# Patient Record
Sex: Female | Born: 1960 | Race: White | Hispanic: No | Marital: Married | Smoking: Never smoker
Health system: Southern US, Community
[De-identification: ages and names within clinical notes are randomized; demographics above are authoritative.]

## PROBLEM LIST (undated history)

## (undated) DIAGNOSIS — M75102 Unspecified rotator cuff tear or rupture of left shoulder, not specified as traumatic: Secondary | ICD-10-CM

## (undated) DIAGNOSIS — G4700 Insomnia, unspecified: Secondary | ICD-10-CM

## (undated) DIAGNOSIS — K589 Irritable bowel syndrome without diarrhea: Secondary | ICD-10-CM

## (undated) DIAGNOSIS — G629 Polyneuropathy, unspecified: Secondary | ICD-10-CM

## (undated) DIAGNOSIS — F1120 Opioid dependence, uncomplicated: Secondary | ICD-10-CM

## (undated) DIAGNOSIS — F1920 Other psychoactive substance dependence, uncomplicated: Secondary | ICD-10-CM

## (undated) DIAGNOSIS — T7840XA Allergy, unspecified, initial encounter: Secondary | ICD-10-CM

## (undated) DIAGNOSIS — Z9889 Other specified postprocedural states: Secondary | ICD-10-CM

## (undated) DIAGNOSIS — E785 Hyperlipidemia, unspecified: Secondary | ICD-10-CM

## (undated) DIAGNOSIS — R000 Tachycardia, unspecified: Secondary | ICD-10-CM

## (undated) DIAGNOSIS — G252 Other specified forms of tremor: Secondary | ICD-10-CM

## (undated) DIAGNOSIS — E1143 Type 2 diabetes mellitus with diabetic autonomic (poly)neuropathy: Secondary | ICD-10-CM

## (undated) DIAGNOSIS — I951 Orthostatic hypotension: Secondary | ICD-10-CM

## (undated) DIAGNOSIS — E041 Nontoxic single thyroid nodule: Secondary | ICD-10-CM

## (undated) DIAGNOSIS — Z9289 Personal history of other medical treatment: Secondary | ICD-10-CM

## (undated) DIAGNOSIS — R251 Tremor, unspecified: Secondary | ICD-10-CM

## (undated) DIAGNOSIS — G2401 Drug induced subacute dyskinesia: Secondary | ICD-10-CM

## (undated) DIAGNOSIS — M47812 Spondylosis without myelopathy or radiculopathy, cervical region: Secondary | ICD-10-CM

## (undated) DIAGNOSIS — I498 Other specified cardiac arrhythmias: Secondary | ICD-10-CM

## (undated) DIAGNOSIS — Z5189 Encounter for other specified aftercare: Secondary | ICD-10-CM

## (undated) DIAGNOSIS — G20 Parkinson's disease: Secondary | ICD-10-CM

## (undated) DIAGNOSIS — G90A Postural orthostatic tachycardia syndrome (POTS): Secondary | ICD-10-CM

## (undated) DIAGNOSIS — R259 Unspecified abnormal involuntary movements: Secondary | ICD-10-CM

## (undated) DIAGNOSIS — G249 Dystonia, unspecified: Secondary | ICD-10-CM

## (undated) DIAGNOSIS — M5410 Radiculopathy, site unspecified: Secondary | ICD-10-CM

## (undated) DIAGNOSIS — F329 Major depressive disorder, single episode, unspecified: Secondary | ICD-10-CM

## (undated) DIAGNOSIS — M47819 Spondylosis without myelopathy or radiculopathy, site unspecified: Secondary | ICD-10-CM

## (undated) DIAGNOSIS — F32A Depression, unspecified: Secondary | ICD-10-CM

## (undated) DIAGNOSIS — Z90711 Acquired absence of uterus with remaining cervical stump: Secondary | ICD-10-CM

## (undated) DIAGNOSIS — F419 Anxiety disorder, unspecified: Secondary | ICD-10-CM

## (undated) DIAGNOSIS — E042 Nontoxic multinodular goiter: Secondary | ICD-10-CM

## (undated) DIAGNOSIS — F319 Bipolar disorder, unspecified: Secondary | ICD-10-CM

## (undated) DIAGNOSIS — G20C Parkinsonism, unspecified: Secondary | ICD-10-CM

## (undated) DIAGNOSIS — G901 Familial dysautonomia [Riley-Day]: Secondary | ICD-10-CM

## (undated) HISTORY — DX: Drug induced subacute dyskinesia: G24.01

## (undated) HISTORY — DX: Other psychoactive substance dependence, uncomplicated: F19.20

## (undated) HISTORY — DX: Acquired absence of uterus with remaining cervical stump: Z90.711

## (undated) HISTORY — DX: Familial dysautonomia (riley-day): G90.1

## (undated) HISTORY — DX: Spondylosis without myelopathy or radiculopathy, cervical region: M47.812

## (undated) HISTORY — DX: Unspecified rotator cuff tear or rupture of left shoulder, not specified as traumatic: M75.102

## (undated) HISTORY — DX: Spondylosis without myelopathy or radiculopathy, site unspecified: M47.819

## (undated) HISTORY — DX: Encounter for other specified aftercare: Z51.89

## (undated) HISTORY — DX: Radiculopathy, site unspecified: M54.10

## (undated) HISTORY — DX: Orthostatic hypotension: I95.1

## (undated) HISTORY — DX: Parkinsonism, unspecified: G20.C

## (undated) HISTORY — PX: BREAST LUMPECTOMY: SHX2

## (undated) HISTORY — DX: Personal history of other medical treatment: Z92.89

## (undated) HISTORY — PX: SHOULDER SURGERY: SHX246

## (undated) HISTORY — DX: Anxiety disorder, unspecified: F41.9

## (undated) HISTORY — DX: Postural orthostatic tachycardia syndrome (POTS): G90.A

## (undated) HISTORY — DX: Nontoxic single thyroid nodule: E04.1

## (undated) HISTORY — DX: Depression, unspecified: F32.A

## (undated) HISTORY — DX: Other specified cardiac arrhythmias: I49.8

## (undated) HISTORY — DX: Hyperlipidemia, unspecified: E78.5

## (undated) HISTORY — DX: Tachycardia, unspecified: R00.0

## (undated) HISTORY — DX: Type 2 diabetes mellitus with diabetic autonomic (poly)neuropathy: E11.43

## (undated) HISTORY — DX: Polyneuropathy, unspecified: G62.9

## (undated) HISTORY — DX: Nontoxic multinodular goiter: E04.2

## (undated) HISTORY — DX: Parkinson's disease: G20

## (undated) HISTORY — DX: Tremor, unspecified: R25.1

## (undated) HISTORY — DX: Other specified forms of tremor: G25.2

## (undated) HISTORY — DX: Dystonia, unspecified: G24.9

## (undated) HISTORY — DX: Allergy, unspecified, initial encounter: T78.40XA

## (undated) HISTORY — PX: BREAST EXCISIONAL BIOPSY: SUR124

## (undated) HISTORY — DX: Opioid dependence, uncomplicated: F11.20

## (undated) HISTORY — PX: PARTIAL HYSTERECTOMY: SHX80

## (undated) HISTORY — DX: Irritable bowel syndrome without diarrhea: K58.9

## (undated) HISTORY — PX: OOPHORECTOMY: SHX86

## (undated) HISTORY — DX: Insomnia, unspecified: G47.00

## (undated) HISTORY — DX: Bipolar disorder, unspecified: F31.9

## (undated) HISTORY — DX: Unspecified abnormal involuntary movements: R25.9

## (undated) HISTORY — DX: Major depressive disorder, single episode, unspecified: F32.9

## (undated) HISTORY — PX: OTHER SURGICAL HISTORY: SHX169

---

## 1990-02-10 HISTORY — PX: OTHER SURGICAL HISTORY: SHX169

## 1997-08-16 ENCOUNTER — Ambulatory Visit (HOSPITAL_COMMUNITY): Admission: RE | Admit: 1997-08-16 | Discharge: 1997-08-16 | Payer: Self-pay | Admitting: *Deleted

## 1998-08-23 ENCOUNTER — Other Ambulatory Visit: Admission: RE | Admit: 1998-08-23 | Discharge: 1998-08-23 | Payer: Self-pay | Admitting: Obstetrics and Gynecology

## 2000-02-11 HISTORY — PX: ACROMIOPLASTY: SHX131

## 2000-02-21 ENCOUNTER — Encounter: Payer: Self-pay | Admitting: Emergency Medicine

## 2000-02-21 ENCOUNTER — Emergency Department (HOSPITAL_COMMUNITY): Admission: EM | Admit: 2000-02-21 | Discharge: 2000-02-21 | Payer: Self-pay | Admitting: Emergency Medicine

## 2000-02-24 ENCOUNTER — Encounter: Admission: RE | Admit: 2000-02-24 | Discharge: 2000-02-24 | Payer: Self-pay | Admitting: Family Medicine

## 2000-02-24 ENCOUNTER — Encounter: Payer: Self-pay | Admitting: Family Medicine

## 2000-03-18 ENCOUNTER — Other Ambulatory Visit: Admission: RE | Admit: 2000-03-18 | Discharge: 2000-03-18 | Payer: Self-pay | Admitting: *Deleted

## 2000-03-26 ENCOUNTER — Ambulatory Visit (HOSPITAL_COMMUNITY): Admission: RE | Admit: 2000-03-26 | Discharge: 2000-03-26 | Payer: Self-pay | Admitting: *Deleted

## 2000-07-09 ENCOUNTER — Observation Stay: Admission: RE | Admit: 2000-07-09 | Discharge: 2000-07-10 | Payer: Self-pay | Admitting: Orthopedic Surgery

## 2001-04-20 ENCOUNTER — Emergency Department (HOSPITAL_COMMUNITY): Admission: EM | Admit: 2001-04-20 | Discharge: 2001-04-20 | Payer: Self-pay

## 2001-07-01 ENCOUNTER — Other Ambulatory Visit: Admission: RE | Admit: 2001-07-01 | Discharge: 2001-07-01 | Payer: Self-pay | Admitting: Obstetrics & Gynecology

## 2002-08-30 ENCOUNTER — Other Ambulatory Visit: Admission: RE | Admit: 2002-08-30 | Discharge: 2002-08-30 | Payer: Self-pay | Admitting: Family Medicine

## 2003-11-22 ENCOUNTER — Emergency Department (HOSPITAL_COMMUNITY): Admission: EM | Admit: 2003-11-22 | Discharge: 2003-11-22 | Payer: Self-pay | Admitting: Emergency Medicine

## 2005-07-24 ENCOUNTER — Ambulatory Visit: Payer: Self-pay | Admitting: Family Medicine

## 2005-09-04 ENCOUNTER — Ambulatory Visit: Payer: Self-pay | Admitting: Family Medicine

## 2005-09-04 ENCOUNTER — Other Ambulatory Visit: Admission: RE | Admit: 2005-09-04 | Discharge: 2005-09-04 | Payer: Self-pay | Admitting: Family Medicine

## 2005-09-05 ENCOUNTER — Encounter: Payer: Self-pay | Admitting: Family Medicine

## 2005-09-05 LAB — CONVERTED CEMR LAB: Pap Smear: NEGATIVE

## 2005-09-23 ENCOUNTER — Encounter: Admission: RE | Admit: 2005-09-23 | Discharge: 2005-09-23 | Payer: Self-pay | Admitting: Family Medicine

## 2006-01-21 ENCOUNTER — Ambulatory Visit: Payer: Self-pay | Admitting: Family Medicine

## 2006-04-15 ENCOUNTER — Ambulatory Visit: Payer: Self-pay | Admitting: Family Medicine

## 2006-06-11 ENCOUNTER — Ambulatory Visit: Payer: Self-pay | Admitting: Family Medicine

## 2006-10-21 ENCOUNTER — Encounter: Payer: Self-pay | Admitting: Family Medicine

## 2006-12-01 ENCOUNTER — Encounter: Payer: Self-pay | Admitting: Family Medicine

## 2006-12-02 ENCOUNTER — Ambulatory Visit: Payer: Self-pay | Admitting: Family Medicine

## 2006-12-02 DIAGNOSIS — F988 Other specified behavioral and emotional disorders with onset usually occurring in childhood and adolescence: Secondary | ICD-10-CM | POA: Insufficient documentation

## 2006-12-02 DIAGNOSIS — E785 Hyperlipidemia, unspecified: Secondary | ICD-10-CM | POA: Insufficient documentation

## 2006-12-02 DIAGNOSIS — F331 Major depressive disorder, recurrent, moderate: Secondary | ICD-10-CM | POA: Insufficient documentation

## 2006-12-02 DIAGNOSIS — E109 Type 1 diabetes mellitus without complications: Secondary | ICD-10-CM | POA: Insufficient documentation

## 2007-01-19 ENCOUNTER — Telehealth: Payer: Self-pay | Admitting: Family Medicine

## 2007-01-21 ENCOUNTER — Telehealth (INDEPENDENT_AMBULATORY_CARE_PROVIDER_SITE_OTHER): Payer: Self-pay | Admitting: *Deleted

## 2007-01-27 ENCOUNTER — Encounter: Admission: RE | Admit: 2007-01-27 | Discharge: 2007-01-27 | Payer: Self-pay | Admitting: Family Medicine

## 2007-02-09 ENCOUNTER — Encounter: Payer: Self-pay | Admitting: Family Medicine

## 2007-02-09 ENCOUNTER — Telehealth: Payer: Self-pay | Admitting: Family Medicine

## 2007-03-03 ENCOUNTER — Telehealth: Payer: Self-pay | Admitting: Family Medicine

## 2007-03-04 ENCOUNTER — Ambulatory Visit: Payer: Self-pay | Admitting: Family Medicine

## 2007-03-08 ENCOUNTER — Encounter: Payer: Self-pay | Admitting: Family Medicine

## 2007-03-22 ENCOUNTER — Ambulatory Visit: Payer: Self-pay | Admitting: Family Medicine

## 2007-04-06 ENCOUNTER — Encounter: Payer: Self-pay | Admitting: Family Medicine

## 2007-04-20 ENCOUNTER — Encounter: Payer: Self-pay | Admitting: Family Medicine

## 2007-05-27 ENCOUNTER — Ambulatory Visit: Payer: Self-pay | Admitting: Family Medicine

## 2007-05-28 ENCOUNTER — Encounter: Payer: Self-pay | Admitting: Family Medicine

## 2007-06-08 ENCOUNTER — Telehealth: Payer: Self-pay | Admitting: Family Medicine

## 2007-06-15 LAB — CONVERTED CEMR LAB
Amphetamine Screen, Ur: NEGATIVE
Barbiturate Quant, Ur: NEGATIVE
Cocaine Metabolites: NEGATIVE
Ethyl Alcohol: <5 mg/dL (ref <10)
Marijuana Metabolite: NEGATIVE
Opiates: NEGATIVE

## 2007-07-06 ENCOUNTER — Ambulatory Visit: Payer: Self-pay | Admitting: Family Medicine

## 2007-07-28 ENCOUNTER — Telehealth: Payer: Self-pay | Admitting: Family Medicine

## 2007-10-28 ENCOUNTER — Telehealth: Payer: Self-pay | Admitting: Family Medicine

## 2007-11-11 ENCOUNTER — Ambulatory Visit: Payer: Self-pay | Admitting: Family Medicine

## 2007-11-11 DIAGNOSIS — F411 Generalized anxiety disorder: Secondary | ICD-10-CM | POA: Insufficient documentation

## 2008-02-08 ENCOUNTER — Telehealth: Payer: Self-pay | Admitting: Family Medicine

## 2008-03-02 ENCOUNTER — Telehealth: Payer: Self-pay | Admitting: Family Medicine

## 2008-03-06 ENCOUNTER — Telehealth: Payer: Self-pay | Admitting: Family Medicine

## 2008-04-24 ENCOUNTER — Encounter: Payer: Self-pay | Admitting: Family Medicine

## 2008-04-25 ENCOUNTER — Telehealth: Payer: Self-pay | Admitting: Family Medicine

## 2008-05-25 ENCOUNTER — Ambulatory Visit: Payer: Self-pay | Admitting: Family Medicine

## 2008-06-06 ENCOUNTER — Telehealth (INDEPENDENT_AMBULATORY_CARE_PROVIDER_SITE_OTHER): Payer: Self-pay | Admitting: *Deleted

## 2008-06-07 ENCOUNTER — Telehealth: Payer: Self-pay | Admitting: Family Medicine

## 2008-07-18 ENCOUNTER — Ambulatory Visit: Payer: Self-pay | Admitting: Family Medicine

## 2008-07-18 DIAGNOSIS — F1921 Other psychoactive substance dependence, in remission: Secondary | ICD-10-CM | POA: Insufficient documentation

## 2008-08-08 ENCOUNTER — Telehealth (INDEPENDENT_AMBULATORY_CARE_PROVIDER_SITE_OTHER): Payer: Self-pay | Admitting: *Deleted

## 2008-10-12 ENCOUNTER — Telehealth: Payer: Self-pay | Admitting: Family Medicine

## 2008-10-19 ENCOUNTER — Ambulatory Visit: Payer: Self-pay | Admitting: Family Medicine

## 2008-11-09 ENCOUNTER — Telehealth (INDEPENDENT_AMBULATORY_CARE_PROVIDER_SITE_OTHER): Payer: Self-pay | Admitting: *Deleted

## 2008-11-10 ENCOUNTER — Encounter: Payer: Self-pay | Admitting: Family Medicine

## 2008-11-14 ENCOUNTER — Telehealth: Payer: Self-pay | Admitting: Family Medicine

## 2008-11-30 ENCOUNTER — Telehealth: Payer: Self-pay | Admitting: Family Medicine

## 2008-12-29 ENCOUNTER — Emergency Department (HOSPITAL_COMMUNITY): Admission: EM | Admit: 2008-12-29 | Discharge: 2008-12-29 | Payer: Self-pay | Admitting: Emergency Medicine

## 2009-03-20 ENCOUNTER — Ambulatory Visit: Payer: Self-pay | Admitting: Family Medicine

## 2009-03-20 DIAGNOSIS — G629 Polyneuropathy, unspecified: Secondary | ICD-10-CM | POA: Insufficient documentation

## 2009-03-20 DIAGNOSIS — E1149 Type 2 diabetes mellitus with other diabetic neurological complication: Secondary | ICD-10-CM

## 2009-03-20 DIAGNOSIS — F1920 Other psychoactive substance dependence, uncomplicated: Secondary | ICD-10-CM | POA: Insufficient documentation

## 2009-04-02 ENCOUNTER — Telehealth: Payer: Self-pay | Admitting: Family Medicine

## 2009-04-10 ENCOUNTER — Ambulatory Visit: Payer: Self-pay | Admitting: Family Medicine

## 2009-04-12 ENCOUNTER — Encounter: Payer: Self-pay | Admitting: Family Medicine

## 2009-04-12 ENCOUNTER — Telehealth: Payer: Self-pay | Admitting: Family Medicine

## 2009-05-08 ENCOUNTER — Telehealth: Payer: Self-pay | Admitting: Family Medicine

## 2009-07-17 ENCOUNTER — Encounter: Payer: Self-pay | Admitting: Family Medicine

## 2009-07-18 ENCOUNTER — Ambulatory Visit: Payer: Self-pay | Admitting: Family Medicine

## 2009-07-19 ENCOUNTER — Telehealth: Payer: Self-pay | Admitting: Family Medicine

## 2009-07-24 ENCOUNTER — Encounter: Payer: Self-pay | Admitting: Family Medicine

## 2009-07-26 ENCOUNTER — Telehealth: Payer: Self-pay | Admitting: Family Medicine

## 2009-08-27 ENCOUNTER — Encounter: Payer: Self-pay | Admitting: Family Medicine

## 2009-09-05 ENCOUNTER — Telehealth: Payer: Self-pay | Admitting: Family Medicine

## 2009-09-18 ENCOUNTER — Ambulatory Visit: Payer: Self-pay | Admitting: Family Medicine

## 2009-09-18 DIAGNOSIS — T25229A Burn of second degree of unspecified foot, initial encounter: Secondary | ICD-10-CM | POA: Insufficient documentation

## 2009-09-22 ENCOUNTER — Encounter: Payer: Self-pay | Admitting: Family Medicine

## 2009-09-26 ENCOUNTER — Telehealth: Payer: Self-pay | Admitting: Family Medicine

## 2009-09-26 ENCOUNTER — Encounter: Payer: Self-pay | Admitting: Family Medicine

## 2009-12-11 HISTORY — PX: THYROID LOBECTOMY: SHX420

## 2010-01-16 ENCOUNTER — Telehealth: Payer: Self-pay | Admitting: Family Medicine

## 2010-01-29 ENCOUNTER — Telehealth: Payer: Self-pay | Admitting: Family Medicine

## 2010-01-31 ENCOUNTER — Encounter: Payer: Self-pay | Admitting: Family Medicine

## 2010-02-08 ENCOUNTER — Ambulatory Visit
Admission: RE | Admit: 2010-02-08 | Discharge: 2010-02-08 | Payer: Self-pay | Source: Home / Self Care | Attending: Internal Medicine | Admitting: Internal Medicine

## 2010-02-08 DIAGNOSIS — M5412 Radiculopathy, cervical region: Secondary | ICD-10-CM | POA: Insufficient documentation

## 2010-02-11 ENCOUNTER — Emergency Department (HOSPITAL_COMMUNITY)
Admission: EM | Admit: 2010-02-11 | Discharge: 2010-02-11 | Payer: Self-pay | Source: Home / Self Care | Admitting: Emergency Medicine

## 2010-02-12 ENCOUNTER — Encounter: Payer: Self-pay | Admitting: Family Medicine

## 2010-02-12 ENCOUNTER — Telehealth: Payer: Self-pay | Admitting: Family Medicine

## 2010-02-12 DIAGNOSIS — E041 Nontoxic single thyroid nodule: Secondary | ICD-10-CM | POA: Insufficient documentation

## 2010-02-13 ENCOUNTER — Encounter
Admission: RE | Admit: 2010-02-13 | Discharge: 2010-02-13 | Payer: Self-pay | Source: Home / Self Care | Attending: Family Medicine | Admitting: Family Medicine

## 2010-02-14 ENCOUNTER — Encounter: Admit: 2010-02-14 | Payer: Self-pay | Admitting: Internal Medicine

## 2010-02-14 ENCOUNTER — Other Ambulatory Visit: Payer: Self-pay | Admitting: Internal Medicine

## 2010-02-14 ENCOUNTER — Ambulatory Visit
Admission: RE | Admit: 2010-02-14 | Discharge: 2010-02-14 | Payer: Self-pay | Source: Home / Self Care | Attending: Internal Medicine | Admitting: Internal Medicine

## 2010-02-15 ENCOUNTER — Telehealth: Payer: Self-pay

## 2010-02-15 ENCOUNTER — Telehealth: Payer: Self-pay | Admitting: Internal Medicine

## 2010-02-15 LAB — T4, FREE: Free T4: 1.16 ng/dL (ref 0.60–1.60)

## 2010-02-15 LAB — TSH: TSH: 3.01 u[IU]/mL (ref 0.35–5.50)

## 2010-02-28 ENCOUNTER — Encounter
Admission: RE | Admit: 2010-02-28 | Discharge: 2010-02-28 | Payer: Self-pay | Source: Home / Self Care | Attending: Family Medicine | Admitting: Family Medicine

## 2010-02-28 LAB — HM MAMMOGRAPHY: HM Mammogram: NEGATIVE

## 2010-03-06 ENCOUNTER — Other Ambulatory Visit
Admission: RE | Admit: 2010-03-06 | Discharge: 2010-03-06 | Payer: Self-pay | Source: Home / Self Care | Admitting: Interventional Radiology

## 2010-03-06 ENCOUNTER — Other Ambulatory Visit: Payer: Self-pay | Admitting: Interventional Radiology

## 2010-03-06 ENCOUNTER — Encounter
Admission: RE | Admit: 2010-03-06 | Discharge: 2010-03-06 | Payer: Self-pay | Source: Home / Self Care | Attending: Endocrinology | Admitting: Endocrinology

## 2010-03-12 NOTE — Letter (Signed)
Summary: [facility]  [facility]   Imported By: Maryln Gottron 09/03/2009 12:16:52  _____________________________________________________________________  External Attachment:    Type:   Image     Comment:   External Document
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
## 2010-03-12 NOTE — Progress Notes (Signed)
Summary: prozac upped & out  Phone Note Call from Patient Call back at [phone]   Summary of Call: For 3 weeks taking 80mg  Prozac instead of 60mg .  Feeling much better, but out of med early.  Need generic Prozac 20mg .  [facility]    Initial call taken by: Rudy Jew, RN,  May 08, 2009 11:59 AM  Follow-up for Phone Call        per dr Scotty Court he told her to take prozac 40mg  two times a day  Follow-up by: Pura Spice, RN,  May 08, 2009 4:57 PM    New/Updated Medications: PROZAC 40 MG CAPS (FLUOXETINE HCL) 1 by mouth two times a day Prescriptions: PROZAC 40 MG CAPS (FLUOXETINE HCL) 1 by mouth two times a day  #60 x 3   Entered by:   Pura Spice, RN   Authorized by:   Judithann Sheen MD   Signed by:   Pura Spice, RN on 05/08/2009   Method used:   [facility] [address] [id number]       [address]       [place], [place]  [address]       Ph: [phone] or [phone]       Fax: [phone]   RxID:   [id number]

## 2010-03-12 NOTE — Letter (Signed)
Summary: Generic Letter  [place] at [facility]  [address] [place], [place] [address]   Phone: [phone]  Fax: [phone]    04/12/2009  DAVEY BERGSMA [address] [place], [place]  [address]  To Whom It May Concern:  Ciarra Braddy is under my care for diabetes, neuropathy and weakness with pain. At times her medical condition may be severe enough that patient may need to sit down for a period of time. Thank you for your consideration in this matter. If any furhter information is needed please don't hestitate to notify me.            Sincerely,       WR Alphonzo Severance. MD FAAFP

## 2010-03-12 NOTE — Letter (Signed)
Summary: Generic Letter  [place] at [facility]  [address] [place], [place] [address]   Phone: [phone]  Fax: [phone]    07/24/2009    To Whom It May Concern:   Please allow my patient, Lynn Barnes to keep her insulin and needles, syringes with her at all times. It is medically necessary that she be allowed to do so. If you have any questions or concerns please don't hestitate to contact me.           Sincerely,        Dr Gwenyth Bender Stafford,MD

## 2010-03-12 NOTE — Letter (Signed)
Summary: Eye Exam/[facility]  Eye Exam/[facility]   Imported By: Maryln Gottron 10/09/2009 13:31:00  _____________________________________________________________________  External Attachment:    Type:   Image     Comment:   External Document

## 2010-03-12 NOTE — Assessment & Plan Note (Signed)
Summary: FMLA Form Completion // RS   Vital Signs:  Patient profile:   50 year old female Weight:      165 pounds BMI:     25.55 O2 Sat:      96 % Temp:     98 degrees F Pulse rate:   120 / minute BP sitting:   140 / 80  (left arm)  Vitals Entered By: Pura Spice, RN (April 10, 2009 1:01 PM) CC: needs FMLA papers completed.  Is Patient Diabetic? Yes Did you bring your meter with you today? 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 She is on continued medication for chronic depression, as well as a medication for drug dependency on narcotic she has femoral diabetic neuropathy. He has history of alternating diarrhea and constipation, or BS. She has past history of an ulcer which has been well controlled and is a younger female she hasn't moved her rectum and up which we need her know that reason. We'll fill out the forms today patient also has ADD  Allergies: 1)  ! Sulfa 2)  ! Nsaids 3)  ! 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 On therapy by psychiatrist using sebutex  Risk Factors: Smoking Status: never (03/20/2009)  Past Surgical History: oophorectomy  Review of Systems  The patient denies anorexia, fever, weight loss, weight gain, vision loss, decreased hearing, hoarseness, chest pain, syncope, dyspnea on exertion, peripheral edema, prolonged cough, headaches, hemoptysis, abdominal pain, melena, hematochezia, severe indigestion/heartburn, hematuria, incontinence, genital sores, muscle weakness, suspicious skin lesions, transient  blindness, difficulty walking, depression, unusual weight change, abnormal bleeding, enlarged lymph nodes, angioedema, breast masses, and testicular masses.    Physical Exam  General:  Well-developed,well-nourished,in no acute distress; alert,appropriate and cooperative throughout examination Head:  Normocephalic and atraumatic without obvious abnormalities. No apparent alopecia or balding. Eyes:  No corneal or conjunctival inflammation noted. EOMI. Perrla. Funduscopic exam benign, without hemorrhages, exudates or papilledema. Vision grossly normal. Ears:  External ear exam shows no significant lesions or deformities.  Otoscopic examination reveals clear canals, tympanic membranes are intact bilaterally without bulging, retraction, inflammation or discharge. Hearing is grossly normal bilaterally. Nose:  External nasal examination shows no deformity or inflammation. Nasal mucosa are pink and moist without lesions or exudates. Mouth:  Oral mucosa and oropharynx without lesions or exudates.  Teeth in good repair. Neck:  No deformities, masses, or tenderness noted. Chest Wall:  No deformities, masses, or tenderness noted. Lungs:  Normal respiratory effort, chest expands symmetrically. Lungs are clear to auscultation, no crackles or wheezes. Heart:  Normal rate and regular rhythm. S1 and S2 normal without gallop, murmur, click, rub or other extra sounds. Abdomen:  insulin pump in place, otherwise negative exam Rectal:  not examined Genitalia:  not examined Msk:  No deformity or scoliosis noted of thoracic or lumbar spine.   Pulses:  R and L carotid,radial,femoral,dorsalis pedis and posterior tibial pulses are full and equal bilaterally Extremities:  No clubbing, cyanosis, edema, or deformity noted with normal full range of motion of all joints.   Neurologic:  decreased sensation to touch over both  lower extremities Skin:  Intact without suspicious lesions or rashes Cervical Nodes:  No  lymphadenopathy noted Axillary Nodes:  No palpable lymphadenopathy Inguinal Nodes:  No significant adenopathy Psych:  depressed affect.     Impression & Recommendations:  Problem # 1:  DIABETIC PERIPHERAL NEUROPATHY (ICD-250.60) Assessment Unchanged  Problem # 2:  DRUG DEPENDENCE (ICD-304.90) Assessment: Unchanged  Problem # 3:  ANXIETY (ICD-300.00) Assessment: Unchanged  Her updated medication list for this problem includes:    Prozac 20 Mg Caps (Fluoxetine hcl) .Marland KitchenMarland KitchenMarland Kitchen. 3 qd    Cymbalta 60 Mg Cpep (Duloxetine hcl) .[name]... 1 by mouth once daily    Lorazepam 1 Mg Tabs (Lorazepam) .[name]... 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 3 qd 2)  Omnipod Misc (Insulin pump disposable) .... As per endo 3)  Lipitor 20 Mg Tabs (Atorvastatin calcium) .... Take 1 tablet by mouth at bedtime 4)  Risperdal 3 Mg Tabs (Risperidone) .... 2 qd 5)  Cymbalta 60 Mg Cpep (Duloxetine hcl) .[name].. 1 by mouth once daily 6)  Lorazepam 1 Mg Tabs (Lorazepam) .[name].. 1 three times a day ac anfd 2 hs for anxiety and stress, only refill on schedule, not over 5 per day  Patient Instructions: 1)  has completed FMLA forms 2)  Continue regular medical treatment

## 2010-03-12 NOTE — Assessment & Plan Note (Signed)
Summary: F/U ON DEPRESSION // RS   Vital Signs:  Patient profile:   50 year old female Weight:      154 pounds O2 Sat:      96 % Temp:     98.3 degrees F Pulse rate:   119 / minute Pulse rhythm:   regular BP sitting:   110 / 78  (left arm)  Vitals Entered By: Pura Spice, RN (July 18, 2009 4:01 PM) CC: f/u depression not taking lorazepam  wants note for airport she has needles for insulin . Wants rx for nausea. Dr Althemier thinks you should increase her Lipitor. he did labs on her.    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 for her ADD and depression Dr. Kyra Searles had discontinued her pump and changed treatment for her diabetes Lantus 30 units plus Apidra as needed Patient is planning to go to [place] next week and will likely a prescription for nausea as well as antibiotic to prevent diarrhea. Also needs a letter complaining why she has syringes and needles Gen. pleasant and doing better  Allergies: 1)  ! Sulfa 2)  ! Nsaids 3)  ! 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 On therapy by psychiatrist using sebutex  Past Surgical History: Last updated: 04/10/2009 oophorectomy  Risk Factors: Smoking Status: never (03/20/2009)  Review of Systems      See HPI  The patient denies anorexia, fever, weight loss, weight gain, vision loss, decreased hearing, hoarseness, chest pain, syncope, dyspnea on exertion, peripheral edema, prolonged cough, headaches, hemoptysis, abdominal pain, melena, hematochezia, severe indigestion/heartburn,  hematuria, incontinence, genital sores, muscle weakness, suspicious skin lesions, transient blindness, difficulty walking, depression, unusual weight change, abnormal bleeding, enlarged lymph nodes, angioedema, breast masses, and testicular masses.    Physical Exam  General:  Well-developed,well-nourished,in no acute distress; alert,appropriate and cooperative throughout examination Lungs:  Normal respiratory effort, chest expands symmetrically. Lungs are clear to auscultation, no crackles or wheezes. Heart:  Normal rate and regular rhythm. S1 and S2 normal without gallop, murmur, click, rub or other extra sounds. Msk:  No deformity or scoliosis noted of thoracic or lumbar spine.   Extremities:  no edema Neurologic:  No cranial nerve deficits noted. Station and gait are normal. Plantar reflexes are down-going bilaterally. DTRs are symmetrical throughout. Sensory, motor and coordinative functions appear intact. Psych:  Oriented X3, not anxious appearing, and not depressed appearing.     Impression & Recommendations:  Problem # 1:  DIABETIC PERIPHERAL NEUROPATHY (ICD-250.60) Assessment Unchanged  Her updated medication list for this problem includes:    Lantus Solostar 100 Unit/ml Soln (Insulin glargine) .Marland KitchenMarland KitchenMarland KitchenMarland Kitchen 30 units dr altheimer    Jones Skene Solostar 100 Unit/ml Soln (Insulin glulisine) .[name]... As needed  dr altheimeir  Problem # 2:  UNSPECIFIED DRUG DEPENDENCE IN REMISSION (ICD-304.93) Assessment: Improved  Problem # 3:  ANXIETY (ICD-300.00) Assessment: Improved  The following medications were removed from the medication list:    Cymbalta 60 Mg Cpep (Duloxetine hcl) .[name]... 1 by mouth once daily    Lorazepam 1 Mg Tabs (Lorazepam) .[name]... 1 three times a day ac anfd 2  hs for anxiety and stress, only refill on schedule, not over 5 per day Her updated medication list for this problem includes:    Prozac 40 Mg Caps (Fluoxetine hcl) .[name]... 1 by mouth two times a day  Problem # 4:  HYPERLIPIDEMIA  (ICD-272.4) Assessment: Improved  The following medications were removed from the medication list:    Lipitor 20 Mg Tabs (Atorvastatin calcium) .[name]... Take 1 tablet by mouth at bedtime Her updated medication list for this problem includes:    Lipitor 40 Mg Tabs (Atorvastatin calcium) .[name]... 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 2 qd 2)  Prozac 40 Mg Caps (Fluoxetine hcl) .[name].. 1 by mouth two times a day 3)  Lantus Solostar 100 Unit/ml Soln (Insulin glargine) .... 30 units dr altheimer 4)  Apidra Solostar 100 Unit/ml Soln (Insulin glulisine) .... As needed  dr altheimeir 5)  Ritalin 20 Mg Tabs (Methylphenidate hcl) .[name].. 1 tid 6)  Ritalin 20 Mg Tabs (Methylphenidate hcl) .[name].. 1 three times a day fill 8 july 7)  Ritalin 20 Mg Tabs (Methylphenidate hcl) .[name].. 1 three times a day fill 8 august 8)  Prochlorperazine Maleate 10 Mg Tabs (Prochlorperazine maleate) .[name].. 1 q6h as needed to prevent nausea vomiting 9)  Ciprofloxacin Hcl 500 Mg Tabs (Ciprofloxacin hcl) .[name].. 1 two times a day to prevent diarhea 10)  Lipitor 40 Mg Tabs (Atorvastatin calcium) .[name].. 1 qd 11)  Ciprofloxacin Hcl 500 Mg Tabs (Ciprofloxacin hcl) .[name].. 1 by mouth two times a day to prevent diarrhea 12)  Zolpidem Tartrate 10 Mg Tabs (Zolpidem tartrate) .[name].. 1 by mouth at bedtime for sleep  Patient Instructions: 1)  4 hyperlipidemia have increased Lipitor to 40 mg q. day 2)  Refill and Ritalin for ADD and depression 3)  Have prescribed ciprofloxacin 500 mg b.i.d. and Compazine for your trip to [place] 4)  Lateral oblique type floor on Prescriptions: ZOLPIDEM TARTRATE 10 MG TABS (ZOLPIDEM TARTRATE) 1 by mouth at bedtime for sleep  #30 x 5   Entered and Authorized by:   Judithann Sheen MD   Signed  by:   Judithann Sheen MD on 08/03/2009   Method used:   [facility] ...       [id number]       [address]       [place], [place]  [address]       Ph: [phone] or [phone]       Fax: [phone]   RxID:   [id number] CIPROFLOXACIN HCL 500 MG TABS (CIPROFLOXACIN HCL) 1 by mouth two times a day to prevent diarrhea  #20 x 0   Entered by:   Pura Spice, RN   Authorized by:   Judithann Sheen MD   Signed by:   Pura Spice, RN on 07/24/2009   Method used:   [facility]        [facility]  [id number]       [address]       [place], [place]  [address]       Ph: [phone] or [phone]       Fax: [phone]  RxID:   [id number] LIPITOR 40 MG TABS (ATORVASTATIN CALCIUM) 1 qd  #30 x 11   Entered and Authorized by:   Judithann Sheen MD   Signed by:   Judithann Sheen MD on 07/18/2009   Method used:   [facility]        [facility]  [address] [id number]       [address]       [place], [place]  [address]       Ph: [phone] or [phone]       Fax: [phone]   RxID:   [id number] PROCHLORPERAZINE MALEATE 10 MG TABS (PROCHLORPERAZINE MALEATE) 1 q6h as needed to prevent nausea vomiting  #36 x 1   Entered and Authorized by:   Judithann Sheen MD   Signed by:   Judithann Sheen MD on 07/18/2009   Method used:   [facility]        [facility]  [address] [id number]       [address]       [place], [place]  [address]       Ph: [phone] or [phone]       Fax: [phone]   RxID:   [id number] RITALIN 20 MG TABS (METHYLPHENIDATE HCL) 1 three times a day FILL 8 AUGUST  #90 x 0   Entered and Authorized by:   Judithann Sheen MD   Signed by:   Judithann Sheen MD on 07/18/2009   Method used:   Print then Give to Patient   RxID:   [id number] RITALIN 20 MG TABS  (METHYLPHENIDATE HCL) 1 three times a day FILL 8 JULY  #90 x 0   Entered and Authorized by:   Judithann Sheen MD   Signed by:   Judithann Sheen MD on 07/18/2009   Method used:   Print then Give to Patient   RxID:   [id number] RITALIN 20 MG TABS (METHYLPHENIDATE HCL) 1 TID  #90 x 0   Entered and Authorized by:   Judithann Sheen MD   Signed by:   Judithann Sheen MD on 07/18/2009   Method used:   Print then Give to Patient   RxID:   [id number]

## 2010-03-12 NOTE — Progress Notes (Signed)
Summary: note  Phone Note Call from Patient Call back at [phone]   Caller: vm Summary of Call: Not allowed to wear flipflops to school.  Need note from Dr. Scotty Court that I need to wear the flipflops for first 2 weeks of school until the blisters heal up, otherwise will have to wear regular shoes.  Dr. Satira Sark told me to wear flipflops because of the blisters on my feet.    Initial call taken by: Rudy Jew, RN,  September 26, 2009 1:09 PM  Follow-up for Phone Call        ok pick up tomorrow at fronk desk. Follow-up by: Pura Spice, RN,  September 26, 2009 2:10 PM  Additional Follow-up for Phone Call Additional follow up Details #1::        Phone Call Completed - left message. Additional Follow-up by: Rudy Jew, RN,  September 26, 2009 3:11 PM

## 2010-03-12 NOTE — Letter (Signed)
Summary: Glucose Statistics/[facility]  Glucose Statistics/[facility]   Imported By: Maryln Gottron 10/09/2009 12:26:22  _____________________________________________________________________  External Attachment:    Type:   Image     Comment:   External Document  Appended Document: Glucose Statistics/[facility] labs managed by endo

## 2010-03-12 NOTE — Progress Notes (Signed)
Summary: Pt req refill of Prozac. Pls call in asap.   Phone Note Call from Patient Call back at [facility] Phone [phone]   Caller: Patient Summary of Call: Pt called re: req to get Prozac refilled. Pt is going out of town on Friday and needs to get refill asap. Pt only has 1 pill remaining. [facility] [address]    Initial call taken by: Lucy Antigua,  September 05, 2009 3:51 PM  Follow-up for Phone Call        only on prozac

## 2010-03-12 NOTE — Progress Notes (Signed)
Summary: cipro  Phone Note Call from Patient   Caller: Patient Call For: Judithann Sheen MD Summary of Call: [facility]) Pt states that Dr. Scotty Court was going to give her Cipro yesterday, and she did not get it.  Please send to pharmacy. Initial call taken by: Lynann Beaver CMA,  July 19, 2009 3:47 PM  Follow-up for Phone Call        done and can pick letter up at her convience.  Follow-up by: Pura Spice, RN,  July 24, 2009 1:30 PM

## 2010-03-12 NOTE — Letter (Signed)
Summary: Generic Letter  [place] [facility]  [address] [place], [place] [address]   Phone: [phone]  Fax: [phone]    09/26/2009  To Whom It May Concern:    I am requesting that you allow my patient, Rmani Kapusta, to wear flip-flops or any open toed  shoes for at least the next two weeks until blisters on feet are healed. If you have any questions please feel free to contact me.            Sincerely,      Dr Lacretia Nicks.R.Stafford,MD

## 2010-03-12 NOTE — Assessment & Plan Note (Signed)
Summary: DM NEUROPATHY//CCM   Vital Signs:  Patient profile:   50 year old female Weight:      167 pounds BMI:     25.86 O2 Sat:      95 % on Room air Temp:     97.9 degrees F oral Pulse rate:   120 / minute BP sitting:   118 / 80  (left arm) Cuff size:   regular  Vitals Entered By: Gladis Riffle, RN (March 20, 2009 4:07 PM)  O2 Flow:  Room air CC: discuss DM neuropathy of feet x 2 years Is Patient Diabetic? Yes Did you bring your meter with you today? No Comments CBGs 150-175 at home   History of Present Illness: this 50 year old diabetic has been withdrawn from sebutex which was initiated by Dr. 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 every 6 hours with poor results. She will question of a medication for anxiety we have discussed trying lorazepam 1 mg orally mid afternoon and 2 at bedtime and this is only until she can return to see Dr. Betti Cruz or the counselor Arabella Merles She i is seeing  endocrinologist for treatment of her diabetes,: Insulin pump She has many medication from her, continues to take some Ultram for diabetic neuropathy and depression we spent 20-25 minutes discussing all her problems  Preventive Screening-Counseling & Management  Alcohol-Tobacco     Smoking Status: never  Current Medications (verified): 1)  Prozac 20 Mg  Caps (Fluoxetine Hcl) .... 3 Qd 2)  Omnipod  Misc (Insulin Pump Disposable) .... As Per Endo 3)  Lipitor 20 Mg Tabs (Atorvastatin Calcium) .... Take 1 Tablet By Mouth At Bedtime 4)  Risperdal 3 Mg Tabs (Risperidone) .... 2 Qd 5)  Cymbalta 60 Mg Cpep (Duloxetine Hcl) .[name].. 1 By Mouth Once Daily  Allergies: 1)  ! Sulfa 2)  ! Nsaids 3)  ! 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    Physical Exam  General:  Well-developed,well-nourished,in no acute distress; alert,appropriate and cooperative throughout examinationoverweight-appearing.   Lungs:  Normal respiratory effort, chest expands symmetrically. Lungs are clear to auscultation, no crackles or wheezes. Heart:  Normal rate and regular rhythm. S1 and S2 normal without gallop, murmur, click, rub or other extra sounds.   Impression & Recommendations:  Problem # 1:  DRUG DEPENDENCE (ICD-304.90) Assessment Deteriorated  Problem # 2:  ANXIETY (ICD-300.00) Assessment: Deteriorated  Her updated medication list for this problem includes:    Prozac 20 Mg Caps (Fluoxetine hcl) .Marland KitchenMarland KitchenMarland Kitchen. 3 qd    Cymbalta 60 Mg Cpep (Duloxetine hcl) .[name]... 1 by mouth once daily    Lorazepam 1 Mg Tabs (Lorazepam) .[name]... 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 3 qd 2)  Omnipod Misc (Insulin pump disposable) .... As per endo 3)  Lipitor 20 Mg Tabs (Atorvastatin calcium)  .... Take 1 tablet by mouth at bedtime 4)  Risperdal 3 Mg Tabs (Risperidone) .... 2 qd 5)  Cymbalta 60 Mg Cpep (Duloxetine hcl) .[name].. 1 by mouth once daily 6)  Lorazepam 1 Mg Tabs (Lorazepam) .[name].. 1 three times a day ac anfd 2 hs for anxiety and stress, only refill on schedule, not over 5 per day  Patient Instructions: 1)  Since you have been 15 days off Sebutex and now haqving considerable stress will treat with 2)  Lorazepam 1 mg 1 three times daily before meals and 2 bedtime 3)  See councilar at Dr. Marton Redwood office Prescriptions: LORAZEPAM 1 MG TABS (LORAZEPAM) 1 three times a day ac anfd 2 hs for anxiety and stress, only refill on schedule, not over 5 per day  #150 x 1   Entered and Authorized by:   Judithann Sheen MD   Signed by:   Judithann Sheen MD on 03/20/2009   Method used:   Print then Give to Patient   RxID:   [id number]

## 2010-03-12 NOTE — Letter (Signed)
Summary: [facility]  [facility]   Imported By: Maryln Gottron 10/09/2009 12:16:25  _____________________________________________________________________  External Attachment:    Type:   Image     Comment:   External Document

## 2010-03-12 NOTE — Progress Notes (Signed)
Summary: Pt req script for Ritalin and also an ov with Dr Scotty Court  Phone Note Refill Request Call back at [phone] cell Message from:  Patient on January 16, 2010 9:56 AM  Refills Requested: Medication #1:  RITALIN 20 MG TABS 1 three times a day fill sept 8 2011   Dosage confirmed as above?Dosage Confirmed   Supply Requested: 3 months Pt will run out of med tomorrow.  Pt also would like an ov with Dr. Scotty Court asap to discuss depression issues.  Pls call.    Method Requested: Pick up at Office Initial call taken by: Lucy Antigua,  January 16, 2010 9:57 AM  Follow-up for Phone Call        Pt completely out of med and really needs to pick up script today if at all possible. Pls call when ready for pick up.  Follow-up by: Lucy Antigua,  January 17, 2010 3:48 PM  Additional Follow-up for Phone Call Additional follow up Details #1::        rx up front ready for p/u, pt aware Additional Follow-up by: Alfred Levins, CMA,  January 17, 2010 5:17 PM    Prescriptions: RITALIN 20 MG TABS (METHYLPHENIDATE HCL) 1 three times a day fill sept 8 2011  #90 x 0   Entered by:   Alfred Levins, CMA   Authorized by:   Judithann Sheen MD   Signed by:   Alfred Levins, CMA on 01/17/2010   Method used:   Print then Give to Patient   RxID:   [id number]

## 2010-03-12 NOTE — Progress Notes (Signed)
Summary: refill Ambien by Sunday???  Phone Note Call from Patient   Caller: Patient Call For: Judithann Sheen MD Reason for Call: Refill Medication Action Taken: Provider Notified Summary of Call: Pt needs Ambien refilled by Sunday, she is going out of town.  Will Dr. Scotty Court be back this week??? [facility] [address]) [phone] Initial call taken by: Lynann Beaver CMA,  July 26, 2009 10:02 AM  Follow-up for Phone Call        No. There is no record anywhere in her chart that she has ever taken this. Needs to ask Dr. Scotty Court Follow-up by: Nelwyn Salisbury MD,  July 26, 2009 10:27 AM  Additional Follow-up for Phone Call Additional follow up Details #1::        LMTOCB Additional Follow-up by: Romualdo Bolk, CMA Duncan Dull),  July 26, 2009 10:47 AM    Additional Follow-up for Phone Call Additional follow up Details #2::    ok done [facility] [address]  Pt notified Lynann Beaver [facility]  July 30, 2009 12:21 PM  Follow-up by: Pura Spice, RN,  July 30, 2009 12:12 PM

## 2010-03-12 NOTE — Progress Notes (Signed)
Summary: needs note  Phone Note Call from Patient   Caller: Patient Call For: Judithann Sheen MD Summary of Call: needs note for her boss @work  that when her neuropathy flares up, she may need to sit down.  Please ASAP.  her phone [phone] Initial call taken by: Raechel Ache, RN,  April 12, 2009 12:02 PM  Follow-up for Phone Call        ok per dr Scotty Court pt notified letter for pt to pick up  Follow-up by: Pura Spice, RN,  April 12, 2009 12:54 PM

## 2010-03-12 NOTE — Progress Notes (Signed)
Summary: REFILL  cymbalta   Phone Note Refill Request Message from:  Fax from Pharmacy  Refills Requested: Medication #1:  CYMBALTA 60 MG CPEP 1 by mouth once daily [phone]     FAX---[phone]  Initial call taken by: Warnell Forester,  April 02, 2009 9:53 AM  Follow-up for Phone Call        ok x 6 per dr Scotty Court.  Follow-up by: Pura Spice, RN,  April 03, 2009 7:50 AM    New/Updated Medications: CYMBALTA 60 MG CPEP (DULOXETINE HCL) 1 by mouth once daily Prescriptions: CYMBALTA 60 MG CPEP (DULOXETINE HCL) 1 by mouth once daily  #30 x 6   Entered by:   Pura Spice, RN   Authorized by:   Judithann Sheen MD   Signed by:   Pura Spice, RN on 04/03/2009   Method used:   [facility]        [facility]  [address] [id number]       [address]       [place], [place]  [address]       Ph: [phone] or [phone]       Fax: [phone]   RxID:   [id number]

## 2010-03-13 HISTORY — PX: SPINE SURGERY: SHX786

## 2010-03-14 NOTE — Progress Notes (Signed)
Summary: REFILL REQUEST  Phone Note Refill Request Message from:  Patient on January 29, 2010 2:29 PM  Refills Requested: Medication #1:  RISPERDAL 3 MG TABS 2 qd   [facility] [address]  Medication #2:  ZOLPIDEM TARTRATE 10 MG TABS 1 by mouth at bedtime for sleep   [facility] [address]    Initial call taken by: Debbra Riding,  January 29, 2010 2:29 PM  Follow-up for Phone Call        already done Follow-up by: Kern Reap CMA Duncan Dull),  January 31, 2010 10:22 AM

## 2010-03-14 NOTE — Progress Notes (Signed)
Summary: [facility]  Phone Note Refill Request Message from:  Fax from Pharmacy on January 29, 2010 11:55 AM  Refills Requested: Medication #1:  ZOLPIDEM TARTRATE 10 MG TABS 1 by mouth at bedtime for sleep Initial call taken by: Kern Reap CMA Duncan Dull),  January 29, 2010 11:55 AM  Follow-up for Phone Call        called in with 1 refill Follow-up by: Kern Reap CMA Duncan Dull),  January 29, 2010 11:55 AM

## 2010-03-14 NOTE — Assessment & Plan Note (Signed)
Summary: F/U NODULE ON THYROID//SLM   Vital Signs:  Patient profile:   50 year old female Weight:      155 pounds Pulse rate:   74 / minute BP sitting:   120 / 82  (right arm)  Vitals Entered By: Kyung Rudd, CMA (February 14, 2010 10:01 AM) CC: hosp f/u...ct scan found nodule on thyroid...no feeling in left hand   CC:  hosp f/u...ct scan found nodule on thyroid...no feeling in left hand.  History of Present Illness: Patient presents to clinic as a workin for evaluation of thyroid nodule. Recently evaluated for neck pain with possible nerve impingement. Pain worsened and presented to ED where neck CT was performed. Incidental notation of thyroid nodule. Thyroid US performed and reviewed with pt. Shows 3.2x1.5x2.2cm nodule with calcification. No anterior neck pain or dysphagia. Sees endocrinology for h/o DM.  Current Medications (verified): 1)  Risperdal 3 Mg Tabs (Risperidone) .... 2 Qd 2)  Prozac 40 Mg Caps (Fluoxetine Hcl) .[name].. 1 By Mouth Two Times A Day 3)  Lantus Solostar 100 Unit/ml Soln (Insulin Glargine) .... 30 Units Dr Altheimer 4)  Apidra Solostar 100 Unit/ml Soln (Insulin Glulisine) .... As Needed  Dr Altheimeir 5)  Ritalin 20 Mg Tabs (Methylphenidate Hcl) .[name].. 1 Three Times A Day Fill 8 Nov 6)  Prochlorperazine Maleate 10 Mg Tabs (Prochlorperazine Maleate) .[name].. 1 Q6h As Needed To Prevent Nausea Vomiting 7)  Ciprofloxacin Hcl 500 Mg Tabs (Ciprofloxacin Hcl) .[name].. 1 Two Times A Day To Prevent Diarhea 8)  Lipitor 40 Mg Tabs (Atorvastatin Calcium) .[name].. 1 Qd 9)  Zolpidem Tartrate 10 Mg Tabs (Zolpidem Tartrate) .[name].. 1 By Mouth At Bedtime For Sleep 10)  Medrol (Pak) 4 Mg Tabs (Methylprednisolone) .... As Directed 11)  Percocet 5-325 Mg Tabs (Oxycodone-Acetaminophen) .[name].. 1 Every 6 Hours  Allergies (verified): 1)  ! Sulfa 2)  ! Nsaids 3)  ! * Muscle Relaxers  Past History:  Past medical, surgical, family and social histories (including risk factors) reviewed for relevance  to current acute and chronic problems.  Past Medical History: Reviewed history from 03/20/2009 and no changes required. 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 oophorectomy  Family History: Reviewed history and no changes required.  Social History: Reviewed history from 02/08/2010 and no changes required. Never Smoked Alcohol use-no  Review of Systems      See HPI General:  Denies fatigue and sweats. ENT:  Denies difficulty swallowing and sore throat. Endo:  Denies cold intolerance, heat intolerance, and weight change.  Physical Exam  General:  Well-developed,well-nourished,in no acute distress; alert,appropriate and cooperative throughout examination Head:  Normocephalic and atraumatic without obvious abnormalities. No apparent alopecia or balding.   Impression & Recommendations:  Problem # 1:  THYROID NODULE (ICD-241.0) Assessment Unchanged Obtain TSh and free t4. Schedule endocrinology consult for consideration of FNA Orders: Endocrinology Referral (Endocrine) TLB-T4 (Thyrox), Free [phone]) TLB-TSH (Thyroid Stimulating Hormone) (84443-TSH)  Problem # 2:  CERVICAL RADICULOPATHY, LEFT (ICD-723.4) Assessment: Unchanged Stable. Followup with orthopedics for pending MRI  Complete Medication List: 1)  Risperdal 3 Mg Tabs (Risperidone) .... 2 qd 2)  Prozac 40 Mg Caps (Fluoxetine hcl) .[name].. 1 by mouth two times a day 3)  Lantus Solostar 100 Unit/ml Soln (Insulin glargine) .... 30 units dr altheimer 4)  Apidra Solostar 100 Unit/ml Soln (Insulin glulisine) .... As needed  dr altheimeir 5)  Ritalin 20 Mg Tabs (Methylphenidate hcl) .[name].. 1 three times a day fill 8 nov 6)  Prochlorperazine Maleate 10 Mg Tabs (Prochlorperazine maleate) .[name].. 1 q6h as needed to prevent nausea vomiting 7)   Ciprofloxacin Hcl 500 Mg Tabs (Ciprofloxacin hcl) .[name].. 1 two times a day to prevent diarhea 8)  Lipitor 40 Mg Tabs (Atorvastatin calcium) .[name].. 1 qd 9)  Zolpidem Tartrate 10 Mg Tabs (Zolpidem tartrate) .[name].. 1 by mouth at bedtime for sleep 10)  Percocet 5-325 Mg Tabs (Oxycodone-acetaminophen) .[name].. 1 every 6 hours   Orders Added: 1)  Endocrinology Referral [Endocrine] 2)  TLB-T4 (Thyrox), Free [16109-UE4V] 3)  TLB-TSH (Thyroid Stimulating Hormone) [84443-TSH] 4)  Est. Patient Level IV [40981]

## 2010-03-14 NOTE — Assessment & Plan Note (Signed)
Summary: neck/back pain/njr   Vital Signs:  Patient profile:   50 year old female Weight:      155 pounds BMI:     24.00 Pulse rate:   62 / minute BP sitting:   114 / 68  (right arm)  Vitals Entered By: Kyung Rudd, CMA (February 08, 2010 1:31 PM) CC: c/o left side back arm, and neck pain x   CC:  c/o left side back arm and and neck pain x .  History of Present Illness: Patient presents to clinic as a workin for evaluation of neck pain. Notes 2 + month h/o left posterior neck pain, left trapezius pain, left radiating arm pain and left upper posterior scapular pain. Denies arm weakness, injury/trauma or arm paresthesias. No alleviating or exacerbating factors. Has been using motrin 800mg  two times a day for at least a month without abd pain or blood in stool. Chart reviewed. +h/o narcotic addiction in the past followed by psychiatry. Denies narcotic use. +h/o DM followed by endocrinology. States typicaly fsbs range 150-250 without hypoglycemia.  Preventive Screening-Counseling & Management  Alcohol-Tobacco     Smoking Status: never  Current Medications (verified): 1)  Risperdal 3 Mg Tabs (Risperidone) .... 2 Qd 2)  Prozac 40 Mg Caps (Fluoxetine Hcl) .[name].. 1 By Mouth Two Times A Day 3)  Lantus Solostar 100 Unit/ml Soln (Insulin Glargine) .... 30 Units Dr Altheimer 4)  Apidra Solostar 100 Unit/ml Soln (Insulin Glulisine) .... As Needed  Dr Altheimeir 5)  Ritalin 20 Mg Tabs (Methylphenidate Hcl) .[name].. 1 Three Times A Day Fill 8 Nov 6)  Prochlorperazine Maleate 10 Mg Tabs (Prochlorperazine Maleate) .[name].. 1 Q6h As Needed To Prevent Nausea Vomiting 7)  Ciprofloxacin Hcl 500 Mg Tabs (Ciprofloxacin Hcl) .[name].. 1 Two Times A Day To Prevent Diarhea 8)  Lipitor 40 Mg Tabs (Atorvastatin Calcium) .[name].. 1 Qd 9)  Zolpidem Tartrate 10 Mg Tabs (Zolpidem Tartrate) .[name].. 1 By Mouth At Bedtime For Sleep  Allergies (verified): 1)  ! Sulfa 2)  ! Nsaids 3)  ! * Muscle Relaxers  Past  History:  Past medical, surgical, family and social histories (including risk factors) reviewed, and no changes noted (except as noted below).  Past Medical History: Reviewed history from 03/20/2009 and no changes required. 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 oophorectomy  Family History: Reviewed history and no changes required.  Social History: Reviewed history and no changes required. Never Smoked Alcohol use-no  Review of Systems      See HPI MS:  Denies joint pain, joint redness, joint swelling, loss of strength, and muscle weakness. Endo:  Denies excessive thirst and excessive urination.  Physical Exam  General:  Well-developed,well-nourished,in no acute distress; alert,appropriate and cooperative throughout examination Head:  Normocephalic and atraumatic without obvious abnormalities. No apparent alopecia or balding. Eyes:  pupils equal, pupils round, corneas and lenses clear, and no injection.   Ears:  no external deformities.   Nose:  no external deformity.   Msk:  no midline cervical tenderness or bony abn. no paraspinal muscle spasm. FROM of neck and arms. Distal left UE strength 5/5. Sensation grossly intact. Skin:  turgor normal, color normal, no rashes, and no ecchymoses.     Impression & Recommendations:  Problem # 1:  CERVICAL RADICULOPATHY, LEFT (ICD-723.4) Assessment New Neurologically nonfocal. Failing nsaids. Avoid narcotics given past  h/o addiction. Attempt medrol dosepak and schedule PT. Followup if no improvement or worsening.  Orders: Physical Therapy Referral (PT)  Problem # 2:  IDDM (ICD-250.01) Assessment: Unchanged With steriod tx recommended discussed possible hyperglycemia. Recommend close monitoring of fsbs and contact endocrinology with increases of  blood sugar for potential medication dosing instructions.  Her updated medication list for this problem includes:    Lantus Solostar 100 Unit/ml Soln (Insulin glargine) .Marland KitchenMarland KitchenMarland KitchenMarland Kitchen 30 units dr altheimer    Jones Skene Solostar 100 Unit/ml Soln (Insulin glulisine) .[name]... As needed  dr altheimeir  Complete Medication List: 1)  Risperdal 3 Mg Tabs (Risperidone) .... 2 qd 2)  Prozac 40 Mg Caps (Fluoxetine hcl) .[name].. 1 by mouth two times a day 3)  Lantus Solostar 100 Unit/ml Soln (Insulin glargine) .... 30 units dr altheimer 4)  Apidra Solostar 100 Unit/ml Soln (Insulin glulisine) .... As needed  dr altheimeir 5)  Ritalin 20 Mg Tabs (Methylphenidate hcl) .[name].. 1 three times a day fill 8 nov 6)  Prochlorperazine Maleate 10 Mg Tabs (Prochlorperazine maleate) .[name].. 1 q6h as needed to prevent nausea vomiting 7)  Ciprofloxacin Hcl 500 Mg Tabs (Ciprofloxacin hcl) .[name].. 1 two times a day to prevent diarhea 8)  Lipitor 40 Mg Tabs (Atorvastatin calcium) .[name].. 1 qd 9)  Zolpidem Tartrate 10 Mg Tabs (Zolpidem tartrate) .[name].. 1 by mouth at bedtime for sleep 10)  Medrol (pak) 4 Mg Tabs (Methylprednisolone) .... As directed Prescriptions: MEDROL (PAK) 4 MG TABS (METHYLPREDNISOLONE) as directed  #1 x 0   Entered and Authorized by:   Edwyna Perfect MD   Signed by:   Edwyna Perfect MD on 02/08/2010   Method used:   [facility]  [address] [id number]       [address]       [place], [place]  [address]       Ph: [phone] or [phone]       Fax: [phone]   RxID:   [id number]    Orders Added: 1)  Physical Therapy Referral [PT] 2)  Est. Patient Level IV [87564]

## 2010-03-14 NOTE — Letter (Signed)
Summary: [facility]   Imported By: Maryln Gottron 02/25/2010 11:27:10  _____________________________________________________________________  External Attachment:    Type:   Image     Comment:   External Document

## 2010-03-14 NOTE — Progress Notes (Signed)
----   Converted from flag ---- ---- 02/15/2010 3:04 PM, Edwyna Perfect MD wrote: thyroid blood test nl. waiting on endo appt ------------------------------  Phone Note Outgoing Call   Call placed by: Kyung Rudd, CMA,  February 15, 2010 4:36 PM Call placed to: Patient Details for Reason: lab results Summary of Call: pt aware Initial call taken by: Kyung Rudd, CMA,  February 15, 2010 4:38 PM

## 2010-03-14 NOTE — Miscellaneous (Signed)
Summary: needs ultrasound thyroid   Clinical Lists Changes  Orders: Added new Referral order of Radiology Referral (Radiology) - Signed

## 2010-03-14 NOTE — Medication Information (Signed)
Summary: PA and Approval for Zolpidem Tartrate  PA and Approval for Zolpidem Tartrate   Imported By: Maryln Gottron 02/07/2010 15:10:39  _____________________________________________________________________  External Attachment:    Type:   Image     Comment:   External Document

## 2010-03-14 NOTE — Progress Notes (Signed)
  Phone Note Call from Patient   Caller: Patient Call For: Charlynn Court MD Details for Reason: refill Summary of Call: pt requesting refll of her ritalin 20mg  tid Initial call taken by: Kyung Rudd, CMA,  February 15, 2010 2:04 PM  Follow-up for Phone Call        rx written and ready to pick up. pt aware Follow-up by: Kyung Rudd, CMA,  February 15, 2010 2:41 PM    Prescriptions: RITALIN 20 MG TABS (METHYLPHENIDATE HCL) 1 three times a day FILL 8 Nov  #90 x 0   Entered and Authorized by:   Edwyna Perfect MD   Signed by:   Edwyna Perfect MD on 02/15/2010   Method used:   Print then Give to Patient   RxID:   [id number]

## 2010-03-14 NOTE — Progress Notes (Signed)
Summary: hospital results  Phone Note Call from Patient   Caller: Patient Reason for Call: Talk to Doctor Summary of Call: patient went to hospital and had a CT of neck.  c5-c6 are herniated and a spot was found on her thyroid.  [phone] cell number Initial call taken by: Kern Reap CMA Duncan Dull),  February 12, 2010 9:33 AM  Follow-up for Phone Call        will sch ultrasound and then needs ov terri will call when appt for ultrasound sch.  Follow-up by: Pura Spice, RN,  February 12, 2010 1:31 PM  Additional Follow-up for Phone Call Additional follow up Details #1::        pt aware Additional Follow-up by: Pura Spice, RN,  February 12, 2010 1:35 PM  New Problems: THYROID NODULE (ICD-241.0)   New Problems: THYROID NODULE (ICD-241.0)

## 2010-03-18 ENCOUNTER — Telehealth: Payer: Self-pay | Admitting: Family Medicine

## 2010-03-18 DIAGNOSIS — F988 Other specified behavioral and emotional disorders with onset usually occurring in childhood and adolescence: Secondary | ICD-10-CM

## 2010-03-18 MED ORDER — METHYLPHENIDATE HCL 20 MG PO TABS: 20.0000 mg | ORAL_TABLET | Freq: Three times a day (TID) | ORAL | Status: DC

## 2010-03-18 NOTE — Telephone Encounter (Signed)
Pt called and needs new script for Ritlan 20mg . Pls call pt when ready for pick up.

## 2010-03-22 ENCOUNTER — Encounter (HOSPITAL_COMMUNITY)
Admission: RE | Admit: 2010-03-22 | Discharge: 2010-03-22 | Disposition: A | Discharge status: Home / Self Care | Payer: BC Managed Care – PPO | Source: Ambulatory Visit | Attending: Orthopedic Surgery | Admitting: Orthopedic Surgery

## 2010-03-22 ENCOUNTER — Ambulatory Visit (HOSPITAL_COMMUNITY)
Admission: RE | Admit: 2010-03-22 | Discharge: 2010-03-22 | Disposition: A | Discharge status: Home / Self Care | Payer: BC Managed Care – PPO | Source: Ambulatory Visit | Attending: Orthopedic Surgery | Admitting: Orthopedic Surgery

## 2010-03-22 ENCOUNTER — Other Ambulatory Visit (HOSPITAL_COMMUNITY): Payer: Self-pay | Admitting: Orthopedic Surgery

## 2010-03-22 DIAGNOSIS — M542 Cervicalgia: Secondary | ICD-10-CM | POA: Insufficient documentation

## 2010-03-22 DIAGNOSIS — M5020 Other cervical disc displacement, unspecified cervical region: Secondary | ICD-10-CM

## 2010-03-22 DIAGNOSIS — Z01818 Encounter for other preprocedural examination: Secondary | ICD-10-CM | POA: Insufficient documentation

## 2010-03-22 LAB — CBC
HCT: 38.8 % (ref 36.0–46.0)
Hemoglobin: 13.4 g/dL (ref 12.0–15.0)
MCH: 32.4 pg (ref 26.0–34.0)
MCHC: 34.5 g/dL (ref 30.0–36.0)
MCV: 93.9 fL (ref 78.0–100.0)
RBC: 4.13 MIL/uL (ref 3.87–5.11)

## 2010-03-22 LAB — BASIC METABOLIC PANEL
BUN: 20 mg/dL (ref 6–23)
CO2: 27 mEq/L (ref 19–32)
Calcium: 9.7 mg/dL (ref 8.4–10.5)
Chloride: 104 mEq/L (ref 96–112)
Creatinine, Ser: 0.84 mg/dL (ref 0.4–1.2)
Glucose, Bld: 103 mg/dL — ABNORMAL HIGH (ref 70–99)

## 2010-03-27 ENCOUNTER — Ambulatory Visit (HOSPITAL_COMMUNITY)
Admission: RE | Admit: 2010-03-27 | Discharge: 2010-03-28 | Disposition: A | Discharge status: Home / Self Care | Payer: BC Managed Care – PPO | Source: Ambulatory Visit | Attending: Orthopedic Surgery | Admitting: Orthopedic Surgery

## 2010-03-27 ENCOUNTER — Ambulatory Visit (HOSPITAL_COMMUNITY): Payer: BC Managed Care – PPO

## 2010-03-27 ENCOUNTER — Inpatient Hospital Stay (HOSPITAL_COMMUNITY): Payer: BC Managed Care – PPO

## 2010-03-27 DIAGNOSIS — Z23 Encounter for immunization: Secondary | ICD-10-CM | POA: Insufficient documentation

## 2010-03-27 DIAGNOSIS — F988 Other specified behavioral and emotional disorders with onset usually occurring in childhood and adolescence: Secondary | ICD-10-CM | POA: Insufficient documentation

## 2010-03-27 DIAGNOSIS — E119 Type 2 diabetes mellitus without complications: Secondary | ICD-10-CM | POA: Insufficient documentation

## 2010-03-27 DIAGNOSIS — M5020 Other cervical disc displacement, unspecified cervical region: Secondary | ICD-10-CM | POA: Insufficient documentation

## 2010-03-27 DIAGNOSIS — F3289 Other specified depressive episodes: Secondary | ICD-10-CM | POA: Insufficient documentation

## 2010-03-27 DIAGNOSIS — F329 Major depressive disorder, single episode, unspecified: Secondary | ICD-10-CM | POA: Insufficient documentation

## 2010-03-27 LAB — GLUCOSE, CAPILLARY
Glucose-Capillary: 89 mg/dL (ref 70–99)
Glucose-Capillary: 103 mg/dL — ABNORMAL HIGH (ref 70–99)
Glucose-Capillary: 69 mg/dL — ABNORMAL LOW (ref 70–99)
Glucose-Capillary: 199 mg/dL — ABNORMAL HIGH (ref 70–99)

## 2010-03-28 LAB — POCT I-STAT GLUCOSE
Glucose, Bld: 253 mg/dL — ABNORMAL HIGH (ref 70–99)
Glucose, Bld: 169 mg/dL — ABNORMAL HIGH (ref 70–99)
Operator id: 147011

## 2010-03-28 LAB — GLUCOSE, CAPILLARY: Glucose-Capillary: 278 mg/dL — ABNORMAL HIGH (ref 70–99)

## 2010-03-28 NOTE — Op Note (Signed)
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  OPERATIVE PROCEDURE:  Anterior cervical diskectomy and fusion C5-6 using the Titan titanium intervertebral graft packed with DBX with cortical cancellus chips and a Synthes Vectra anterior cervical plate.  COMPLICATIONS:  None.  CONDITION:  Stable.  HISTORY:  This is a very pleasant 50 year old woman who has been having horrific neck and radicular left arm pain, weakness, and numbness.  MRI confirmed a very large posterolateral left disk herniation with caudal migration.  After discussing treatment options and failing injection therapy, we elected to proceed with surgery.  All appropriate risks, benefits, and alternatives were discussed with the patient and her family and consent was obtained.  OPERATIVE NOTE:  The patient was brought to the operating room, placed supine on the operating table.  After successful induction of general anesthesia and endotracheal intubation, TEDs, SCDs, and Foley were inserted.  The rolled towels were placed between the shoulder blades. The shoulders themselves were taped down, and the anterior cervical spine was prepped and draped in standard fashion.  Appropriate time-out was done to confirm patient, procedure, affected extremity, and all pertinent and important data.  Once this was done, x-ray was brought into the field in order to localize the incision.  A transverse incision starting right above the cricoid cartilage was made traveling to  the left.  Sharp dissection was carried out down to and through the platysma.  I then began dissecting sharply along the medial border of the sternocleidomastoid sweeping the trachea and esophagus to the right and protecting with a finger, identifying the carotid artery and protecting with a finger on the lateral side.  At this point, I swept down, continued dissecting through the deep cervical fascia and prevertebral fascia to the anterior longitudinal ligament.  I placed a thyroid retractor to mobilize the esophagus and completely expose the C5- 6 disk space.  A needle was placed in the 5-6 disk.  X-ray was taken and we confirmed that we are at the appropriate level.  Once this was done, I then mobilized the longus colli muscles using bipolar electrocautery from the midbody of C5 to the midbody of C6.  This was done bilaterally. I then placed a Caspar self-retaining retractor blades underneath the longus colli muscles bilaterally and then expanded the endotracheal cuff.  I then deflated the endotracheal cuff while expanding the retractors and then reinflated it once they were properly positioned. Distraction pins were then placed into the body of C5 and C6 and I distracted the 5-6 disk space.  A 15-blade scalpel was used to perform an annulotomy and then I used a series of pituitary rongeurs, curettes, and  Kerrison rongeurs to remove all of the disk material.  Using a fine nerve hook, I was sweeping behind the posterior aspect of the vertebral body on the left-hand side and I produced multiple fragments of disk material.  I then used a microcurette to develop a plane underneath the posterior longitudinal ligament and then I used a 1-mm Kerrison to completely resect the posterior longitudinal ligament and expose the thecal sac.  At this point, I then swept out from right to left with a nerve root behind the vertebral body of C6 because the fragment had gone caudally.  I did take an  x-ray confirming that my nerve hook was posterior to the vertebral body of C6.  I was very satisfied with the quantity of disk material that I had removed and I did feel as though I got all the fragments and that C6 corpectomy would not be required.  At this point, I rasped the endplates until I had bleeding bone and then I used trial devices until I found the correct size spacer.  We used a large 7 lordotic Titan spacer packed with Synthes DBX cortical cancellus bone chips mixed. This was malleted to the appropriate depth and then I removed the distraction pins.  I applied a 14-mm contoured Vectra plate and secured it with 16-mm self-drilling screws into the body of C5 and 14-mm screws into the body of C6.  I copiously irrigated the wound with normal saline, made sure I had hemostasis using bipolar electrocautery, then removed the Caspar retractors and checked to ensure the esophagus was not entrapped beneath the plate.  I returned the trachea and esophagus to midline, irrigated again, closed the wound in a layered fashion with interrupted 2-0 Vicryl sutures for the platysma and 3-0 Monocryl for the skin.  Final AP and lateral x-rays intraop were satisfactory with hardware and graft in good position.  I then placed Steri-Strips and an Biochemist, clinical.  The patient was extubated, transferred to the PACU without incident.  At the end of the case, all needle and sponge counts were correct.     Alvy Beal, MD     DDB/MEDQ  D:  03/27/2010  T:  03/28/2010  Job:  [id number]  Electronically Signed by Venita Lick MD on 03/28/2010 02:10:18 PM

## 2010-04-22 ENCOUNTER — Telehealth: Payer: Self-pay | Admitting: Family Medicine

## 2010-04-22 NOTE — Telephone Encounter (Signed)
Pt no longer want ambien 10mg . She is requesting ambien cr 12.5mg  call into [facility] [address] [phone]

## 2010-04-23 ENCOUNTER — Telehealth: Payer: Self-pay | Admitting: Family Medicine

## 2010-04-23 ENCOUNTER — Other Ambulatory Visit: Payer: Self-pay | Admitting: Family Medicine

## 2010-04-23 MED ORDER — ZOLPIDEM TARTRATE ER 12.5 MG PO TBCR: 12.5000 mg | EXTENDED_RELEASE_TABLET | Freq: Every evening | ORAL | Status: DC | PRN

## 2010-04-23 NOTE — Telephone Encounter (Signed)
Called pt about ambien rx; pt wanted to change to [place] er; ok per dr. Scotty Court

## 2010-04-24 ENCOUNTER — Other Ambulatory Visit: Payer: Self-pay

## 2010-04-24 MED ORDER — ZOLPIDEM TARTRATE 10 MG PO TABS: 10.0000 mg | ORAL_TABLET | Freq: Every evening | ORAL | Status: DC | PRN

## 2010-05-02 ENCOUNTER — Other Ambulatory Visit: Payer: Self-pay | Admitting: Family Medicine

## 2010-05-02 MED ORDER — ZOLPIDEM TARTRATE ER 12.5 MG PO TBCR: 12.5000 mg | EXTENDED_RELEASE_TABLET | Freq: Every evening | ORAL | Status: DC | PRN

## 2010-05-02 NOTE — Telephone Encounter (Signed)
Pt requested extended release ambien---faxed to pharmacy

## 2010-05-07 ENCOUNTER — Encounter: Payer: Self-pay | Admitting: Family Medicine

## 2010-05-15 LAB — DIFFERENTIAL
Eosinophils Relative: 0 % (ref 0–5)
Lymphocytes Relative: 7 % — ABNORMAL LOW (ref 12–46)
Lymphs Abs: 0.7 10*3/uL (ref 0.7–4.0)

## 2010-05-15 LAB — URINE MICROSCOPIC-ADD ON

## 2010-05-15 LAB — BASIC METABOLIC PANEL
BUN: 20 mg/dL (ref 6–23)
GFR calc Af Amer: >60 mL/min (ref >60)
GFR calc non Af Amer: 55 mL/min — ABNORMAL LOW (ref >60)
Potassium: 4 mEq/L (ref 3.5–5.1)
Sodium: 132 mEq/L — ABNORMAL LOW (ref 135–145)

## 2010-05-15 LAB — CBC
HCT: 38.4 % (ref 36.0–46.0)
Hemoglobin: 13.2 g/dL (ref 12.0–15.0)
Platelets: 294 10*3/uL (ref 150–400)
RBC: 4.19 MIL/uL (ref 3.87–5.11)
WBC: 9.9 10*3/uL (ref 4.0–10.5)

## 2010-05-15 LAB — POCT CARDIAC MARKERS: Troponin i, poc: <0.05 ng/mL (ref 0.00–0.09)

## 2010-05-15 LAB — URINALYSIS, ROUTINE W REFLEX MICROSCOPIC
Bilirubin Urine: NEGATIVE
Glucose, UA: >1000 mg/dL — AB
Hgb urine dipstick: NEGATIVE
Ketones, ur: 40 mg/dL — AB
pH: 5 (ref 5.0–8.0)

## 2010-05-15 LAB — CK TOTAL AND CKMB (NOT AT ARMC): CK, MB: 2.4 ng/mL (ref 0.3–4.0)

## 2010-05-15 LAB — TROPONIN I: Troponin I: 0.02 ng/mL (ref 0.00–0.06)

## 2010-05-15 LAB — D-DIMER, QUANTITATIVE: D-Dimer, Quant: 0.28 ug/mL-FEU (ref 0.00–0.48)

## 2010-05-15 LAB — GLUCOSE, CAPILLARY: Glucose-Capillary: 159 mg/dL — ABNORMAL HIGH (ref 70–99)

## 2010-06-04 ENCOUNTER — Encounter: Payer: Self-pay | Admitting: Family Medicine

## 2010-06-04 ENCOUNTER — Ambulatory Visit (INDEPENDENT_AMBULATORY_CARE_PROVIDER_SITE_OTHER): Payer: BC Managed Care – PPO | Admitting: Family Medicine

## 2010-06-04 DIAGNOSIS — G250 Essential tremor: Secondary | ICD-10-CM

## 2010-06-04 DIAGNOSIS — E119 Type 2 diabetes mellitus without complications: Secondary | ICD-10-CM

## 2010-06-04 DIAGNOSIS — M5010 Cervical disc disorder with radiculopathy, unspecified cervical region: Secondary | ICD-10-CM

## 2010-06-04 DIAGNOSIS — G252 Other specified forms of tremor: Secondary | ICD-10-CM

## 2010-06-04 DIAGNOSIS — F988 Other specified behavioral and emotional disorders with onset usually occurring in childhood and adolescence: Secondary | ICD-10-CM

## 2010-06-05 ENCOUNTER — Encounter: Payer: Self-pay | Admitting: Family Medicine

## 2010-06-24 ENCOUNTER — Other Ambulatory Visit: Payer: Self-pay | Admitting: Family Medicine

## 2010-06-24 NOTE — Telephone Encounter (Signed)
Pt needs new rx ritalin 20 mg three times a day °

## 2010-06-26 ENCOUNTER — Other Ambulatory Visit: Payer: Self-pay | Admitting: Family Medicine

## 2010-06-26 NOTE — Telephone Encounter (Signed)
Pt needs ritalin 20 mg three times a day.

## 2010-06-28 ENCOUNTER — Other Ambulatory Visit: Payer: Self-pay | Admitting: Family Medicine

## 2010-06-28 MED ORDER — METHYLPHENIDATE HCL 20 MG PO TABS: 20.0000 mg | ORAL_TABLET | Freq: Two times a day (BID) | ORAL | Status: DC

## 2010-06-28 MED ORDER — METHYLPHENIDATE HCL 20 MG PO TABS: 20.0000 mg | ORAL_TABLET | Freq: Three times a day (TID) | ORAL | Status: DC

## 2010-06-28 NOTE — H&P (Signed)
[facility]  Patient:    Lynn Barnes, Lynn Barnes                     MRN: [id number] Adm. Date:  [id number] Disc. Date: [id number] Attending:  Howell Pringle CC:         Quita Skye. Artis Flock, M.D.   History and Physical  CHIEF COMPLAINT:              Chronic left lower quadrant pain.  HISTORY OF PRESENT ILLNESS:   [age]-year-old woman, G0, with a more than four-year history of left lower quadrant pain admitted for diagnostic laparoscopy to evaluate her pelvis and rule out endometriosis or adhesions. 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  Her pain antedated removal of this cyst and has been persistent thereafter becoming worse in the last few weeks.  CT scan of the abdomen was normal.  Ultrasound at [facility] on March 18, 2000 showed no abnormality.  The pain is located in the left lower quadrant in a distinct circular 3 cm area and occurs almost daily, lasting for a number of hours, requiring narcotics over the last two weeks.  She is admitted now for diagnostic laparoscopy to rule out adhesions and endometriosis.  PAST MEDICAL HISTORY:         Medical:  Insulin-dependent diabetes mellitus for 15 years.  Depression.  History of irritable bowel syndrome.  Operative procedures:  Left salpingo-oophorectomy in 1999.  Right breast lumpectomy and back surgery.  MEDICATIONS:                  Prozac, insulin and calcium.  ALLERGIES:                    SULFA and LIBRAX.  FAMILY HISTORY:               Mother with hypercholesterolemia and hypertension.  Father with hypertension.  SOCIAL HISTORY:               Single, in long term relationship.  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  Denies tobacco use or alcohol use.  History of hydrocodone use for management of previous left lower quadrant pain, status post rehabilitation several  years ago.  PHYSICAL EXAMINATION:  GENERAL APPEARANCE:           Healthy appear woman.  VITAL SIGNS:                  Afebrile; vital signs stable.  HEENT:                        Within normal limits.  NECK:                         Without thyromegaly.  CHEST:                        Clear.  COR:                          Regular rate and rhythm.  S1 and S2 normal.  BREASTS:                      Without mass, tenderness, axillary or supraclavicular nodes.  Well healed surgical scar on the right.  ABDOMEN:                      Soft, nontender without organomegaly, mass or hernia.  Well laparoscopy incision.  BACK:                         Without CVAT.  Well healed surgical.  GENITOURINARY:                External genitalia, BUS and vagina without lesions.  Cervix without lesions.  Uterus anteverted, normal size, nontender and mobile.  Left adnexa nonpalpable and nontender.  Right adnexa normal to palpation.  Rectovaginal exam confirmatory.  Stool Hemoccult negative.  ASSESSMENT AND PLAN:          1. Chronic left lower quadrant pain - more                                  pronounced recently - rule out adhesions                                  or endometriosis.                               2. Status post left salpingo-oophorectomy in                                  1999 for mucinous cystadenoma.                               3. Insulin-dependent diabetic.                               4. Depression - treated with Prozac.                               5. Status post right breast biopsy for a                                  fibroadenoma.                               6. Postmenopausal.  Plan:                         Open technique diagnostic laparoscopy. DD:  03/25/00 TD:  03/25/00 Job: [id number]

## 2010-06-28 NOTE — Op Note (Signed)
[facility]  Patient:    Lynn Barnes, Lynn Barnes                     MRN: [id number] Proc. Date: 03/26/00 Adm. Date:  [id number] Attending:  Ardeen Fillers CC:         Quita Skye. Artis Flock, M.D.   Operative Report  INDICATIONS:                  A 50 year old woman, G0, with a more that four-year history of left lower quadrant pain admitted for diagnostic laparoscopy to evaluate her pelvis and rule out endometriosis or adhesions. The patient is status post laparotomy left salpingo-oophorectomy for a left ovarian mucinous cystadenoma in July 1999.  Her pain antedated removal of the cyst and has been persistent thereafter, becoming worse in the last few weeks. CT scan and pelvic ultrasounds have been negative.  PREOPERATIVE DIAGNOSIS:       Chronic left lower quadrant pain.  POSTOPERATIVE DIAGNOSIS:      Chronic left lower quadrant pain.  PROCEDURE:                    Diagnostic laparoscopy (open technique).  SURGEON:                      Sung Amabile. Roslyn Smiling, M.D.  ASSISTANT:                    Marina Gravel, M.D.  ANESTHESIA:                   General with endotracheal tube intubation.  ESTIMATED BLOOD LOSS:         Less than 20 cc.  TUBES AND DRAINS:             Foley intraoperatively.  COMPLICATIONS:                None.  FINDINGS:                     Normal sized anteverted uterus.  Normal right tube and ovary.  Left tube and ovary surgically absent.  No evidence of endometriosis or adhesions.  Retrocecal appendix.  Normal appearing upper abdomen - liver edge.  Sigmoid free and mobile with several prominent epiploic fat pads.  SPECIMENS:                    None.  DESCRIPTION OF PROCEDURE:     After the establishment of general anesthesia, the patient was placed in the dorsal lithotomy position.  The abdomen, perineum and vagina were prepped and a Foley catheter was placed.  The patient was draped.  A transverse subumbilical incision was made through  the previous surgical scar with a knife.  The incision was carried to the level of the fascia using sharp dissection.  The fascia was grasped, tented and entered in a clear space sharply, taking care to avoid injury to underlying viscera. Angle sutures were placed with 0 Vicryl.  A Hasson laparoscopic sleeve and trocar were placed.  Initial placement was short of the peritoneum.  This was recognized and the Hasson was reinserted into the peritoneal cavity.  Carbon dioxide was insufflated.  After adequate pneumoperitoneum was achieved and the scope was placed under direct visualization, a 5 mm port was placed suprapubically under direct visualization.  The above findings were noted. Photographs were taken.  The lower port was removed  under direct visualization.  The scope was removed and the laparoscopic sleeve was then removed.  The fascia was closed using a running suture of 0 Vicryl.  The fat was irrigated with warm normal saline.  A single subcutaneous fat stitch was placed with 0 Vicryl.  The skin was closed with a subcuticular stitch of 4-0 chromic.  The lower port was closed with a mattress subcuticular suture of 4-0 chromic.  The vaginal instruments were removed, as was the Foley catheter. The patient was returned to the supine position, extubated without difficulty and transported to the recovery room in satisfactory condition. DD:  03/26/00 TD:  03/27/00 Job: [id number]

## 2010-06-28 NOTE — Op Note (Signed)
[facility]  Patient:    Lynn Barnes, Lynn Barnes                       MRN: [id number] Proc. Date: 07/09/00 Attending:  Georges Lynch. Darrelyn Hillock, M.D.                           Operative Report  PREOPERATIVE DIAGNOSES: 1. Severe impingement syndrome, left shoulder. 2. Chronic bursitis, left shoulder. 3. Rule out possibility of rotator cuff tendon tear.  POSTOPERATIVE DIAGNOSES: 1. Severe impingement syndrome, left shoulder. 2. Chronic bursitis, left shoulder.  OPERATION PERFORMED: 1. Open partial acromionectomy and acromioplasty, left shoulder for severe    impingement syndrome. 2. Excision subdeltoid bursa, left shoulder. 3. Exploration of the rotator cuff, left shoulder.  DESCRIPTION OF PROCEDURE:  Under general anesthesia, routine orthopedic prep and draping of the left shoulder was carried out. The patient had 1 gm of IV Ancef preop. At this time, an incision was made over the anterior aspect of the left shoulder, bleeders identified and cauterized. At this time, I then inserted the self retaining retractors and I detached the deltoid tendon from the acromion by sharp dissection. I then inserted a Bennett retractor and did a partial acromionectomy and acromioplasty. She had severe impingement syndrome of the left shoulder. I then identified a large thickened subacromial bursa and I removed that as well. I then explored the rotator cuff, the rotator cuff was intact but it was somewhat thinned out from the pressure of her acromion. I thoroughly irrigated out the area and reattached the deltoid tendon and muscle in the usual fashion. The remaining part of the wound was closed in the usual fashion. I injected 10 cc of 0.5% Marcaine into the wound site. A sterile Neosporin dressing was applied and she was then placed in a shoulder immobilizer. Note, she will be placed on the instant protocol postop. D:  07/09/00 TD:  07/09/00 Job: [id number]

## 2010-06-28 NOTE — Telephone Encounter (Signed)
Pt is aware.  

## 2010-06-28 NOTE — Telephone Encounter (Signed)
Ok per Dr. Scotty Court to fill for 3 months

## 2010-06-29 ENCOUNTER — Encounter: Payer: Self-pay | Admitting: Family Medicine

## 2010-06-29 NOTE — Patient Instructions (Signed)
I feel that it would be better for you to see the urologist regarding your intention tremor Refill Ritalin

## 2010-06-29 NOTE — Progress Notes (Signed)
  Subjective:    Patient ID: Lynn Barnes, female    DOB: 1960-09-11, 50 y.o.   MRN: [id number] This 50 year old white female with known insulin-dependent diabetes which is not under good control most of the time and she is treated by endocrinologists Today she is complaining of no defect of her intention tremor the hands and arms bilaterally has increased in severity she has had this for sometime but not to veer she had herniated cervical disc removed February 15 and she relates this is increased in severity since that time she did receive relief of pain of her neck from the surgery this was done by Dr. Shon Baton orthopedic surgeon Patient has thyroid nodule which is to be removed patient has seen a neurologist in the past and I recommended she return to Dr. Richardean Chimera  For evaluation and treatment The patient is ADD and is on Ritalin HPI    Review of Systems see history of present illness    Objective:   Physical Exam the patient will well well-nourished white female who is alert oriented and cooperative and pleasant Neck examination reveals some persisting tenderness over the cervical spine Neurological exam reveals intention tremor both arms or hands neurological exam otherwise negative Heart exam normal        Assessment & Plan:  Intention tremor recommended seeing neurologist Diabetes mellitus continuing in the care of endocrinologist Review refill on Ritalin Thyroid nodule to see Dr. Darnell Level

## 2010-07-01 ENCOUNTER — Telehealth: Payer: Self-pay | Admitting: *Deleted

## 2010-07-01 NOTE — Telephone Encounter (Signed)
Done

## 2010-07-01 NOTE — Telephone Encounter (Signed)
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

## 2010-07-02 NOTE — Telephone Encounter (Signed)
Pt is aware letter is ready to be picked up  

## 2010-07-11 ENCOUNTER — Ambulatory Visit: Payer: BC Managed Care – PPO | Admitting: Family Medicine

## 2010-07-23 ENCOUNTER — Ambulatory Visit (INDEPENDENT_AMBULATORY_CARE_PROVIDER_SITE_OTHER): Payer: BC Managed Care – PPO | Admitting: Family Medicine

## 2010-07-23 ENCOUNTER — Encounter: Payer: Self-pay | Admitting: Family Medicine

## 2010-07-23 VITALS — BP 102/68 | HR 126 | Temp 98.7°F | Wt 139.0 lb

## 2010-07-23 DIAGNOSIS — F988 Other specified behavioral and emotional disorders with onset usually occurring in childhood and adolescence: Secondary | ICD-10-CM

## 2010-07-23 DIAGNOSIS — G20 Parkinson's disease: Secondary | ICD-10-CM

## 2010-07-23 DIAGNOSIS — F329 Major depressive disorder, single episode, unspecified: Secondary | ICD-10-CM

## 2010-07-23 DIAGNOSIS — E119 Type 2 diabetes mellitus without complications: Secondary | ICD-10-CM

## 2010-07-23 DIAGNOSIS — F3289 Other specified depressive episodes: Secondary | ICD-10-CM

## 2010-07-23 DIAGNOSIS — F32A Depression, unspecified: Secondary | ICD-10-CM

## 2010-07-23 DIAGNOSIS — I10 Essential (primary) hypertension: Secondary | ICD-10-CM

## 2010-07-23 MED ORDER — ATORVASTATIN CALCIUM 40 MG PO TABS: 40.0000 mg | ORAL_TABLET | Freq: Every day | ORAL | Status: DC

## 2010-07-23 MED ORDER — ZOLPIDEM TARTRATE 10 MG PO TABS: 10.0000 mg | ORAL_TABLET | Freq: Every evening | ORAL | Status: DC | PRN

## 2010-09-08 ENCOUNTER — Encounter: Payer: Self-pay | Admitting: Family Medicine

## 2010-09-08 NOTE — Progress Notes (Signed)
  Subjective:    Patient ID: Lynn Barnes, female    DOB: November 30, 1960, 50 y.o.   MRN: [id number] This 50 year old single white female diabetic ADD, Parkinsonism him a hyperlipidemia and chronic depression is in today to discuss possible applying for disability. 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 We we discussed this to great length and she now agrees that she should try harder continue under good treatment and work out the next school year if at all possible Diabetes is somewhat better control under the endocrinologists she is seeing ane urologist regarding parkinsonism. HPI    Review of Systems no new symptoms or problems other than as stated on the history of present illness     Objective:   Physical Exam the patient is a well-built well-nourished white female who has a dull affect in no distress , pleasant and cooperative Head normocephalic no evidence of trauma Blood pressure 108/62, does not drop on standing , no  orthostatic hypotension Neurological examination reveals peripheral neuropathy also findings compatible with parkinsonism        Assessment & Plan:  Assessment of her medical problems revealed essentially no change in her diabetes, parkinsonism,  ADD, depression him or hypertension recommend continuing the same treatment In regard to disability I recommend that she attempt to work for the next year and then think about reapplying for disability Spent 30 - 40 minutes discussing her problems

## 2010-12-01 ENCOUNTER — Other Ambulatory Visit: Payer: Self-pay | Admitting: Family Medicine

## 2011-02-15 ENCOUNTER — Encounter: Payer: Self-pay | Admitting: Internal Medicine

## 2011-02-15 DIAGNOSIS — Z0000 Encounter for general adult medical examination without abnormal findings: Secondary | ICD-10-CM | POA: Insufficient documentation

## 2011-02-15 DIAGNOSIS — K589 Irritable bowel syndrome without diarrhea: Secondary | ICD-10-CM | POA: Insufficient documentation

## 2011-02-15 DIAGNOSIS — F319 Bipolar disorder, unspecified: Secondary | ICD-10-CM

## 2011-02-15 DIAGNOSIS — Z0001 Encounter for general adult medical examination with abnormal findings: Secondary | ICD-10-CM | POA: Insufficient documentation

## 2011-02-15 HISTORY — DX: Irritable bowel syndrome, unspecified: K58.9

## 2011-02-15 HISTORY — DX: Bipolar disorder, unspecified: F31.9

## 2011-02-19 ENCOUNTER — Ambulatory Visit (INDEPENDENT_AMBULATORY_CARE_PROVIDER_SITE_OTHER): Payer: BC Managed Care – PPO | Admitting: Internal Medicine

## 2011-02-19 ENCOUNTER — Encounter: Payer: Self-pay | Admitting: Internal Medicine

## 2011-02-19 VITALS — BP 106/80 | HR 117 | Temp 97.6°F | Ht 67.5 in | Wt 162.1 lb

## 2011-02-19 DIAGNOSIS — G20 Parkinson's disease: Secondary | ICD-10-CM | POA: Insufficient documentation

## 2011-02-19 DIAGNOSIS — F988 Other specified behavioral and emotional disorders with onset usually occurring in childhood and adolescence: Secondary | ICD-10-CM

## 2011-02-19 DIAGNOSIS — Z0000 Encounter for general adult medical examination without abnormal findings: Secondary | ICD-10-CM

## 2011-02-19 DIAGNOSIS — I951 Orthostatic hypotension: Secondary | ICD-10-CM | POA: Insufficient documentation

## 2011-02-19 DIAGNOSIS — E041 Nontoxic single thyroid nodule: Secondary | ICD-10-CM | POA: Insufficient documentation

## 2011-02-19 DIAGNOSIS — F419 Anxiety disorder, unspecified: Secondary | ICD-10-CM | POA: Insufficient documentation

## 2011-02-19 DIAGNOSIS — F32A Depression, unspecified: Secondary | ICD-10-CM | POA: Insufficient documentation

## 2011-02-19 DIAGNOSIS — F329 Major depressive disorder, single episode, unspecified: Secondary | ICD-10-CM | POA: Insufficient documentation

## 2011-02-19 DIAGNOSIS — R55 Syncope and collapse: Secondary | ICD-10-CM | POA: Insufficient documentation

## 2011-02-19 MED ORDER — METHYLPHENIDATE HCL 20 MG PO TABS: 20.0000 mg | ORAL_TABLET | Freq: Three times a day (TID) | ORAL | Status: DC

## 2011-02-19 MED ORDER — RISPERIDONE 3 MG PO TABS: 3.0000 mg | ORAL_TABLET | Freq: Two times a day (BID) | ORAL | Status: DC

## 2011-02-19 MED ORDER — ZOLPIDEM TARTRATE 10 MG PO TABS: 10.0000 mg | ORAL_TABLET | Freq: Every evening | ORAL | Status: DC | PRN

## 2011-02-19 NOTE — Patient Instructions (Addendum)
Please remember to followup with your GYN for the yearly pap smear and/or mammogram You will be contacted regarding the referral for: colonoscopy We will try to get records from Dr Altheimer, and Dr Richardean Chimera Bonita Quin will be contacted regarding the referral for: carotid dopplers, and echocardiogram Your EKG was ok today Your Blood Pressure did seem to fall as you stood up today; we will consider the fludrocortisone pending the results of the other tests Take all new medications as prescribed - allegra for allergies as needed Continue all other medications as before Your medications were refilled today as requested, and list updated

## 2011-02-19 NOTE — Assessment & Plan Note (Signed)
ECG reviewed as per emr, orthostatics reviewed, also for echo and carotids, but seems likely related to prob orthostatic hypotension per pt hx

## 2011-02-19 NOTE — Progress Notes (Signed)
Subjective:    Patient ID: Lynn Barnes, female    DOB: 09/20/1960, 51 y.o.   MRN: [id number]  HPI  Here for wellness and f/u;  Overall doing ok;  Pt denies CP, worsening SOB, DOE, wheezing, orthopnea, PND, worsening LE edema, palpitations, but has recurrent dizziness, and franke syncope x 2 in 2012, with known orhtostatic hypotension per neurology.  Pt denies neurological change such as new Headache, facial or extremity weakness.  Pt denies polydipsia, polyuria, or low sugar symptoms. Pt states overall good compliance with treatment and medications, good tolerability, and trying to follow lower cholesterol diet.  Pt denies worsening depressive symptoms, suicidal ideation or panic. No fever, wt loss, night sweats, loss of appetite, or other constitutional symptoms.  Pt states good ability with ADL's, low fall risk, home safety reviewed and adequate, no significant changes in hearing or vision, and occasionally active with exercise.   Past Medical History  Diagnosis Date  . Rotator cuff syndrome of left shoulder   . S/P partial hysterectomy   . Addiction, opium   . Therapy     on therapy by psychiatrist using sebutex   . 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 IBS (irritable bowel syndrome) 02/15/2011  . Bipolar disorder 02/15/2011  . Anxiety   . Depression   . Diabetes mellitus     sees Dr Altheimer/endo  . Thyroid nodule     s/p biopsy - benign approx jan 2012  . Insomnia 02/21/2011  . Subacute dyskinesia due to drugs     dr dohmeier/neuro  . Resting tremor   . Cervical spondylosis without myelopathy   . Orthostatic hypotension    Past Surgical History  Procedure Date  . Ooporectomy   . Herniated disc cervical spine   . Left acromioplasty 2002    Dr Darrelyn Hillock  . Right breast lumpectomy   . Spine surgery feb 2012    c5-6 fusion, Dr Shon Baton    reports that she has never smoked. She has never used smokeless tobacco. She reports that she does not drink  alcohol or use illicit drugs. family history includes Cancer in her mother; Heart disease in her father; and Hypertension in her father. Allergies  Allergen Reactions  . Nsaids   . Sulfonamide Derivatives    Current Outpatient Prescriptions on File Prior to Visit  Medication Sig Dispense Refill  . atorvastatin (LIPITOR) 40 MG tablet Take 1 tablet (40 mg total) by mouth daily.  30 tablet  11  . insulin glargine (LANTUS SOLOSTAR) 100 UNIT/ML injection Inject into the skin. 30 units Dr. Leslie Dales       . insulin glulisine (APIDRA SOLOSTAR) 100 UNIT/ML injection Inject into the skin. As needed Dr. Berenda Morale       . risperiDONE (RISPERDAL) 3 MG tablet Take 1 tablet (3 mg total) by mouth 2 (two) times daily.  60 tablet  5   Review of Systems Review of Systems  Constitutional: Negative for diaphoresis, activity change, appetite change and unexpected weight change.  HENT: Negative for hearing loss, ear pain, facial swelling, mouth sores and neck stiffness.   Eyes: Negative for pain, redness and visual disturbance.  Respiratory: Negative for shortness of breath and wheezing.   Cardiovascular: Negative for chest pain and palpitations.  Gastrointestinal: Negative for diarrhea, blood in stool, abdominal distention and rectal pain.  Genitourinary: Negative for hematuria, flank pain and decreased urine volume.  Musculoskeletal: Negative for myalgias and joint swelling.  Skin: Negative for  color change and wound.  Neurological: Negative for numbness.  Hematological: Negative for adenopathy.  Psychiatric/Behavioral: Negative for hallucinations, self-injury, decreased concentration and agitation.      Objective:   Physical Exam BP 106/80  Pulse 117  Temp(Src) 97.6 F (36.4 C) (Oral)  Ht 5' 7.5" (1.715 m)  Wt 162 lb 2 oz (73.539 kg)  BMI 25.02 kg/m2  SpO2 96% Physical Exam  VS noted Constitutional: Pt is oriented to person, place, and time. Appears well-developed and well-nourished.  HENT:    Head: Normocephalic and atraumatic.  Right Ear: External ear normal.  Left Ear: External ear normal.  Nose: Nose normal.  Mouth/Throat: Oropharynx is clear and moist.  Eyes: Conjunctivae and EOM are normal. Pupils are equal, round, and reactive to light.  Neck: Normal range of motion. Neck supple. No JVD present. No tracheal deviation present.  Cardiovascular: Normal rate, regular rhythm, normal heart sounds and intact distal pulses.   Pulmonary/Chest: Effort normal and breath sounds normal.  Abdominal: Soft. Bowel sounds are normal. There is no tenderness.  Musculoskeletal: Normal range of motion. Exhibits no edema.  Lymphadenopathy:  Has no cervical adenopathy.  Neurological: Pt is alert and oriented to person, place, and time. Pt has normal reflexes. No cranial nerve deficit.  Skin: Skin is warm and dry. No rash noted.  Psychiatric:  Has  normal mood and affect. Behavior is normal. 1+nervous    Assessment & Plan:

## 2011-02-20 ENCOUNTER — Ambulatory Visit (INDEPENDENT_AMBULATORY_CARE_PROVIDER_SITE_OTHER): Payer: BC Managed Care – PPO

## 2011-02-20 ENCOUNTER — Other Ambulatory Visit: Payer: Self-pay | Admitting: Endocrinology

## 2011-02-20 DIAGNOSIS — L02519 Cutaneous abscess of unspecified hand: Secondary | ICD-10-CM

## 2011-02-20 DIAGNOSIS — Z23 Encounter for immunization: Secondary | ICD-10-CM

## 2011-02-20 DIAGNOSIS — E041 Nontoxic single thyroid nodule: Secondary | ICD-10-CM

## 2011-02-20 DIAGNOSIS — E119 Type 2 diabetes mellitus without complications: Secondary | ICD-10-CM

## 2011-02-20 DIAGNOSIS — L03119 Cellulitis of unspecified part of limb: Secondary | ICD-10-CM

## 2011-02-21 ENCOUNTER — Encounter: Payer: Self-pay | Admitting: Internal Medicine

## 2011-02-21 ENCOUNTER — Ambulatory Visit (HOSPITAL_COMMUNITY): Payer: BC Managed Care – PPO | Attending: Cardiovascular Disease | Admitting: Radiology

## 2011-02-21 DIAGNOSIS — G4700 Insomnia, unspecified: Secondary | ICD-10-CM | POA: Insufficient documentation

## 2011-02-21 DIAGNOSIS — E119 Type 2 diabetes mellitus without complications: Secondary | ICD-10-CM | POA: Insufficient documentation

## 2011-02-21 DIAGNOSIS — R002 Palpitations: Secondary | ICD-10-CM | POA: Insufficient documentation

## 2011-02-21 DIAGNOSIS — Z8249 Family history of ischemic heart disease and other diseases of the circulatory system: Secondary | ICD-10-CM | POA: Insufficient documentation

## 2011-02-21 DIAGNOSIS — R55 Syncope and collapse: Secondary | ICD-10-CM | POA: Insufficient documentation

## 2011-02-21 HISTORY — DX: Insomnia, unspecified: G47.00

## 2011-02-23 ENCOUNTER — Encounter: Payer: Self-pay | Admitting: Internal Medicine

## 2011-02-23 DIAGNOSIS — F988 Other specified behavioral and emotional disorders with onset usually occurring in childhood and adolescence: Secondary | ICD-10-CM | POA: Insufficient documentation

## 2011-02-23 NOTE — Assessment & Plan Note (Signed)

## 2011-02-23 NOTE — Assessment & Plan Note (Signed)
stable overall by hx and exam, most recent data reviewed with pt, and pt to continue medical treatment as before 

## 2011-02-24 ENCOUNTER — Encounter: Payer: BC Managed Care – PPO | Admitting: *Deleted

## 2011-02-26 ENCOUNTER — Telehealth: Payer: Self-pay | Admitting: *Deleted

## 2011-02-26 NOTE — Telephone Encounter (Signed)
Yes, loose stools are well known side effect that does not necessarily mean you have to stop the antibiotic in this case  If not too severe, ok for immodium, but if too much for her, we could consider change antibx

## 2011-02-26 NOTE — Telephone Encounter (Signed)
Pt reports she has been taking Augmentin x4 days for dog bite and now c/o weakness & diarrhea; would like to know if you believe this to be coming from medication and request recommendations.? Please advise.

## 2011-02-27 NOTE — Telephone Encounter (Signed)
Patient informed. 

## 2011-02-27 NOTE — Telephone Encounter (Signed)
Called left message to call back 

## 2011-02-28 ENCOUNTER — Other Ambulatory Visit: Payer: BC Managed Care – PPO

## 2011-03-05 ENCOUNTER — Other Ambulatory Visit: Payer: Self-pay | Admitting: *Deleted

## 2011-03-05 DIAGNOSIS — R55 Syncope and collapse: Secondary | ICD-10-CM

## 2011-03-07 ENCOUNTER — Encounter: Payer: BC Managed Care – PPO | Admitting: *Deleted

## 2011-03-31 ENCOUNTER — Encounter (INDEPENDENT_AMBULATORY_CARE_PROVIDER_SITE_OTHER): Payer: BC Managed Care – PPO | Admitting: Cardiology

## 2011-03-31 DIAGNOSIS — R42 Dizziness and giddiness: Secondary | ICD-10-CM

## 2011-03-31 DIAGNOSIS — R55 Syncope and collapse: Secondary | ICD-10-CM

## 2011-04-11 ENCOUNTER — Encounter: Payer: Self-pay | Admitting: Internal Medicine

## 2011-04-23 ENCOUNTER — Encounter: Payer: Self-pay | Admitting: Gastroenterology

## 2011-05-05 ENCOUNTER — Ambulatory Visit (AMBULATORY_SURGERY_CENTER): Payer: BC Managed Care – PPO | Admitting: *Deleted

## 2011-05-05 VITALS — Ht 67.5 in | Wt 155.0 lb

## 2011-05-05 DIAGNOSIS — Z1211 Encounter for screening for malignant neoplasm of colon: Secondary | ICD-10-CM

## 2011-05-05 MED ORDER — PEG-KCL-NACL-NASULF-NA ASC-C 100 G PO SOLR: ORAL | Status: DC

## 2011-05-06 ENCOUNTER — Encounter: Payer: Self-pay | Admitting: Gastroenterology

## 2011-05-12 ENCOUNTER — Ambulatory Visit
Admission: RE | Admit: 2011-05-12 | Discharge: 2011-05-12 | Disposition: A | Discharge status: Home / Self Care | Payer: BC Managed Care – PPO | Source: Ambulatory Visit | Attending: Endocrinology | Admitting: Endocrinology

## 2011-05-12 DIAGNOSIS — E041 Nontoxic single thyroid nodule: Secondary | ICD-10-CM

## 2011-05-13 ENCOUNTER — Other Ambulatory Visit: Payer: Self-pay

## 2011-05-13 ENCOUNTER — Ambulatory Visit: Payer: BC Managed Care – PPO | Admitting: Internal Medicine

## 2011-05-13 NOTE — Telephone Encounter (Signed)
No need as her last rx is dated for mar 10  - good for 30 days, and we can refill at her next visit

## 2011-05-13 NOTE — Telephone Encounter (Signed)
Patient informed. 

## 2011-05-13 NOTE — Telephone Encounter (Signed)
The patient was scheduled this AM for OV, but had to reschedule for 05/21/11. She would like to pickup hardcopies of her ritalin this afternoon (3 month supply), please advise call back number is [phone].

## 2011-05-15 ENCOUNTER — Encounter: Payer: Self-pay | Admitting: Gastroenterology

## 2011-05-15 ENCOUNTER — Ambulatory Visit (AMBULATORY_SURGERY_CENTER): Payer: BC Managed Care – PPO | Admitting: Gastroenterology

## 2011-05-15 VITALS — BP 121/78 | HR 114 | Temp 97.6°F | Resp 7 | Ht 67.5 in | Wt 155.0 lb

## 2011-05-15 DIAGNOSIS — D126 Benign neoplasm of colon, unspecified: Secondary | ICD-10-CM

## 2011-05-15 DIAGNOSIS — Z1211 Encounter for screening for malignant neoplasm of colon: Secondary | ICD-10-CM

## 2011-05-15 LAB — GLUCOSE, CAPILLARY
Glucose-Capillary: 433 mg/dL — ABNORMAL HIGH (ref 70–99)
Glucose-Capillary: 282 mg/dL — ABNORMAL HIGH (ref 70–99)

## 2011-05-15 MED ORDER — SODIUM CHLORIDE 0.9 % IV SOLN: 500.0000 mL | INTRAVENOUS | Status: DC

## 2011-05-15 NOTE — Patient Instructions (Signed)
YOU HAD AN ENDOSCOPIC PROCEDURE TODAY AT THE [facility]: Refer to the procedure report that was given to you for any specific questions about what was found during the examination.  If the procedure report does not answer your questions, please call your gastroenterologist to clarify.  If you requested that your care partner not be given the details of your procedure findings, then the procedure report has been included in a sealed envelope for you to review at your convenience later.  YOU SHOULD EXPECT: Some feelings of bloating in the abdomen. Passage of more gas than usual.  Walking can help get rid of the air that was put into your GI tract during the procedure and reduce the bloating. If you had a lower endoscopy (such as a colonoscopy or flexible sigmoidoscopy) you may notice spotting of blood in your stool or on the toilet paper. If you underwent a bowel prep for your procedure, then you may not have a normal bowel movement for a few days.  DIET: Your first meal following the procedure should be a light meal and then it is ok to progress to your normal diet.  A half-sandwich or bowl of soup is an example of a good first meal.  Heavy or fried foods are harder to digest and may make you feel nauseous or bloated.  Likewise meals heavy in dairy and vegetables can cause extra gas to form and this can also increase the bloating.  Drink plenty of fluids but you should avoid alcoholic beverages for 24 hours.  ACTIVITY: Your care partner should take you home directly after the procedure.  You should plan to take it easy, moving slowly for the rest of the day.  You can resume normal activity the day after the procedure however you should NOT DRIVE or use heavy machinery for 24 hours (because of the sedation medicines used during the test).    SYMPTOMS TO REPORT IMMEDIATELY: A gastroenterologist can be reached at any hour.  During normal business hours, 8:30 AM to 5:00 PM Monday through Friday,  call [phone].  After hours and on weekends, please call the GI answering service at [phone] who will take a message and have the physician on call contact you.   Following lower endoscopy (colonoscopy or flexible sigmoidoscopy):  Excessive amounts of blood in the stool  Significant tenderness or worsening of abdominal pains  Swelling of the abdomen that is new, acute  Fever of 100F or higher    FOLLOW UP: If any biopsies were taken you will be contacted by phone or by letter within the next 1-3 weeks.  Call your gastroenterologist if you have not heard about the biopsies in 3 weeks.  Our staff will call the home number listed on your records the next business day following your procedure to check on you and address any questions or concerns that you may have at that time regarding the information given to you following your procedure. This is a courtesy call and so if there is no answer at the home number and we have not heard from you through the emergency physician on call, we will assume that you have returned to your regular daily activities without incident.  SIGNATURES/CONFIDENTIALITY: You and/or your care partner have signed paperwork which will be entered into your electronic medical record.  These signatures attest to the fact that that the information above on your After Visit Summary has been reviewed and is understood.  Full responsibility of the confidentiality   of this discharge information lies with you and/or your care-partner.     

## 2011-05-15 NOTE — Progress Notes (Signed)
Patient did not have preoperative order for IV antibiotic SSI prophylaxis. (G8918)  Patient did not experience any of the following events: a burn prior to discharge; a fall within the facility; wrong site/side/patient/procedure/implant event; or a hospital transfer or hospital admission upon discharge from the facility. (G8907)  

## 2011-05-15 NOTE — Progress Notes (Signed)
PATIENT STATING HER BLOOD GLUCOSE THIS AM WAS 495. PATIENT TOOK LANTUS 50 UNITS AND APRIDA 8 UNITS THIS AM. GLUCOSE ON ARRIVAL 433. COMMUNICATED ABOVE RESULTS TO DR. KAPLAN. ORDER RECEIVED FOR REGULAR INSULIN 5 UNITS SQ. PRIOR TO ADMINISTRATION, CHECKED DOSAGE WITH NANCY HILL RN AND CELIA MCCOY RN. REGULAR INSULIN 5 UNITS GIVEN SQ RIGHT FOREARM AT 0920 BY Weston Brass RN

## 2011-05-15 NOTE — Op Note (Signed)
 [facility] [address] [place], [place]  [address]  COLONOSCOPY PROCEDURE REPORT  PATIENT:  Lynn Barnes, Lynn Barnes  MR#:  [id number] BIRTHDATE:  1961-01-11, 50 yrs. old  GENDER:  female ENDOSCOPIST:  Barbette Hair. Arlyce Dice, MD REF. BY:  Oliver Barre, M.D. PROCEDURE DATE:  05/15/2011 PROCEDURE:  Colonoscopy with snare polypectomy ASA CLASS:  Class II INDICATIONS:  Routine Risk Screening MEDICATIONS:   MAC sedation, administered by CRNA propofol 230mg IV  DESCRIPTION OF PROCEDURE:   After the risks benefits and alternatives of the procedure were thoroughly explained, informed consent was obtained.  Digital rectal exam was performed and revealed no abnormalities.   The LB 180AL [id number] endoscope was introduced through the anus and advanced to the cecum, which was identified by both the appendix and ileocecal valve, without limitations.  The quality of the prep was excellent, using MoviPrep.  The instrument was then slowly withdrawn as the colon was fully examined. <<PROCEDUREIMAGES>>  FINDINGS:  A sessile polyp was found in the ascending colon. It was 4 mm in size. Polyp was snared without cautery. Retrieval was successful (see image2). snare polyp  This was otherwise a normal examination of the colon (see image1 and image3).   Retroflexed views in the rectum revealed no abnormalities.    The time to cecum =  1) 3.0  minutes. The scope was then withdrawn in  1) 6.50 minutes from the cecum and the procedure completed. COMPLICATIONS:  None ENDOSCOPIC IMPRESSION: 1) 4 mm sessile polyp in the ascending colon 2) Otherwise normal examination RECOMMENDATIONS: 1) If the polyp(s) removed today are proven to be adenomatous (pre-cancerous) polyps, you will need a repeat colonoscopy in 5 years. Otherwise you should continue to follow colorectal cancer screening guidelines for "routine risk" patients with colonoscopy in 10 years. You will receive a letter within 1-2 weeks with the results of  your biopsy as well as final recommendations. Please call my office if you have not received a letter after 3 weeks. REPEAT EXAM:  You will receive a letter from Dr. Arlyce Dice in 1-2 weeks, after reviewing the final pathology, with followup recommendations.  ______________________________ Barbette Hair Arlyce Dice, MD  CC:  n. eSIGNED:   Barbette Hair. Ilo Beamon at 05/15/2011 10:31 AM  Wendall Mola, [phone]

## 2011-05-16 ENCOUNTER — Telehealth: Payer: Self-pay | Admitting: *Deleted

## 2011-05-16 NOTE — Telephone Encounter (Signed)
  Follow up Call-  Call back number 05/15/2011  Post procedure Call Back phone  # [phone]  Permission to leave phone message Yes     Patient questions:  Do you have a fever, pain , or abdominal swelling? no Pain Score  0 *  Have you tolerated food without any problems? yes  Have you been able to return to your normal activities? yes  Do you have any questions about your discharge instructions: Diet   no Medications  no Follow up visit  no  Do you have questions or concerns about your Care? no  Actions: * If pain score is 4 or above: No action needed, pain <4.

## 2011-05-19 ENCOUNTER — Other Ambulatory Visit: Payer: Self-pay

## 2011-05-19 DIAGNOSIS — F988 Other specified behavioral and emotional disorders with onset usually occurring in childhood and adolescence: Secondary | ICD-10-CM

## 2011-05-19 MED ORDER — METHYLPHENIDATE HCL 20 MG PO TABS: 20.0000 mg | ORAL_TABLET | Freq: Three times a day (TID) | ORAL | Status: DC

## 2011-05-19 NOTE — Telephone Encounter (Signed)
Called the patient  Informed prescription requested is ready for pickup.

## 2011-05-19 NOTE — Telephone Encounter (Signed)
Done hardcopy to robin  

## 2011-05-21 ENCOUNTER — Ambulatory Visit: Payer: BC Managed Care – PPO | Admitting: Internal Medicine

## 2011-05-26 ENCOUNTER — Encounter: Payer: Self-pay | Admitting: Gastroenterology

## 2011-05-26 ENCOUNTER — Ambulatory Visit (INDEPENDENT_AMBULATORY_CARE_PROVIDER_SITE_OTHER): Payer: BC Managed Care – PPO | Admitting: Internal Medicine

## 2011-05-26 ENCOUNTER — Encounter: Payer: Self-pay | Admitting: Internal Medicine

## 2011-05-26 VITALS — BP 102/68 | HR 126 | Temp 98.3°F | Ht 67.5 in | Wt 157.0 lb

## 2011-05-26 DIAGNOSIS — I951 Orthostatic hypotension: Secondary | ICD-10-CM

## 2011-05-26 DIAGNOSIS — R55 Syncope and collapse: Secondary | ICD-10-CM

## 2011-05-26 DIAGNOSIS — Z0000 Encounter for general adult medical examination without abnormal findings: Secondary | ICD-10-CM

## 2011-05-26 NOTE — Assessment & Plan Note (Signed)

## 2011-05-26 NOTE — Patient Instructions (Signed)
Please remember to followup with your GYN for the yearly pap smear and/or mammogram (consider [facility], and [facility] - please call to make your appointments) Continue all other medications as before No further blood work to do today Please keep your appointments with your specialists as you have planned - endocrinology You will be contacted regarding the referral for: cardiology Please return in 1 year for your yearly visit, or sooner if needed

## 2011-05-26 NOTE — Assessment & Plan Note (Addendum)
ECG reviewed as per emr, likely related to orthostasis vs other - echo/carotids neg, for card eval

## 2011-05-26 NOTE — Progress Notes (Signed)
Subjective:    Patient ID: Lynn Barnes Comment, female    DOB: August 09, 1960, 51 y.o.   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  Still working as Optometrist at [employer], but thinking of applying for disability for DM/orthostasis recurrent and emotioinal impairment.    No recent fever, pain, or dehydration.  No overt  Bleeding, bruising or other siezure.   Had labs for sugar,chol drawn this am, has f/u with endo April 17. Does not want further labs today.  Here for wellness as well; Pt denies CP, worsening SOB, DOE, wheezing, orthopnea, PND, worsening LE edema, but has recurrent palpitations and dizziness/  Pt denies neurological change such as new Headache, facial or extremity weakness.  Pt denies polydipsia, polyuria, or low sugar symptoms. Pt states overall good compliance with treatment and medications, good tolerability, and trying to follow lower cholesterol diet.  Pt denies worsening depressive symptoms, suicidal ideation or panic. No fever, wt loss, night sweats, loss of appetite, or other constitutional symptoms.  Pt states good ability with ADL's, low fall risk, home safety reviewed and adequate, no significant changes in hearing or vision.   Past Medical History  Diagnosis Date  . Rotator cuff syndrome of left shoulder   . S/P partial hysterectomy   . Addiction, opium   . Therapy     on therapy by psychiatrist using sebutex   . 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 IBS (irritable bowel syndrome) 02/15/2011  . Bipolar disorder 02/15/2011  . Anxiety   . Depression   . Diabetes mellitus     sees Dr Altheimer/endo  . Thyroid nodule     s/p biopsy - benign approx jan 2012  . Insomnia  02/21/2011  . Subacute dyskinesia due to drugs     dr dohmeier/neuro  . Resting tremor   . Cervical spondylosis without myelopathy   . Orthostatic hypotension   . Allergy     spring  . Hyperlipidemia    Past Surgical History  Procedure Date  . Ooporectomy   . Herniated disc cervical spine   . Left acromioplasty 2002    Dr Darrelyn Hillock  . Right breast lumpectomy   . Spine surgery feb 2012    c5-6 fusion, Dr Shon Baton    reports that she has never smoked. She has never used smokeless tobacco. She reports that she does not drink alcohol or use illicit drugs. family history includes Cancer in her mother; Heart disease in her father; and Hypertension in her father. Allergies  Allergen Reactions  . Nsaids Other (See Comments)    Irritates stomach  . Sulfonamide Derivatives Rash   Current Outpatient Prescriptions on File Prior to Visit  Medication Sig Dispense Refill  . atorvastatin (LIPITOR) 40 MG tablet Take 1 tablet (40 mg total) by mouth daily.  30 tablet  11  . clonazePAM (KLONOPIN) 1 MG tablet Take 1 mg by mouth Twice daily.      . insulin glargine (LANTUS SOLOSTAR) 100 UNIT/ML injection Inject 50 Units into the skin daily. 50 units Dr. Leslie Dales      . insulin glulisine (APIDRA SOLOSTAR) 100 UNIT/ML injection Inject into the  skin. As needed Dr. Berenda Morale       . methylphenidate (RITALIN) 20 MG tablet Take 1 tablet (20 mg total) by mouth 3 (three) times daily. To fill May 20, 2011  90 tablet  0  . risperiDONE (RISPERDAL) 3 MG tablet Take 1 tablet (3 mg total) by mouth 2 (two) times daily.  60 tablet  5  . VIIBRYD 40 MG TABS Take 40 mg by mouth Daily.      . methylphenidate (RITALIN) 20 MG tablet Take 1 tablet (20 mg total) by mouth 3 (three) times daily.  90 tablet  0  . methylphenidate (RITALIN) 20 MG tablet Take 1 tablet (20 mg total) by mouth 3 (three) times daily.  90 tablet  0  . methylphenidate (RITALIN) 20 MG tablet Take 1 tablet (20 mg total) by mouth 3 (three) times daily.  90  tablet  0  . methylphenidate (RITALIN) 20 MG tablet Take 1 tablet (20 mg total) by mouth 2 (two) times daily.  90 tablet  0  . zolpidem (AMBIEN CR) 12.5 MG CR tablet Take 1 tablet (12.5 mg total) by mouth at bedtime as needed for sleep.  30 tablet  5  . zolpidem (AMBIEN) 10 MG tablet Take 1 tablet (10 mg total) by mouth at bedtime as needed for sleep.  30 tablet  5  . zolpidem (AMBIEN) 10 MG tablet Take 1 tablet (10 mg total) by mouth at bedtime as needed.  30 tablet  5   Review of Systems Review of Systems  Constitutional: Negative for diaphoresis, activity change, appetite change and unexpected weight change.  HENT: Negative for hearing loss, ear pain, facial swelling, mouth sores and neck stiffness.   Eyes: Negative for pain, redness and visual disturbance.  Respiratory: Negative for shortness of breath and wheezing.   Cardiovascular: Negative for chest pain and palpitations.  Gastrointestinal: Negative for diarrhea, blood in stool, abdominal distention and rectal pain.  Genitourinary: Negative for hematuria, flank pain and decreased urine volume.  Musculoskeletal: Negative for myalgias and joint swelling.  Skin: Negative for color change and wound.  Neurological: Negative for syncope and numbness.  Hematological: Negative for adenopathy.  Psychiatric/Behavioral: Negative for hallucinations, self-injury, decreased concentration and agitation.     Objective:   Physical Exam BP 102/68  Pulse 126  Temp(Src) 98.3 F (36.8 C) (Oral)  Ht 5' 7.5" (1.715 m)  Wt 157 lb (71.215 kg)  BMI 24.23 kg/m2  SpO2 97% Physical Exam  VS noted Constitutional: Pt is oriented to person, place, and time. Appears well-developed and well-nourished.  HENT:  Head: Normocephalic and atraumatic.  Right Ear: External ear normal.  Left Ear: External ear normal.  Nose: Nose normal.  Mouth/Throat: Oropharynx is clear and moist.  Eyes: Conjunctivae and EOM are normal. Pupils are equal, round, and reactive to  light.  Neck: Normal range of motion. Neck supple. No JVD present. No tracheal deviation present.  Cardiovascular: Normal rate, regular rhythm, normal heart sounds and intact distal pulses.   Pulmonary/Chest: Effort normal and breath sounds normal.  Abdominal: Soft. Bowel sounds are normal. There is no tenderness.  Musculoskeletal: Normal range of motion. Exhibits no edema.  Lymphadenopathy:  Has no cervical adenopathy.  Neurological: Pt is alert and oriented to person, place, and time. Pt has normal reflexes. No cranial nerve deficit.  Skin: Skin is warm and dry. No rash noted.  Psychiatric:  Has  normal mood and affect. Behavior is normal. but blunted affect noted, cognition somewhat slowed on current med  Assessment & Plan:

## 2011-05-26 NOTE — Assessment & Plan Note (Signed)
Likely the etiology of her tachycardia, to card for further eval if needed, pt plans to apply for disability, has not seen cardiology

## 2011-06-10 ENCOUNTER — Telehealth: Payer: Self-pay | Admitting: *Deleted

## 2011-06-10 DIAGNOSIS — Z0279 Encounter for issue of other medical certificate: Secondary | ICD-10-CM

## 2011-06-10 NOTE — Telephone Encounter (Signed)
To robin   

## 2011-06-10 NOTE — Telephone Encounter (Signed)
Requesting to speak with robin have ? Concerning disability forms... 06/10/11@11 :35am/LMB

## 2011-06-11 NOTE — Telephone Encounter (Signed)
Forms are on MD's desk to sign.

## 2011-06-16 ENCOUNTER — Ambulatory Visit: Payer: BC Managed Care – PPO | Admitting: Cardiovascular Disease

## 2011-06-23 ENCOUNTER — Ambulatory Visit: Payer: BC Managed Care – PPO | Admitting: Cardiovascular Disease

## 2011-06-24 ENCOUNTER — Encounter: Payer: Self-pay | Admitting: Internal Medicine

## 2011-07-08 ENCOUNTER — Other Ambulatory Visit: Payer: Self-pay

## 2011-07-08 DIAGNOSIS — F988 Other specified behavioral and emotional disorders with onset usually occurring in childhood and adolescence: Secondary | ICD-10-CM

## 2011-07-08 MED ORDER — ZOLPIDEM TARTRATE 10 MG PO TABS: 10.0000 mg | ORAL_TABLET | Freq: Every evening | ORAL | Status: DC | PRN

## 2011-07-08 MED ORDER — METHYLPHENIDATE HCL 20 MG PO TABS: 20.0000 mg | ORAL_TABLET | Freq: Three times a day (TID) | ORAL | Status: DC

## 2011-07-08 NOTE — Telephone Encounter (Signed)
Called the patient informed prescriptions are ready for pickup at the front desk. 

## 2011-07-08 NOTE — Telephone Encounter (Signed)
Done hardcopy to robin  

## 2011-07-21 ENCOUNTER — Other Ambulatory Visit: Payer: Self-pay | Admitting: *Deleted

## 2011-07-21 ENCOUNTER — Other Ambulatory Visit: Payer: Self-pay | Admitting: Internal Medicine

## 2011-07-21 DIAGNOSIS — Z1231 Encounter for screening mammogram for malignant neoplasm of breast: Secondary | ICD-10-CM

## 2011-07-21 NOTE — Telephone Encounter (Signed)
Denied-Alprazolam request-see 05.28.13 phone note; hard copy given [w/Ritalin script], picked up by patient/SLS

## 2011-07-24 ENCOUNTER — Other Ambulatory Visit: Payer: Self-pay | Admitting: Family Medicine

## 2011-07-24 NOTE — Telephone Encounter (Signed)
Done per emr 

## 2011-08-01 ENCOUNTER — Ambulatory Visit
Admission: RE | Admit: 2011-08-01 | Discharge: 2011-08-01 | Disposition: A | Discharge status: Home / Self Care | Payer: BC Managed Care – PPO | Source: Ambulatory Visit | Attending: Internal Medicine | Admitting: Internal Medicine

## 2011-08-01 DIAGNOSIS — Z1231 Encounter for screening mammogram for malignant neoplasm of breast: Secondary | ICD-10-CM

## 2011-08-11 ENCOUNTER — Other Ambulatory Visit: Payer: Self-pay

## 2011-08-11 DIAGNOSIS — F988 Other specified behavioral and emotional disorders with onset usually occurring in childhood and adolescence: Secondary | ICD-10-CM

## 2011-08-11 MED ORDER — METHYLPHENIDATE HCL 20 MG PO TABS: 20.0000 mg | ORAL_TABLET | Freq: Three times a day (TID) | ORAL | Status: DC

## 2011-08-11 NOTE — Telephone Encounter (Signed)
Called the patient informed prescription requested is ready for pickup at the front desk. 

## 2011-08-11 NOTE — Telephone Encounter (Signed)
Done hardcopy to robin  

## 2011-09-09 ENCOUNTER — Other Ambulatory Visit: Payer: Self-pay

## 2011-09-09 DIAGNOSIS — F988 Other specified behavioral and emotional disorders with onset usually occurring in childhood and adolescence: Secondary | ICD-10-CM

## 2011-09-09 MED ORDER — METHYLPHENIDATE HCL 20 MG PO TABS: 20.0000 mg | ORAL_TABLET | Freq: Three times a day (TID) | ORAL | Status: DC

## 2011-09-09 NOTE — Telephone Encounter (Signed)
Patient informed prescription is ready f or pickup at the front desk. 

## 2011-09-09 NOTE — Telephone Encounter (Signed)
Done hardcopy to robin  

## 2011-09-16 ENCOUNTER — Encounter: Payer: Self-pay | Admitting: Internal Medicine

## 2011-09-16 ENCOUNTER — Telehealth: Payer: Self-pay

## 2011-09-16 ENCOUNTER — Ambulatory Visit (INDEPENDENT_AMBULATORY_CARE_PROVIDER_SITE_OTHER): Payer: BC Managed Care – PPO | Admitting: Internal Medicine

## 2011-09-16 VITALS — BP 100/68 | HR 111 | Temp 97.3°F | Ht 67.5 in | Wt 154.0 lb

## 2011-09-16 DIAGNOSIS — R42 Dizziness and giddiness: Secondary | ICD-10-CM

## 2011-09-16 DIAGNOSIS — I951 Orthostatic hypotension: Secondary | ICD-10-CM

## 2011-09-16 DIAGNOSIS — F988 Other specified behavioral and emotional disorders with onset usually occurring in childhood and adolescence: Secondary | ICD-10-CM

## 2011-09-16 DIAGNOSIS — Z0000 Encounter for general adult medical examination without abnormal findings: Secondary | ICD-10-CM

## 2011-09-16 DIAGNOSIS — F411 Generalized anxiety disorder: Secondary | ICD-10-CM

## 2011-09-16 MED ORDER — MIDODRINE HCL 2.5 MG PO TABS: 2.5000 mg | ORAL_TABLET | Freq: Three times a day (TID) | ORAL | Status: AC

## 2011-09-16 MED ORDER — RISPERIDONE 2 MG PO TABS: 2.0000 mg | ORAL_TABLET | Freq: Every day | ORAL | Status: AC

## 2011-09-16 MED ORDER — LISDEXAMFETAMINE DIMESYLATE 50 MG PO CAPS: 50.0000 mg | ORAL_CAPSULE | ORAL | Status: DC

## 2011-09-16 NOTE — Telephone Encounter (Signed)
Ok for PA, ritalin no longer effective with daily interrmittent symptoms between dosing high strengths

## 2011-09-16 NOTE — Patient Instructions (Addendum)
Ok to stop the ritalin Take all new medications as prescribed - the vyvanse 50 mg per day, and the midodrine 2.5 mg three times per day Continue all other medications as before Please have the pharmacy call with any refills you may need. You will be contacted regarding the referral for: neurology Your form was filled out today Please return in 6 mo with Lab testing done 3-5 days before

## 2011-09-16 NOTE — Telephone Encounter (Signed)
PA required for Vyvanse, please advise

## 2011-09-17 ENCOUNTER — Telehealth: Payer: Self-pay

## 2011-09-17 MED ORDER — METHYLPHENIDATE HCL ER (OSM) 54 MG PO TBCR: 54.0000 mg | EXTENDED_RELEASE_TABLET | ORAL | Status: DC

## 2011-09-17 NOTE — Telephone Encounter (Signed)
Patient called and informed to pickup prescription at the front desk.

## 2011-09-17 NOTE — Telephone Encounter (Signed)
Ok to try change to concerta as this is now generic  Done hardcopy to [employer]

## 2011-09-17 NOTE — Telephone Encounter (Signed)
Called [facility] to start PA on medication.  PA was approved from 08/27/11 through 09/16/2012.  Approval letter will be faxed to our office and patient notified.  Called [facility] pharmacy informed of approval. Once fax has been received will fax a copy to the pharmacy and put a copy in [facility] PA approval folder and send to scan as well. ID # [id number].  Called the patient left detailed message medication approved.

## 2011-09-17 NOTE — Telephone Encounter (Signed)
Pt called stating that Vyvanse that was prescribed at yesterday's office visit os too expensive. Pt is requesting a cheaper alternative, please advise.

## 2011-09-19 ENCOUNTER — Telehealth: Payer: Self-pay

## 2011-09-19 NOTE — Telephone Encounter (Signed)
Received pharmacy rejection stating that insurance will not cover methylphenidate without a prior authorization. Called [employer] provided clinical info and medication approved 08/29/11-09/18/12 (case# [id number]). Pharmacy notified via fax, and insurance to notify pt

## 2011-09-21 ENCOUNTER — Encounter: Payer: Self-pay | Admitting: Internal Medicine

## 2011-09-21 NOTE — Progress Notes (Signed)
Subjective:    Patient ID: Lynn Barnes, female    DOB: [birth year], 51 y.o.   MRN: [id number]  HPI  Here to f/u, has contd to work as Optometrist, now off for the summer, but unfortunately in the past month has had numerous episodes orthostatic symptoms now daily with mild exertion only such as marked weakness/sob/elev HR and dizziness with light housework.  No recent syncope, and Pt denies chest pain, increased sob or doe, wheezing, orthopnea, PND, increased LE swelling, palpitations, syncope.  Asks for ST disabilty form signed as she feels not able to go back to work.  Has not tried med in the past such as midodrine.  Recent echo jan 2013 with normal EF.  No recent overt bleeding or bruising such as menorrhoagia.  No falls or injury, or other neurological symptoms  - Pt denies new neurological symptoms such as new headache, or facial or extremity weakness or numbness.  Denies worsening depressive symptoms, suicidal ideation, or panic. Does ask for change ritalin as she has breakthrough symtpoms during the day when med wears off between dosing.  Sees endo for DM Past Medical History  Diagnosis Date  . Rotator cuff syndrome of left shoulder   . S/P partial hysterectomy   . Addiction, opium   . Therapy     on therapy by psychiatrist using sebutex   . 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 IBS (irritable bowel syndrome) 02/15/2011  . Bipolar disorder 02/15/2011  . Anxiety   . Depression   . Diabetes mellitus     sees Dr Altheimer/endo  . Thyroid nodule     s/p biopsy - benign approx jan 2012  . Insomnia 02/21/2011  . Subacute dyskinesia due to drugs     dr dohmeier/neuro  . Resting tremor   . Cervical spondylosis without myelopathy   . Orthostatic hypotension   . Allergy     spring  . Hyperlipidemia    Past Surgical History  Procedure Date  . Ooporectomy   . Herniated disc cervical spine   . Left acromioplasty 2002    Dr Darrelyn Hillock  . Right breast  lumpectomy   . Spine surgery feb 2012    c5-6 fusion, Dr Shon Baton    reports that she has never smoked. She has never used smokeless tobacco. She reports that she does not drink alcohol or use illicit drugs. family history includes Cancer in her mother; Heart disease in her father; and Hypertension in her father. Allergies  Allergen Reactions  . Nsaids Other (See Comments)    Irritates stomach  . Sulfonamide Derivatives Rash   Current Outpatient Prescriptions on File Prior to Visit  Medication Sig Dispense Refill  . atorvastatin (LIPITOR) 40 MG tablet TAKE 1 TABLET (40 MG TOTAL) BY MOUTH DAILY.  90 tablet  3  . clonazePAM (KLONOPIN) 1 MG tablet Take 1 mg by mouth Twice daily.      . insulin glargine (LANTUS SOLOSTAR) 100 UNIT/ML injection Inject 50 Units into the skin daily. 50 units Dr. Leslie Dales      . insulin glulisine (APIDRA SOLOSTAR) 100 UNIT/ML injection Inject into the skin. As needed Dr. Berenda Morale       . VIIBRYD 40 MG TABS Take 40 mg by mouth Daily.      [name] zolpidem (AMBIEN) 10 MG tablet Take 1 tablet (10 mg total) by mouth at bedtime as needed.  30 tablet  5  . methylphenidate (CONCERTA) 54 MG CR  tablet Take 1 tablet (54 mg total) by mouth every morning.  30 tablet  0  . zolpidem (AMBIEN CR) 12.5 MG CR tablet Take 1 tablet (12.5 mg total) by mouth at bedtime as needed for sleep.  30 tablet  5  . zolpidem (AMBIEN) 10 MG tablet Take 1 tablet (10 mg total) by mouth at bedtime as needed for sleep.  30 tablet  5   Review of Systems Review of Systems  Constitutional: Negative for diaphoresis and unexpected weight change.  HENT: Negative for drooling and tinnitus.   Eyes: Negative for photophobia and visual disturbance.  Respiratory: Negative for choking and stridor.   Gastrointestinal: Negative for vomiting and blood in stool.  Genitourinary: Negative for hematuria and decreased urine volume.  Musculoskeletal: Negative for acute joint swelling Skin: Negative for color change and  wound.  Neurological: Negative for tremors and numbness.  Psychiatric/Behavioral: Negative for decreased concentration. The patient is not hyperactive.      Objective:   Physical Exam BP 100/68  Pulse 111  Temp 97.3 F (36.3 C) (Oral)  Ht 5' 7.5" (1.715 m)  Wt 154 lb (69.854 kg)  BMI 23.76 kg/m2  SpO2 96% Physical Exam  VS noted Constitutional: Pt appears well-developed and well-nourished.  HENT: Head: Normocephalic.  Right Ear: External ear normal.  Left Ear: External ear normal.  Eyes: Conjunctivae and EOM are normal. Pupils are equal, round, and reactive to light.  Neck: Normal range of motion. Neck supple.  Cardiovascular: Normal rate and regular rhythm.   Pulmonary/Chest: Effort normal and breath sounds normal.  Abd:  Soft, NT, non-distended, + BS Neurological: Pt is alert. No cranial nerve deficit. motor/dtr/gait/sens intact Skin: Skin is warm. No erythema.  Psychiatric: Pt behavior is normal. Thought content normal. 1+ nervous    Assessment & Plan:

## 2011-09-21 NOTE — Assessment & Plan Note (Signed)
Jan 2013; per pt per neurology, not on cortef, hx of syncope x 2 in 2012 with increased symptoms, for midodrine low dose, refer neurology, form filled out for ST disability;  Pt states this should cover her until the spring when she would then be able to apply for LT disability

## 2011-09-21 NOTE — Assessment & Plan Note (Signed)
For change to vyvanse 50 qd, pt warned of cost but willing to see if can afford

## 2011-09-21 NOTE — Assessment & Plan Note (Signed)
stable overall by hx and exam, most recent data reviewed with pt, and pt to continue medical treatment as before Lab Results  Component Value Date   WBC 6.0 03/22/2010   HGB 13.4 03/22/2010   HCT 38.8 03/22/2010   PLT 259 03/22/2010   GLUCOSE 169* 03/27/2010   NA 137 03/22/2010   K 4.0 03/22/2010   CL 104 03/22/2010   CREATININE 0.84 03/22/2010   BUN 20 03/22/2010   CO2 27 03/22/2010   TSH 3.01 02/14/2010   HGBA1C  Value: 7.2 (NOTE)                                                                       According to the ADA Clinical Practice Recommendations for 2011, when HbA1c is used as a screening test:   >=6.5%   Diagnostic of Diabetes Mellitus           (if abnormal result  is confirmed)  5.7-6.4%   Increased risk of developing Diabetes Mellitus  References:Diagnosis and Classification of Diabetes Mellitus,Diabetes Care,2011,34(Suppl 1):S62-S69 and Standards of Medical Care in         Diabetes - 2011,Diabetes Care,2011,34  (Suppl 1):S11-S61.* 03/28/2010

## 2011-10-15 DIAGNOSIS — Z0279 Encounter for issue of other medical certificate: Secondary | ICD-10-CM

## 2011-10-24 ENCOUNTER — Telehealth: Payer: Self-pay | Admitting: Internal Medicine

## 2011-10-24 ENCOUNTER — Encounter: Payer: Self-pay | Admitting: Internal Medicine

## 2011-10-24 DIAGNOSIS — G2401 Drug induced subacute dyskinesia: Secondary | ICD-10-CM

## 2011-10-24 DIAGNOSIS — G901 Familial dysautonomia [Riley-Day]: Secondary | ICD-10-CM

## 2011-10-24 HISTORY — DX: Familial dysautonomia (riley-day): G90.1

## 2011-10-24 NOTE — Telephone Encounter (Signed)
Received fax/information per Dr Salena Saner Dohmeir/neuro who asks for cardiac monitor for 3 days to assess for tachycardia  Will order  Robin to let pt know, she should receive a call about this

## 2011-10-27 NOTE — Telephone Encounter (Signed)
Patient informed. 

## 2011-11-05 ENCOUNTER — Other Ambulatory Visit: Payer: Self-pay | Admitting: *Deleted

## 2011-11-05 MED ORDER — METHYLPHENIDATE HCL ER (OSM) 54 MG PO TBCR: 54.0000 mg | EXTENDED_RELEASE_TABLET | ORAL | Status: DC

## 2011-11-05 NOTE — Telephone Encounter (Signed)
Patient informed. 

## 2011-11-05 NOTE — Telephone Encounter (Signed)
Pt called to request refill of Concerta (pt states she tried to contact pharmacy as the message states, but was told by pharmacy to call office for refill)-last written 09/17/2011 #30 with 0 refills-please advise.

## 2011-11-05 NOTE — Telephone Encounter (Signed)
Done hardcopy to robin  

## 2011-11-17 DIAGNOSIS — R000 Tachycardia, unspecified: Secondary | ICD-10-CM

## 2011-11-26 ENCOUNTER — Telehealth: Payer: Self-pay

## 2011-11-26 MED ORDER — ZOLPIDEM TARTRATE ER 12.5 MG PO TBCR: 12.5000 mg | EXTENDED_RELEASE_TABLET | Freq: Every evening | ORAL | Status: DC | PRN

## 2011-11-26 NOTE — Telephone Encounter (Signed)
Done hardcopy to robin  

## 2011-11-26 NOTE — Telephone Encounter (Signed)
Pt called reporting difficulty staying asleep once she does fall asleep. Pt is requesting Ambien CR 12.5, please advise.

## 2011-12-03 ENCOUNTER — Encounter: Payer: Self-pay | Admitting: Internal Medicine

## 2011-12-03 ENCOUNTER — Other Ambulatory Visit: Payer: Self-pay | Admitting: Internal Medicine

## 2011-12-03 DIAGNOSIS — F22 Delusional disorders: Secondary | ICD-10-CM

## 2011-12-03 DIAGNOSIS — I951 Orthostatic hypotension: Secondary | ICD-10-CM

## 2011-12-03 DIAGNOSIS — R000 Tachycardia, unspecified: Secondary | ICD-10-CM

## 2011-12-08 ENCOUNTER — Encounter: Payer: Self-pay | Admitting: Internal Medicine

## 2011-12-08 ENCOUNTER — Other Ambulatory Visit: Payer: Self-pay

## 2011-12-08 MED ORDER — METHYLPHENIDATE HCL ER (OSM) 54 MG PO TBCR: 54.0000 mg | EXTENDED_RELEASE_TABLET | ORAL | Status: DC

## 2011-12-08 NOTE — Telephone Encounter (Signed)
Patient informed to pickup hardcopy of prescription at the front desk. 

## 2011-12-08 NOTE — Telephone Encounter (Signed)
Done hardcopy to robin  

## 2012-01-02 ENCOUNTER — Ambulatory Visit (INDEPENDENT_AMBULATORY_CARE_PROVIDER_SITE_OTHER): Payer: BC Managed Care – PPO | Admitting: Internal Medicine

## 2012-01-02 VITALS — BP 114/80 | HR 124 | Ht 67.0 in | Wt 162.0 lb

## 2012-01-02 DIAGNOSIS — R42 Dizziness and giddiness: Secondary | ICD-10-CM

## 2012-01-02 NOTE — Progress Notes (Addendum)
HPI Patient is a 51 year old who was referred fro evaluation of orthostatic hypotenision.  She has been followed by Drs. John and Dohrmeir.  She also carries dx of possible Parkinson's  (drug induced)  No records. The patient says her problems began about 1 year ago  She feels dizzy often, has problems with change in position.  She denies frank syncoped but says she will have spells where she falls and cannot get up.  Last occurred 3 days ago. Episodes are unpredictable, good days then bad. SHe has been tried on a medicine but cannot recall.  Allergies  Allergen Reactions  . Nsaids Other (See Comments)    Irritates stomach  . Sulfonamide Derivatives Rash    Current Outpatient Prescriptions  Medication Sig Dispense Refill  . atorvastatin (LIPITOR) 40 MG tablet TAKE 1 TABLET (40 MG TOTAL) BY MOUTH DAILY.  90 tablet  3  . buPROPion (WELLBUTRIN) 100 MG tablet Take 100 mg by mouth 2 (two) times daily.      . insulin glargine (LANTUS SOLOSTAR) 100 UNIT/ML injection Inject 50 Units into the skin daily. 50 units Dr. Leslie Dales      . insulin glulisine (APIDRA SOLOSTAR) 100 UNIT/ML injection Inject into the skin. As needed Dr. Berenda Morale       . risperiDONE (RISPERDAL) 2 MG tablet Take 2 mg by mouth daily.      [name] VIIBRYD 40 MG TABS Take 40 mg by mouth Daily.        Past Medical History  Diagnosis Date  . Rotator cuff syndrome of left shoulder   . S/P partial hysterectomy   . Addiction, opium   . Therapy     on therapy by psychiatrist using sebutex   . 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 IBS (irritable bowel syndrome) 02/15/2011  . Bipolar disorder 02/15/2011  . Anxiety   . Depression   . Diabetes mellitus     sees Dr Altheimer/endo  . Thyroid nodule     s/p biopsy - benign approx jan 2012  . Insomnia 02/21/2011  . Subacute dyskinesia due to drugs(333.85)     dr dohmeier/neuro  . Resting tremor   . Cervical spondylosis without myelopathy   .  Orthostatic hypotension   . Allergy     spring  . Hyperlipidemia   . Dysautonomia 10/24/2011    Past Surgical History  Procedure Date  . Ooporectomy   . Herniated disc cervical spine   . Left acromioplasty 2002    Dr Darrelyn Hillock  . Right breast lumpectomy   . Spine surgery feb 2012    c5-6 fusion, Dr Shon Baton    Family History  Problem Relation Age of Onset  . Cancer Mother   . Heart disease Father   . Hypertension Father     History   Social History  . Marital Status: Single    Spouse Name: N/A    Number of Children: N/A  . Years of Education: N/A   Occupational History  . Not on file.   Social History Main Topics  . Smoking status: Never Smoker   . Smokeless tobacco: Never Used  . Alcohol Use: No  . Drug Use: No  . Sexually Active: No   Other Topics Concern  . Not on file   Social History Narrative  . No narrative on file    Review of Systems:  All systems reviewed.  They are negative to the above problem except as previously  stated.  Vital Signs: BP 114/80  Pulse 124  Ht 5\' 7"  (1.702 m)  Wt 162 lb (73.483 kg)  BMI 25.37 kg/m2  Physical Exam Patient is in NAD  BP lying:  110/74 P 120 Sitting:  BP 110/809 P 124  Dizzy Standing BP 96/80 P 124 Standing 4 min BP 110/80  P136  Feels better HEENT:  Normocephalic, atraumatic. EOMI, PERRLA.  Neck: JVP is normal.  No bruits.  Lungs: clear to auscultation. No rales no wheezes.  Heart: Regular rate and rhythm. Normal S1, S2. No S3.   No significant murmurs. PMI not displaced.  Abdomen:  Supple, nontender. Normal bowel sounds. No masses. No hepatomegaly.  Extremities:   Good distal pulses throughout. No lower extremity edema.  Musculoskeletal :moving all extremities.  Neuro:   alert and oriented x3.  CN II-XII grossly intact.  EKG:  ST 124 bpm Assessment and Plan:  1.  Autonomic dysfunction.  Patient with definite autonomic dysfunction I will need to review records from neuro re meds she has tried as well  as Dx of parkinsons For now try increasing salt and fluids   Addendum: Records reviewed from [facility] Neuro Patient was begun on Florinef for POTS She has appt there in f/u for reevaluation. Will set f/u for 2 to 3 mon

## 2012-01-03 LAB — URINALYSIS
Hgb urine dipstick: NEGATIVE
Leukocytes, UA: NEGATIVE
Nitrite: NEGATIVE
Specific Gravity, Urine: 1.026 (ref 1.005–1.030)
Urobilinogen, UA: 0.2 mg/dL (ref 0.0–1.0)
pH: 5 (ref 5.0–8.0)

## 2012-02-09 ENCOUNTER — Telehealth: Payer: Self-pay

## 2012-02-09 MED ORDER — ZOLPIDEM TARTRATE 10 MG PO TABS: 10.0000 mg | ORAL_TABLET | Freq: Every evening | ORAL | Status: DC | PRN

## 2012-02-09 NOTE — Telephone Encounter (Signed)
Ok x 1 mo Sch OV w/Dr Judeth Porch

## 2012-02-09 NOTE — Telephone Encounter (Signed)
Called the pharmacy and phoned refill in per MD instructions.  Called the patient to schedule appt.  The patient already has appointment scheduled for 03/16/12.

## 2012-02-09 NOTE — Telephone Encounter (Signed)
[facility] requesting a refill on Zolpidem Tartrate 10 mg please advise is not on current list.

## 2012-03-03 ENCOUNTER — Ambulatory Visit: Payer: Self-pay | Admitting: Internal Medicine

## 2012-03-05 ENCOUNTER — Ambulatory Visit (INDEPENDENT_AMBULATORY_CARE_PROVIDER_SITE_OTHER): Payer: BC Managed Care – PPO | Admitting: Internal Medicine

## 2012-03-05 ENCOUNTER — Encounter: Payer: Self-pay | Admitting: Internal Medicine

## 2012-03-05 VITALS — BP 110/80 | HR 138 | Temp 98.0°F | Ht 67.0 in | Wt 165.1 lb

## 2012-03-05 DIAGNOSIS — E109 Type 1 diabetes mellitus without complications: Secondary | ICD-10-CM

## 2012-03-05 DIAGNOSIS — G90A Postural orthostatic tachycardia syndrome (POTS): Secondary | ICD-10-CM

## 2012-03-05 DIAGNOSIS — F411 Generalized anxiety disorder: Secondary | ICD-10-CM

## 2012-03-05 DIAGNOSIS — I951 Orthostatic hypotension: Secondary | ICD-10-CM

## 2012-03-05 DIAGNOSIS — R000 Tachycardia, unspecified: Secondary | ICD-10-CM

## 2012-03-05 MED ORDER — MIDODRINE HCL 5 MG PO TABS: 5.0000 mg | ORAL_TABLET | Freq: Three times a day (TID) | ORAL | Status: DC

## 2012-03-05 NOTE — Patient Instructions (Addendum)
Please take all new medication as prescribed Please keep your appointments with your specialists as you have planned - neurology and endocrinology Your form will be filled out as requested No need for further lab work today Thank you for enrolling in MyChart. Please follow the instructions below to securely access your online medical record. MyChart allows you to send messages to your doctor, view your test results, renew your prescriptions, schedule appointments, and more. To Log into My Chart online, please go by [employer] or [employer] to [email or web address], or download the MyChart App from the [employer] of [employer] .  Your Username is: [email or web address] ([email or web address]) Please send a practice Message on Mychart later today Please return in 6 months, or sooner if needed, with Lab testing done 3-5 days before

## 2012-03-06 ENCOUNTER — Encounter: Payer: Self-pay | Admitting: Internal Medicine

## 2012-03-06 DIAGNOSIS — G90A Postural orthostatic tachycardia syndrome (POTS): Secondary | ICD-10-CM | POA: Insufficient documentation

## 2012-03-06 NOTE — Progress Notes (Signed)
Subjective:    Patient ID: Lynn Barnes, female    DOB: 10/10/1960, 52 y.o.   MRN: [id number]  HPI  Here to f/u with her parter/significant other;   Pt denies chest pain, increased sob or doe, wheezing, orthopnea, PND, increased LE swelling, palpitations, but still with ongoing dizziness c/w with her dysautonomia, affirmed by Neurology/Dr Dohmeir and card/Dr Tenny Craw.  Also follows closely with her DM with endo/Dr Fransico Michael.  Pt needs a form corrected in order to continue her current ST disability.  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  The last form filled out mentioned "ongoing" conditoin instead of "permanent" and must be corrected.   Of note is that she has been wary of taking florinef due to the listed side effect, and mentions a family member treated with this ended up with what sounds like pedal edema.  She has also been instread prescribed midodrine, has not started, may have lost the rx, but would be willing to consider this, was originally rx per neurology and happens to have f/u appt jan 30.  Pt denies polydipsia, polyuria, or low sugar symptoms such as weakness or confusion improved with po intake.  Pt denies new neurological symptoms such as new headache, or facial or extremity weakness or numbness.   Pt states overall good compliance with meds, has been trying to follow lower cholesterol, diabetic diet, with wt overall stable. Denies worsening depressive symptoms, suicidal ideation, or panic; has ongoing anxiety, stable recently Past Medical History  Diagnosis Date  . Rotator cuff syndrome of left shoulder   . S/P partial hysterectomy   . Addiction, opium   . Therapy     on therapy by psychiatrist using sebutex   . 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 IBS (irritable bowel syndrome) 02/15/2011  . Bipolar disorder  02/15/2011  . Anxiety   . Depression   . Diabetes mellitus     sees Dr Altheimer/endo  . Thyroid nodule     s/p biopsy - benign approx jan 2012  . Insomnia 02/21/2011  . Subacute dyskinesia due to drugs(333.85)     dr dohmeier/neuro  . Resting tremor   . Cervical spondylosis without myelopathy   . Orthostatic hypotension   . Allergy     spring  . Hyperlipidemia   . Dysautonomia 10/24/2011   Past Surgical History  Procedure Date  . Ooporectomy   . Herniated disc cervical spine   . Left acromioplasty 2002    Dr Darrelyn Hillock  . Right breast lumpectomy   . Spine surgery feb 2012    c5-6 fusion, Dr Shon Baton    reports that she has never smoked. She has never used smokeless tobacco. She reports that she does not drink alcohol or use illicit drugs. family history includes Cancer in her mother; Heart disease in her father; and Hypertension in her father. Allergies  Allergen Reactions  . Nsaids Other (See Comments)    Irritates stomach  . Sulfonamide Derivatives Rash   Current Outpatient Prescriptions on File Prior to Visit  Medication Sig Dispense Refill  . atorvastatin (LIPITOR) 40 MG tablet TAKE 1 TABLET (40 MG TOTAL) BY MOUTH DAILY.  90 tablet  3  . buPROPion (WELLBUTRIN) 100 MG tablet Take 100 mg by mouth 2 (two) times daily.      . insulin glargine (LANTUS  SOLOSTAR) 100 UNIT/ML injection Inject 50 Units into the skin daily. 50 units Dr. Leslie Dales      . insulin glulisine (APIDRA SOLOSTAR) 100 UNIT/ML injection Inject into the skin. As needed Dr. Berenda Morale       . risperiDONE (RISPERDAL) 2 MG tablet Take 2 mg by mouth daily.      [name] VIIBRYD 40 MG TABS Take 40 mg by mouth Daily.       Review of Systems  Constitutional: Negative for unexpected weight change, or unusual diaphoresis  HENT: Negative for tinnitus.   Eyes: Negative for photophobia and visual disturbance.  Respiratory: Negative for choking and stridor.   Gastrointestinal: Negative for vomiting and blood in stool.    Genitourinary: Negative for hematuria and decreased urine volume.  Musculoskeletal: Negative for acute joint swelling Skin: Negative for color change and wound.  Neurological: Negative for tremors and numbness other than noted  Psychiatric/Behavioral: Negative for decreased concentration or  hyperactivity.       Objective:   Physical Exam BP 110/80  Pulse 138  Temp 98 F (36.7 C) (Oral)  Ht 5\' 7"  (1.702 m)  Wt 165 lb 2 oz (74.9 kg)  BMI 25.86 kg/m2  SpO2 96% VS noted, not ill appearing Constitutional: Pt appears well-developed and well-nourished.  HENT: Head: NCAT.  Right Ear: External ear normal.  Left Ear: External ear normal.  Eyes: Conjunctivae and EOM are normal. Pupils are equal, round, and reactive to light.  Neck: Normal range of motion. Neck supple.  Cardiovascular: Normal rate and regular rhythm.   Pulmonary/Chest: Effort normal and breath sounds normal.  Neurological: Pt is alert. Not confused , motor/dtr intact Skin: Skin is warm. No erythema. No LE edema Psychiatric: Pt behavior is normal. Thought content normal.     Assessment & Plan:

## 2012-03-06 NOTE — Assessment & Plan Note (Signed)
stable overall by history and exam, recent data reviewed with pt, and pt to continue medical treatment as before,  to f/u any worsening symptoms or concerns Lab Results  Component Value Date   WBC 6.0 03/22/2010   HGB 13.4 03/22/2010   HCT 38.8 03/22/2010   PLT 259 03/22/2010   GLUCOSE 169* 03/27/2010   NA 137 03/22/2010   K 4.0 03/22/2010   CL 104 03/22/2010   CREATININE 0.84 03/22/2010   BUN 20 03/22/2010   CO2 27 03/22/2010   TSH 3.01 02/14/2010   HGBA1C  Value: 7.2 (NOTE)                                                                       According to the ADA Clinical Practice Recommendations for 2011, when HbA1c is used as a screening test:   >=6.5%   Diagnostic of Diabetes Mellitus           (if abnormal result  is confirmed)  5.7-6.4%   Increased risk of developing Diabetes Mellitus  References:Diagnosis and Classification of Diabetes Mellitus,Diabetes Care,2011,34(Suppl 1):S62-S69 and Standards of Medical Care in         Diabetes - 2011,Diabetes Care,2011,34  (Suppl 1):S11-S61.* 03/28/2010

## 2012-03-06 NOTE — Assessment & Plan Note (Signed)
Symptomatically stable, last a1c with good control on chart review oct 2013 per endo, cont same tx, f/u endo as planned

## 2012-03-06 NOTE — Assessment & Plan Note (Addendum)
Pt unwilling for now to take florinef, still with symptoms, will ask her to try midodrine 5 tid, with f/u neurology next wk as planned, form to be filled out for cont'd ST disability to cont until end of this school yr, then for LT disability after

## 2012-03-08 ENCOUNTER — Other Ambulatory Visit: Payer: Self-pay

## 2012-03-08 MED ORDER — ZOLPIDEM TARTRATE 10 MG PO TABS: 10.0000 mg | ORAL_TABLET | Freq: Every evening | ORAL | Status: AC | PRN

## 2012-03-08 NOTE — Telephone Encounter (Signed)
Done hardcopy to robin  

## 2012-03-08 NOTE — Telephone Encounter (Signed)
Pharmacy requesting refill on Zolpidem 10 mg tablet to [facility] please advise as not on current list.

## 2012-03-09 ENCOUNTER — Telehealth: Payer: Self-pay

## 2012-03-09 NOTE — Telephone Encounter (Signed)
Faxed hardcopy to pharmacy. 

## 2012-03-09 NOTE — Telephone Encounter (Signed)
Called the patient to inform Disability form has been completed and ready to pickup at the front desk.  Put copy to scan.

## 2012-03-22 ENCOUNTER — Ambulatory Visit (INDEPENDENT_AMBULATORY_CARE_PROVIDER_SITE_OTHER): Payer: BC Managed Care – PPO | Admitting: Internal Medicine

## 2012-03-22 ENCOUNTER — Encounter: Payer: Self-pay | Admitting: Internal Medicine

## 2012-03-22 ENCOUNTER — Other Ambulatory Visit: Payer: Self-pay | Admitting: *Deleted

## 2012-03-22 VITALS — BP 151/98 | HR 161 | Ht 67.0 in | Wt 172.4 lb

## 2012-03-22 DIAGNOSIS — R000 Tachycardia, unspecified: Secondary | ICD-10-CM

## 2012-03-22 DIAGNOSIS — I951 Orthostatic hypotension: Secondary | ICD-10-CM

## 2012-03-22 DIAGNOSIS — G90A Postural orthostatic tachycardia syndrome (POTS): Secondary | ICD-10-CM

## 2012-03-22 LAB — CBC WITH DIFFERENTIAL/PLATELET
Eosinophils Relative: 0.9 % (ref 0.0–5.0)
HCT: 41.6 % (ref 36.0–46.0)
Lymphs Abs: 1.6 10*3/uL (ref 0.7–4.0)
MCV: 93.5 fl (ref 78.0–100.0)
Monocytes Absolute: 0.4 10*3/uL (ref 0.1–1.0)
Neutro Abs: 4.6 10*3/uL (ref 1.4–7.7)
Platelets: 273 10*3/uL (ref 150.0–400.0)
RDW: 12.3 % (ref 11.5–14.6)
WBC: 6.7 10*3/uL (ref 4.5–10.5)

## 2012-03-22 LAB — BASIC METABOLIC PANEL
Calcium: 9.3 mg/dL (ref 8.4–10.5)
Creatinine, Ser: 0.9 mg/dL (ref 0.4–1.2)
GFR: 68.02 mL/min (ref >60.00)
Glucose, Bld: 176 mg/dL — ABNORMAL HIGH (ref 70–99)
Sodium: 142 mEq/L (ref 135–145)

## 2012-03-22 LAB — URINALYSIS, ROUTINE W REFLEX MICROSCOPIC
Bilirubin Urine: NEGATIVE
Hgb urine dipstick: NEGATIVE
Total Protein, Urine: NEGATIVE
Urine Glucose: NEGATIVE

## 2012-03-22 MED ORDER — PINDOLOL 5 MG PO TABS: 2.5000 mg | ORAL_TABLET | Freq: Two times a day (BID) | ORAL | Status: DC

## 2012-03-22 NOTE — Patient Instructions (Addendum)
LABS TODAY:  CBC, BMET, TSH, HGBA1c  Pindolol 2.5mg  twice daily.  Your physician recommends that you schedule a follow-up appointment in: 1 month with Dr. Tenny Craw.

## 2012-03-22 NOTE — Progress Notes (Addendum)
HPI Patient is a 52 year old who was referred fro evaluation of orthostatic hypotenision. She has been followed by Drs. John and Dohrmeir. She also carries dx of possible Parkinson's (drug induced) No records.  Seen in Neuro a few wks ago  Last week got confused.  Went down  Was checking insulin   Had gotten out of bed 5 to 10 min prior  Glu was 120 NO frank syncope  Just couldn't process things.  Can't control going down.  On floor for 1 min.   After that felt a little tired and light headed.   Allergies  Allergen Reactions  . Nsaids Other (See Comments)    Irritates stomach  . Sulfonamide Derivatives Rash    Current Outpatient Prescriptions  Medication Sig Dispense Refill  . atorvastatin (LIPITOR) 40 MG tablet TAKE 1 TABLET (40 MG TOTAL) BY MOUTH DAILY.  90 tablet  3  . buPROPion (WELLBUTRIN) 100 MG tablet Take 100 mg by mouth 2 (two) times daily.      . insulin glargine (LANTUS SOLOSTAR) 100 UNIT/ML injection Inject 50 Units into the skin daily. 50 units Dr. Leslie Dales      . insulin glulisine (APIDRA SOLOSTAR) 100 UNIT/ML injection Inject into the skin. Sliding Scale As needed Dr. Berenda Morale      . midodrine (PROAMATINE) 5 MG tablet Take 5 mg by mouth daily.      . risperiDONE (RISPERDAL) 2 MG tablet Take 2 mg by mouth daily.      [name] VIIBRYD 40 MG TABS Take 40 mg by mouth Daily.      [name] zolpidem (AMBIEN) 10 MG tablet Take 1 tablet (10 mg total) by mouth at bedtime as needed for sleep.  30 tablet  5   No current facility-administered medications for this visit.    Past Medical History  Diagnosis Date  . Rotator cuff syndrome of left shoulder   . S/P partial hysterectomy   . Addiction, opium   . Therapy     on therapy by psychiatrist using sebutex   . 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 IBS (irritable bowel syndrome) 02/15/2011  . Bipolar disorder 02/15/2011  . Anxiety   . Depression   . Diabetes mellitus     sees Dr Altheimer/endo  .  Thyroid nodule     s/p biopsy - benign approx jan 2012  . Insomnia 02/21/2011  . Subacute dyskinesia due to drugs(333.85)     dr dohmeier/neuro  . Resting tremor   . Cervical spondylosis without myelopathy   . Orthostatic hypotension   . Allergy     spring  . Hyperlipidemia   . Dysautonomia 10/24/2011    Past Surgical History  Procedure Laterality Date  . Ooporectomy    . Herniated disc cervical spine    . Left acromioplasty  2002    Dr Darrelyn Hillock  . Right breast lumpectomy    . Spine surgery  feb 2012    c5-6 fusion, Dr Shon Baton    Family History  Problem Relation Age of Onset  . Cancer Mother   . Heart disease Father   . Hypertension Father     History   Social History  . Marital Status: Single    Spouse Name: N/A    Number of Children: N/A  . Years of Education: N/A   Occupational History  . Not on file.   Social History Main Topics  . Smoking status: Never Smoker   . Smokeless  tobacco: Never Used  . Alcohol Use: No  . Drug Use: No  . Sexually Active: No   Other Topics Concern  . Not on file   Social History Narrative  . No narrative on file    Review of Systems:  All systems reviewed.  They are negative to the above problem except as previously stated.  Vital Signs: BP 151/98  Pulse 161  Ht 5\' 7"  (1.702 m)  Wt 172 lb 6.4 oz (78.2 kg)  BMI 27 kg/m2  SpO2 98%  Physical Exam Patient gets up slowly from sitting  Pulse on my check was 130s  BP 160/90 HEENT:  Normocephalic, atraumatic. EOMI, PERRLA.  Neck: JVP is normal.  No bruits.  Lungs: clear to auscultation. No rales no wheezes.  Heart: Regular rate and rhythm. Normal S1, S2. No S3.   No significant murmurs. PMI not displaced.  Abdomen:  Supple, nontender. Normal bowel sounds. No masses. No hepatomegaly.  Extremities:   Good distal pulses throughout. No lower extremity edema.  Musculoskeletal :moving all extremities.  Neuro:   alert and oriented x3.  CN II-XII grossly intact.   Assessment and  Plan:  1.  Autonomic dysfunction  HR is still fast  BP is high  It looks after reviewing pharmacy Rx that she is on mestinon bid and midodrine 5 1x per day I would add pindolol 2.5 bid.  Check CBC, BMET, TSH, Hgb A1C UA today.  Will consider echo. I will also contact Dr Dohrmeir re medical regimen. Drink fluids.

## 2012-03-23 ENCOUNTER — Ambulatory Visit: Payer: BC Managed Care – PPO | Admitting: Internal Medicine

## 2012-03-27 ENCOUNTER — Other Ambulatory Visit: Payer: Self-pay

## 2012-03-31 ENCOUNTER — Ambulatory Visit: Payer: BC Managed Care – PPO | Admitting: Internal Medicine

## 2012-04-09 ENCOUNTER — Ambulatory Visit (INDEPENDENT_AMBULATORY_CARE_PROVIDER_SITE_OTHER): Payer: BC Managed Care – PPO | Admitting: Internal Medicine

## 2012-04-09 ENCOUNTER — Encounter: Payer: Self-pay | Admitting: Internal Medicine

## 2012-04-09 ENCOUNTER — Other Ambulatory Visit: Payer: Self-pay | Admitting: Internal Medicine

## 2012-04-09 ENCOUNTER — Ambulatory Visit (INDEPENDENT_AMBULATORY_CARE_PROVIDER_SITE_OTHER)
Admission: RE | Admit: 2012-04-09 | Discharge: 2012-04-09 | Disposition: A | Discharge status: Home / Self Care | Payer: BC Managed Care – PPO | Source: Ambulatory Visit | Attending: Internal Medicine | Admitting: Internal Medicine

## 2012-04-09 VITALS — BP 120/82 | HR 97 | Temp 98.3°F | Ht 67.0 in | Wt 168.4 lb

## 2012-04-09 DIAGNOSIS — M533 Sacrococcygeal disorders, not elsewhere classified: Secondary | ICD-10-CM

## 2012-04-09 DIAGNOSIS — M25521 Pain in right elbow: Secondary | ICD-10-CM

## 2012-04-09 DIAGNOSIS — M25529 Pain in unspecified elbow: Secondary | ICD-10-CM

## 2012-04-09 DIAGNOSIS — E109 Type 1 diabetes mellitus without complications: Secondary | ICD-10-CM

## 2012-04-09 DIAGNOSIS — S322XXA Fracture of coccyx, initial encounter for closed fracture: Secondary | ICD-10-CM

## 2012-04-09 MED ORDER — PREDNISONE 10 MG PO TABS: ORAL_TABLET | ORAL | Status: DC

## 2012-04-09 MED ORDER — TRAMADOL HCL 50 MG PO TABS: 50.0000 mg | ORAL_TABLET | Freq: Four times a day (QID) | ORAL | Status: DC | PRN

## 2012-04-09 NOTE — Assessment & Plan Note (Signed)
Marked tender, for pain control, for film to r/o fx, consider ortho referral, cont donut use

## 2012-04-09 NOTE — Assessment & Plan Note (Signed)
stable overall by history and exam, recent data reviewed with pt, and pt to continue medical treatment as before,  to f/u any worsening symptoms or concerns Lab Results  Component Value Date   HGBA1C 8.3* 03/22/2012

## 2012-04-09 NOTE — Patient Instructions (Addendum)
Please take all new medication as prescribed - the pain medication and prednisone Please continue all other medications as before, and refills have been done if requested. Please watch as your sugars may be slightly elevated on the prednisone Please go to the XRAY Department in the Basement (go straight as you get off the elevator) for the x-ray testing You will be contacted by phone if any changes need to be made immediately.  Otherwise, you will receive a letter about your results with an explanation, but please check with MyChart first. Thank you for enrolling in MyChart. Please follow the instructions below to securely access your online medical record. MyChart allows you to send messages to your doctor, view your test results, renew your prescriptions, schedule appointments, and more. To Log into My Chart online, please go by [employer] or [employer] to [email or web address], or download the MyChart App from the [employer] of [employer] .  Your Username is: [email or web address] ([email or web address]) Please call if the elbow or tailbone pain persists as you may need to see orthopedic

## 2012-04-09 NOTE — Assessment & Plan Note (Signed)
Less likely fracture, c/w lateral epicondylitis, cant r/o overuse but she denies, for film today

## 2012-04-09 NOTE — Progress Notes (Signed)
Subjective:    Patient ID: Lynn Barnes, female    DOB: 03/08/1960, 52 y.o.   MRN: [id number]  HPI  Here with c/o 2 wks ongoing tailbone pain after 2 recent falls, the first occurred 2 wks ago with a fainting spell, falling to the floor in the kitchen and subsequent pain, and then unfortunately occurred again yesterday with fall again to the tailbone but also to the right elbow as well; today with large amount of swelling to the lateral epicondylar area of the elbow and such pain to the tailbone she cannot sit on hard chair, only on a donut at home.  Pt denies chest pain, increased sob or doe, wheezing, orthopnea, PND, increased LE swelling, palpitations, dizziness or syncope.  Pt denies new neurological symptoms such as new headache, or facial or extremity weakness or numbness   Pt denies polydipsia, polyuria.  Pt without bowel or bladder change, fever, wt loss,  worsening LE pain/numbness/weakness, or other trauma  Past Medical History  Diagnosis Date  . Rotator cuff syndrome of left shoulder   . S/P partial hysterectomy   . Addiction, opium   . Therapy     on therapy by psychiatrist using sebutex   . 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 IBS (irritable bowel syndrome) 02/15/2011  . Bipolar disorder 02/15/2011  . Anxiety   . Depression   . Diabetes mellitus     sees Dr Altheimer/endo  . Thyroid nodule     s/p biopsy - benign approx jan 2012  . Insomnia 02/21/2011  . Subacute dyskinesia due to drugs(333.85)     dr dohmeier/neuro  . Resting tremor   . Cervical spondylosis without myelopathy   . Orthostatic hypotension   . Allergy     spring  . Hyperlipidemia   . Dysautonomia 10/24/2011   Past Surgical History  Procedure Laterality Date  . Ooporectomy    . Herniated disc cervical spine    . Left acromioplasty  2002    Dr Darrelyn Hillock  . Right breast lumpectomy    . Spine surgery  feb 2012    c5-6 fusion, Dr Shon Baton    reports that she has never  smoked. She has never used smokeless tobacco. She reports that she does not drink alcohol or use illicit drugs. family history includes Cancer in her mother; Heart disease in her father; and Hypertension in her father. Allergies  Allergen Reactions  . Nsaids Other (See Comments)    Irritates stomach  . Sulfonamide Derivatives Rash   Current Outpatient Prescriptions on File Prior to Visit  Medication Sig Dispense Refill  . atorvastatin (LIPITOR) 40 MG tablet TAKE 1 TABLET (40 MG TOTAL) BY MOUTH DAILY.  90 tablet  3  . buPROPion (WELLBUTRIN) 100 MG tablet Take 100 mg by mouth 2 (two) times daily.      . insulin glargine (LANTUS SOLOSTAR) 100 UNIT/ML injection Inject 50 Units into the skin daily. 50 units Dr. Leslie Dales      . insulin glulisine (APIDRA SOLOSTAR) 100 UNIT/ML injection Inject into the skin. Sliding Scale As needed Dr. Berenda Morale      . midodrine (PROAMATINE) 5 MG tablet Take 5 mg by mouth daily.      . pindolol (VISKEN) 5 MG tablet Take 0.5 tablets (2.5 mg total) by mouth 2 (two) times daily.  90 tablet  3  . pyridostigmine (MESTINON) 60 MG tablet Take 30 mg by mouth 2 (two) times daily.      [name]  risperiDONE (RISPERDAL) 2 MG tablet Take 2 mg by mouth daily.      [name] VIIBRYD 40 MG TABS Take 40 mg by mouth Daily.       No current facility-administered medications on file prior to visit.   Review of Systems  Constitutional: Negative for unexpected weight change, or unusual diaphoresis  HENT: Negative for tinnitus.   Eyes: Negative for photophobia and visual disturbance.  Respiratory: Negative for choking and stridor.   Gastrointestinal: Negative for vomiting and blood in stool.  Genitourinary: Negative for hematuria and decreased urine volume.  Musculoskeletal: Negative for acute joint swelling Skin: Negative for color change and wound.  Neurological: Negative for tremors and numbness other than noted  Psychiatric/Behavioral: Negative for decreased concentration or  hyperactivity.        Objective:   Physical Exam BP 120/82  Pulse 97  Temp(Src) 98.3 F (36.8 C) (Oral)  Ht 5\' 7"  (1.702 m)  Wt 168 lb 6 oz (76.374 kg)  BMI 26.36 kg/m2  SpO2 98% VS noted, alert, stands as cannot sit on chair in exam room Constitutional: Pt appears well-developed and well-nourished.  HENT: Head: NCAT.  Right Ear: External ear normal.  Left Ear: External ear normal.  Eyes: Conjunctivae and EOM are normal. Pupils are equal, round, and reactive to light.  Neck: Normal range of motion. Neck supple.  Cardiovascular: Normal rate and regular rhythm.   Pulmonary/Chest: Effort normal and breath sounds normal.  Abd:  Soft, NT, non-distended, + BS Neurological: Pt is alert. Not confused, motor/dtr intact to LE's Spine nontender, but has marked coccygial tender Right lateral epicondylar area with 1-2+ diffuse swelling/tender without skin change/redness/fluctuance ; elbow o/w FROM, NT, no effusion or bursa swelling Skin: Skin is warm. No erythema.  Psychiatric: Pt behavior is normal. Thought content normal.     Assessment & Plan:

## 2012-04-22 ENCOUNTER — Encounter: Payer: Self-pay | Admitting: Internal Medicine

## 2012-04-22 ENCOUNTER — Ambulatory Visit (INDEPENDENT_AMBULATORY_CARE_PROVIDER_SITE_OTHER): Payer: BC Managed Care – PPO | Admitting: Internal Medicine

## 2012-04-22 VITALS — BP 96/74 | HR 92 | Ht 67.0 in | Wt 168.4 lb

## 2012-04-22 DIAGNOSIS — R0602 Shortness of breath: Secondary | ICD-10-CM

## 2012-04-22 NOTE — Patient Instructions (Addendum)
Your physician has requested that you have an echocardiogram. Echocardiography is a painless test that uses sound waves to create images of your heart. It provides your doctor with information about the size and shape of your heart and how well your heart's chambers and valves are working. This procedure takes approximately one hour. There are no restrictions for this procedure.   

## 2012-04-22 NOTE — Progress Notes (Signed)
HPI Patient is a 52 year old who was referred fro evaluation of orthostatic hypotenision. She has been followed by Drs. John and Dohrmeir. She also carries dx of possible Parkinson's (drug induced)  When I saw her she had resting tachycardia  I recomm adding pindolol to her regimen. Since I saw her she has had 2 spells of dropping  No syncope  No prodrome.  Weak after  Needs help getting up. Still gets SOB with activity.   Allergies  Allergen Reactions  . Nsaids Other (See Comments)    Irritates stomach  . Sulfonamide Derivatives Rash    Current Outpatient Prescriptions  Medication Sig Dispense Refill  . atorvastatin (LIPITOR) 40 MG tablet TAKE 1 TABLET (40 MG TOTAL) BY MOUTH DAILY.  90 tablet  3  . buPROPion (WELLBUTRIN) 100 MG tablet Take 100 mg by mouth 2 (two) times daily.      . insulin glargine (LANTUS SOLOSTAR) 100 UNIT/ML injection Inject 50 Units into the skin daily. 50 units Dr. Leslie Dales      . insulin glulisine (APIDRA SOLOSTAR) 100 UNIT/ML injection Inject into the skin. Sliding Scale As needed Dr. Berenda Morale      . midodrine (PROAMATINE) 5 MG tablet Take 5 mg by mouth daily.      . pindolol (VISKEN) 5 MG tablet Take 0.5 tablets (2.5 mg total) by mouth 2 (two) times daily.  90 tablet  3  . pyridostigmine (MESTINON) 60 MG tablet Take 30 mg by mouth 2 (two) times daily.      . risperiDONE (RISPERDAL) 2 MG tablet Take 2 mg by mouth daily.      [name] VIIBRYD 40 MG TABS Take 40 mg by mouth Daily.       No current facility-administered medications for this visit.    Past Medical History  Diagnosis Date  . Rotator cuff syndrome of left shoulder   . S/P partial hysterectomy   . Addiction, opium   . Therapy     on therapy by psychiatrist using sebutex   . 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 IBS (irritable bowel syndrome) 02/15/2011  . Bipolar disorder 02/15/2011  . Anxiety   . Depression   . Diabetes mellitus     sees Dr Altheimer/endo  .  Thyroid nodule     s/p biopsy - benign approx jan 2012  . Insomnia 02/21/2011  . Subacute dyskinesia due to drugs(333.85)     dr dohmeier/neuro  . Resting tremor   . Cervical spondylosis without myelopathy   . Orthostatic hypotension   . Allergy     spring  . Hyperlipidemia   . Dysautonomia 10/24/2011    Past Surgical History  Procedure Laterality Date  . Ooporectomy    . Herniated disc cervical spine    . Left acromioplasty  2002    Dr Darrelyn Hillock  . Right breast lumpectomy    . Spine surgery  feb 2012    c5-6 fusion, Dr Shon Baton    Family History  Problem Relation Age of Onset  . Cancer Mother   . Heart disease Father   . Hypertension Father     History   Social History  . Marital Status: Single    Spouse Name: N/A    Number of Children: N/A  . Years of Education: N/A   Occupational History  . Not on file.   Social History Main Topics  . Smoking status: Never Smoker   . Smokeless tobacco: Never Used  .  Alcohol Use: No  . Drug Use: No  . Sexually Active: No   Other Topics Concern  . Not on file   Social History Narrative  . No narrative on file    Review of Systems:  All systems reviewed.  They are negative to the above problem except as previously stated.  Vital Signs: BP 96/74.  P 92  Wt 168 Physical Exam BP HEENT:  Normocephalic, atraumatic. EOMI, PERRLA.  Neck: JVP is normal.  No bruits.  Lungs: clear to auscultation. No rales no wheezes.  Heart: Regular rate and rhythm. Normal S1, S2. No S3.   No significant murmurs. PMI not displaced.  Abdomen:  Supple, nontender. Normal bowel sounds. No masses. No hepatomegaly.  Extremities:   Good distal pulses throughout. No lower extremity edema.  Musculoskeletal :moving all extremities.  Neuro:   alert and oriented x3.  CN II-XII grossly intact.  EKG  SR 88 bpm.   Assessment and Plan:  1.  Dysautonomia.  Patient's taychardia has improved.   She still gets DOE  I would keep on same regimen  Will need to  review with neuro.   I do not understant drop spells. Would get echo to evaluate LV systolic/diastolic function.    Will contact neurology.   Keep on same regimen for now.

## 2012-04-27 ENCOUNTER — Other Ambulatory Visit (HOSPITAL_COMMUNITY): Payer: BC Managed Care – PPO

## 2012-05-05 ENCOUNTER — Ambulatory Visit (HOSPITAL_COMMUNITY): Payer: BC Managed Care – PPO | Attending: Cardiology | Admitting: Radiology

## 2012-05-05 DIAGNOSIS — E119 Type 2 diabetes mellitus without complications: Secondary | ICD-10-CM | POA: Insufficient documentation

## 2012-05-05 DIAGNOSIS — R000 Tachycardia, unspecified: Secondary | ICD-10-CM | POA: Insufficient documentation

## 2012-05-05 DIAGNOSIS — R0602 Shortness of breath: Secondary | ICD-10-CM | POA: Insufficient documentation

## 2012-05-05 DIAGNOSIS — Z8249 Family history of ischemic heart disease and other diseases of the circulatory system: Secondary | ICD-10-CM | POA: Insufficient documentation

## 2012-05-05 NOTE — Progress Notes (Signed)
Echocardiogram performed.  

## 2012-05-07 ENCOUNTER — Telehealth: Payer: Self-pay | Admitting: Internal Medicine

## 2012-05-07 NOTE — Telephone Encounter (Signed)
Patient declined triage-is calling regarding a scratchy throat.  Denies other symptoms.  Is requesting a prescription of Magic Mouth Wash.  RN can not find medication in medication list.  Last visit with Dr. Jonny Ruiz was 02/19/2011.  Drug store of choice is [facility] on [address].  RN advised patient of sick clinic on 05/08/12 at [facility] if needed.  Caller demonstrated her understanding.

## 2012-05-08 MED ORDER — NYSTATIN 100000 UNIT/ML MT SUSP
500000.0000 [IU] | Freq: Four times a day (QID) | OROMUCOSAL | Status: DC
Start: 2012-05-08 — End: 2012-09-09

## 2012-05-08 NOTE — Telephone Encounter (Signed)
Actually last OV was feb 2014  OK for nystatin as per reqeust - done erx

## 2012-05-10 NOTE — Telephone Encounter (Signed)
Patient informed. 

## 2012-05-12 NOTE — Progress Notes (Signed)
Pt.notified

## 2012-05-29 ENCOUNTER — Other Ambulatory Visit: Payer: Self-pay | Admitting: Neurology

## 2012-05-31 ENCOUNTER — Other Ambulatory Visit: Payer: Self-pay | Admitting: Neurology

## 2012-06-15 ENCOUNTER — Encounter: Payer: Self-pay | Admitting: Internal Medicine

## 2012-06-15 ENCOUNTER — Ambulatory Visit (INDEPENDENT_AMBULATORY_CARE_PROVIDER_SITE_OTHER): Payer: BC Managed Care – PPO | Admitting: Internal Medicine

## 2012-06-15 VITALS — BP 92/62 | HR 81 | Temp 97.7°F | Ht 67.0 in | Wt 164.1 lb

## 2012-06-15 DIAGNOSIS — M25521 Pain in right elbow: Secondary | ICD-10-CM

## 2012-06-15 DIAGNOSIS — M7701 Medial epicondylitis, right elbow: Secondary | ICD-10-CM | POA: Insufficient documentation

## 2012-06-15 DIAGNOSIS — G90A Postural orthostatic tachycardia syndrome (POTS): Secondary | ICD-10-CM

## 2012-06-15 DIAGNOSIS — M25529 Pain in unspecified elbow: Secondary | ICD-10-CM

## 2012-06-15 DIAGNOSIS — I951 Orthostatic hypotension: Secondary | ICD-10-CM

## 2012-06-15 DIAGNOSIS — M7710 Lateral epicondylitis, unspecified elbow: Secondary | ICD-10-CM | POA: Insufficient documentation

## 2012-06-15 DIAGNOSIS — R000 Tachycardia, unspecified: Secondary | ICD-10-CM

## 2012-06-15 DIAGNOSIS — F411 Generalized anxiety disorder: Secondary | ICD-10-CM

## 2012-06-15 MED ORDER — IBUPROFEN 600 MG PO TABS: 600.0000 mg | ORAL_TABLET | Freq: Four times a day (QID) | ORAL | Status: DC | PRN

## 2012-06-15 NOTE — Assessment & Plan Note (Signed)
C/w tendonitis, for ibuprofen prn, arm band use,  to f/u any worsening symptoms or concerns

## 2012-06-15 NOTE — Progress Notes (Signed)
Subjective:    Patient ID: [name], female    DOB: April 29, 1960, 52 y.o.   MRN: [id number]  HPI here to f/u, now in the process of applying for LTD, needs form filled out.  Pt denies chest pain, increased sob or doe, wheezing, orthopnea, PND, increased LE swelling, palpitations, but has had 5 episodes since last fall with broken bones of syncope typical for her with hx of POTS.  Also menitons right elbow pain, both medial and lateral aspect after lifting and carrying heavy shopping bags last wk.  Pt denies new neurological symptoms such as new headache, or facial or extremity weakness or numbness   Pt denies polydipsia, polyuria. Denies worsening depressive symptoms, suicidal ideation, or panic; has ongoing anxiety, not increased recently.  Past Medical History  Diagnosis Date  . Rotator cuff syndrome of left shoulder   . S/P partial hysterectomy   . Addiction, opium   . Therapy     on therapy by psychiatrist using sebutex   . 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 IBS (irritable bowel syndrome) 02/15/2011  . Bipolar disorder 02/15/2011  . Anxiety   . Depression   . Diabetes mellitus     sees Dr Altheimer/endo  . Thyroid nodule     s/p biopsy - benign approx jan 2012  . Insomnia 02/21/2011  . Subacute dyskinesia due to drugs(333.85)     dr dohmeier/neuro  . Resting tremor   . Cervical spondylosis without myelopathy   . Orthostatic hypotension   . Allergy     spring  . Hyperlipidemia   . Dysautonomia 10/24/2011   Past Surgical History  Procedure Laterality Date  . Ooporectomy    . Herniated disc cervical spine    . Left acromioplasty  2002    Dr Darrelyn Hillock  . Right breast lumpectomy    . Spine surgery  feb 2012    c5-6 fusion, Dr Shon Baton    reports that she has never smoked. She has never used smokeless tobacco. She reports that she does not drink alcohol or use illicit drugs. family history includes Cancer in her mother; Heart disease in her  father; and Hypertension in her father. Allergies  Allergen Reactions  . Nsaids Other (See Comments)    Irritates stomach  . Sulfonamide Derivatives Rash   Current Outpatient Prescriptions on File Prior to Visit  Medication Sig Dispense Refill  . atorvastatin (LIPITOR) 40 MG tablet TAKE 1 TABLET (40 MG TOTAL) BY MOUTH DAILY.  90 tablet  3  . buPROPion (WELLBUTRIN) 100 MG tablet Take 100 mg by mouth 2 (two) times daily.      . insulin glargine (LANTUS SOLOSTAR) 100 UNIT/ML injection Inject 50 Units into the skin daily. 50 units Dr. Leslie Dales      . insulin glulisine (APIDRA SOLOSTAR) 100 UNIT/ML injection Inject into the skin. Sliding Scale As needed Dr. Berenda Morale      . midodrine (PROAMATINE) 5 MG tablet Take 5 mg by mouth daily.      [name] nystatin (MYCOSTATIN) 100000 UNIT/ML suspension Take 5 mLs (500,000 Units total) by mouth 4 (four) times daily.  60 mL  0  . pindolol (VISKEN) 5 MG tablet Take 0.5 tablets (2.5 mg total) by mouth 2 (two) times daily.  90 tablet  3  . pyridostigmine (MESTINON) 60 MG tablet Take 30 mg by mouth 2 (two) times daily.      . risperiDONE (RISPERDAL) 2 MG tablet Take 2 mg by  mouth daily.      [name] VIIBRYD 40 MG TABS TAKE 1 TABLET EVERY DAY  90 tablet  2  . zolpidem (AMBIEN) 10 MG tablet Take 10 mg by mouth at bedtime as needed for sleep.       No current facility-administered medications on file prior to visit.   Review of Systems  Constitutional: Negative for unexpected weight change, or unusual diaphoresis  HENT: Negative for tinnitus.   Eyes: Negative for photophobia and visual disturbance.  Respiratory: Negative for choking and stridor.   Gastrointestinal: Negative for vomiting and blood in stool.  Genitourinary: Negative for hematuria and decreased urine volume.  Musculoskeletal: Negative for acute joint swelling Skin: Negative for color change and wound.  Neurological: Negative for tremors and numbness other than noted .      Objective:   Physical  Exam BP 92/62  Pulse 81  Temp(Src) 97.7 F (36.5 C) (Oral)  Ht 5\' 7"  (1.702 m)  Wt 164 lb 2 oz (74.447 kg)  BMI 25.7 kg/m2  SpO2 94% VS noted,  Constitutional: Pt appears well-developed and well-nourished.  HENT: Head: NCAT.  Right Ear: External ear normal.  Left Ear: External ear normal.  Eyes: Conjunctivae and EOM are normal. Pupils are equal, round, and reactive to light.  Neck: Normal range of motion. Neck supple.  Cardiovascular: Normal rate and regular rhythm.   Pulmonary/Chest: Effort normal and breath sounds normal.  Abd:  Soft, NT, non-distended, + BS Neurological: Pt is alert. Not confused  Tender mild noted only to lateral and medial epicondylar areas, no swelling or erythema Skin: Skin is warm. No erythema.  Mild nervous    Assessment & Plan:

## 2012-06-15 NOTE — Assessment & Plan Note (Signed)
Will need form filled out for LTD, o/w cont same tx

## 2012-06-15 NOTE — Assessment & Plan Note (Signed)
stable overall by history and exam, recent data reviewed with pt, and pt to continue medical treatment as before,  to f/u any worsening symptoms or concerns Lab Results  Component Value Date   WBC 6.7 03/22/2012   HGB 14.3 03/22/2012   HCT 41.6 03/22/2012   PLT 273.0 03/22/2012   GLUCOSE 176* 03/22/2012   NA 142 03/22/2012   K 3.5 03/22/2012   CL 104 03/22/2012   CREATININE 0.9 03/22/2012   BUN 16 03/22/2012   CO2 27 03/22/2012   TSH 1.31 03/22/2012   HGBA1C 8.3* 03/22/2012

## 2012-06-15 NOTE — Patient Instructions (Signed)
Please take all new medication as prescribed Please continue all other medications as before, and refills have been done if requested. Please have the pharmacy call with any other refills you may need. Please keep your appointments with your specialists as you have planned - endo and cardiology Your form should be finished this week Please wear the "arm band" OTC from most drug stores to help with the tendonitis to the elbow

## 2012-07-18 ENCOUNTER — Other Ambulatory Visit: Payer: Self-pay | Admitting: Internal Medicine

## 2012-07-21 ENCOUNTER — Telehealth: Payer: Self-pay | Admitting: Internal Medicine

## 2012-07-21 MED ORDER — SCOPOLAMINE 1 MG/3DAYS TD PT72: 1.0000 | MEDICATED_PATCH | TRANSDERMAL | Status: DC

## 2012-07-21 NOTE — Telephone Encounter (Signed)
rx done erx 

## 2012-07-21 NOTE — Telephone Encounter (Signed)
Called left detailed message rx sent in.

## 2012-07-21 NOTE — Telephone Encounter (Signed)
Pt called req motion sickness patch, pt stated [place]. Please call pt.

## 2012-08-26 ENCOUNTER — Encounter: Payer: Self-pay | Admitting: Internal Medicine

## 2012-08-26 ENCOUNTER — Telehealth: Payer: Self-pay | Admitting: Internal Medicine

## 2012-08-26 ENCOUNTER — Ambulatory Visit (INDEPENDENT_AMBULATORY_CARE_PROVIDER_SITE_OTHER): Payer: BC Managed Care – PPO | Admitting: Internal Medicine

## 2012-08-26 VITALS — BP 112/70 | HR 84 | Temp 97.4°F | Ht 67.0 in | Wt 173.4 lb

## 2012-08-26 DIAGNOSIS — F411 Generalized anxiety disorder: Secondary | ICD-10-CM

## 2012-08-26 NOTE — Progress Notes (Signed)
  Subjective:    Patient ID: Lynn Barnes, female    DOB: [birth year], 52 y.o.   MRN: [id number]  HPI pt is under impression she needed OV to ask for     Review of Systems     Objective:   Physical Exam        Assessment & Plan:

## 2012-08-26 NOTE — Telephone Encounter (Signed)
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

## 2012-09-06 ENCOUNTER — Ambulatory Visit: Payer: BC Managed Care – PPO | Admitting: Internal Medicine

## 2012-09-08 ENCOUNTER — Other Ambulatory Visit: Payer: Self-pay | Admitting: Internal Medicine

## 2012-09-08 NOTE — Telephone Encounter (Signed)
Done hardcopy to robin  

## 2012-09-09 ENCOUNTER — Encounter: Payer: Self-pay | Admitting: Neurology

## 2012-09-09 ENCOUNTER — Ambulatory Visit (INDEPENDENT_AMBULATORY_CARE_PROVIDER_SITE_OTHER): Payer: BC Managed Care – PPO | Admitting: Neurology

## 2012-09-09 VITALS — BP 115/78 | HR 90 | Resp 16 | Ht 68.0 in | Wt 172.0 lb

## 2012-09-09 DIAGNOSIS — R000 Tachycardia, unspecified: Secondary | ICD-10-CM

## 2012-09-09 DIAGNOSIS — E1143 Type 2 diabetes mellitus with diabetic autonomic (poly)neuropathy: Secondary | ICD-10-CM

## 2012-09-09 DIAGNOSIS — G219 Secondary parkinsonism, unspecified: Secondary | ICD-10-CM

## 2012-09-09 DIAGNOSIS — I951 Orthostatic hypotension: Secondary | ICD-10-CM

## 2012-09-09 DIAGNOSIS — G909 Disorder of the autonomic nervous system, unspecified: Secondary | ICD-10-CM

## 2012-09-09 DIAGNOSIS — G2111 Neuroleptic induced parkinsonism: Secondary | ICD-10-CM

## 2012-09-09 DIAGNOSIS — G90A Postural orthostatic tachycardia syndrome (POTS): Secondary | ICD-10-CM

## 2012-09-09 DIAGNOSIS — E1149 Type 2 diabetes mellitus with other diabetic neurological complication: Secondary | ICD-10-CM

## 2012-09-09 MED ORDER — FLUDROCORTISONE ACETATE 0.1 MG PO TABS: 0.1000 mg | ORAL_TABLET | Freq: Two times a day (BID) | ORAL | Status: DC

## 2012-09-09 NOTE — Progress Notes (Signed)
[facility]  Provider:  Melvyn Novas, M D  Referring Provider: Corwin Levins, MD Primary Care Physician:  Oliver Barre, MD    HPI:  Lynn Barnes is a 52 y.o. female  Is seen here as a referral/ revisit  from Dr. Leslie Dales.   Lynn Barnes has been a diabetic since childhood and and developed tremors, twitches and neck rigidity in 2012 when she was seen by her then primary care physician Dr. Dianna Limbo.  She also noted hand weakness  And dropped objects,  she felt that there was a worsening after she underwent a discectomy in her lower back - swallowing difficulties became apparent  that were not there before- and that her handwriting has changed.  I hade noticed that she was more rigid and somewhat tremulous , concerned about secondary Parkinsonism in a patient with a long psychiatric history . I have tried carbidopa levodopa to see if the tremor complement would respond, but the patient became fainty,  Hypotensive and very,  very depressed and tearful.  She read she had to return to Risperidol , a  medication she had taken in the past and that also was considered perhaps essential and the development of the tremor in the first place.  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   By June the patient felt much improved on antidepressants Vibryid  was tried. She was still having insomnia,  even on Ambien and Klonopin.  In September 2013 she  Again stopped her Risperdal and became again very agitated, majorly depressed, had trouble with impulse control and was very tearful , suicidal- She was immediatly seen by  psychiatry and restarted her medications; by  December 2013 the patient used  Resperidol and Wellbutrin but now she reported shortness of breath and had palpitations again she felt fainty.  The patient is likely to have a  Diabetic induced neuropathy with dysautonomia, she responded well  to mestinon  - had diarrhea as a side effect.  There was also of the report of postural orthostatic tachycardia syndrome which fits  the dysautonomia.  in January 2014  the patient was on Mestinon 60 mg per day and did much better, Dr. Jonny Ruiz started her on this at the same time on Midrin and on oral Florinef , but she never started the Fluorinef .  [name] -the patient reports that she feels that her movements are slowed,  but she is somewhat rigid,  she is not able to perform rapid past motor tasks with the same exactment  Is not as responsive.  She notices at the beach during the summer vacation  That it  Is harder for her to be on an uneven surface and walk.    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       Exercises that she she could and should do isometrics, glaucoma, pelite is and she probably benefits from using a stationary bike or a rolling machine is easier to exercise on an even floor indoors than on grass or on the sound of the beach.  Review of Systems: Out of a complete 14 system review, the patient complains of only the following symptoms, and all other reviewed systems are negative.  Lynn Barnes has sensory neuropathy related difficulties to  walk steadily on an uneven surface. The dysautonomic neuropathy component causes her to have POTS, blood pressure and heart rate is regulations and pre-syncope. Her tremors and rigidity as well as her masked face were attributes to neuroleptic medication induced Parkinsonism. The tremors did not respond to dopamine or Artane. She developed depression with tearfulness and even suicidal ideation on dopamin medications. The patient reports that her depression is currently well treated and she feels not acutely depressed.  History   Social History  . Marital Status: Single    Spouse Name: N/A     Number of Children: 0  . Years of Education: BA   Occupational History  . TEACHER [employer]   Social History Main Topics  . Smoking status: Never Smoker   . Smokeless tobacco: Never Used  . Alcohol Use: No  . Drug Use: No  . Sexually Active: No   Other Topics Concern  . Not on file   Social History Narrative  . No narrative on file    Family History  Problem Relation Age of Onset  . Cancer Mother   . Heart disease Father   . Hypertension Father   . Heart disease Paternal Grandmother   . ALS Paternal Grandmother   . Hypertension Sister     Past Medical History  Diagnosis Date  . Rotator cuff syndrome of left shoulder   . S/P partial hysterectomy   . Addiction, opium   . Therapy     on therapy by psychiatrist using sebutex   . 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 IBS (irritable bowel syndrome) 02/15/2011  . Bipolar disorder 02/15/2011  . Anxiety     ADHD CHRONIC ANXIETY  . Depression   . Diabetes mellitus     sees Dr Altheimer/endo  . Thyroid nodule     s/p biopsy - benign approx jan 2012  . Insomnia 02/21/2011  . Subacute dyskinesia due to drugs(333.85)     dr Hendricks Schwandt/neuro  . Resting tremor   . Cervical spondylosis without myelopathy   . Orthostatic hypotension   . Allergy     spring  . Hyperlipidemia   . Dysautonomia 10/24/2011  . Tremor   . Parkinsonism   . 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     opum  . Hypotension     orthostatic  . Dyskinesia      subacute,due to drugs  . Spondylosis without myelopathy   . POTS (postural orthostatic tachycardia syndrome)   . Tachycardia     syndrome  . Dyslipidemia   . Neuropathy     with paresthesias in feet  . Radiculopathy     cervical  . Multinodular thyroid     with large dominant solid nodule with calcifications in the lower left pole benign by FNA in 1/12 and stable by repeat ulltrasound in 4/13  . Depression   . 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 Ooporectomy    . Herniated disc cervical spine    . Left acromioplasty  2002    Dr Darrelyn Hillock  . Right breast lumpectomy    . Spine surgery  feb 2012    c5-6 fusion, Dr Shon Baton  . Lower back  1992  . Thyroid lobectomy  11/11    nodule biopsy, benign  . Abdominal hysterectomy      partial    Current Outpatient Prescriptions  Medication Sig Dispense Refill  .  atorvastatin (LIPITOR) 40 MG tablet TAKE 1 TABLET (40 MG TOTAL) BY MOUTH DAILY.  90 tablet  3  . buPROPion (WELLBUTRIN) 100 MG tablet Take 100 mg by mouth 2 (two) times daily.      . clonazePAM (KLONOPIN) 1 MG tablet Take 1 mg by mouth 2 (two) times daily as needed for anxiety.      [name] ibuprofen (ADVIL,MOTRIN) 600 MG tablet Take 1 tablet (600 mg total) by mouth every 6 (six) hours as needed for pain.  60 tablet  3  . insulin glargine (LANTUS SOLOSTAR) 100 UNIT/ML injection Inject 50 Units into the skin daily. 50 units Dr. Leslie Dales      . insulin glulisine (APIDRA SOLOSTAR) 100 UNIT/ML injection Inject into the skin. Sliding Scale As needed Dr. Berenda Morale      . lisdexamfetamine (VYVANSE) 40 MG capsule Take 40 mg by mouth daily.      [name] NOVOLOG FLEXPEN 100 UNIT/ML SOPN FlexPen       . ONETOUCH VERIO test strip       . Pharmacist Choice Lancets MISC       . pindolol (VISKEN) 5 MG tablet Take 0.5 tablets (2.5 mg total) by mouth 2 (two) times daily.  90 tablet  3  . risperiDONE (RISPERDAL) 2 MG tablet Take 2 mg by mouth daily.      [name] VIIBRYD 40 MG TABS TAKE 1 TABLET EVERY DAY  90 tablet  2  . zaleplon (SONATA) 10 MG capsule       . zolpidem (AMBIEN) 10 MG tablet TAKE 1 TABLET AT BEDTIME  30 tablet  5   No current facility-administered medications for this visit.    Allergies as of 09/09/2012 - Review Complete 09/09/2012  Allergen Reaction Noted  . Sinemet (carbidopa-levodopa)  09/09/2012  . Nsaids Other (See Comments) 03/04/2007  . Sulfonamide derivatives Rash 03/04/2007     Vitals: BP 115/78  Pulse 90  Resp 16  Ht 5\' 8"  (1.727 m)  Wt 172 lb (78.019 kg)  BMI 26.16 kg/m2 Last Weight:  Wt Readings from Last 1 Encounters:  09/09/12 172 lb (78.019 kg)   Last Height:   Ht Readings from Last 1 Encounters:  09/09/12 5\' 8"  (1.727 m)   BMI  .  Physical exam:  General: The patient is awake, alert and appears not in acute distress. The patient is well groomed.            [facility]  Provider:  Melvyn Novas, M D  Referring Provider: Corwin Levins, MD Primary Care Physician:  Oliver Barre, MD  Chief Complaint  Patient presents with  . Follow-up    insomnia, parkinsonism, rm 10     History   Social History  . Marital Status: Single    Spouse Name: N/A    Number of Children: 0  . Years of Education: BA   Occupational History  . TEACHER [employer]   Social History Main Topics  . Smoking status: Never Smoker   . Smokeless tobacco: Never Used  . Alcohol Use: No  . Drug Use: No  . Sexually Active: No   Other Topics Concern  . Not on file   Social History Narrative  . No narrative on file    Family History  Problem Relation Age of Onset  . Cancer Mother   . Heart disease Father   . Hypertension Father   . Heart disease Paternal Grandmother   . ALS Paternal Grandmother   .  Hypertension Sister     Past Medical History  Diagnosis Date  . Rotator cuff syndrome of left shoulder   . S/P partial hysterectomy   . Addiction, opium   . Therapy     on therapy by psychiatrist using sebutex   . 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 IBS (irritable bowel syndrome) 02/15/2011  . Bipolar disorder 02/15/2011  . Anxiety     ADHD CHRONIC ANXIETY  . Depression   . Diabetes mellitus     sees Dr Altheimer/endo  . Thyroid nodule     s/p biopsy - benign approx jan 2012  . Insomnia 02/21/2011  . Subacute dyskinesia due to drugs(333.85)     dr Byron Tipping/neuro  .  Resting tremor   . Cervical spondylosis without myelopathy   . Orthostatic hypotension   . Allergy     spring  . Hyperlipidemia   . Dysautonomia 10/24/2011  . Tremor   . Parkinsonism   . 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     opum  . Hypotension     orthostatic  . Dyskinesia      subacute,due to drugs  . Spondylosis without myelopathy   . POTS (postural orthostatic tachycardia syndrome)   . Tachycardia     syndrome  . Dyslipidemia   . Neuropathy     with paresthesias in feet  . Radiculopathy     cervical  . Multinodular thyroid     with large dominant solid nodule with calcifications in the lower left pole benign by FNA in 1/12 and stable by repeat ulltrasound in 4/13  . Depression   . 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 Ooporectomy    . Herniated disc cervical spine    . Left acromioplasty  2002    Dr Darrelyn Hillock  . Right breast lumpectomy    . Spine surgery  feb 2012    c5-6 fusion, Dr Shon Baton  . Lower back  1992  . Thyroid lobectomy  11/11    nodule biopsy, benign  . Abdominal hysterectomy      partial    Current Outpatient Prescriptions  Medication Sig Dispense Refill  . atorvastatin (LIPITOR) 40 MG tablet TAKE 1 TABLET (40 MG TOTAL) BY MOUTH DAILY.  90 tablet  3  . buPROPion (WELLBUTRIN) 100 MG tablet Take 100 mg by mouth 2 (two) times daily.      . clonazePAM (KLONOPIN) 1 MG tablet Take 1 mg by mouth 2 (two) times daily as needed for anxiety.      [name] ibuprofen (ADVIL,MOTRIN) 600 MG tablet Take 1 tablet (600 mg total) by mouth every 6 (six) hours as needed for pain.  60 tablet  3  . insulin glargine (LANTUS SOLOSTAR) 100 UNIT/ML injection Inject 50 Units into the skin daily. 50 units Dr. Leslie Dales      . insulin glulisine (APIDRA SOLOSTAR) 100 UNIT/ML injection Inject into the skin. Sliding Scale As needed Dr. Berenda Morale      . lisdexamfetamine (VYVANSE) 40 MG capsule Take 40 mg by mouth daily.      [name] NOVOLOG FLEXPEN 100  UNIT/ML SOPN FlexPen       . ONETOUCH VERIO test strip       . Pharmacist Choice Lancets MISC       . pindolol (VISKEN) 5 MG tablet Take 0.5 tablets (2.5 mg total) by mouth 2 (two) times daily.  90 tablet  3  .  risperiDONE (RISPERDAL) 2 MG tablet Take 2 mg by mouth daily.      [name] VIIBRYD 40 MG TABS TAKE 1 TABLET EVERY DAY  90 tablet  2  . zaleplon (SONATA) 10 MG capsule       . zolpidem (AMBIEN) 10 MG tablet TAKE 1 TABLET AT BEDTIME  30 tablet  5   No current facility-administered medications for this visit.    Allergies as of 09/09/2012 - Review Complete 09/09/2012  Allergen Reaction Noted  . Sinemet (carbidopa-levodopa)  09/09/2012  . Nsaids Other (See Comments) 03/04/2007  . Sulfonamide derivatives Rash 03/04/2007    Vitals: BP 115/78  Pulse 90  Resp 16  Ht 5\' 8"  (1.727 m)  Wt 172 lb (78.019 kg)  BMI 26.16 kg/m2 Last Weight:  Wt Readings from Last 1 Encounters:  09/09/12 172 lb (78.019 kg)   Last Height:   Ht Readings from Last 1 Encounters:  09/09/12 5\' 8"  (1.727 m)   BMI  .  Physical exam:  General: The patient is awake, alert and appears not in acute distress. The patient is well groomed. Head: Normocephalic, atraumatic, masked face . Neck is supple.  Cardiovascular:  Regular rate and rhythm 90bpm , without  murmurs or carotid bruit, and without distended neck veins. Respiratory: Lungs are clear to auscultation. Skin:  Without evidence of edema, or rash Trunk: patient has normal posture.  Neurologic exam : The patient is awake and alert, oriented to place and time.   Cranial nerves: Pupils are equal and briskly reactive to light. Funduscopic exam without evidence of pallor or edema. Extraocular movements  in vertical and horizontal planes intact and without nystagmus. Visual fields by finger perimetry are intact. Hearing to finger rub intact.  Facial sensation intact to fine touch. Facial motor strength is symmetric and tongue and uvula move midline.  Motor  exam:  Mildly elevated  tone and normal muscle bulk and symmetric normal strength in all extremities.  Sensory:  Fine touch, pinprick and vibration were tested in all extremities.  Decreased vibration in both ankles . Proprioception  normal.  Coordination: Rapid alternating movements in the fingers/hands is tested and normal. Finger-to-nose maneuver without evidence of ataxia, dysmetria and only mild  tremor.  Gait and station: Patient walks without assistive device . Strength within normal limits. Stance is stable and normal. Tandem gait is fragmented. Romberg testing revealed retropulsion. Deep tendon reflexes: in the  upper and lower extremities are symmetric and intact. Babinski maneuver response is downgoing.   Assessment:  After physical and neurologic examination, review of laboratory studies, imaging, neurophysiology testing and pre-existing records, assessment is that of a small fiber neuropathy dysautonomia, decreased sensory to vibration and fine filament touch in the lower extremities distal. For gait safety the patient should walk with supportive shoe was on the on even surfaces as much as possible. POTS makes it important for her to rest after postural changes and to catch her heart beat and blood pressure . She reports some tachycardia attacks when just sitting and not necessarily related to a postural change. All these can be diabetic induced polyneuropathies. I will offer Florinef as an addition to her current medications as she reports more presyncope or near fainting attacks.  testing could include nerve biopsy and sudero-motor testing , but would be performed at a tertiary care center.

## 2012-09-09 NOTE — Patient Instructions (Signed)
Diabetic Neuropathy  Diabetic neuropathy is a common complication caused by diabetes. Neuropathy is a term that means nerve disease or damage. If your diabetes is uncontrolled and you have high blood glucose (sugar) levels, over time, this can lead to damage to nerves throughout your body. There are three types of diabetic neuropathy:    Peripheral.   Autonomic.   Focal.  PERIPHERAL NEUROPATHY  Peripheral neuropathy is the most common form of diabetic neuropathy. It causes damage to the nerves of the feet and legs and eventually the hands and arms.   SYMPTOMS   Peripheral neuropathy occurs slowly over time. The peripheral nerves sense touch, hot and cold, and pain. When these nerves no longer work:    Your feet become numb.   You can no longer feel pressure or pain in your feet.   You may have burning, stabbing or aching pain.  This can lead to:   Thick calluses over pressure areas.   Pressure sores.   Ulcers. Ulcers can become infected with germs (bacteria) and can even lead to infection in the bones of the feet.  DIAGNOSIS   The diagnosis of diabetic neuropathy is difficult at best. Sensory function testing can be done with:   Light touch using a monofilament.   Vibration with tuning fork.   Sharp sensation with pin prick  Other tests that can help diagnose neuropathy are:   Nerve Conduction Velocities (NCV). This checks the transmission of electrical current through a nerve.   Electromyography (EMG). This shows how muscles respond to electrical signals transmitted by nearby nerves.   Quantitative sensory testing, which is used to assess how your nerves respond to vibration and changes in temperature.  AUTONOMIC NEUROPATHY  The autonomic nervous system controls functions that you do not think about. Examples would be:    Heart beat.   Regulation of body temperature.   Blood pressure.   Urination.   Digestion.   Sweating.   Sexual function.  SYMPTOMS    The symptoms of autonomic neuropathy vary depending on which nerves are affected.    There can be problems with digestion such as:   Feeling sick to your stomach (nausea).   Vomiting.   Bloating.   Constipation.   Diarrhea.   Abdominal pain.   Difficulty with urination may occur because of the inability to sense when your bladder is full. You may have urine leakage (incontinence) or inability to empty your bladder completely (retention).   Palpitations or a feeling of an abnormal heart beat.   Blood pressure drops on arising (orthostatic hypotension). This can happen when you first sit up or stand up. It causes you to feel:   Dizzy.   Weak.   Faint.   Sexual functioning:   In men, inability to attain and maintain an erection.   In women, vaginal dryness and problems with decreased sexual desire and arousal.  DIAGNOSIS   Diagnosis is often based on reported symptoms. Tell your medical caregiver if you experience:    Dizziness.   Constipation.   Diarrhea.   Inappropriate urination or inability to urinate.   Inability to get or maintain an erection.  Tests that may be done include:   An EKG or Holter Monitor. These are tests that can help show problems with the heart rate or heart rhythm.   X-rays can be used to find if there are problems with your ability to properly empty food from your stomach into the small intestine after eating.  FOCAL   inability to lift the foot properly. This causes abnormal walking or foot movement. DIAGNOSIS  Diagnosis is made based on your symptoms and what your caregiver finds on your exam. Other tests that may be done  include:  Nerve Conduction Velocities (NCV). This checks the transmission of electrical current through a nerve.  Electromyography (EMG). This shows how muscles respond to electrical signals transmitted by nearby nerves.  Quantitative sensory testing, which is used to assess how your nerves respond to vibration and changes in temperature. TREATMENT Once nerve damage occurs it cannot be reversed. The goal of treatment is to keep the disease from getting worse. If it gets worse, it will affect more nerve fibers. Controlling your blood (sugar) is the key. You will need to keep your blood glucose and A1c at the target range prescribed by your caregiver. Things that will help control blood glucose levels include:  Blood glucose monitoring.  Meal planning.  Physical activity.  Diabetes medication. Over time, maintaining lower blood glucose levels helps lessen symptoms. Sometimes, prescription pain medicine is needed. Focal neuropathy can be painful and unpredictable and occurs most often in older adults with diabetes.  SEEK MEDICAL CARE IF:   You develop peripheral nerve symptoms such as burning, numbness, or pain in your feet, legs or hands.  You develop autonomic nerve symptoms such as:  Dizziness.  Abnormal urinary control.  Inability to get an erection.  You develop focal nerve symptoms such as sudden abnormal eye movements or sudden foot drop. Document Released: 04/07/2001 Document Revised: 04/21/2011 Document Reviewed: 07/07/2008 [facility] Patient Information 2014 [place], [place]. Fall Prevention and Home Safety Falls cause injuries and can affect all age groups. It is possible to use preventive measures to significantly decrease the likelihood of falls. There are many simple measures which can make your home safer and prevent falls. OUTDOORS  Repair cracks and edges of walkways and driveways.  Remove high doorway thresholds.  Trim shrubbery on the main path into your  home.  Have good outside lighting.  Clear walkways of tools, rocks, debris, and clutter.  Check that handrails are not broken and are securely fastened. Both sides of steps should have handrails.  Have leaves, snow, and ice cleared regularly.  Use sand or salt on walkways during winter months.  In the garage, clean up grease or oil spills. BATHROOM  Install night lights.  Install grab bars by the toilet and in the tub and shower.  Use non-skid mats or decals in the tub or shower.  Place a plastic non-slip stool in the shower to sit on, if needed.  Keep floors dry and clean up all water on the floor immediately.  Remove soap buildup in the tub or shower on a regular basis.  Secure bath mats with non-slip, double-sided rug tape.  Remove throw rugs and tripping hazards from the floors. BEDROOMS  Install night lights.  Make sure a bedside light is easy to reach.  Do not use oversized bedding.  Keep a telephone by your bedside.  Have a firm chair with side arms to use for getting dressed.  Remove throw rugs and tripping hazards from the floor. KITCHEN  Keep handles on pots and pans turned toward the center of the stove. Use back burners when possible.  Clean up spills quickly and allow time for drying.  Avoid walking on wet floors.  Avoid hot utensils and knives.  Position shelves so they are not too high or low.  Place commonly used objects within easy reach.  If necessary, use a sturdy step stool with a grab bar when reaching.  Keep electrical cables out of the way.  Do not use floor polish or wax that makes floors slippery. If you must use wax, use non-skid floor wax.  Remove throw rugs and tripping hazards from the floor. STAIRWAYS  Never leave objects on stairs.  Place handrails on both sides of stairways and use them. Fix any loose handrails. Make sure handrails on both sides of the stairways are as long as the stairs.  Check carpeting to make  sure it is firmly attached along stairs. Make repairs to worn or loose carpet promptly.  Avoid placing throw rugs at the top or bottom of stairways, or properly secure the rug with carpet tape to prevent slippage. Get rid of throw rugs, if possible.  Have an electrician put in a light switch at the top and bottom of the stairs. OTHER FALL PREVENTION TIPS  Wear low-heel or rubber-soled shoes that are supportive and fit well. Wear closed toe shoes.  When using a stepladder, make sure it is fully opened and both spreaders are firmly locked. Do not climb a closed stepladder.  Add color or contrast paint or tape to grab bars and handrails in your home. Place contrasting color strips on first and last steps.  Learn and use mobility aids as needed. Install an electrical emergency response system.  Turn on lights to avoid dark areas. Replace light bulbs that burn out immediately. Get light switches that glow.  Arrange furniture to create clear pathways. Keep furniture in the same place.  Firmly attach carpet with non-skid or double-sided tape.  Eliminate uneven floor surfaces.  Select a carpet pattern that does not visually hide the edge of steps.  Be aware of all pets. OTHER HOME SAFETY TIPS  Set the water temperature for 120 F (48.8 C).  Keep emergency numbers on or near the telephone.  Keep smoke detectors on every level of the home and near sleeping areas. Document Released: 01/17/2002 Document Revised: 07/29/2011 Document Reviewed: 04/18/2011 [facility] Patient Information 2014 [place], [place].

## 2012-09-09 NOTE — Telephone Encounter (Signed)
Faxed hardcopy to [facility] [address]

## 2012-09-16 ENCOUNTER — Ambulatory Visit (INDEPENDENT_AMBULATORY_CARE_PROVIDER_SITE_OTHER): Payer: BC Managed Care – PPO | Admitting: Internal Medicine

## 2012-09-16 ENCOUNTER — Encounter: Payer: Self-pay | Admitting: Internal Medicine

## 2012-09-16 VITALS — BP 120/72 | HR 95 | Temp 98.1°F | Ht 67.0 in | Wt 171.0 lb

## 2012-09-16 DIAGNOSIS — E109 Type 1 diabetes mellitus without complications: Secondary | ICD-10-CM

## 2012-09-16 DIAGNOSIS — E785 Hyperlipidemia, unspecified: Secondary | ICD-10-CM

## 2012-09-16 DIAGNOSIS — F411 Generalized anxiety disorder: Secondary | ICD-10-CM

## 2012-09-16 NOTE — Assessment & Plan Note (Signed)
Pt cont's to follow with endo, has not seen in some time, prefers labs done there,  to f/u any worsening symptoms or concerns

## 2012-09-16 NOTE — Progress Notes (Signed)
Subjective:    Patient ID: Lynn Barnes, female    DOB: 1960/05/06, 52 y.o.   MRN: [id number]  HPI    Here to f/u; overall doing ok,  Pt denies chest pain, increased sob or doe, wheezing, orthopnea, PND, increased LE swelling, palpitations.  Has had 2 episodes syncope since here last.  Now on florinef x 5 days per neurology.   Pt denies polydipsia, polyuria, or low sugar symptoms such as weakness or confusion improved with po intake Cont's to see dr Althmier but not recently..  Pt denies new neurological symptoms such as new headache, or facial or extremity weakness or numbness.   Pt states overall good compliance with meds, has been trying to follow lower cholesterol, diabetic diet, with wt overall stable,  but little exercise however, states recently can only walk/job even 30 ft before marked increased HR and sob with LE heaviness force her to slow and sit.  Denies worsening depressive symptoms, suicidal ideation, or panic Past Medical History  Diagnosis Date  . Rotator cuff syndrome of left shoulder   . S/P partial hysterectomy   . Addiction, opium   . Therapy     on therapy by psychiatrist using sebutex   . 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 IBS (irritable bowel syndrome) 02/15/2011  . Bipolar disorder 02/15/2011  . Anxiety     ADHD CHRONIC ANXIETY  . Depression   . Diabetes mellitus     sees Dr Altheimer/endo  . Thyroid nodule     s/p biopsy - benign approx jan 2012  . Insomnia 02/21/2011  . Subacute dyskinesia due to drugs(333.85)     dr dohmeier/neuro  . Resting tremor   . Cervical spondylosis without myelopathy   . Orthostatic hypotension   . Allergy     spring  . Hyperlipidemia   . Dysautonomia 10/24/2011  . Tremor   . Parkinsonism   . 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     opum  . Hypotension     orthostatic  . Dyskinesia      subacute,due to drugs  . Spondylosis without myelopathy   . POTS (postural orthostatic tachycardia syndrome)   .  Tachycardia     syndrome  . Dyslipidemia   . Neuropathy     with paresthesias in feet  . Radiculopathy     cervical  . Multinodular thyroid     with large dominant solid nodule with calcifications in the lower left pole benign by FNA in 1/12 and stable by repeat ulltrasound in 4/13  . Depression   . 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 Ooporectomy    . Herniated disc cervical spine    . Left acromioplasty  2002    Dr Darrelyn Hillock  . Right breast lumpectomy    . Spine surgery  feb 2012    c5-6 fusion, Dr Shon Baton  . Lower back  1992  . Thyroid lobectomy  11/11    nodule biopsy, benign  . Abdominal hysterectomy      partial    reports that she has never smoked. She has never used smokeless tobacco. She reports that she does not drink alcohol or use illicit drugs. family history includes ALS in her paternal grandmother; Cancer in her mother; Heart disease in her father and paternal grandmother; and Hypertension in her father and sister. Allergies  Allergen Reactions  . Sinemet (Carbidopa-Levodopa)   . Nsaids  Other (See Comments)    Irritates stomach  . Sulfonamide Derivatives Rash   Current Outpatient Prescriptions on File Prior to Visit  Medication Sig Dispense Refill  . atorvastatin (LIPITOR) 40 MG tablet TAKE 1 TABLET (40 MG TOTAL) BY MOUTH DAILY.  90 tablet  3  . buPROPion (WELLBUTRIN) 100 MG tablet Take 100 mg by mouth 2 (two) times daily.      . clonazePAM (KLONOPIN) 1 MG tablet Take 1 mg by mouth 2 (two) times daily as needed for anxiety.      . fludrocortisone (FLORINEF) 0.1 MG tablet Take 1 tablet (0.1 mg total) by mouth 2 (two) times daily.  60 tablet  3  . ibuprofen (ADVIL,MOTRIN) 600 MG tablet Take 1 tablet (600 mg total) by mouth every 6 (six) hours as needed for pain.  60 tablet  3  . insulin glargine (LANTUS SOLOSTAR) 100 UNIT/ML injection Inject 50 Units into the skin daily. 50 units Dr. Leslie Dales      . insulin  glulisine (APIDRA SOLOSTAR) 100 UNIT/ML injection Inject into the skin. Sliding Scale As needed Dr. Berenda Morale      . methylphenidate (RITALIN) 20 MG tablet Take 20 mg by mouth 3 (three) times daily between meals.      [name] NOVOLOG FLEXPEN 100 UNIT/ML SOPN FlexPen       . ONETOUCH VERIO test strip       . Pharmacist Choice Lancets MISC       . pindolol (VISKEN) 5 MG tablet Take 0.5 tablets (2.5 mg total) by mouth 2 (two) times daily.  90 tablet  3  . risperiDONE (RISPERDAL) 2 MG tablet Take 2 mg by mouth daily.      [name] VIIBRYD 40 MG TABS TAKE 1 TABLET EVERY DAY  90 tablet  2  . zaleplon (SONATA) 10 MG capsule       . zolpidem (AMBIEN) 10 MG tablet TAKE 1 TABLET AT BEDTIME  30 tablet  5   No current facility-administered medications on file prior to visit.    Review of Systems  Constitutional: Negative for unexpected weight change, or unusual diaphoresis  HENT: Negative for tinnitus.   Eyes: Negative for photophobia and visual disturbance.  Respiratory: Negative for choking and stridor.   Gastrointestinal: Negative for vomiting and blood in stool.  Genitourinary: Negative for hematuria and decreased urine volume.  Musculoskeletal: Negative for acute joint swelling Skin: Negative for color change and wound.  Neurological: Negative for tremors and numbness other than noted  Psychiatric/Behavioral: Negative for decreased concentration or  hyperactivity.       Objective:   Physical Exam BP 120/72  Pulse 95  Temp(Src) 98.1 F (36.7 C) (Oral)  Ht 5\' 7"  (1.702 m)  Wt 171 lb (77.565 kg)  BMI 26.78 kg/m2  SpO2 97% VS noted,  Constitutional: Pt appears well-developed and well-nourished.  HENT: Head: NCAT.  Right Ear: External ear normal.  Left Ear: External ear normal.  Eyes: Conjunctivae and EOM are normal. Pupils are equal, round, and reactive to light.  Neck: Normal range of motion. Neck supple.  Cardiovascular: Normal rate and regular rhythm.   Pulmonary/Chest: Effort normal and  breath sounds normal.  Abd:  Soft, NT, non-distended, + BS Neurological: Pt is alert. Not confused  Skin: Skin is warm. No erythema.  Psychiatric: Pt behavior is normal. Thought content normal. mild nervous    Assessment & Plan:

## 2012-09-16 NOTE — Assessment & Plan Note (Signed)
By hx, apparently followed per Endo, for DM diet to continue

## 2012-09-16 NOTE — Patient Instructions (Signed)
Please continue all other medications as before, and refills have been done if requested. Please have the pharmacy call with any other refills you may need. Please keep your appointments with your specialists as you have planned  - Dr Altheimer and Dr Marylou Flesher as you have planned  No other changes today  Please return in 6 months, or sooner if needed

## 2012-09-16 NOTE — Assessment & Plan Note (Signed)
stable overall by history and exam, recent data reviewed with pt, and pt to continue medical treatment as before,  to f/u any worsening symptoms or concerns Lab Results  Component Value Date   WBC 6.7 03/22/2012   HGB 14.3 03/22/2012   HCT 41.6 03/22/2012   PLT 273.0 03/22/2012   GLUCOSE 176* 03/22/2012   NA 142 03/22/2012   K 3.5 03/22/2012   CL 104 03/22/2012   CREATININE 0.9 03/22/2012   BUN 16 03/22/2012   CO2 27 03/22/2012   TSH 1.31 03/22/2012   HGBA1C 8.3* 03/22/2012

## 2012-09-17 ENCOUNTER — Ambulatory Visit: Payer: BC Managed Care – PPO | Admitting: Internal Medicine

## 2012-09-21 ENCOUNTER — Telehealth: Payer: Self-pay | Admitting: *Deleted

## 2012-09-21 NOTE — Telephone Encounter (Signed)
Pt called states she failed to request a handicap placard during her last OV.  Please advise

## 2012-09-21 NOTE — Telephone Encounter (Signed)
Done hardcopy to robin  

## 2012-09-22 NOTE — Telephone Encounter (Signed)
Informed the patient that form requested has been mailed to her home

## 2012-11-26 ENCOUNTER — Ambulatory Visit (INDEPENDENT_AMBULATORY_CARE_PROVIDER_SITE_OTHER): Payer: BC Managed Care – PPO | Admitting: Internal Medicine

## 2012-11-26 ENCOUNTER — Encounter: Payer: Self-pay | Admitting: Internal Medicine

## 2012-11-26 VITALS — BP 138/90 | HR 96 | Ht 67.0 in | Wt 174.5 lb

## 2012-11-26 DIAGNOSIS — G909 Disorder of the autonomic nervous system, unspecified: Secondary | ICD-10-CM

## 2012-11-26 DIAGNOSIS — E1149 Type 2 diabetes mellitus with other diabetic neurological complication: Secondary | ICD-10-CM

## 2012-11-26 DIAGNOSIS — G2111 Neuroleptic induced parkinsonism: Secondary | ICD-10-CM

## 2012-11-26 DIAGNOSIS — T43591A Poisoning by other antipsychotics and neuroleptics, accidental (unintentional), initial encounter: Secondary | ICD-10-CM

## 2012-11-26 DIAGNOSIS — G219 Secondary parkinsonism, unspecified: Secondary | ICD-10-CM

## 2012-11-26 DIAGNOSIS — I951 Orthostatic hypotension: Secondary | ICD-10-CM

## 2012-11-26 DIAGNOSIS — R000 Tachycardia, unspecified: Secondary | ICD-10-CM

## 2012-11-26 DIAGNOSIS — E1143 Type 2 diabetes mellitus with diabetic autonomic (poly)neuropathy: Secondary | ICD-10-CM

## 2012-11-26 DIAGNOSIS — G90A Postural orthostatic tachycardia syndrome (POTS): Secondary | ICD-10-CM

## 2012-11-26 LAB — BASIC METABOLIC PANEL
BUN: 14 mg/dL (ref 6–23)
Calcium: 9.2 mg/dL (ref 8.4–10.5)
Creatinine, Ser: 1 mg/dL (ref 0.4–1.2)
GFR: 64.59 mL/min (ref >60.00)
Potassium: 3.9 mEq/L (ref 3.5–5.1)
Sodium: 138 mEq/L (ref 135–145)

## 2012-11-26 LAB — LIPID PANEL
Cholesterol: 161 mg/dL (ref 0–200)
LDL Cholesterol: 90 mg/dL (ref 0–99)

## 2012-11-26 MED ORDER — FLUDROCORTISONE ACETATE 0.1 MG PO TABS: ORAL_TABLET | ORAL | Status: DC

## 2012-11-26 NOTE — Patient Instructions (Addendum)
Your physician recommends that you have lab work today: BMP, LIPID and AST  Your physician wants you to follow-up in: 6 MONTHS with Dr Tenny Craw.  You will receive a reminder letter in the mail two months in advance. If you don't receive a letter, please call our office to schedule the follow-up appointment.  Your physician has recommended you make the following change in your medication: Please change Florinef to 0.05mg  in the morning and 0.05mg  at 2 PM

## 2012-11-28 NOTE — Progress Notes (Signed)
HPI Patient is a 52 yo with a history of drug induced Parkinson's and autonomic dysfunction.   She has been followed by Drs. John and Dohrmeir. I saw her earlier this year. SInce seen she has had several medicines changed.   She says she has occasional syncopal spells  Last in August.   Overall she is trying to keep active.  Doing some exercising  Core work Allergies  Allergen Reactions  . Nsaids Other (See Comments)    Irritates stomach  . Sulfonamide Derivatives Rash    Current Outpatient Prescriptions  Medication Sig Dispense Refill  . atorvastatin (LIPITOR) 40 MG tablet TAKE 1 TABLET (40 MG TOTAL) BY MOUTH DAILY.  90 tablet  3  . buPROPion (WELLBUTRIN) 100 MG tablet Take 100 mg by mouth 2 (two) times daily.      . clonazePAM (KLONOPIN) 1 MG tablet Take 1 mg by mouth 2 (two) times daily as needed for anxiety.      . fludrocortisone (FLORINEF) 0.1 MG tablet Take one-half tablet by mouth twice a day  30 tablet  6  . insulin glargine (LANTUS SOLOSTAR) 100 UNIT/ML injection Inject 50 Units into the skin daily. 50 units Dr. Leslie Dales      . insulin glulisine (APIDRA SOLOSTAR) 100 UNIT/ML injection Inject into the skin. Sliding Scale As needed Dr. Berenda Morale      . methylphenidate (RITALIN) 20 MG tablet Take 20 mg by mouth 3 (three) times daily between meals.      [name] NOVOLOG FLEXPEN 100 UNIT/ML SOPN FlexPen       . ONETOUCH VERIO test strip       . Pharmacist Choice Lancets MISC       . pindolol (VISKEN) 5 MG tablet Take 0.5 tablets (2.5 mg total) by mouth 2 (two) times daily.  90 tablet  3  . risperiDONE (RISPERDAL) 2 MG tablet Take 2 mg by mouth daily.      [name] VIIBRYD 40 MG TABS TAKE 1 TABLET EVERY DAY  90 tablet  2  . zolpidem (AMBIEN) 10 MG tablet TAKE 1 TABLET AT BEDTIME  30 tablet  5  . ibuprofen (ADVIL,MOTRIN) 600 MG tablet Take 1 tablet (600 mg total) by mouth every 6 (six) hours as needed for pain.  60 tablet  3   No current facility-administered medications for this visit.     Past Medical History  Diagnosis Date  . Rotator cuff syndrome of left shoulder   . S/P partial hysterectomy   . Addiction, opium   . Therapy     on therapy by psychiatrist using sebutex   . 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 IBS (irritable bowel syndrome) 02/15/2011  . Bipolar disorder 02/15/2011  . Anxiety     ADHD CHRONIC ANXIETY  . Depression   . Diabetes mellitus     sees Dr Altheimer/endo  . Thyroid nodule     s/p biopsy - benign approx jan 2012  . Insomnia 02/21/2011  . Subacute dyskinesia due to drugs(333.85)     dr dohmeier/neuro  . Resting tremor   . Cervical spondylosis without myelopathy   . Orthostatic hypotension   . Allergy     spring  . Hyperlipidemia   . Dysautonomia 10/24/2011  . Tremor   . Parkinsonism   . 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     opum  . Hypotension     orthostatic  . Dyskinesia      subacute,due to drugs  .  Spondylosis without myelopathy   . POTS (postural orthostatic tachycardia syndrome)   . Tachycardia     syndrome  . Dyslipidemia   . Neuropathy     with paresthesias in feet  . Radiculopathy     cervical  . Multinodular thyroid     with large dominant solid nodule with calcifications in the lower left pole benign by FNA in 1/12 and stable by repeat ulltrasound in 4/13  . Depression   . 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 Ooporectomy    . Herniated disc cervical spine    . Left acromioplasty  2002    Dr Darrelyn Hillock  . Right breast lumpectomy    . Spine surgery  feb 2012    c5-6 fusion, Dr Shon Baton  . Lower back  1992  . Thyroid lobectomy  11/11    nodule biopsy, benign  . Abdominal hysterectomy      partial    Family History  Problem Relation Age of Onset  . Cancer Mother   . Heart disease Father   . Hypertension Father   . Heart disease Paternal Grandmother   . ALS Paternal Grandmother   . Hypertension Sister     History   Social  History  . Marital Status: Single    Spouse Name: N/A    Number of Children: 0  . Years of Education: BA   Occupational History  . TEACHER [employer]   Social History Main Topics  . Smoking status: Never Smoker   . Smokeless tobacco: Never Used  . Alcohol Use: No  . Drug Use: No  . Sexual Activity: No   Other Topics Concern  . Not on file   Social History Narrative  . No narrative on file    Review of Systems:  All systems reviewed.  They are negative to the above problem except as previously stated.  Vital Signs: BP 138/90  P 96  Wt 174 lb. Physical Exam BP HEENT:  Normocephalic, atraumatic. EOMI, PERRLA.  Neck: JVP is normal.  No bruits.  Lungs: clear to auscultation. No rales no wheezes.  Heart: Regular rate and rhythm. Normal S1, S2. No S3.   No significant murmurs. PMI not displaced.  Abdomen:  Supple, nontender. Normal bowel sounds. No masses. No hepatomegaly.  Extremities:   Good distal pulses throughout. No lower extremity edema.  Musculoskeletal :moving all extremities.  Neuro:   alert and oriented x3.  CN II-XII grossly intact.  EKG  SR 88 bpm.   Assessment and Plan:  1.  Dysautonomia.  Patient is doing better on current regimen.  Still has occasional syncopal spells.  She is not orthostatic or tachycardic on exam today I would continue current regimen.  I have encouraged her to increase her physical activity to keep muscles toned, improve venous return. BP is a little high but I would continue on current regimen. Check labs today (BMET, lipid panel)  F/U in Spring

## 2012-12-16 ENCOUNTER — Other Ambulatory Visit: Payer: Self-pay

## 2012-12-17 ENCOUNTER — Other Ambulatory Visit: Payer: Self-pay

## 2012-12-17 DIAGNOSIS — Z1231 Encounter for screening mammogram for malignant neoplasm of breast: Secondary | ICD-10-CM

## 2013-01-07 ENCOUNTER — Ambulatory Visit
Admission: RE | Admit: 2013-01-07 | Discharge: 2013-01-07 | Disposition: A | Discharge status: Home / Self Care | Payer: BC Managed Care – PPO | Source: Ambulatory Visit

## 2013-01-07 DIAGNOSIS — Z1231 Encounter for screening mammogram for malignant neoplasm of breast: Secondary | ICD-10-CM

## 2013-01-07 LAB — HM MAMMOGRAPHY

## 2013-01-13 ENCOUNTER — Telehealth: Payer: Self-pay | Admitting: Neurology

## 2013-01-13 NOTE — Telephone Encounter (Signed)
LMVM to inform patient 1/29 appt has been cx due to Dr. Not being in the office. Needs to R/S

## 2013-02-17 ENCOUNTER — Other Ambulatory Visit: Payer: Self-pay | Admitting: Internal Medicine

## 2013-02-18 MED ORDER — ZOLPIDEM TARTRATE 10 MG PO TABS: ORAL_TABLET | ORAL | Status: DC

## 2013-02-18 NOTE — Telephone Encounter (Signed)
Done hardcopy to robin  

## 2013-02-18 NOTE — Telephone Encounter (Signed)
[facility]/lmb 

## 2013-02-23 ENCOUNTER — Other Ambulatory Visit: Payer: Self-pay | Admitting: Neurology

## 2013-02-28 ENCOUNTER — Ambulatory Visit: Payer: Self-pay | Admitting: Internal Medicine

## 2013-03-02 ENCOUNTER — Other Ambulatory Visit: Payer: Self-pay | Admitting: Internal Medicine

## 2013-03-08 ENCOUNTER — Other Ambulatory Visit: Payer: Self-pay | Admitting: Neurology

## 2013-03-08 ENCOUNTER — Other Ambulatory Visit: Payer: Self-pay | Admitting: Internal Medicine

## 2013-03-10 ENCOUNTER — Ambulatory Visit: Payer: BC Managed Care – PPO | Admitting: Neurology

## 2013-03-22 ENCOUNTER — Ambulatory Visit (INDEPENDENT_AMBULATORY_CARE_PROVIDER_SITE_OTHER): Payer: BC Managed Care – PPO | Admitting: Internal Medicine

## 2013-03-22 ENCOUNTER — Encounter: Payer: Self-pay | Admitting: Internal Medicine

## 2013-03-22 VITALS — BP 130/70 | HR 125 | Temp 98.0°F | Ht 67.0 in | Wt 170.1 lb

## 2013-03-22 DIAGNOSIS — M25559 Pain in unspecified hip: Secondary | ICD-10-CM

## 2013-03-22 DIAGNOSIS — G90A Postural orthostatic tachycardia syndrome (POTS): Secondary | ICD-10-CM

## 2013-03-22 DIAGNOSIS — I498 Other specified cardiac arrhythmias: Secondary | ICD-10-CM

## 2013-03-22 DIAGNOSIS — R000 Tachycardia, unspecified: Secondary | ICD-10-CM

## 2013-03-22 DIAGNOSIS — I951 Orthostatic hypotension: Secondary | ICD-10-CM

## 2013-03-22 DIAGNOSIS — M25551 Pain in right hip: Secondary | ICD-10-CM | POA: Insufficient documentation

## 2013-03-22 DIAGNOSIS — Z0000 Encounter for general adult medical examination without abnormal findings: Secondary | ICD-10-CM

## 2013-03-22 NOTE — Patient Instructions (Signed)
Please continue all other medications as before, and refills have been done if requested. Please have the pharmacy call with any other refills you may need. Please continue your efforts at being more active, low cholesterol diet, and weight control. You are otherwise up to date with prevention measures today. No further labs today Please keep your appointments with your specialists as you have planned - endocrinology, and cardiology, neurology; you may wish to ask about midodrine instead of the florinef  You have an appt tomorrow AM with Dr Tamala Julian at 0830, but this can be changed if needed (stop at the scheduling desk)  Please return in 6 months, or sooner if needed

## 2013-03-22 NOTE — Progress Notes (Signed)
Pre-visit discussion using our clinic review tool. No additional management support is needed unless otherwise documented below in the visit note.  

## 2013-03-22 NOTE — Progress Notes (Signed)
Subjective:    Patient ID: Lynn Barnes, female    DOB: [birth year], 53 y.o.   MRN: [id number]  HPI  Here for wellness and f/u;  Overall doing ok;  Pt denies CP, worsening SOB, DOE, wheezing, orthopnea, PND, worsening LE edema, palpitations, dizziness or syncope.  Pt denies neurological change such as new headache, facial or extremity weakness.  Pt denies polydipsia, polyuria, or low sugar symptoms. Pt states overall good compliance with treatment and medications, good tolerability, and has been trying to follow lower cholesterol diet.  Pt denies worsening depressive symptoms, suicidal ideation or panic. No fever, night sweats, wt loss, loss of appetite, or other constitutional symptoms.  Pt states good ability with ADL's, has low fall risk, home safety reviewed and adequate, no other significant changes in hearing or vision, and only occasionally active with exercise. Declines labs today- will have soon with endo, now in insulin pump. Stopped florinef on her own, did not work well, just gained fluid wt.  Also c/o right lateral hip pain, reproducible at 100 ft, has to lift right leg into car when getting in. Past Medical History  Diagnosis Date  . Rotator cuff syndrome of left shoulder   . S/P partial hysterectomy   . Addiction, opium   . Therapy     on therapy by psychiatrist using sebutex   . 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 IBS (irritable bowel syndrome) 02/15/2011  . Bipolar disorder 02/15/2011  . Anxiety     ADHD CHRONIC ANXIETY  . Depression   . Diabetes mellitus     sees Dr Altheimer/endo  . Thyroid nodule     s/p biopsy - benign approx jan 2012  . Insomnia 02/21/2011  . Subacute dyskinesia due to drugs(333.85)     dr dohmeier/neuro  . Resting tremor   . Cervical spondylosis without myelopathy   . Orthostatic hypotension   . Allergy     spring  . Hyperlipidemia   . Dysautonomia 10/24/2011  . Tremor   . Parkinsonism   . 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       opum  . Hypotension     orthostatic  . Dyskinesia      subacute,due to drugs  . Spondylosis without myelopathy   . POTS (postural orthostatic tachycardia syndrome)   . Tachycardia     syndrome  . Dyslipidemia   . Neuropathy     with paresthesias in feet  . Radiculopathy     cervical  . Multinodular thyroid     with large dominant solid nodule with calcifications in the lower left pole benign by FNA in 1/12 and stable by repeat ulltrasound in 4/13  . Depression   . 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 Ooporectomy    . Herniated disc cervical spine    . Left acromioplasty  2002    Dr Gladstone Lighter  . Right breast lumpectomy    . Spine surgery  feb 2012    c5-6 fusion, Dr Rolena Infante  . Lower back  1992  . Thyroid lobectomy  11/11    nodule biopsy, benign  . Abdominal hysterectomy      partial    reports that she has never smoked. She has never used smokeless tobacco. She reports that she does not drink alcohol or use illicit drugs. family history includes ALS in her paternal grandmother; Cancer in her mother; Heart disease in  her father and paternal grandmother; Hypertension in her father and sister. Allergies  Allergen Reactions  . Nsaids Other (See Comments)    Irritates stomach  . Sulfonamide Derivatives Rash   Current Outpatient Prescriptions on File Prior to Visit  Medication Sig Dispense Refill  . atorvastatin (LIPITOR) 40 MG tablet TAKE 1 TABLET (40 MG TOTAL) BY MOUTH DAILY.  90 tablet  3  . buPROPion (WELLBUTRIN) 100 MG tablet Take 100 mg by mouth 2 (two) times daily.      . clonazePAM (KLONOPIN) 1 MG tablet Take 1 mg by mouth 2 (two) times daily as needed for anxiety.      . fludrocortisone (FLORINEF) 0.1 MG tablet Take one-half tablet by mouth twice a day  30 tablet  6  . ibuprofen (ADVIL,MOTRIN) 600 MG tablet Take 1 tablet (600 mg total) by mouth every 6 (six) hours as needed for pain.  60 tablet  3  . insulin  glargine (LANTUS SOLOSTAR) 100 UNIT/ML injection Inject 50 Units into the skin daily. 50 units Dr. Elyse Hsu      . insulin glulisine (APIDRA SOLOSTAR) 100 UNIT/ML injection Inject into the skin. Sliding Scale As needed Dr. Kelvin Cellar      . methylphenidate (RITALIN) 20 MG tablet Take 20 mg by mouth 3 (three) times daily between meals.      Glory Rosebush VERIO test strip       . Pharmacist Choice Lancets MISC       . pindolol (VISKEN) 5 MG tablet TAKE 0.5 TABLETS TWICE A DAY (5 MG TOTAL) BY MOUTH DAILY.  90 tablet  0  . risperiDONE (RISPERDAL) 2 MG tablet Take 2 mg by mouth daily.      [name] VIIBRYD 40 MG TABS TAKE 1 TABLET EVERY DAY  90 tablet  0  . VIIBRYD 40 MG TABS TAKE 1 TABLET EVERY DAY  90 tablet  0  . zolpidem (AMBIEN) 10 MG tablet TAKE 1 TABLET BY MOUTH AT BEDTIME  30 tablet  5   No current facility-administered medications on file prior to visit.   Review of Systems  Constitutional: Negative for unexpected weight change, or unusual diaphoresis  HENT: Negative for tinnitus.   Eyes: Negative for photophobia and visual disturbance.  Respiratory: Negative for choking and stridor.   Gastrointestinal: Negative for vomiting and blood in stool.  Genitourinary: Negative for hematuria and decreased urine volume.  Musculoskeletal: Negative for acute joint swelling Skin: Negative for color change and wound.  Neurological: Negative for tremors and numbness other than noted  Psychiatric/Behavioral: Negative for decreased concentration or  hyperactivity.       Objective:   Physical Exam BP 130/70  Pulse 125  Temp(Src) 98 F (36.7 C) (Oral)  Ht 5\' 7"  (1.702 m)  Wt 170 lb 2 oz (77.168 kg)  BMI 26.64 kg/m2  SpO2 95% VS noted,  Constitutional: Pt appears well-developed and well-nourished..obese  HENT: Head: NCAT.  Right Ear: External ear normal.  Left Ear: External ear normal.  Eyes: Conjunctivae and EOM are normal. Pupils are equal, round, and reactive to light.  Neck: Normal range of  motion. Neck supple.  Cardiovascular: Normal rate and regular rhythm.   Pulmonary/Chest: Effort normal and breath sounds normal.  Neurological: Pt is alert. Not confused  Skin: Skin is warm. No erythema.  Has mod tender over right greater trochnter Psychiatric: Pt behavior is normal. Thought content normal.     Assessment & Plan:

## 2013-03-23 ENCOUNTER — Other Ambulatory Visit (INDEPENDENT_AMBULATORY_CARE_PROVIDER_SITE_OTHER): Payer: BC Managed Care – PPO

## 2013-03-23 ENCOUNTER — Ambulatory Visit (INDEPENDENT_AMBULATORY_CARE_PROVIDER_SITE_OTHER): Payer: BC Managed Care – PPO | Admitting: Family Medicine

## 2013-03-23 ENCOUNTER — Encounter: Payer: Self-pay | Admitting: Family Medicine

## 2013-03-23 ENCOUNTER — Ambulatory Visit: Payer: BC Managed Care – PPO | Admitting: Family Medicine

## 2013-03-23 VITALS — BP 124/74 | HR 98 | Temp 98.6°F | Resp 18 | Wt 171.0 lb

## 2013-03-23 DIAGNOSIS — M25551 Pain in right hip: Secondary | ICD-10-CM

## 2013-03-23 DIAGNOSIS — M25559 Pain in unspecified hip: Secondary | ICD-10-CM

## 2013-03-23 DIAGNOSIS — M7061 Trochanteric bursitis, right hip: Secondary | ICD-10-CM

## 2013-03-23 DIAGNOSIS — M76899 Other specified enthesopathies of unspecified lower limb, excluding foot: Secondary | ICD-10-CM

## 2013-03-23 NOTE — Progress Notes (Signed)
[name] [address] [place], [place] [address] Phone: [phone] Subjective:    I'm seeing this patient by the request  of:  Cathlean Cower, MD   CC: Right hip pain  [id number] Lynn Barnes is a 53 y.o. female coming in with complaint of right hip pain. Patient states that this is developed over the course of a couple months. Patient has started biking on a regular basis. Patient states that she has the pain now even with ambulation greater than 100 feet. Patient states it is worse with stairs. Can be tender to palpation, but denies any radiation down the leg, any numbness or weakness of the lower extremity. Patient states though that it has awakened her at night when she puts pressure on that side. Patient tries not to take any over-the-counter medications to do her heart issue. Patient rates the severity of 6/10 and describes the pain is more of a dull aching sensation with a sharp pain with palpation.    Past medical history, social, surgical and family history all reviewed in electronic medical record.   Review of Systems: No headache, visual changes, nausea, vomiting, diarrhea, constipation, dizziness, abdominal pain, skin rash, fevers, chills, night sweats, weight loss, swollen lymph nodes, body aches, joint swelling, muscle aches, chest pain, shortness of breath, mood changes.   Objective Blood pressure 124/74, pulse 98, temperature 98.6 F (37 C), temperature source Oral, resp. rate 18, weight 171 lb 0.6 oz (77.583 kg), SpO2 95.00%.  General: No apparent distress alert and oriented x3 mood and affect normal, dressed appropriately.  HEENT: Pupils equal, extraocular movements intact  Respiratory: Patient's speak in full sentences and does not appear short of breath  Cardiovascular: No lower extremity edema, non tender, no erythema  Skin: Warm dry intact with no signs of infection or rash on extremities or on axial skeleton.  Abdomen: Soft  nontender  Neuro: Cranial nerves II through XII are intact, neurovascularly intact in all extremities with 2+ DTRs and 2+ pulses.  Lymph: No lymphadenopathy of posterior or anterior cervical chain or axillae bilaterally.  Gait normal with good balance and coordination.  MSK:  Non tender with full range of motion and good stability and symmetric strength and tone of shoulders, elbows, wrist, knee and ankles bilaterally.  Hip: ROM IR: 35 Deg, ER: 45 Deg, Flexion: 120 Deg, Extension: 100 Deg, Abduction: 45 Deg, Adduction: 45 Deg Strength IR: 5/5, ER: 5/5, Flexion: 5/5, Extension: 5/5, Abduction: 5/5, Adduction: 5/5 Pelvic alignment unremarkable to inspection and palpation. Standing hip rotation and gait without trendelenburg sign / unsteadiness. Greater trochanter with significant tenderness to palpation. No tenderness over piriformis. Positive Faber neg fadir No SI joint tenderness and normal minimal SI movement.  MSK US performed of: Right hip This study was ordered, performed, and interpreted by Charlann Boxer D.O.  Hip: Trochanteric bursa with significant swelling and mild effusion. Acetabular labrum visualized and without tears, displacement, or effusion in joint. Femoral neck appears unremarkable without increased power doppler signal along Cortex.  IMPRESSION:  Greater trochanteric bursitis   Procedure: Real-time Ultrasound Guided Injection of right greater trochanteric bursitis secondary to patient's body habitus Device: GE Logiq E  Ultrasound guided injection is preferred based studies that show increased duration, increased effect, greater accuracy, decreased procedural pain, increased response rate, and decreased cost with ultrasound guided versus blind injection.  Verbal informed consent obtained.  Time-out conducted.  Noted no overlying erythema, induration, or other signs of local infection.  Skin prepped  in a sterile fashion.  Local anesthesia: Topical Ethyl chloride.    With sterile technique and under real time ultrasound guidance:  Greater trochanteric area was visualized and patient's bursa was noted. A 22-gauge 3 inch needle was inserted and 4 cc of 0.5% Marcaine and 1 cc of Kenalog 40 mg/dL was injected. Pictures taken Completed without difficulty  Pain immediately resolved suggesting accurate placement of the medication.  Advised to call if fevers/chills, erythema, induration, drainage, or persistent bleeding.  Images permanently stored and available for review in the ultrasound unit.  Impression: Technically successful ultrasound guided injection.    Impression and Recommendations:     This case required medical decision making of moderate complexity.

## 2013-03-23 NOTE — Patient Instructions (Signed)
Very nice to meet you Starting to morrow do exercises most days of the week Vitamin D 2000 IU daily Ice 20 minutes 2 times a day Raise seat on your bike.  Come back in 3 weeks.

## 2013-03-23 NOTE — Progress Notes (Signed)
Pre-visit discussion using our clinic review tool. No additional management support is needed unless otherwise documented below in the visit note.  

## 2013-03-23 NOTE — Assessment & Plan Note (Signed)
Patient did have injection today with complete resolution of pain. Discussed seated position on her bike which will be helpful. Given home exercise program Discussed icing regimen With patient's history of heart condition a like to avoid any significant medications at this time. Patient come back in 2-3 weeks for further evaluation.

## 2013-03-26 NOTE — Assessment & Plan Note (Signed)
Suggested to pt to ask about midodrine with next card appt

## 2013-03-26 NOTE — Assessment & Plan Note (Signed)
Possible bursitis - for sports med referral

## 2013-03-26 NOTE — Assessment & Plan Note (Signed)

## 2013-04-13 ENCOUNTER — Ambulatory Visit (INDEPENDENT_AMBULATORY_CARE_PROVIDER_SITE_OTHER): Payer: BC Managed Care – PPO | Admitting: Family Medicine

## 2013-04-13 ENCOUNTER — Encounter: Payer: Self-pay | Admitting: Family Medicine

## 2013-04-13 VITALS — BP 124/76 | HR 61 | Temp 97.6°F | Resp 16 | Wt 170.0 lb

## 2013-04-13 DIAGNOSIS — M7061 Trochanteric bursitis, right hip: Secondary | ICD-10-CM

## 2013-04-13 DIAGNOSIS — M76899 Other specified enthesopathies of unspecified lower limb, excluding foot: Secondary | ICD-10-CM

## 2013-04-13 NOTE — Assessment & Plan Note (Signed)
Patient did extremely well after the injection. Patient is to have a trace amount pain from time to time and was given more advanced exercises today. Patient will e-mail in one week and is continuing to have pain will consider starting nitroglycerin. Patient was warned of potential side effects. Patient is doing much better overall so I do not think that this will be necessary. Patient will come back again in 3 weeks if not perfect.

## 2013-04-13 NOTE — Progress Notes (Signed)
Pre visit review using our clinic review tool, if applicable. No additional management support is needed unless otherwise documented below in the visit note. 

## 2013-04-13 NOTE — Progress Notes (Signed)
  [name] [address] [place], [place] [address] Phone: [phone] Subjective:    I CC: Right hip pain  [id number] Lynn Barnes is a 53 y.o. female coming in with complaint of right hip pain followup. Patient was found to have greater trochanteric bursitis and did have an ultrasound-guided injection. Patient states that she is 95% better. Doing the exercises on a fairly regular basis which is minimal pain after getting up for 10 seconds from a seated position. Patient states as long as she starts to ambulate she does not have any pain. Patient states she is resting comfortably at night. Overall she is very happy with the results.    Past medical history, social, surgical and family history all reviewed in electronic medical record.   Review of Systems: No headache, visual changes, nausea, vomiting, diarrhea, constipation, dizziness, abdominal pain, skin rash, fevers, chills, night sweats, weight loss, swollen lymph nodes, body aches, joint swelling, muscle aches, chest pain, shortness of breath, mood changes.   Objective Blood pressure 124/76, pulse 61, temperature 97.6 F (36.4 C), temperature source Oral, resp. rate 16, weight 170 lb (77.111 kg), SpO2 96.00%.  General: No apparent distress alert and oriented x3 mood and affect normal, dressed appropriately.  HEENT: Pupils equal, extraocular movements intact  Respiratory: Patient's speak in full sentences and does not appear short of breath  Cardiovascular: No lower extremity edema, non tender, no erythema  Skin: Warm dry intact with no signs of infection or rash on extremities or on axial skeleton.  Abdomen: Soft nontender  Neuro: Cranial nerves II through XII are intact, neurovascularly intact in all extremities with 2+ DTRs and 2+ pulses.  Lymph: No lymphadenopathy of posterior or anterior cervical chain or axillae bilaterally.  Gait normal with good balance and coordination.  MSK:  Non  tender with full range of motion and good stability and symmetric strength and tone of shoulders, elbows, wrist, knee and ankles bilaterally.  Hip: ROM IR: 35 Deg, ER: 45 Deg, Flexion: 120 Deg, Extension: 100 Deg, Abduction: 45 Deg, Adduction: 45 Deg Strength IR: 5/5, ER: 5/5, Flexion: 5/5, Extension: 5/5, Abduction: 5/5, Adduction: 5/5 Pelvic alignment unremarkable to inspection and palpation. Standing hip rotation and gait without trendelenburg sign / unsteadiness. Greater trochanter minimal tenderness No tenderness over piriformis. Negative Faber neg fadir No SI joint tenderness and normal minimal SI movement.  .    Impression and Recommendations:     This case required medical decision making of moderate complexity.

## 2013-04-13 NOTE — Patient Instructions (Signed)
Good to see you Continue other exercises 3 times a week Add in wall exercises with butt and heel on wall raises 6 inches hold 2 seconds down slow foor count of 4 3 sets of 10.  If too easy add a ankle weight.  Email me in 1 week if not improving and we will start nitro.  Come back again in 3 weeks if not perfect.

## 2013-04-18 ENCOUNTER — Encounter: Payer: Self-pay | Admitting: Internal Medicine

## 2013-05-06 ENCOUNTER — Ambulatory Visit: Payer: BC Managed Care – PPO | Admitting: Internal Medicine

## 2013-05-23 ENCOUNTER — Other Ambulatory Visit: Payer: Self-pay | Admitting: Neurology

## 2013-05-29 ENCOUNTER — Other Ambulatory Visit: Payer: Self-pay | Admitting: Internal Medicine

## 2013-06-03 ENCOUNTER — Encounter: Payer: Self-pay | Admitting: Internal Medicine

## 2013-06-03 ENCOUNTER — Ambulatory Visit (INDEPENDENT_AMBULATORY_CARE_PROVIDER_SITE_OTHER): Payer: BC Managed Care – PPO | Admitting: Internal Medicine

## 2013-06-03 VITALS — BP 140/80 | HR 115 | Ht 67.0 in | Wt 162.0 lb

## 2013-06-03 DIAGNOSIS — R000 Tachycardia, unspecified: Principal | ICD-10-CM

## 2013-06-03 DIAGNOSIS — I498 Other specified cardiac arrhythmias: Secondary | ICD-10-CM

## 2013-06-03 DIAGNOSIS — G90A Postural orthostatic tachycardia syndrome (POTS): Secondary | ICD-10-CM

## 2013-06-03 DIAGNOSIS — I951 Orthostatic hypotension: Principal | ICD-10-CM

## 2013-06-03 MED ORDER — PINDOLOL 5 MG PO TABS: 5.0000 mg | ORAL_TABLET | Freq: Two times a day (BID) | ORAL | Status: DC

## 2013-06-03 NOTE — Progress Notes (Signed)
HPI Patient is a 53 yo with a history of drug induced Parkinson's and autonomic dysfunction.   She has been followed by Drs. John and Dohrmeir. When I saw her last I recomm pindolol. Since seen she says she has felt about the same  Has had only 1 episode of syncope  Did not get hurt.  Allergies  Allergen Reactions  . Nsaids Other (See Comments)    Irritates stomach  . Sulfonamide Derivatives Rash    Current Outpatient Prescriptions  Medication Sig Dispense Refill  . atorvastatin (LIPITOR) 40 MG tablet TAKE 1 TABLET (40 MG TOTAL) BY MOUTH DAILY.  90 tablet  3  . buPROPion (WELLBUTRIN) 100 MG tablet Take 100 mg by mouth 2 (two) times daily.      . clonazePAM (KLONOPIN) 1 MG tablet Take 1 mg by mouth 2 (two) times daily as needed for anxiety.      . fludrocortisone (FLORINEF) 0.1 MG tablet Take one-half tablet by mouth twice a day  30 tablet  6  . ibuprofen (ADVIL,MOTRIN) 600 MG tablet Take 1 tablet (600 mg total) by mouth every 6 (six) hours as needed for pain.  60 tablet  3  . methylphenidate (RITALIN) 20 MG tablet Take 20 mg by mouth 3 (three) times daily between meals.      Glory Rosebush VERIO test strip       . Pharmacist Choice Lancets MISC       . pindolol (VISKEN) 5 MG tablet Take 1 tablet (5 mg total) by mouth 2 (two) times daily.  180 tablet  3  . risperiDONE (RISPERDAL) 2 MG tablet Take 2 mg by mouth daily.      [name] VIIBRYD 40 MG TABS TAKE 1 TABLET EVERY DAY  90 tablet  0  . VIIBRYD 40 MG TABS TAKE 1 TABLET EVERY DAY  90 tablet  1  . zolpidem (AMBIEN) 10 MG tablet TAKE 1 TABLET BY MOUTH AT BEDTIME  30 tablet  5  . NOVOLOG 100 UNIT/ML injection Insulin pump       No current facility-administered medications for this visit.    Past Medical History  Diagnosis Date  . Rotator cuff syndrome of left shoulder   . S/P partial hysterectomy   . Addiction, opium   . Therapy     on therapy by psychiatrist using sebutex   . Parkinsonism     due to prolonged prozac/risperdl for yrs/Dr  Dohmeir  . IBS (irritable bowel syndrome) 02/15/2011  . Bipolar disorder 02/15/2011  . Anxiety     ADHD CHRONIC ANXIETY  . Depression   . Diabetes mellitus     sees Dr Altheimer/endo  . Thyroid nodule     s/p biopsy - benign approx jan 2012  . Insomnia 02/21/2011  . Subacute dyskinesia due to drugs(333.85)     dr dohmeier/neuro  . Resting tremor   . Cervical spondylosis without myelopathy   . Orthostatic hypotension   . Allergy     spring  . Hyperlipidemia   . Dysautonomia 10/24/2011  . Tremor   . Parkinsonism   . 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     opum  . Hypotension     orthostatic  . Dyskinesia      subacute,due to drugs  . Spondylosis without myelopathy   . POTS (postural orthostatic tachycardia syndrome)   . Tachycardia     syndrome  . Dyslipidemia   . Neuropathy     with paresthesias in feet  . Radiculopathy  cervical  . Multinodular thyroid     with large dominant solid nodule with calcifications in the lower left pole benign by FNA in 1/12 and stable by repeat ulltrasound in 4/13  . Depression   . 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 Ooporectomy    . Herniated disc cervical spine    . Left acromioplasty  2002    Dr Gladstone Lighter  . Right breast lumpectomy    . Spine surgery  feb 2012    c5-6 fusion, Dr Rolena Infante  . Lower back  1992  . Thyroid lobectomy  11/11    nodule biopsy, benign  . Abdominal hysterectomy      partial    Family History  Problem Relation Age of Onset  . Cancer Mother   . Heart disease Father   . Hypertension Father   . Heart disease Paternal Grandmother   . ALS Paternal Grandmother   . Hypertension Sister     History   Social History  . Marital Status: Single    Spouse Name: N/A    Number of Children: 0  . Years of Education: BA   Occupational History  . [employer]   Social History Main Topics  . Smoking status: Never Smoker   . Smokeless tobacco: Never  Used  . Alcohol Use: No  . Drug Use: No  . Sexual Activity: No   Other Topics Concern  . Not on file   Social History Narrative  . No narrative on file    Review of Systems:  All systems reviewed.  They are negative to the above problem except as previously stated.  Vital Signs: BP 140/80  P 115  Wt 1622 lb ON my check pulse 80 bpm   Physical Exam BP HEENT:  Normocephalic, atraumatic. EOMI, PERRLA.  Neck: JVP is normal.  No bruits.  Lungs: clear to auscultation. No rales no wheezes.  Heart: Regular rate and rhythm. Normal S1, S2. No S3.   No significant murmurs. PMI not displaced.  Abdomen:  Supple, nontender. Normal bowel sounds. No masses. No hepatomegaly.  Extremities:   Good distal pulses throughout. No lower extremity edema.  Musculoskeletal :moving all extremities.  Neuro  Deferred  EKG  SR 115   bpm.   Assessment and Plan:  1.  Dysautonomia.  I have recomm that she try to increase pindolol to 5 bid.  Continue to push fluids Check BMET.    She has f/u later this summer with neuro.

## 2013-06-03 NOTE — Patient Instructions (Signed)
Your physician wants you to follow-up in: [place] will receive a reminder letter in the mail two months in advance. If you don't receive a letter, please call our office to schedule the follow-up appointment.   INCREASE PINDOLOL TO 5 MG TWICE DAILY GRADUALLY  Your physician recommends that you HAVE LAB WORK TODAY

## 2013-06-06 LAB — BASIC METABOLIC PANEL
BUN: 24 mg/dL — ABNORMAL HIGH (ref 6–23)
CHLORIDE: 101 meq/L (ref 96–112)
CO2: 26 meq/L (ref 19–32)
CREATININE: 1.3 mg/dL — AB (ref 0.4–1.2)
Calcium: 9.5 mg/dL (ref 8.4–10.5)
GFR: 46.67 mL/min — ABNORMAL LOW (ref >60.00)
Glucose, Bld: 168 mg/dL — ABNORMAL HIGH (ref 70–99)
Potassium: 4.1 mEq/L (ref 3.5–5.1)
Sodium: 140 mEq/L (ref 135–145)

## 2013-06-27 ENCOUNTER — Ambulatory Visit (INDEPENDENT_AMBULATORY_CARE_PROVIDER_SITE_OTHER): Payer: BC Managed Care – PPO | Admitting: Adult Health

## 2013-06-27 ENCOUNTER — Encounter: Payer: Self-pay | Admitting: Adult Health

## 2013-06-27 VITALS — BP 143/87 | HR 125 | Ht 67.5 in | Wt 157.5 lb

## 2013-06-27 DIAGNOSIS — G219 Secondary parkinsonism, unspecified: Secondary | ICD-10-CM

## 2013-06-27 DIAGNOSIS — I951 Orthostatic hypotension: Principal | ICD-10-CM

## 2013-06-27 DIAGNOSIS — G909 Disorder of the autonomic nervous system, unspecified: Secondary | ICD-10-CM

## 2013-06-27 DIAGNOSIS — G90A Postural orthostatic tachycardia syndrome (POTS): Secondary | ICD-10-CM

## 2013-06-27 DIAGNOSIS — I498 Other specified cardiac arrhythmias: Secondary | ICD-10-CM

## 2013-06-27 DIAGNOSIS — R000 Tachycardia, unspecified: Principal | ICD-10-CM

## 2013-06-27 NOTE — Patient Instructions (Signed)

## 2013-06-27 NOTE — Progress Notes (Signed)
PATIENT: Lynn Barnes DOB: [birth year]  REASON FOR VISIT: follow up HISTORY FROM: patient  HISTORY OF PRESENT ILLNESS: Lynn Barnes is a 53 year old female with a history of Neuroleptic-induced Parkinsonism, postural orthostatic tachycardia syndrome and diabetic autonomic neuropathy. She returns today for follow-up. The patient is currently being seen by psychiatry and is on Risperdal. In the past she has tried to wean off this medication but was not successful. She is currently on viibyrd and is tolerating it well. She reports that this has  helped her depression and is better tolerated. Since her last visit she has remained stable. She continues to have a mild tremor in both hands. Denies having any falls. Denies trouble swallowing. Patient continues to have near syncopal episodes but it is better with the pindolol. She is having trouble sleeping. States that it can take up to 3 hours for her to fall sleep. Then she will wake up frequently. She is unsure if the Ambien is helping. Patient does watch TV in bed. Feels like she is watching the clock all night. Denies daytime sleepiness. No new medical issues since the last visit.   REVIEW OF SYSTEMS: Full 14 system review of systems performed and notable only for:  Constitutional: fatigue Eyes: eye discharge Ear/Nose/Throat: N/A  Skin: N/A  Cardiovascular: palpitations  Respiratory: shortness of breath, chest tightness Gastrointestinal: N/A  Genitourinary: N/A Hematology/Lymphatic: N/A  Endocrine: N/A Musculoskeletal:back pain, muscle cramps, walking difficulty, neck pain  Allergy/Immunology: N/A  Neurological: memory loss, weakness, tremors, passing out,  Psychiatric: depression, nervous/anxious, hyperactive,  Sleep: insomnia, frequent waking, snoring   ALLERGIES: Allergies  Allergen Reactions  . Nsaids Other (See Comments)    Irritates stomach  . Sulfonamide Derivatives Rash    HOME MEDICATIONS: Outpatient Prescriptions Prior  to Visit  Medication Sig Dispense Refill  . atorvastatin (LIPITOR) 40 MG tablet TAKE 1 TABLET (40 MG TOTAL) BY MOUTH DAILY.  90 tablet  3  . buPROPion (WELLBUTRIN) 100 MG tablet Take 100 mg by mouth 2 (two) times daily.      . clonazePAM (KLONOPIN) 1 MG tablet Take 1 mg by mouth 2 (two) times daily as needed for anxiety.      . methylphenidate (RITALIN) 20 MG tablet Take 20 mg by mouth 3 (three) times daily between meals.      [name] NOVOLOG 100 UNIT/ML injection Insulin pump      . pindolol (VISKEN) 5 MG tablet Take 1 tablet (5 mg total) by mouth 2 (two) times daily.  180 tablet  3  . risperiDONE (RISPERDAL) 2 MG tablet Take 2 mg by mouth daily.      [name] VIIBRYD 40 MG TABS TAKE 1 TABLET EVERY DAY  90 tablet  1  . zolpidem (AMBIEN) 10 MG tablet TAKE 1 TABLET BY MOUTH AT BEDTIME  30 tablet  5  . VIIBRYD 40 MG TABS TAKE 1 TABLET EVERY DAY  90 tablet  0  . fludrocortisone (FLORINEF) 0.1 MG tablet Take one-half tablet by mouth twice a day  30 tablet  6  . ibuprofen (ADVIL,MOTRIN) 600 MG tablet Take 1 tablet (600 mg total) by mouth every 6 (six) hours as needed for pain.  60 tablet  3  . ONETOUCH VERIO test strip       . Pharmacist Choice Lancets MISC        No facility-administered medications prior to visit.    PAST MEDICAL HISTORY: Past Medical History  Diagnosis Date  . Rotator cuff syndrome of  left shoulder   . S/P partial hysterectomy   . Addiction, opium   . Therapy     on therapy by psychiatrist using sebutex   . 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 IBS (irritable bowel syndrome) 02/15/2011  . Bipolar disorder 02/15/2011  . Anxiety     ADHD CHRONIC ANXIETY  . Depression   . Diabetes mellitus     sees Dr Altheimer/endo  . Thyroid nodule     s/p biopsy - benign approx jan 2012  . Insomnia 02/21/2011  . Subacute dyskinesia due to drugs(333.85)     dr dohmeier/neuro  . Resting tremor   . Cervical spondylosis without myelopathy   . Orthostatic  hypotension   . Allergy     spring  . Hyperlipidemia   . Dysautonomia 10/24/2011  . Tremor   . Parkinsonism   . 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     opum  . Hypotension     orthostatic  . Dyskinesia      subacute,due to drugs  . Spondylosis without myelopathy   . POTS (postural orthostatic tachycardia syndrome)   . Tachycardia     syndrome  . Dyslipidemia   . Neuropathy     with paresthesias in feet  . Radiculopathy     cervical  . Multinodular thyroid     with large dominant solid nodule with calcifications in the lower left pole benign by FNA in 1/12 and stable by repeat ulltrasound in 4/13  . Depression   . 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 Ooporectomy    . Herniated disc cervical spine    . Left acromioplasty  2002    Dr Gladstone Lighter  . Right breast lumpectomy    . Spine surgery  feb 2012    c5-6 fusion, Dr Rolena Infante  . Lower back  1992  . Thyroid lobectomy  11/11    nodule biopsy, benign  . Abdominal hysterectomy      partial    FAMILY HISTORY: Family History  Problem Relation Age of Onset  . Cancer Mother   . Heart disease Father   . Hypertension Father   . Heart disease Paternal Grandmother   . ALS Paternal Grandmother   . Hypertension Sister     SOCIAL HISTORY: History   Social History  . Marital Status: Single    Spouse Name: N/A    Number of Children: 0  . Years of Education: BA   Occupational History  . [employer]   Social History Main Topics  . Smoking status: Never Smoker   . Smokeless tobacco: Never Used  . Alcohol Use: No  . Drug Use: No  . Sexual Activity: No   Other Topics Concern  . Not on file   Social History Narrative   Patient is single and lives with my partner ([place]).   Patient is disabled.   Patient is right-handed.   Patient has a Haematologist.   Patient drinks one soda every other day.      PHYSICAL  EXAM  Filed Vitals:   06/27/13 1525  BP: 143/87  Pulse: 125  Height: 5' 7.5" (1.715 m)  Weight: 157 lb 8 oz (71.442 kg)   Body mass index is 24.29 kg/(m^2).  Generalized: Well developed, in no acute distress   Neurological examination  Mentation: Alert oriented to time, place, history taking. Follows  all commands speech and language fluent Cranial nerve II-XII:  Extraocular movements were full, visual field were full on confrontational test.  Motor: The motor testing reveals 5 over 5 strength of all 4 extremities. Good symmetric motor tone is noted throughout.  Sensory: Sensory testing is intact to soft touch on all 4 extremities.  No evidence of extinction is noted.  Coordination: Cerebellar testing reveals good finger-nose-finger and heel-to-shin bilaterally. Mild tremor in bilateral upper extremities. Gait and station: Gait is normal with limited arm swing in both extremities. Tandem gait is normal. Romberg is negative. No drift is seen.  Reflexes: Deep tendon reflexes are symmetric but depressed  DIAGNOSTIC DATA (LABS, IMAGING, TESTING) - I reviewed patient records, labs, notes, testing and imaging myself where available.  Lab Results  Component Value Date   WBC 6.7 03/22/2012   HGB 14.3 03/22/2012   HCT 41.6 03/22/2012   MCV 93.5 03/22/2012   PLT 273.0 03/22/2012      Component Value Date/Time   NA 140 06/03/2013 1506   K 4.1 06/03/2013 1506   CL 101 06/03/2013 1506   CO2 26 06/03/2013 1506   GLUCOSE 168* 06/03/2013 1506   BUN 24* 06/03/2013 1506   CREATININE 1.3* 06/03/2013 1506   CALCIUM 9.5 06/03/2013 1506   AST 17 11/26/2012 0915   GFRNONAA >60 03/22/2010 1251   GFRAA  Value: >60        The eGFR has been calculated using the MDRD equation. This calculation has not been validated in all clinical situations. eGFR's persistently <60 mL/min signify possible Chronic Kidney Disease. 03/22/2010 1251   Lab Results  Component Value Date   CHOL 161 11/26/2012   HDL 52.00 11/26/2012    LDLCALC 90 11/26/2012   TRIG 95.0 11/26/2012   CHOLHDL 3 11/26/2012   Lab Results  Component Value Date   HGBA1C 8.3* 03/22/2012   No results found for this basename: VITAMINB12   Lab Results  Component Value Date   TSH 1.31 03/22/2012      ASSESSMENT AND PLAN 53 y.o. year old female  has a past medical history of Rotator cuff syndrome of left shoulder; S/P partial hysterectomy; Addiction, opium; Therapy; Parkinsonism; IBS (irritable bowel syndrome) (02/15/2011); Bipolar disorder (02/15/2011); Anxiety; Depression; Diabetes mellitus; Thyroid nodule; Insomnia (02/21/2011); Subacute dyskinesia due to drugs(333.85); Resting tremor; Cervical spondylosis without myelopathy; Orthostatic hypotension; Allergy; Hyperlipidemia; Dysautonomia (10/24/2011); Tremor; Parkinsonism; 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; Hypotension; Dyskinesia; Spondylosis without myelopathy; POTS (postural orthostatic tachycardia syndrome); Tachycardia; Dyslipidemia; Neuropathy; Radiculopathy; Multinodular thyroid; Depression; and 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 here with   1. Secondary parkinsonism 2.POTS (postural orthostatic tachycardia syndrome) 3. Peripheral autonomic neuropathy in disorders classified elsewhere(337.1)  Patient has remained stable. Patient continues to have near syncopal episodes but states it has improved since she was started on pindolol. Viibryd is working well for her. Will continue, patient does not need refills today. Patient having trouble falling asleep. She is currently taking Ambien but feels that it is no longer working. Patient does not endorse good sleep hygiene. She watching TV in bed while trying to fall asleep. She is constantly looking at the clock during th night. I gave the patient a handout with recommendations to help insomnia. I advised the patient after 2-3 weeks of changing her sleep routine if it does not improve to call the office and we would consider a new medication. In the past the patient  has tried [place] without success. Patient should follow-up in 6 months or sooner if needed.  Ward Givens, MSN, NP-C 06/27/2013, 3:56 PM [facility] [address] [place], [place] [address] [phone]  Note: This document was prepared with digital dictation and possible smart phrase technology. Any transcriptional errors that result from this process are unintentional.

## 2013-06-27 NOTE — Progress Notes (Signed)
I agree with the assessment and plan as directed by NP .The patient is known to me .   Kai Calico, MD  

## 2013-07-07 ENCOUNTER — Other Ambulatory Visit: Payer: Self-pay | Admitting: Internal Medicine

## 2013-07-25 LAB — HM DIABETES EYE EXAM

## 2013-07-26 ENCOUNTER — Encounter: Payer: Self-pay | Admitting: Internal Medicine

## 2013-08-15 ENCOUNTER — Telehealth: Payer: Self-pay | Admitting: *Deleted

## 2013-08-15 DIAGNOSIS — E1149 Type 2 diabetes mellitus with other diabetic neurological complication: Secondary | ICD-10-CM

## 2013-08-15 NOTE — Telephone Encounter (Signed)
Patient has an appointment Lipid, bmet, a1c ordered Diabetic bundle

## 2013-09-06 ENCOUNTER — Encounter: Payer: Self-pay | Admitting: Internal Medicine

## 2013-09-20 ENCOUNTER — Ambulatory Visit: Payer: BC Managed Care – PPO | Admitting: Internal Medicine

## 2013-09-27 ENCOUNTER — Other Ambulatory Visit (INDEPENDENT_AMBULATORY_CARE_PROVIDER_SITE_OTHER): Payer: BC Managed Care – PPO

## 2013-09-27 ENCOUNTER — Encounter: Payer: Self-pay | Admitting: Internal Medicine

## 2013-09-27 ENCOUNTER — Encounter: Payer: Self-pay | Admitting: Family Medicine

## 2013-09-27 ENCOUNTER — Ambulatory Visit (INDEPENDENT_AMBULATORY_CARE_PROVIDER_SITE_OTHER): Payer: BC Managed Care – PPO | Admitting: Family Medicine

## 2013-09-27 ENCOUNTER — Ambulatory Visit (INDEPENDENT_AMBULATORY_CARE_PROVIDER_SITE_OTHER): Payer: BC Managed Care – PPO | Admitting: Internal Medicine

## 2013-09-27 VITALS — BP 132/88 | HR 115 | Ht 67.5 in | Wt 157.0 lb

## 2013-09-27 VITALS — BP 132/88 | HR 115 | Temp 98.4°F | Wt 157.5 lb

## 2013-09-27 DIAGNOSIS — M25559 Pain in unspecified hip: Secondary | ICD-10-CM

## 2013-09-27 DIAGNOSIS — M7061 Trochanteric bursitis, right hip: Secondary | ICD-10-CM

## 2013-09-27 DIAGNOSIS — M25551 Pain in right hip: Secondary | ICD-10-CM

## 2013-09-27 DIAGNOSIS — M76899 Other specified enthesopathies of unspecified lower limb, excluding foot: Secondary | ICD-10-CM

## 2013-09-27 DIAGNOSIS — Z23 Encounter for immunization: Secondary | ICD-10-CM

## 2013-09-27 DIAGNOSIS — Z0000 Encounter for general adult medical examination without abnormal findings: Secondary | ICD-10-CM

## 2013-09-27 DIAGNOSIS — E109 Type 1 diabetes mellitus without complications: Secondary | ICD-10-CM

## 2013-09-27 NOTE — Assessment & Plan Note (Signed)
The patient tolerated the injection very well. We discussed the home exercises again and patient was given another handout as well as hip abductor strengthening exercises. We discussed an icing protocol I think will be also very beneficial. Discussed the patient is going to need to monitor her blood glucose levels closely.  Patient will try these interventions and come back again in 4 weeks. Discuss the possibility of formal physical therapy which patient declined.

## 2013-09-27 NOTE — Assessment & Plan Note (Signed)

## 2013-09-27 NOTE — Progress Notes (Signed)
Subjective:    Patient ID: Lynn Barnes, female    DOB: 26-Nov-1960, 53 y.o.   MRN: [id number]  HPI   Here for wellness and f/u;  Overall doing ok;  Pt denies CP, worsening SOB, DOE, wheezing, orthopnea, PND, worsening LE edema, palpitations, dizziness or syncope.  Pt denies neurological change such as new headache, facial or extremity weakness.  Pt denies polydipsia, polyuria, or low sugar symptoms. Pt states overall good compliance with treatment and medications, good tolerability, and has been trying to follow lower cholesterol diet.  Pt denies worsening depressive symptoms, suicidal ideation or panic. No fever, night sweats, wt loss, loss of appetite, or other constitutional symptoms.  Pt states good ability with ADL's, has low fall risk, home safety reviewed and adequate, no other significant changes in hearing or vision, and only occasionally active with exercise. Still with significant issues with doe/palpitations with exertion, now on pindolol per cardiology, pt not sure if better so far. Sees Dr Elyse Hsu Jaclyn Prime for sugar, for labs due aug 26 and appt sept 1. Still with right lateral hip pain, some better with recent cortisone but back worse than ever last few days Past Medical History  Diagnosis Date  . Rotator cuff syndrome of left shoulder   . S/P partial hysterectomy   . Addiction, opium   . Therapy     on therapy by psychiatrist using sebutex   . 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 IBS (irritable bowel syndrome) 02/15/2011  . Bipolar disorder 02/15/2011  . Anxiety     ADHD CHRONIC ANXIETY  . Depression   . Diabetes mellitus     sees Dr Altheimer/endo  . Thyroid nodule     s/p biopsy - benign approx jan 2012  . Insomnia 02/21/2011  . Subacute dyskinesia due to drugs(333.85)     dr dohmeier/neuro  . Resting tremor   . Cervical spondylosis without myelopathy   . Orthostatic hypotension   . Allergy     spring  . Hyperlipidemia   .  Dysautonomia 10/24/2011  . Tremor   . Parkinsonism   . 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     opum  . Hypotension     orthostatic  . Dyskinesia      subacute,due to drugs  . Spondylosis without myelopathy   . POTS (postural orthostatic tachycardia syndrome)   . Tachycardia     syndrome  . Dyslipidemia   . Neuropathy     with paresthesias in feet  . Radiculopathy     cervical  . Multinodular thyroid     with large dominant solid nodule with calcifications in the lower left pole benign by FNA in 1/12 and stable by repeat ulltrasound in 4/13  . Depression   . 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 Ooporectomy    . Herniated disc cervical spine    . Left acromioplasty  2002    Dr Gladstone Lighter  . Right breast lumpectomy    . Spine surgery  feb 2012    c5-6 fusion, Dr Rolena Infante  . Lower back  1992  . Thyroid lobectomy  11/11    nodule biopsy, benign  . Abdominal hysterectomy      partial    reports that she has never smoked. She has never used smokeless tobacco. She reports that she does not drink alcohol or use illicit drugs. family history includes ALS in her paternal  grandmother; Cancer in her mother; Heart disease in her father and paternal grandmother; Hypertension in her father and sister. Allergies  Allergen Reactions  . Nsaids Other (See Comments)    Irritates stomach  . Sulfonamide Derivatives Rash   Current Outpatient Prescriptions on File Prior to Visit  Medication Sig Dispense Refill  . atorvastatin (LIPITOR) 40 MG tablet TAKE 1 TABLET (40 MG TOTAL) BY MOUTH DAILY.  90 tablet  3  . buPROPion (WELLBUTRIN) 100 MG tablet Take 100 mg by mouth 2 (two) times daily.      . clonazePAM (KLONOPIN) 1 MG tablet Take 1 mg by mouth 2 (two) times daily as needed for anxiety.      [name] glucose blood test strip 1 each by Other route. 7 times per day   - as instructed      . methylphenidate (RITALIN) 20 MG tablet Take 20 mg by mouth 3 (three) times  daily between meals.      [name] NOVOLOG 100 UNIT/ML injection Insulin pump      . pindolol (VISKEN) 5 MG tablet Take 1 tablet (5 mg total) by mouth 2 (two) times daily.  180 tablet  3  . risperiDONE (RISPERDAL) 2 MG tablet Take 2 mg by mouth daily.      [name] VIIBRYD 40 MG TABS TAKE 1 TABLET EVERY DAY  90 tablet  1  . zolpidem (AMBIEN) 10 MG tablet TAKE 1 TABLET BY MOUTH AT BEDTIME  30 tablet  5   No current facility-administered medications on file prior to visit.     Review of Systems Constitutional: Negative for increased diaphoresis, other activity, appetite or other siginficant weight change  HENT: Negative for worsening hearing loss, ear pain, facial swelling, mouth sores and neck stiffness.   Eyes: Negative for other worsening pain, redness or visual disturbance.  Respiratory: Negative for shortness of breath and wheezing.   Cardiovascular: Negative for chest pain and palpitations.  Gastrointestinal: Negative for diarrhea, blood in stool, abdominal distention or other pain Genitourinary: Negative for hematuria, flank pain or change in urine volume.  Musculoskeletal: Negative for myalgias or other joint complaints.  Skin: Negative for color change and wound.  Neurological: Negative for syncope and numbness. other than noted Hematological: Negative for adenopathy. or other swelling Psychiatric/Behavioral: Negative for hallucinations, self-injury, decreased concentration or other worsening agitation.      Objective:   Physical Exam BP 132/88  Pulse 115  Temp(Src) 98.4 F (36.9 C) (Oral)  Wt 157 lb 8 oz (71.442 kg)  SpO2 97% VS noted,  Constitutional: Pt is oriented to person, place, and time. Appears well-developed and well-nourished.  Head: Normocephalic and atraumatic.  Right Ear: External ear normal.  Left Ear: External ear normal.  Nose: Nose normal.  Mouth/Throat: Oropharynx is clear and moist.  Eyes: Conjunctivae and EOM are normal. Pupils are equal, round, and reactive to  light.  Neck: Normal range of motion. Neck supple. No JVD present. No tracheal deviation present.  Cardiovascular: Normal rate, regular rhythm, normal heart sounds and intact distal pulses.   Pulmonary/Chest: Effort normal and breath sounds without rales or wheezing  Abdominal: Soft. Bowel sounds are normal. NT. No HSM  Musculoskeletal: Normal range of motion. Exhibits no edema.  Lymphadenopathy:  Has no cervical adenopathy.  Neurological: Pt is alert and oriented to person, place, and time. Pt has normal reflexes. No cranial nerve deficit. Motor grossly intact Skin: Skin is warm and dry. No rash noted.  Psychiatric:  Has normal mood and affect. Behavior  is normal.  Tender over rigth greater trochanter       Assessment & Plan:

## 2013-09-27 NOTE — Assessment & Plan Note (Signed)
Will ask Dr Smith/sport med to see if can see in f/u today

## 2013-09-27 NOTE — Assessment & Plan Note (Signed)
Followed per endo, pt declines further labs

## 2013-09-27 NOTE — Patient Instructions (Signed)
You had the flu shot today  Please continue all other medications as before, and refills have been done if requested.  Please have the pharmacy call with any other refills you may need.  Please continue your efforts at being more active, low cholesterol diet, and weight control.  You are otherwise up to date with prevention measures today.  Please keep your appointments with your specialists as you may have planned - Dr Altheimer for Diabetes, and Dr Harrington Challenger for POTS  We can hold on further labs today  OK to "check out" from this visit; Please return in 1 year for your yearly visit, or sooner if needed  Then go to [employer] in the Lobby to check back in for Dr Smith/sports medicine for the right hip

## 2013-09-27 NOTE — Progress Notes (Signed)
Pre visit review using our clinic review tool, if applicable. No additional management support is needed unless otherwise documented below in the visit note. 

## 2013-09-27 NOTE — Patient Instructions (Signed)
It is good to see you Ice 20 minutes 2 times daily. Usually after activity and before bed. Watch your sugars  Exercises 3 times a week.  Exercises on wall.  Heel and butt touching.  Raise leg 6 inches and hold 2 seconds.  Down slow for count of 4 seconds.  1 set of 30 reps daily on both sides.  Come back again in 4 weeks if not completely gone.

## 2013-09-27 NOTE — Progress Notes (Signed)
[name] [place],  [id number] Phone: [phone] Subjective:    I CC: Right hip pain follow up  Lynn Barnes is a 53 y.o. female coming in with complaint of right hip pain followup. Patient has been previously seen greater than 5 months ago. Patient was given an injection into the greater trochanteric bursitis previously. Patient did have good results for 5 months. Patient states that the pain started coming back with her not doing exercises on a regular basis. Patient states it is a soreness going up stairs can be in tolerable. Patient states also rolling on that side can be severely painful. Denies any radiation down the leg or any weakness.  Past medical history, social, surgical and family history all reviewed in electronic medical record.   Review of Systems: No headache, visual changes, nausea, vomiting, diarrhea, constipation, dizziness, abdominal pain, skin rash, fevers, chills, night sweats, weight loss, swollen lymph nodes, body aches, joint swelling, muscle aches, chest pain, shortness of breath, mood changes.   Objective Blood pressure 132/88, pulse 115, height 5' 7.5" (1.715 m), weight 157 lb (71.215 kg), SpO2 97.00%.  General: No apparent distress alert and oriented x3 mood and affect normal, dressed appropriately.  HEENT: Pupils equal, extraocular movements intact  Respiratory: Patient's speak in full sentences and does not appear short of breath  Cardiovascular: No lower extremity edema, non tender, no erythema  Skin: Warm dry intact with no signs of infection or rash on extremities or on axial skeleton.  Abdomen: Soft nontender  Neuro: Cranial nerves II through XII are intact, neurovascularly intact in all extremities with 2+ DTRs and 2+ pulses.  Lymph: No lymphadenopathy of posterior or anterior cervical chain or axillae bilaterally.  Gait normal with good balance and coordination.  MSK:  Non tender  with full range of motion and good stability and symmetric strength and tone of shoulders, elbows, wrist, knee and ankles bilaterally.  Hip: Right ROM IR: 35 Deg, ER: 45 Deg, Flexion: 120 Deg, Extension: 100 Deg, Abduction: 45 Deg, Adduction: 45 Deg Strength IR: 5/5, ER: 5/5, Flexion: 5/5, Extension: 5/5, Abduction: 4/5, Adduction: 5/5 Pelvic alignment unremarkable to inspection and palpation. Standing hip rotation and gait without trendelenburg sign / unsteadiness. Greater trochanter severe tenderness to palpation No tenderness over piriformis. Negative Faber neg fadir No SI joint tenderness and normal minimal SI movement.  MSK US performed of: Right hip  This study was ordered, performed, and interpreted by Charlann Boxer D.O.  Hip:  Trochanteric bursa with significant swelling and mild effusion.  Acetabular labrum visualized and without tears, displacement, or effusion in joint.  Femoral neck appears unremarkable without increased power doppler signal along Cortex.  IMPRESSION: Greater trochanteric bursitis  Procedure: Real-time Ultrasound Guided Injection of right greater trochanteric bursitis secondary to patient's body habitus  Device: GE Logiq E  Ultrasound guided injection is preferred based studies that show increased duration, increased effect, greater accuracy, decreased procedural pain, increased response rate, and decreased cost with ultrasound guided versus blind injection.  Verbal informed consent obtained.  Time-out conducted.  Noted no overlying erythema, induration, or other signs of local infection.  Skin prepped in a sterile fashion.  Local anesthesia: Topical Ethyl chloride.  With sterile technique and under real time ultrasound guidance: Greater trochanteric area was visualized and patient's bursa was noted. A 22-gauge 3 inch needle was inserted and 4 cc of 0.5% Marcaine and 1 cc of Kenalog 40 mg/dL was injected. Pictures taken  Completed without difficulty  Pain  immediately resolved suggesting accurate placement of the medication.  Advised to call if fevers/chills, erythema, induration, drainage, or persistent bleeding.  Images permanently stored and available for review in the ultrasound      Impression and Recommendations:     This case required medical decision making of moderate complexity.

## 2013-10-04 ENCOUNTER — Ambulatory Visit: Payer: BC Managed Care – PPO | Admitting: Neurology

## 2013-10-25 ENCOUNTER — Ambulatory Visit (INDEPENDENT_AMBULATORY_CARE_PROVIDER_SITE_OTHER): Payer: BC Managed Care – PPO | Admitting: Family Medicine

## 2013-10-25 ENCOUNTER — Ambulatory Visit (INDEPENDENT_AMBULATORY_CARE_PROVIDER_SITE_OTHER)
Admission: RE | Admit: 2013-10-25 | Discharge: 2013-10-25 | Disposition: A | Discharge status: Home / Self Care | Payer: BC Managed Care – PPO | Source: Ambulatory Visit | Attending: Family Medicine | Admitting: Family Medicine

## 2013-10-25 VITALS — BP 122/64 | HR 90 | Ht 67.5 in | Wt 153.0 lb

## 2013-10-25 DIAGNOSIS — M25551 Pain in right hip: Secondary | ICD-10-CM

## 2013-10-25 DIAGNOSIS — M25559 Pain in unspecified hip: Secondary | ICD-10-CM

## 2013-10-25 MED ORDER — DICLOFENAC SODIUM 2 % TD SOLN: TRANSDERMAL | Status: DC

## 2013-10-25 NOTE — Progress Notes (Signed)
  [name] [address], [place] [address] Phone: [phone] Subjective:    I CC: Right hip pain follow up  [id number] Lynn Barnes is a 53 y.o. female coming in with complaint of right hip pain followup. Patient had been previously seen for her greater trochanteric bursitis. Patient did respond multiple months ago to a corticosteroid injection. Patient was last seen one month ago and had a repeat injection under ultrasound guidance. Patient states that unfortunately this only lasted a 1-2 days. Patient states that the pain started to come back thereafter and has been fairly severe. Patient has been doing home exercises and does get some improvement with the trial of topical anti-inflammatory she was given previously. Patient does state that sometimes she does not remember to do the exercises. States that she still has pain at night. Does have some pain it seems to be associated with her back. Patient has had a history of neck fusion previously and states that she's had a history of a herniated disc in her lower back. Reviewing patient's chart she has had a nondisplaced fracture of the coccygeal segment.  Past medical history, social, surgical and family history all reviewed in electronic medical record.   Review of Systems: No headache, visual changes, nausea, vomiting, diarrhea, constipation, dizziness, abdominal pain, skin rash, fevers, chills, night sweats, weight loss, swollen lymph nodes, body aches, joint swelling, muscle aches, chest pain, shortness of breath, mood changes.   Objective Blood pressure 122/64, pulse 90, height 5' 7.5" (1.715 m), weight 153 lb (69.4 kg), SpO2 95.00%.  General: No apparent distress alert and oriented x3 mood and affect normal, dressed appropriately. Mild feces noted HEENT: Pupils equal, extraocular movements intact  Respiratory: Patient's speak in full sentences and does not appear short of breath    Cardiovascular: No lower extremity edema, non tender, no erythema  Skin: Warm dry intact with no signs of infection or rash on extremities or on axial skeleton.  Abdomen: Soft nontender  Neuro: Cranial nerves II through XII are intact, neurovascularly intact in all extremities with 2+ DTRs and 2+ pulses.  Lymph: No lymphadenopathy of posterior or anterior cervical chain or axillae bilaterally.  Gait normal with good balance and coordination.  MSK:  Non tender with full range of motion and good stability and symmetric strength and tone of shoulders, elbows, wrist, knee and ankles bilaterally. Patient does have a resting tremor of the left arm Hip: Right ROM IR: 35 Deg, ER: 45 Deg, Flexion: 120 Deg, Extension: 100 Deg, Abduction: 45 Deg, Adduction: 45 Deg Strength IR: 5/5, ER: 5/5, Flexion: 5/5, Extension: 5/5, Abduction: 4/5, Adduction: 5/5 Pelvic alignment unremarkable to inspection and palpation. Standing hip rotation and gait without trendelenburg sign / unsteadiness. Greater trochanter severe tenderness to palpation patient does have some tenderness over the gluteal muscles as well. Negative straight leg test No tenderness over piriformis. Negative Faber neg fadir No SI joint tenderness and normal minimal SI movement.      Impression and Recommendations:     This case required medical decision making of moderate complexity.

## 2013-10-25 NOTE — Patient Instructions (Signed)
It is always good to see you Ice is your friend Continue the exercises and alternate with back exercises Try pennsaid 2 times daily.  Xrays downstairs today.  Physical therapy will be calling you.  Come back again in 4 weeks

## 2013-10-26 ENCOUNTER — Encounter: Payer: Self-pay | Admitting: Family Medicine

## 2013-10-26 NOTE — Assessment & Plan Note (Signed)
Patient continues to have right lateral hip pain. No groin pain noted. I do believe further workup is necessary and x-rays are ordered today. X-rays are ordered, reviewed and interpreted by me today. Patient's x-rays of her hip is unremarkable x-rays of her lumbar spine shows that there is certainly X9-B7 facet arthropathy. Patient was sent to formal physical therapy today and was given a prescription for topical anti-inflammatories. Patient was warned the potential side effects and we'll use is more on an as-needed basis. Differential includes radicular symptoms from the lumbar spine versus potential peripheral nerve neuropathy secondary to her diabetes. Patient has not have any more signs or symptoms of the bursitis which she responded to previously. This is a recurrent bursitis that we would need to think of further intervention. Patient continues to have pain at followup in 4-6 weeks we'll consider further imaging such as a CT scan of her lumbar spine and hip.  Spent greater than 25 minutes with patient face-to-face and had greater than 50% of counseling including as described above in assessment and plan.

## 2013-11-09 ENCOUNTER — Telehealth: Payer: Self-pay | Admitting: *Deleted

## 2013-11-09 NOTE — Telephone Encounter (Signed)
Left msg on triage stating she had xray done week or so ago never received results. Pls advise...Johny Chess

## 2013-11-09 NOTE — Telephone Encounter (Signed)
Discussed results with patient

## 2013-11-13 ENCOUNTER — Other Ambulatory Visit: Payer: Self-pay | Admitting: Neurology

## 2013-11-22 ENCOUNTER — Encounter: Payer: Self-pay | Admitting: Family Medicine

## 2013-11-22 ENCOUNTER — Ambulatory Visit (INDEPENDENT_AMBULATORY_CARE_PROVIDER_SITE_OTHER): Payer: BC Managed Care – PPO | Admitting: Family Medicine

## 2013-11-22 VITALS — BP 110/70 | HR 89 | Ht 67.5 in | Wt 156.0 lb

## 2013-11-22 DIAGNOSIS — M5416 Radiculopathy, lumbar region: Secondary | ICD-10-CM

## 2013-11-22 DIAGNOSIS — M25551 Pain in right hip: Secondary | ICD-10-CM

## 2013-11-22 DIAGNOSIS — M7061 Trochanteric bursitis, right hip: Secondary | ICD-10-CM

## 2013-11-22 NOTE — Assessment & Plan Note (Signed)
Discussed with patient at great length. Patient's x-rays of the hip were unremarkable but continues to have difficulty with ambulation. Patient denies any groin pain mostly did not think that this is intra-articular. Still likely recurrent greater trochanteric bursitis. I would like to rule out though any lumbar radiculopathy-type symptoms and CT scan of the lumbar spine was today. I do think patient has failed all other conservative therapy including corticosteroid injections. Patient is going to formal physical therapy. We did discuss the possibility of PRP injections that I think would be safe and potentially effective for this patient with her comorbidities. Patient was highly optimistic at this possibility as well. Patient will come back in 4 weeks and if giving [place] continued difficulty we will consider doing the PRP injection. All questions were answered and encouraged patient continued home exercises regularly. Patient will cause of the next 48 hours if she does not get a call from physical therapy.

## 2013-11-22 NOTE — Patient Instructions (Signed)
Good to see you. You are doing great overall We will get CT scan of low back to rule out any nerve impingement.  Continue the topical medicine.  Ice is your friend.  Continue the exercises If you have not heard from PT by Thursday give [place] a call.  We will call you to get the PRP ready.  Otherwise make an appointment in the next 4 weeks.

## 2013-11-22 NOTE — Progress Notes (Signed)
  [name] [address], [place] [address] Phone: [phone] Subjective:    CC: Right hip pain follow up  [name] is a 53 y.o. female coming in with complaint of right hip pain followup. Patient had been previously seen for her greater trochanteric bursitis. Patient did respond multiple months ago to a corticosteroid injection. Patient then had unfortunately a failure after ultrasound-guided injection. The patient continued to have difficulty and pain. Patient was sent to formal physical therapy to see if this would help her greater trochanteric bursitis that was seen previously. Patient has other comorbidities that complicate the picture. Patient was recommended to do home exercises and over-the-counter medications as well. Patient states unfortunately did not make any significant improvement since last visit but now is only having pain when she walks. Patient continues with topical anti-inflammatory. Patient unfortunately never received a call from formal physical therapy at this time. Patient is becoming somewhat frustrated with it and would like it to be of course pain-free.  Past medical history significant for an anterior C5-C6 fusion secondary to osteophytic changes. We have x-rayed patient back at that did show facet arthropathy at L5-S1 previously.   Past medical history, social, surgical and family history all reviewed in electronic medical record.   Review of Systems: No headache, visual changes, nausea, vomiting, diarrhea, constipation, dizziness, abdominal pain, skin rash, fevers, chills, night sweats, weight loss, swollen lymph nodes, body aches, joint swelling, muscle aches, chest pain, shortness of breath, mood changes.   Objective Blood pressure 110/70, pulse 89, height 5' 7.5" (1.715 m), weight 156 lb (70.761 kg), SpO2 97.00%.  General: No apparent distress alert and oriented x3 mood and affect normal, dressed  appropriately. Mild feces noted HEENT: Pupils equal, extraocular movements intact  Respiratory: Patient's speak in full sentences and does not appear short of breath  Cardiovascular: No lower extremity edema, non tender, no erythema  Skin: Warm dry intact with no signs of infection or rash on extremities or on axial skeleton.  Abdomen: Soft nontender  Neuro: Cranial nerves II through XII are intact, neurovascularly intact in all extremities with 2+ DTRs and 2+ pulses.  Lymph: No lymphadenopathy of posterior or anterior cervical chain or axillae bilaterally.  Gait normal with good balance and coordination.  MSK:  Non tender with full range of motion and good stability and symmetric strength and tone of shoulders, elbows, wrist, knee and ankles bilaterally. Patient does have a resting tremor of the left arm Hip: Right ROM IR: 35 Deg, ER: 45 Deg, Flexion: 120 Deg, Extension: 100 Deg, Abduction: 45 Deg, Adduction: 45 Deg Strength IR: 5/5, ER: 5/5, Flexion: 5/5, Extension: 5/5, Abduction: 4/5, Adduction: 5/5 Pelvic alignment unremarkable to inspection and palpation. Standing hip rotation and gait without trendelenburg sign / unsteadiness. Greater trochanter severe tenderness to palpation patient does have some tenderness over the gluteal muscles as well. Negative straight leg test No tenderness over piriformis. Negative Faber neg fadir No SI joint tenderness and normal minimal SI movement. Mild tenderness with straight leg test on the right side.     Impression and Recommendations:     This case required medical decision making of moderate complexity.

## 2013-11-25 ENCOUNTER — Other Ambulatory Visit: Payer: Self-pay

## 2013-11-29 ENCOUNTER — Ambulatory Visit (INDEPENDENT_AMBULATORY_CARE_PROVIDER_SITE_OTHER)
Admission: RE | Admit: 2013-11-29 | Discharge: 2013-11-29 | Disposition: A | Discharge status: Home / Self Care | Payer: BC Managed Care – PPO | Source: Ambulatory Visit | Attending: Family Medicine | Admitting: Family Medicine

## 2013-11-29 DIAGNOSIS — M5416 Radiculopathy, lumbar region: Secondary | ICD-10-CM

## 2013-12-02 ENCOUNTER — Ambulatory Visit: Payer: BC Managed Care – PPO | Admitting: Internal Medicine

## 2013-12-06 ENCOUNTER — Ambulatory Visit: Payer: BC Managed Care – PPO | Attending: Family Medicine | Admitting: Physical Therapy

## 2013-12-06 DIAGNOSIS — Z5189 Encounter for other specified aftercare: Secondary | ICD-10-CM | POA: Diagnosis present

## 2013-12-06 DIAGNOSIS — I951 Orthostatic hypotension: Secondary | ICD-10-CM | POA: Insufficient documentation

## 2013-12-06 DIAGNOSIS — G20 Parkinson's disease: Secondary | ICD-10-CM | POA: Diagnosis not present

## 2013-12-06 DIAGNOSIS — E1140 Type 2 diabetes mellitus with diabetic neuropathy, unspecified: Secondary | ICD-10-CM | POA: Insufficient documentation

## 2013-12-06 DIAGNOSIS — M25551 Pain in right hip: Secondary | ICD-10-CM | POA: Diagnosis not present

## 2013-12-09 ENCOUNTER — Encounter: Payer: Self-pay | Admitting: *Deleted

## 2013-12-09 ENCOUNTER — Other Ambulatory Visit: Payer: Self-pay | Admitting: *Deleted

## 2013-12-09 ENCOUNTER — Ambulatory Visit (INDEPENDENT_AMBULATORY_CARE_PROVIDER_SITE_OTHER): Payer: BC Managed Care – PPO | Admitting: Internal Medicine

## 2013-12-09 VITALS — Ht 67.5 in | Wt 159.0 lb

## 2013-12-09 DIAGNOSIS — Z79899 Other long term (current) drug therapy: Secondary | ICD-10-CM

## 2013-12-09 LAB — CBC
HEMATOCRIT: 41.7 % (ref 36.0–46.0)
Hemoglobin: 14 g/dL (ref 12.0–15.0)
MCHC: 33.6 g/dL (ref 30.0–36.0)
MCV: 95.1 fl (ref 78.0–100.0)
PLATELETS: 285 10*3/uL (ref 150.0–400.0)
RBC: 4.38 Mil/uL (ref 3.87–5.11)
RDW: 12.1 % (ref 11.5–15.5)
WBC: 6.7 10*3/uL (ref 4.0–10.5)

## 2013-12-09 NOTE — Patient Instructions (Signed)
Your physician recommends that you return for lab work today (CBC) Your physician recommends that you continue on your current medications as directed. Please refer to the Current Medication list given to you today. Your physician wants you to follow-up in: June 2016 with Dr. Harrington Challenger.  You will receive a reminder letter in the mail two months in advance. If you don't receive a letter, please call our office to schedule the follow-up appointment.

## 2013-12-09 NOTE — Progress Notes (Signed)
HPI Patient is a 53 yo with a history of drug induced Parkinson's and autonomic dysfunction.   She has been followed by Drs. John and Dohrmeir. Since she says she has done fairly well in regards to dizziness.  Heart is not racing as much on pindolol  Allergies  Allergen Reactions  . Nsaids Other (See Comments)    Irritates stomach  . Sulfonamide Derivatives Rash    Current Outpatient Prescriptions  Medication Sig Dispense Refill  . atorvastatin (LIPITOR) 40 MG tablet TAKE 1 TABLET (40 MG TOTAL) BY MOUTH DAILY.  90 tablet  3  . buPROPion (WELLBUTRIN) 100 MG tablet Take 100 mg by mouth 2 (two) times daily.      . clonazePAM (KLONOPIN) 1 MG tablet Take 1 mg by mouth 2 (two) times daily as needed for anxiety.      . Diclofenac Sodium 2 % SOLN Apply twice daily.  112 g  1  . glucose blood test strip 1 each by Other route. 7 times per day   - as instructed      . Lurasidone HCl (LATUDA) 20 MG TABS Take 10 mg by mouth.      . methylphenidate (RITALIN) 20 MG tablet Take 20 mg by mouth 3 (three) times daily between meals.      [name] NOVOLOG 100 UNIT/ML injection Insulin pump      . pindolol (VISKEN) 5 MG tablet Take 1 tablet (5 mg total) by mouth 2 (two) times daily.  180 tablet  3  . VIIBRYD 40 MG TABS TAKE 1 TABLET EVERY DAY  90 tablet  1  . zolpidem (AMBIEN) 10 MG tablet TAKE 1 TABLET BY MOUTH AT BEDTIME  30 tablet  5   No current facility-administered medications for this visit.    Past Medical History  Diagnosis Date  . Rotator cuff syndrome of left shoulder   . S/P partial hysterectomy   . Addiction, opium   . Therapy     on therapy by psychiatrist using sebutex   . 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 IBS (irritable bowel syndrome) 02/15/2011  . Bipolar disorder 02/15/2011  . Anxiety     ADHD CHRONIC ANXIETY  . Depression   . Diabetes mellitus     sees Dr Altheimer/endo  . Thyroid nodule     s/p biopsy - benign approx jan 2012  . Insomnia  02/21/2011  . Subacute dyskinesia due to drugs(333.85)     dr dohmeier/neuro  . Resting tremor   . Cervical spondylosis without myelopathy   . Orthostatic hypotension   . Allergy     spring  . Hyperlipidemia   . Dysautonomia 10/24/2011  . Tremor   . Parkinsonism   . 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     opium  . Orthostatic hypotension   . Dyskinesia      subacute,due to drugs  . Spondylosis without myelopathy   . POTS (postural orthostatic tachycardia syndrome)   . Tachycardia     syndrome  . Dyslipidemia   . Neuropathy     with paresthesias in feet  . Radiculopathy     cervical  . Multinodular thyroid     with large dominant solid nodule with calcifications in the lower left pole benign by FNA in 1/12 and stable by repeat ulltrasound in 4/13  . Depression   . 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 Oophorectomy    .  Herniated disc cervical spine    . Breast lumpectomy Right   . Spine surgery  feb 2012    c5-6 fusion, Dr Rolena Infante  . Lower back  1992  . Thyroid lobectomy  11/11    nodule biopsy, benign  . Partial hysterectomy    . Acromioplasty Left 2002    Dr Gladstone Lighter    Family History  Problem Relation Age of Onset  . Lung cancer Mother   . Heart disease Father   . Hypertension Father   . Heart disease Paternal Grandmother   . ALS Paternal Grandmother   . Hypertension Sister     History   Social History  . Marital Status: Single    Spouse Name: N/A    Number of Children: 0  . Years of Education: BA   Occupational History  . [employer]   Social History Main Topics  . Smoking status: Never Smoker   . Smokeless tobacco: Never Used  . Alcohol Use: No  . Drug Use: No  . Sexual Activity: No   Other Topics Concern  . Not on file   Social History Narrative   Patient is single and lives with my partner ([place]).   Patient is disabled.   Patient is right-handed.   Patient has a  Haematologist.   Patient drinks one soda every other day.    Review of Systems:  All systems reviewed.  They are negative to the above problem except as previously stated.  Vital Signs:  Lying:  118/64  P 79;  Sitting:  122/66  P 85  Standing:  118/72  P 88  Standing 122/80  P 89   Physical Exam BP HEENT:  Normocephalic, atraumatic. EOMI, PERRLA.  Neck: JVP is normal.  No bruits.  Lungs: clear to auscultation. No rales no wheezes.  Heart: Regular rate and rhythm. Normal S1, S2. No S3.   No significant murmurs. PMI not displaced.  Abdomen:  Supple, nontender. Normal bowel sounds. No masses. No hepatomegaly.  Extremities:   Good distal pulses throughout. No lower extremity edema.  Musculoskeletal :moving all extremities.  Neuro  Deferred   Assessment and Plan:  1.  Dysautonomia.  She has done fairly well since started on pindolol  COntinue Stay hydrated.

## 2013-12-20 ENCOUNTER — Ambulatory Visit (INDEPENDENT_AMBULATORY_CARE_PROVIDER_SITE_OTHER): Payer: BC Managed Care – PPO | Admitting: Family Medicine

## 2013-12-20 VITALS — BP 114/72 | HR 89 | Ht 67.0 in | Wt 159.0 lb

## 2013-12-20 DIAGNOSIS — M51369 Other intervertebral disc degeneration, lumbar region without mention of lumbar back pain or lower extremity pain: Secondary | ICD-10-CM | POA: Insufficient documentation

## 2013-12-20 DIAGNOSIS — M5136 Other intervertebral disc degeneration, lumbar region: Secondary | ICD-10-CM

## 2013-12-20 DIAGNOSIS — M25521 Pain in right elbow: Secondary | ICD-10-CM

## 2013-12-20 MED ORDER — IBUPROFEN-FAMOTIDINE 800-26.6 MG PO TABS: 1.0000 | ORAL_TABLET | Freq: Three times a day (TID) | ORAL | Status: DC

## 2013-12-20 NOTE — Assessment & Plan Note (Signed)
Patient's pain seems to be resolved at this time.

## 2013-12-20 NOTE — Assessment & Plan Note (Signed)
Patient overall is doing remarkably well with conservative therapy at this time. Encourage patient to do regular activities. Patient is doing much better on some of her depressive medications at this time. Patient has been given exercises prescription to try to increase her activity slowly. Lungs patient continues to do well she can follow-up on an as-needed basis.

## 2013-12-20 NOTE — Patient Instructions (Signed)
You are amazing!!!! Call the pharmacy and lets try the new medicine Duexis, can take up to 3 times daily If it does not help call me and I will give you a medicine to protect your stomach while you take aleve Keep up with the exercises and consider going to physical therapy for at least 1 visit.  Continue the vitamins See me again when you need me.

## 2013-12-20 NOTE — Progress Notes (Signed)
[name] [address], [place] [address] Phone: [phone] Subjective:    CC: Right hip pain follow up  [id number] Lynn Barnes is a 53 y.o. female coming in with complaint of right hip pain followup.patient was having pain in it seems the patient was having more of a radicular symptoms in the lower back. Patient did have a CT scan. Patient's CT scan in October 2015 shows mild spinal stenosis at multiple levels as well as degenerative disc disease at multiple levels. Hemangioma noted at L5 vertebrae. Patient does states in the interim been as long as she takes over-the-counter anti-inflammatories including 880 mg of Aleve for activity she seems to do well. Patient denies any stomach pain. Patient states that the back overall is doing well on hip has improved.  Patient also states that she has had trouble with the elbow and at the lateral epicondylitis and states that the pain is avulsed completely resolved at this time. Patient did not do physical therapy. Is doing some of the topical anti-inflammatories as well as icing. Patient denies any radiation of pain or any weakness.   Past medical history significant for an anterior C5-C6 fusion secondary to osteophytic changes. We have x-rayed patient back at that did show facet arthropathy at L5-S1 previously.   Past medical history, social, surgical and family history all reviewed in electronic medical record.   Review of Systems: No headache, visual changes, nausea, vomiting, diarrhea, constipation, dizziness, abdominal pain, skin rash, fevers, chills, night sweats, weight loss, swollen lymph nodes, body aches, joint swelling, muscle aches, chest pain, shortness of breath, mood changes.   Objective Blood pressure 114/72, pulse 89, height 5\' 7"  (1.702 m), weight 159 lb (72.122 kg), SpO2 94 %.  General: No apparent distress alert and oriented x3 mood and affect normal, dressed appropriately. Mild  feces noted HEENT: Pupils equal, extraocular movements intact  Respiratory: Patient's speak in full sentences and does not appear short of breath  Cardiovascular: No lower extremity edema, non tender, no erythema  Skin: Warm dry intact with no signs of infection or rash on extremities or on axial skeleton.  Abdomen: Soft nontender  Neuro: Cranial nerves II through XII are intact, neurovascularly intact in all extremities with 2+ DTRs and 2+ pulses.  Lymph: No lymphadenopathy of posterior or anterior cervical chain or axillae bilaterally.  Gait normal with good balance and coordination.  MSK:  Non tender with full range of motion and good stability and symmetric strength and tone of shoulders, , wrist, knee and ankles bilaterally. Patient does have a resting tremor of the left arm Hip: Right ROM IR: 35 Deg, ER: 45 Deg, Flexion: 120 Deg, Extension: 100 Deg, Abduction: 45 Deg, Adduction: 45 Deg Strength IR: 5/5, ER: 5/5, Flexion: 5/5, Extension: 5/5, Abduction: 4/5, Adduction: 5/5 Pelvic alignment unremarkable to inspection and palpation. Standing hip rotation and gait without trendelenburg sign / unsteadiness. Greater trochanteric area is nontender on exam Negative straight leg test No tenderness over piriformis. Negative Faber neg fadir No SI joint tenderness and normal minimal SI movement. Contralateral hip unremarkable Back exam is unremarkable  Elbow:right Unremarkable to inspection. Range of motion full pronation, supination, flexion, extension. Strength is full to all of the above directions Stable to varus, valgus stress. Negative moving valgus stress test. No discrete areas of tenderness to palpation. Ulnar nerve does not sublux. Negative cubital tunnel Tinel's.      Impression and Recommendations:     This case required medical  decision making of moderate complexity.

## 2013-12-21 ENCOUNTER — Ambulatory Visit: Payer: BC Managed Care – PPO

## 2013-12-26 ENCOUNTER — Encounter: Payer: BC Managed Care – PPO | Admitting: Physical Therapy

## 2013-12-28 ENCOUNTER — Ambulatory Visit (INDEPENDENT_AMBULATORY_CARE_PROVIDER_SITE_OTHER): Payer: BC Managed Care – PPO | Admitting: Adult Health

## 2013-12-28 ENCOUNTER — Encounter: Payer: Self-pay | Admitting: Adult Health

## 2013-12-28 VITALS — BP 115/73 | HR 92 | Temp 98.8°F | Resp 16 | Ht 67.0 in | Wt 160.0 lb

## 2013-12-28 DIAGNOSIS — G4700 Insomnia, unspecified: Secondary | ICD-10-CM

## 2013-12-28 DIAGNOSIS — E1143 Type 2 diabetes mellitus with diabetic autonomic (poly)neuropathy: Secondary | ICD-10-CM

## 2013-12-28 DIAGNOSIS — G20 Parkinson's disease: Secondary | ICD-10-CM

## 2013-12-28 NOTE — Progress Notes (Signed)
PATIENT: Lynn Barnes DOB: 1960-09-27  REASON FOR VISIT: follow up HISTORY FROM: patient  HISTORY OF PRESENT ILLNESS: Lynn Barnes is a 53 year old female with a history of neuroleptic-induces parkinsonism, POTS and diabetic autonomic neuropathy. She returns today for follow-up. She is currenting taking Latuda and wellbutrin and tolerating it well. She feels that it has helped with the depression. She is no longer on viibyrd. She has a mild tremor in both hands.  She continues to take Risperdal. Denies any trouble with her gait or balance. She fell last week due to orthostatic hypotension. She continues to take Ambien but it is no longer beneficial. She states that it takes a while for her to fall asleep but once asleep she does not wake up. It can take her 2-3 hours to fall asleep. The patient's psychiatrist is aware that the Ambien is no longer beneficial. The patient states that her psychiatrist did not want to change the medication this time.  HISTORY 06/27/13: 53 year old female with a history of Neuroleptic-induced Parkinsonism, postural orthostatic tachycardia syndrome and diabetic autonomic neuropathy. She returns today for follow-up. The patient is currently being seen by psychiatry and is on Risperdal. In the past she has tried to wean off this medication but was not successful. She is currently on viibyrd and is tolerating it well. She reports that this has  helped her depression and is better tolerated. Since her last visit she has remained stable. She continues to have a mild tremor in both hands. Denies having any falls. Denies trouble swallowing. Patient continues to have near syncopal episodes but it is better with the pindolol. She is having trouble sleeping. States that it can take up to 3 hours for her to fall sleep. Then she will wake up frequently. She is unsure if the Ambien is helping. Patient does watch TV in bed. Feels like she is watching the clock all night. Denies daytime  sleepiness. No new medical issues since the last visit.   REVIEW OF SYSTEMS: Out of a complete 14 system review of symptoms, the patient complains only of the following symptoms, and all other reviewed systems are negative.  Unexpected weight change Shortness of breath Chest tightness Palpitation Restless leg, insomnia, snoring Joint pain Depression, nervous/anxious Memory loss, weakness, tremors, passing out   ALLERGIES: Allergies  Allergen Reactions  . Nsaids Other (See Comments)    Irritates stomach  . Sulfonamide Derivatives Rash    HOME MEDICATIONS: Outpatient Prescriptions Prior to Visit  Medication Sig Dispense Refill  . atorvastatin (LIPITOR) 40 MG tablet TAKE 1 TABLET (40 MG TOTAL) BY MOUTH DAILY. 90 tablet 3  . clonazePAM (KLONOPIN) 1 MG tablet Take 1 mg by mouth 2 (two) times daily as needed for anxiety.    . Diclofenac Sodium 2 % SOLN Apply twice daily. 112 g 1  . Ibuprofen-Famotidine 800-26.6 MG TABS Take 1 tablet by mouth 3 (three) times daily. 90 tablet 3  . NOVOLOG 100 UNIT/ML injection Insulin pump    . pindolol (VISKEN) 5 MG tablet Take 1 tablet (5 mg total) by mouth 2 (two) times daily. 180 tablet 3  . VIIBRYD 40 MG TABS TAKE 1 TABLET EVERY DAY 90 tablet 1  . zolpidem (AMBIEN) 10 MG tablet TAKE 1 TABLET BY MOUTH AT BEDTIME 30 tablet 5  . glucose blood test strip 1 each by Other route. 7 times per day   - as instructed    . buPROPion (WELLBUTRIN) 100 MG tablet Take  100 mg by mouth 2 (two) times daily.    . Lurasidone HCl (LATUDA) 20 MG TABS Take 10 mg by mouth.    . methylphenidate (RITALIN) 20 MG tablet Take 20 mg by mouth 3 (three) times daily between meals.     No facility-administered medications prior to visit.    PAST MEDICAL HISTORY: Past Medical History  Diagnosis Date  . Rotator cuff syndrome of left shoulder   . S/P partial hysterectomy   . Addiction, opium   . Therapy     on therapy by psychiatrist using sebutex   . Parkinsonism     due  to prolonged prozac/risperdl for yrs/Dr Dohmeir  . IBS (irritable bowel syndrome) 02/15/2011  . Bipolar disorder 02/15/2011  . Anxiety     ADHD CHRONIC ANXIETY  . Depression   . Diabetes mellitus     sees Dr Altheimer/endo  . Thyroid nodule     s/p biopsy - benign approx jan 2012  . Insomnia 02/21/2011  . Subacute dyskinesia due to drugs(333.85)     dr dohmeier/neuro  . Resting tremor   . Cervical spondylosis without myelopathy   . Orthostatic hypotension   . Allergy     spring  . Hyperlipidemia   . Dysautonomia 10/24/2011  . Tremor   . Parkinsonism   . 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     opium  . Orthostatic hypotension   . Dyskinesia      subacute,due to drugs  . Spondylosis without myelopathy   . POTS (postural orthostatic tachycardia syndrome)   . Tachycardia     syndrome  . Dyslipidemia   . Neuropathy     with paresthesias in feet  . Radiculopathy     cervical  . Multinodular thyroid     with large dominant solid nodule with calcifications in the lower left pole benign by FNA in 1/12 and stable by repeat ulltrasound in 4/13  . Depression   . 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 Oophorectomy    . Herniated disc cervical spine    . Breast lumpectomy Right   . Spine surgery  feb 2012    c5-6 fusion, Dr Brooks  . Lower back  1992  . Thyroid lobectomy  11/11    nodule biopsy, benign  . Partial hysterectomy    . Acromioplasty Left 2002    Dr Gioffre    FAMILY HISTORY: Family History  Problem Relation Age of Onset  . Lung cancer Mother   . Heart disease Father   . Hypertension Father   . Heart disease Paternal Grandmother   . ALS Paternal Grandmother   . Hypertension Sister     SOCIAL HISTORY: History   Social History  . Marital Status: Single    Spouse Name: N/A    Number of Children: 0  . Years of Education: BA   Occupational History  . TEACHER [employer]   Social  History Main Topics  . Smoking status: Never Smoker   . Smokeless tobacco: Never Used  . Alcohol Use: No  . Drug Use: No  . Sexual Activity: No   Other Topics Concern  . Not on file   Social History Narrative   Patient is single and lives with my partner (Annette Holern).   Patient is disabled.   Patient is right-handed.   Patient has a Bachelors degree.   Patient drinks one soda every other day.        PHYSICAL EXAM  Filed Vitals:   12/28/13 1531  BP: 115/73  Pulse: 92  Temp: 98.8 F (37.1 C)  TempSrc: Oral  Resp: 16  Height: 5' 7" (1.702 m)  Weight: 160 lb (72.576 kg)   Body mass index is 25.05 kg/(m^2).  Generalized: Well developed, in no acute distress   Neurological examination  Mentation: Alert oriented to time, place, history taking. Follows all commands speech and language fluent Cranial nerve II-XII: Pupils were equal round reactive to light. Extraocular movements were full, visual field were full on confrontational test. Facial sensation and strength were normal. Uvula tongue midline. Head turning and shoulder shrug  were normal and symmetric. Motor: The motor testing reveals 5 over 5 strength of all 4 extremities. Good symmetric motor tone is noted throughout.  Sensory: Sensory testing is intact to soft touch on all 4 extremities. No evidence of extinction is noted.  Coordination: Cerebellar testing reveals good finger-nose-finger and heel-to-shin bilaterally.  Gait and station: Gait is normal. Tandem gait is normal. Romberg is negative. No drift is seen.  Reflexes: Deep tendon reflexes are symmetric and normal bilaterally.     DIAGNOSTIC DATA (LABS, IMAGING, TESTING) - I reviewed patient records, labs, notes, testing and imaging myself where available.  Lab Results  Component Value Date   WBC 6.7 12/09/2013   HGB 14.0 12/09/2013   HCT 41.7 12/09/2013   MCV 95.1 12/09/2013   PLT 285.0 12/09/2013      Component Value Date/Time   NA 140 06/03/2013  1506   K 4.1 06/03/2013 1506   CL 101 06/03/2013 1506   CO2 26 06/03/2013 1506   GLUCOSE 168* 06/03/2013 1506   BUN 24* 06/03/2013 1506   CREATININE 1.3* 06/03/2013 1506   CALCIUM 9.5 06/03/2013 1506   AST 17 11/26/2012 0915   GFRNONAA >60 03/22/2010 1251   GFRAA  03/22/2010 1251    >60        The eGFR has been calculated using the MDRD equation. This calculation has not been validated in all clinical situations. eGFR's persistently <60 mL/min signify possible Chronic Kidney Disease.   Lab Results  Component Value Date   CHOL 161 11/26/2012   HDL 52.00 11/26/2012   LDLCALC 90 11/26/2012   TRIG 95.0 11/26/2012   CHOLHDL 3 11/26/2012   Lab Results  Component Value Date   HGBA1C 8.3* 03/22/2012   No results found for: VITAMINB12 Lab Results  Component Value Date   TSH 1.31 03/22/2012      ASSESSMENT AND PLAN 53 y.o. year old female  has a past medical history of Rotator cuff syndrome of left shoulder; S/P partial hysterectomy; Addiction, opium; Therapy; Parkinsonism; IBS (irritable bowel syndrome) (02/15/2011); Bipolar disorder (02/15/2011); Anxiety; Depression; Diabetes mellitus; Thyroid nodule; Insomnia (02/21/2011); Subacute dyskinesia due to drugs(333.85); Resting tremor; Cervical spondylosis without myelopathy; Orthostatic hypotension; Allergy; Hyperlipidemia; Dysautonomia (10/24/2011); Tremor; Parkinsonism; 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; Orthostatic hypotension; Dyskinesia; Spondylosis without myelopathy; POTS (postural orthostatic tachycardia syndrome); Tachycardia; Dyslipidemia; Neuropathy; Radiculopathy; Multinodular thyroid; Depression; and 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 here with:  1. Secondary parkinsonism 2.diabetic autonomic neuropathy 3. insomnia  Overall the patient has remained the same. She continues to have a mild tremor in both hands. She continues to take Risperdal. The patient continues to suffer from insomnia. She is currently taking Ambien prescribed by her  psychiatrist. I have advised the patient to try taking melatonin 3 mg with her Ambien. She does not find this beneficial for her insomnia. In the future she may try Belsomra.   She states that she will also talk to her psychiatrist about this medication as well. If  the patient's symptoms worsen or she develops new symptoms she should let us know. Otherwise she will follow up in 6 months or sooner if needed.    , MSN, NP-C 12/28/2013, 3:46 PM [facility] [address], [address] [place], [place] [address] [phone]  Note: This document was prepared with digital dictation and possible smart phrase technology. Any transcriptional errors that result from this process are unintentional.  

## 2013-12-28 NOTE — Progress Notes (Signed)
I agree with the assessment and plan as directed by NP .The patient is known to me .   Justine Dines, MD  

## 2014-01-26 ENCOUNTER — Encounter: Payer: Self-pay | Admitting: Internal Medicine

## 2014-01-26 ENCOUNTER — Ambulatory Visit (INDEPENDENT_AMBULATORY_CARE_PROVIDER_SITE_OTHER): Payer: BC Managed Care – PPO | Admitting: Internal Medicine

## 2014-01-26 VITALS — BP 122/80 | HR 86 | Temp 98.1°F | Ht 67.0 in | Wt 166.1 lb

## 2014-01-26 DIAGNOSIS — F331 Major depressive disorder, recurrent, moderate: Secondary | ICD-10-CM

## 2014-01-26 DIAGNOSIS — J029 Acute pharyngitis, unspecified: Secondary | ICD-10-CM | POA: Insufficient documentation

## 2014-01-26 DIAGNOSIS — E1042 Type 1 diabetes mellitus with diabetic polyneuropathy: Secondary | ICD-10-CM

## 2014-01-26 MED ORDER — AZITHROMYCIN 250 MG PO TABS: ORAL_TABLET | ORAL | Status: DC

## 2014-01-26 NOTE — Patient Instructions (Signed)
Please take all new medication as prescribed  Please continue all other medications as before, and refills have been done if requested.  Please have the pharmacy call with any other refills you may need.  Please keep your appointments with your specialists as you may have planned     

## 2014-01-26 NOTE — Progress Notes (Signed)
Pre visit review using our clinic review tool, if applicable. No additional management support is needed unless otherwise documented below in the visit note. 

## 2014-01-26 NOTE — Progress Notes (Signed)
Subjective:    Patient ID: Lynn Barnes, female    DOB: July 24, 1960, 53 y.o.   MRN: [id number]  HPI   . Here with 2-3 days acute onset fever, severe ST, headache, general weakness and malaise, and greenish d/c, non prod cough, but pt denies chest pain, wheezing, increased sob or doe, orthopnea, PND, increased LE swelling, palpitations, dizziness or syncope. Pt denies new neurological symptoms such as new headache, or facial or extremity weakness or numbness   Pt denies polydipsia, polyuria, Denies worsening depressive symptoms, suicidal ideation, or panic Past Medical History  Diagnosis Date  . Rotator cuff syndrome of left shoulder   . S/P partial hysterectomy   . Addiction, opium   . Therapy     on therapy by psychiatrist using sebutex   . 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 IBS (irritable bowel syndrome) 02/15/2011  . Bipolar disorder 02/15/2011  . Anxiety     ADHD CHRONIC ANXIETY  . Depression   . Diabetes mellitus     sees Dr Altheimer/endo  . Thyroid nodule     s/p biopsy - benign approx jan 2012  . Insomnia 02/21/2011  . Subacute dyskinesia due to drugs(333.85)     dr dohmeier/neuro  . Resting tremor   . Cervical spondylosis without myelopathy   . Orthostatic hypotension   . Allergy     spring  . Hyperlipidemia   . Dysautonomia 10/24/2011  . Tremor   . Parkinsonism   . 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     opium  . Orthostatic hypotension   . Dyskinesia      subacute,due to drugs  . Spondylosis without myelopathy   . POTS (postural orthostatic tachycardia syndrome)   . Tachycardia     syndrome  . Dyslipidemia   . Neuropathy     with paresthesias in feet  . Radiculopathy     cervical  . Multinodular thyroid     with large dominant solid nodule with calcifications in the lower left pole benign by FNA in 1/12 and stable by repeat ulltrasound in 4/13  . Depression   . 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 Oophorectomy    . Herniated disc cervical spine    . Breast lumpectomy Right   . Spine surgery  feb 2012    c5-6 fusion, Dr Rolena Infante  . Lower back  1992  . Thyroid lobectomy  11/11    nodule biopsy, benign  . Partial hysterectomy    . Acromioplasty Left 2002    Dr Gladstone Lighter    reports that she has never smoked. She has never used smokeless tobacco. She reports that she does not drink alcohol or use illicit drugs. family history includes ALS in her paternal grandmother; Heart disease in her father and paternal grandmother; Hypertension in her father and sister; Lung cancer in her mother. Allergies  Allergen Reactions  . Nsaids Other (See Comments)    Irritates stomach  . Sulfonamide Derivatives Rash   Current Outpatient Prescriptions on File Prior to Visit  Medication Sig Dispense Refill  . atorvastatin (LIPITOR) 40 MG tablet TAKE 1 TABLET (40 MG TOTAL) BY MOUTH DAILY. 90 tablet 3  . buPROPion (WELLBUTRIN XL) 300 MG 24 hr tablet Take 300 mg by mouth daily.  1  . clonazePAM (KLONOPIN) 1 MG tablet Take 1 mg by mouth 2 (two) times daily as needed for anxiety.    [name]  glucose blood test strip 1 each by Other route. 7 times per day   - as instructed    . Ibuprofen-Famotidine 800-26.6 MG TABS Take 1 tablet by mouth 3 (three) times daily. 90 tablet 3  . methylphenidate (RITALIN) 10 MG tablet     . NOVOLOG 100 UNIT/ML injection Insulin pump    . pindolol (VISKEN) 5 MG tablet Take 1 tablet (5 mg total) by mouth 2 (two) times daily. 180 tablet 3  . risperiDONE (RISPERDAL) 3 MG tablet Take 6 mg by mouth at bedtime.  1  . zolpidem (AMBIEN) 10 MG tablet TAKE 1 TABLET BY MOUTH AT BEDTIME 30 tablet 5   No current facility-administered medications on file prior to visit.   Review of Systems  Constitutional: Negative for unusual diaphoresis or other sweats  HENT: Negative for ringing in ear Eyes: Negative for double vision or worsening visual disturbance.  Respiratory: Negative for  choking and stridor.   Gastrointestinal: Negative for vomiting or other signifcant bowel change Genitourinary: Negative for hematuria or decreased urine volume.  Musculoskeletal: Negative for other MSK pain or swelling Skin: Negative for color change and worsening wound.  Neurological: Negative for tremors and numbness other than noted  Psychiatric/Behavioral: Negative for decreased concentration or agitation other than above       Objective:   Physical Exam BP 122/80 mmHg  Pulse 86  Temp(Src) 98.1 F (36.7 C) (Oral)  Ht 5\' 7"  (1.702 m)  Wt 166 lb 2 oz (75.354 kg)  BMI 26.01 kg/m2  SpO2 98% VS noted, mild ill Constitutional: Pt appears well-developed, well-nourished.  HENT: Head: NCAT.  Right Ear: External ear normal.  Left Ear: External ear normal.  Eyes: . Pupils are equal, round, and reactive to light. Conjunctivae and EOM are normal Bilat tm's with mild erythema.  Max sinus areas non tender.  Pharynx with severe erythema/swelling, 1+ exudate Neck: Normal range of motion. Neck supple.  Cardiovascular: Normal rate and regular rhythm.   Pulmonary/Chest: Effort normal and breath sounds normal.  Neurological: Pt is alert. Not confused , motor grossly intact Skin: Skin is warm. No rash Psychiatric: Pt behavior is normal. No agitation. not depressed affect    Assessment & Plan:

## 2014-01-28 NOTE — Assessment & Plan Note (Signed)
stable overall by history and exam, recent data reviewed with pt, and pt to continue medical treatment as before,  to f/u any worsening symptoms or concerns Lab Results  Component Value Date   WBC 6.7 12/09/2013   HGB 14.0 12/09/2013   HCT 41.7 12/09/2013   PLT 285.0 12/09/2013   GLUCOSE 168* 06/03/2013   CHOL 161 11/26/2012   TRIG 95.0 11/26/2012   HDL 52.00 11/26/2012   LDLCALC 90 11/26/2012   AST 17 11/26/2012   NA 140 06/03/2013   K 4.1 06/03/2013   CL 101 06/03/2013   CREATININE 1.3* 06/03/2013   BUN 24* 06/03/2013   CO2 26 06/03/2013   TSH 1.31 03/22/2012   HGBA1C 8.3* 03/22/2012

## 2014-01-28 NOTE — Assessment & Plan Note (Signed)
Mild to mod, for antibx course,  to f/u any worsening symptoms or concerns 

## 2014-01-28 NOTE — Assessment & Plan Note (Signed)
stable overall by history and exam, recent data reviewed with pt, and pt to continue medical treatment as before,  to f/u any worsening symptoms or concerns Lab Results  Component Value Date   HGBA1C 8.3* 03/22/2012

## 2014-02-15 ENCOUNTER — Telehealth: Payer: Self-pay | Admitting: Internal Medicine

## 2014-02-15 MED ORDER — AZITHROMYCIN 250 MG PO TABS: ORAL_TABLET | ORAL | Status: DC

## 2014-02-15 NOTE — Telephone Encounter (Signed)
Patient is requesting another Zpack RX. States that she is sick again from the original visit.

## 2014-02-15 NOTE — Telephone Encounter (Signed)
Patient informed script sent in.

## 2014-02-15 NOTE — Telephone Encounter (Signed)
Refill done.  

## 2014-05-02 ENCOUNTER — Other Ambulatory Visit: Payer: Self-pay | Admitting: Neurology

## 2014-05-02 NOTE — Telephone Encounter (Signed)
Last OV note says: She is currenting taking Latuda and wellbutrin and tolerating it well. She feels that it has helped with the depression. She is no longer on viibyrd

## 2014-05-10 ENCOUNTER — Other Ambulatory Visit: Payer: Self-pay | Admitting: Neurology

## 2014-06-27 ENCOUNTER — Other Ambulatory Visit: Payer: Self-pay | Admitting: Internal Medicine

## 2014-07-25 ENCOUNTER — Telehealth: Payer: Self-pay

## 2014-07-25 NOTE — Telephone Encounter (Signed)
Pt does not want to get recheck

## 2014-08-01 ENCOUNTER — Other Ambulatory Visit: Payer: Self-pay | Admitting: Internal Medicine

## 2014-08-01 ENCOUNTER — Other Ambulatory Visit: Payer: Self-pay | Admitting: Neurology

## 2014-08-02 NOTE — Telephone Encounter (Signed)
Last OV note says: She is no longer on viibyrd I called and spoke with the patient who verified she is not taking this medication.  She is unsure why the pharmacy requested refill.

## 2014-08-07 ENCOUNTER — Other Ambulatory Visit: Payer: Self-pay

## 2014-08-17 ENCOUNTER — Encounter: Payer: Self-pay | Admitting: Internal Medicine

## 2014-08-17 ENCOUNTER — Ambulatory Visit (INDEPENDENT_AMBULATORY_CARE_PROVIDER_SITE_OTHER): Payer: BC Managed Care – PPO | Admitting: Internal Medicine

## 2014-08-17 ENCOUNTER — Other Ambulatory Visit (INDEPENDENT_AMBULATORY_CARE_PROVIDER_SITE_OTHER): Payer: BC Managed Care – PPO

## 2014-08-17 VITALS — BP 110/74 | HR 80 | Temp 98.4°F | Ht 67.0 in | Wt 174.0 lb

## 2014-08-17 DIAGNOSIS — R739 Hyperglycemia, unspecified: Secondary | ICD-10-CM | POA: Diagnosis not present

## 2014-08-17 DIAGNOSIS — Z0000 Encounter for general adult medical examination without abnormal findings: Secondary | ICD-10-CM

## 2014-08-17 DIAGNOSIS — R413 Other amnesia: Secondary | ICD-10-CM

## 2014-08-17 DIAGNOSIS — R1110 Vomiting, unspecified: Secondary | ICD-10-CM | POA: Diagnosis not present

## 2014-08-17 LAB — BASIC METABOLIC PANEL
BUN: 12 mg/dL (ref 6–23)
CO2: 30 mEq/L (ref 19–32)
Calcium: 9.2 mg/dL (ref 8.4–10.5)
Chloride: 103 mEq/L (ref 96–112)
Creatinine, Ser: 0.92 mg/dL (ref 0.40–1.20)
GFR: 67.4 mL/min (ref >60.00)
GLUCOSE: 197 mg/dL — AB (ref 70–99)
POTASSIUM: 4 meq/L (ref 3.5–5.1)
SODIUM: 138 meq/L (ref 135–145)

## 2014-08-17 LAB — CBC WITH DIFFERENTIAL/PLATELET
BASOS ABS: 0 10*3/uL (ref 0.0–0.1)
Basophils Relative: 0.9 % (ref 0.0–3.0)
Eosinophils Absolute: 0.1 10*3/uL (ref 0.0–0.7)
Eosinophils Relative: 1.4 % (ref 0.0–5.0)
HCT: 41.6 % (ref 36.0–46.0)
Hemoglobin: 14.2 g/dL (ref 12.0–15.0)
LYMPHS PCT: 47.2 % — AB (ref 12.0–46.0)
Lymphs Abs: 2.1 10*3/uL (ref 0.7–4.0)
MCHC: 34.1 g/dL (ref 30.0–36.0)
MCV: 94 fl (ref 78.0–100.0)
MONOS PCT: 8 % (ref 3.0–12.0)
Monocytes Absolute: 0.4 10*3/uL (ref 0.1–1.0)
Neutro Abs: 1.9 10*3/uL (ref 1.4–7.7)
Neutrophils Relative %: 42.5 % — ABNORMAL LOW (ref 43.0–77.0)
Platelets: 260 10*3/uL (ref 150.0–400.0)
RBC: 4.43 Mil/uL (ref 3.87–5.11)
RDW: 12.2 % (ref 11.5–15.5)
WBC: 4.4 10*3/uL (ref 4.0–10.5)

## 2014-08-17 LAB — URINALYSIS, ROUTINE W REFLEX MICROSCOPIC
Bilirubin Urine: NEGATIVE
Hgb urine dipstick: NEGATIVE
Ketones, ur: NEGATIVE
LEUKOCYTES UA: NEGATIVE
Nitrite: NEGATIVE
PH: 6.5 (ref 5.0–8.0)
Specific Gravity, Urine: 1.02 (ref 1.000–1.030)
Total Protein, Urine: NEGATIVE
UROBILINOGEN UA: 0.2 (ref 0.0–1.0)
Urine Glucose: >=1000 — AB

## 2014-08-17 LAB — VITAMIN B12: VITAMIN B 12: 944 pg/mL — AB (ref 211–911)

## 2014-08-17 LAB — LIPID PANEL
CHOL/HDL RATIO: 3
Cholesterol: 160 mg/dL (ref 0–200)
HDL: 54.8 mg/dL (ref >39.00)
LDL Cholesterol: 91 mg/dL (ref 0–99)
NonHDL: 105.2
Triglycerides: 69 mg/dL (ref 0.0–149.0)
VLDL: 13.8 mg/dL (ref 0.0–40.0)

## 2014-08-17 LAB — HEPATIC FUNCTION PANEL
ALK PHOS: 64 U/L (ref 39–117)
ALT: 16 U/L (ref 0–35)
AST: 16 U/L (ref 0–37)
Albumin: 3.8 g/dL (ref 3.5–5.2)
BILIRUBIN DIRECT: 0.1 mg/dL (ref 0.0–0.3)
TOTAL PROTEIN: 6.5 g/dL (ref 6.0–8.3)
Total Bilirubin: 0.4 mg/dL (ref 0.2–1.2)

## 2014-08-17 LAB — HEMOGLOBIN A1C: Hgb A1c MFr Bld: 9 % — ABNORMAL HIGH (ref 4.6–6.5)

## 2014-08-17 LAB — TSH: TSH: 2.09 u[IU]/mL (ref 0.35–4.50)

## 2014-08-17 NOTE — Patient Instructions (Signed)
Please continue all other medications as before, and refills have been done if requested.  Please have the pharmacy call with any other refills you may need.  Please continue your efforts at being more active, low cholesterol diet, and weight control.  You are otherwise up to date with prevention measures today.  Please keep your appointments with your specialists as you may have planned - Neurology July 11  You will be contacted regarding the referral for: Gastroenterology  Please go to the LAB in the Basement (turn left off the elevator) for the tests to be done today  You will be contacted by phone if any changes need to be made immediately.  Otherwise, you will receive a letter about your results with an explanation, but please check with MyChart first.  Please remember to sign up for MyChart if you have not done so, as this will be important to you in the future with finding out test results, communicating by private email, and scheduling acute appointments online when needed.  Please return in 6 months, or sooner if needed

## 2014-08-17 NOTE — Assessment & Plan Note (Signed)
To cont pepcid, for GI referral as well

## 2014-08-17 NOTE — Progress Notes (Signed)
Pre visit review using our clinic review tool, if applicable. No additional management support is needed unless otherwise documented below in the visit note. 

## 2014-08-17 NOTE — Assessment & Plan Note (Signed)
?   Early memory loss related to parkinsons; will hold on ordering imaging for now (sees neurology July 11) but for B12, rpr, f/u neuro

## 2014-08-17 NOTE — Assessment & Plan Note (Signed)

## 2014-08-17 NOTE — Progress Notes (Signed)
Subjective:    Patient ID: Lynn Barnes, female    DOB: 1960-11-29, 54 y.o.   MRN: [id number]  HPI  Here for wellness and f/u;  Overall doing ok;  Pt denies Chest pain, worsening SOB, DOE, wheezing, orthopnea, PND, worsening LE edema, palpitations, dizziness or syncope.  Pt denies neurological change such as new headache, facial or extremity weakness.  Pt denies polydipsia, polyuria, or low sugar symptoms. Pt states overall good compliance with treatment and medications, good tolerability, and has been trying to follow appropriate diet.  Pt denies worsening depressive symptoms, suicidal ideation or panic. No fever, night sweats, wt loss, loss of appetite, or other constitutional symptoms.  Pt states good ability with ADL's, has low fall risk, home safety reviewed and adequate, no other significant changes in hearing or vision, and only occasionally active with exercise. Denies worsening reflux, abd pain, dysphagia, n/v, bowel change or blood, but has freqeunt signifcant regurgitation, especially worse with any meal just after with leaning forward at the waist. Also now with worsening memory loss it seems to statements made by her significant other that were important and pt cannot remember any of this the next day.  Incidentally has appt with neurology July 11  Father with dementa  Past Medical History  Diagnosis Date  . Rotator cuff syndrome of left shoulder   . S/P partial hysterectomy   . Addiction, opium   . Therapy     on therapy by psychiatrist using sebutex   . 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 IBS (irritable bowel syndrome) 02/15/2011  . Bipolar disorder 02/15/2011  . Anxiety     ADHD CHRONIC ANXIETY  . Depression   . Diabetes mellitus     sees Dr Altheimer/endo  . Thyroid nodule     s/p biopsy - benign approx jan 2012  . Insomnia 02/21/2011  . Subacute dyskinesia due to drugs(333.85)     dr dohmeier/neuro  . Resting tremor   . Cervical  spondylosis without myelopathy   . Orthostatic hypotension   . Allergy     spring  . Hyperlipidemia   . Dysautonomia 10/24/2011  . Tremor   . Parkinsonism   . 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     opium  . Orthostatic hypotension   . Dyskinesia      subacute,due to drugs  . Spondylosis without myelopathy   . POTS (postural orthostatic tachycardia syndrome)   . Tachycardia     syndrome  . Dyslipidemia   . Neuropathy     with paresthesias in feet  . Radiculopathy     cervical  . Multinodular thyroid     with large dominant solid nodule with calcifications in the lower left pole benign by FNA in 1/12 and stable by repeat ulltrasound in 4/13  . Depression   . 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 Oophorectomy    . Herniated disc cervical spine    . Breast lumpectomy Right   . Spine surgery  feb 2012    c5-6 fusion, Dr Rolena Infante  . Lower back  1992  . Thyroid lobectomy  11/11    nodule biopsy, benign  . Partial hysterectomy    . Acromioplasty Left 2002    Dr Gladstone Lighter    reports that she has never smoked. She has never used smokeless tobacco. She reports that she does not drink alcohol or use illicit drugs. family  history includes ALS in her paternal grandmother; Heart disease in her father and paternal grandmother; Hypertension in her father and sister; Lung cancer in her mother. Allergies  Allergen Reactions  . Nsaids Other (See Comments)    Irritates stomach  . Sulfonamide Derivatives Rash   Current Outpatient Prescriptions on File Prior to Visit  Medication Sig Dispense Refill  . atorvastatin (LIPITOR) 40 MG tablet TAKE 1 TABLET BY MOUTH EVERY DAY 30 tablet 0  . buPROPion (WELLBUTRIN XL) 300 MG 24 hr tablet Take 300 mg by mouth daily.  1  . clonazePAM (KLONOPIN) 1 MG tablet Take 1 mg by mouth 2 (two) times daily as needed for anxiety.    [name] glucose blood test strip 1 each by Other route. 7 times per day   - as instructed    .  Ibuprofen-Famotidine 800-26.6 MG TABS Take 1 tablet by mouth 3 (three) times daily. 90 tablet 3  . NOVOLOG 100 UNIT/ML injection Insulin pump    . pindolol (VISKEN) 5 MG tablet Take 1 tablet (5 mg total) by mouth 2 (two) times daily. 180 tablet 3  . risperiDONE (RISPERDAL) 3 MG tablet Take 6 mg by mouth at bedtime.  1  . azithromycin (ZITHROMAX Z-PAK) 250 MG tablet Use as directed (Patient not taking: Reported on 08/17/2014) 6 tablet 1  . methylphenidate (RITALIN) 10 MG tablet     . zolpidem (AMBIEN) 10 MG tablet TAKE 1 TABLET BY MOUTH AT BEDTIME (Patient not taking: Reported on 08/17/2014) 30 tablet 5   No current facility-administered medications on file prior to visit.    Review of Systems Constitutional: Negative for increased diaphoresis, other activity, appetite or siginficant weight change other than noted HENT: Negative for worsening hearing loss, ear pain, facial swelling, mouth sores and neck stiffness.   Eyes: Negative for other worsening pain, redness or visual disturbance.  Respiratory: Negative for shortness of breath and wheezing  Cardiovascular: Negative for chest pain and palpitations.  Gastrointestinal: Negative for diarrhea, blood in stool, abdominal distention or other pain Genitourinary: Negative for hematuria, flank pain or change in urine volume.  Musculoskeletal: Negative for myalgias or other joint complaints.  Skin: Negative for color change and wound or drainage.  Neurological: Negative for syncope and numbness. other than noted Hematological: Negative for adenopathy. or other swelling Psychiatric/Behavioral: Negative for hallucinations, SI, self-injury, decreased concentration or other worsening agitation.      Objective:   Physical Exam BP 110/74 mmHg  Pulse 80  Temp(Src) 98.4 F (36.9 C) (Oral)  Ht 5\' 7"  (1.702 m)  Wt 174 lb (78.926 kg)  BMI 27.25 kg/m2  SpO2 95% VS noted,  Constitutional: Pt is oriented to person, place, and time. Appears well-developed  and well-nourished, in no significant distress Head: Normocephalic and atraumatic.  Right Ear: External ear normal.  Left Ear: External ear normal.  Nose: Nose normal.  Mouth/Throat: Oropharynx is clear and moist.  Eyes: Conjunctivae and EOM are normal. Pupils are equal, round, and reactive to light.  Neck: Normal range of motion. Neck supple. No JVD present. No tracheal deviation present or significant neck LA or mass Cardiovascular: Normal rate, regular rhythm, normal heart sounds and intact distal pulses.   Pulmonary/Chest: Effort normal and breath sounds without rales or wheezing  Abdominal: Soft. Bowel sounds are normal. NT. No HSM  Musculoskeletal: Normal range of motion. Exhibits no edema.  Lymphadenopathy:  Has no cervical adenopathy.  Neurological: Pt is alert and oriented to person, place, and time. Pt has normal reflexes.  No cranial nerve deficit. Motor grossly intact Skin: Skin is warm and dry. No rash noted.  Psychiatric:  Has mild nervous mood and affect. Behavior is normal.      Assessment & Plan:

## 2014-08-18 ENCOUNTER — Telehealth: Payer: Self-pay | Admitting: Internal Medicine

## 2014-08-18 NOTE — Telephone Encounter (Signed)
Patient stated that she received a call from you, please advise

## 2014-08-18 NOTE — Telephone Encounter (Signed)
I did not call this pt.

## 2014-08-21 ENCOUNTER — Encounter: Payer: Self-pay | Admitting: Adult Health

## 2014-08-21 ENCOUNTER — Ambulatory Visit (INDEPENDENT_AMBULATORY_CARE_PROVIDER_SITE_OTHER): Payer: BC Managed Care – PPO | Admitting: Adult Health

## 2014-08-21 VITALS — BP 115/79 | HR 78 | Ht 67.0 in | Wt 171.0 lb

## 2014-08-21 DIAGNOSIS — R413 Other amnesia: Secondary | ICD-10-CM | POA: Diagnosis not present

## 2014-08-21 DIAGNOSIS — G2111 Neuroleptic induced parkinsonism: Secondary | ICD-10-CM | POA: Diagnosis not present

## 2014-08-21 NOTE — Progress Notes (Signed)
I agree with the assessment and plan as directed by NP .The patient is known to me .   Nahomi Hegner, MD  

## 2014-08-21 NOTE — Progress Notes (Signed)
PATIENT: Lynn Barnes DOB: [birth year]  REASON FOR VISIT: follow up- neuroleptic-induced parkinsonism, POTS, diabetic autonomic neuropathy HISTORY FROM: patient  HISTORY OF PRESENT ILLNESS: Lynn Barnes is a 54 year old female with a history of neuroleptic-induced parkinsonism, POTS and diabetic autonomic neuropathy. She returns today for follow-up. Overall the patient states she's been doing well. She continues to have a mild tremor in both hands. Denies any significant changes with her gait or balance. Denies any falls. She denies any troubles swallowing. He continues to take Risperdal. She was briefly started on Geodon. She states that since starting Geodon she feels that her memory has been affected. She states that she can't remember things people told her a day ago. She is able to complete all ADLs independently. She operates a Teacher, music without difficulty. The patient uses Geodon and Risperdal to treat bipolar disorder and depression. She returns today for an evaluation.  HISTORY 12/28/13: Lynn Barnes is a 54 year old female with a history of neuroleptic-induces parkinsonism, POTS and diabetic autonomic neuropathy. She returns today for follow-up. She is currenting taking Latuda and wellbutrin and tolerating it well. She feels that it has helped with the depression. She is no longer on viibyrd. She has a mild tremor in both hands. She continues to take Risperdal. Denies any trouble with her gait or balance. She fell last week due to orthostatic hypotension. She continues to take Ambien but it is no longer beneficial. She states that it takes a while for her to fall asleep but once asleep she does not wake up. It can take her 2-3 hours to fall asleep. The patient's psychiatrist is aware that the Ambien is no longer beneficial. The patient states that her psychiatrist did not want to change the medication this time.  HISTORY 06/27/13: 54 year old female with a history of Neuroleptic-induced  Parkinsonism, postural orthostatic tachycardia syndrome and diabetic autonomic neuropathy. She returns today for follow-up. The patient is currently being seen by psychiatry and is on Risperdal. In the past she has tried to wean off this medication but was not successful. She is currently on viibyrd and is tolerating it well. She reports that this has helped her depression and is better tolerated. Since her last visit she has remained stable. She continues to have a mild tremor in both hands. Denies having any falls. Denies trouble swallowing. Patient continues to have near syncopal episodes but it is better with the pindolol. She is having trouble sleeping. States that it can take up to 3 hours for her to fall sleep. Then she will wake up frequently. She is unsure if the Ambien is helping. Patient does watch TV in bed. Feels like she is watching the clock all night. Denies daytime sleepiness. No new medical issues since the last visit.   REVIEW OF SYSTEMS: Out of a complete 14 system review of symptoms, the patient complains only of the following symptoms, and all other reviewed systems are negative.  Eye discharge, eye itching, runny nose, restless leg, insomnia, frequent waking, snoring, walking difficulty, neck pain, environmental allergies, and memory loss, tremors, passing out, agitation, confusion, decreased concentration, depression, nervous ALLERGIES: Allergies  Allergen Reactions  . Nsaids Other (See Comments)    Irritates stomach  . Sulfonamide Derivatives Rash    HOME MEDICATIONS: Outpatient Prescriptions Prior to Visit  Medication Sig Dispense Refill  . atorvastatin (LIPITOR) 40 MG tablet TAKE 1 TABLET BY MOUTH EVERY DAY 30 tablet 0  . BELSOMRA 15 MG TABS Take 1  tablet by mouth at bedtime.  1  . buPROPion (WELLBUTRIN XL) 300 MG 24 hr tablet Take 300 mg by mouth daily.  1  . clonazePAM (KLONOPIN) 1 MG tablet Take 1 mg by mouth 2 (two) times daily as needed for anxiety.    [name] glucose  blood test strip 1 each by Other route. 7 times per day   - as instructed    . Ibuprofen-Famotidine 800-26.6 MG TABS Take 1 tablet by mouth 3 (three) times daily. 90 tablet 3  . NOVOLOG 100 UNIT/ML injection Insulin pump    . pindolol (VISKEN) 5 MG tablet Take 1 tablet (5 mg total) by mouth 2 (two) times daily. 180 tablet 3  . risperiDONE (RISPERDAL) 3 MG tablet Take 6 mg by mouth at bedtime.  1  . ziprasidone (GEODON) 80 MG capsule     . methylphenidate (RITALIN) 10 MG tablet      No facility-administered medications prior to visit.    PAST MEDICAL HISTORY: Past Medical History  Diagnosis Date  . Rotator cuff syndrome of left shoulder   . S/P partial hysterectomy   . Addiction, opium   . Therapy     on therapy by psychiatrist using sebutex   . 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 IBS (irritable bowel syndrome) 02/15/2011  . Bipolar disorder 02/15/2011  . Anxiety     ADHD CHRONIC ANXIETY  . Depression   . Diabetes mellitus     sees Dr Altheimer/endo  . Thyroid nodule     s/p biopsy - benign approx jan 2012  . Insomnia 02/21/2011  . Subacute dyskinesia due to drugs(333.85)     dr dohmeier/neuro  . Resting tremor   . Cervical spondylosis without myelopathy   . Orthostatic hypotension   . Allergy     spring  . Hyperlipidemia   . Dysautonomia 10/24/2011  . Tremor   . Parkinsonism   . 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     opium  . Orthostatic hypotension   . Dyskinesia      subacute,due to drugs  . Spondylosis without myelopathy   . POTS (postural orthostatic tachycardia syndrome)   . Tachycardia     syndrome  . Dyslipidemia   . Neuropathy     with paresthesias in feet  . Radiculopathy     cervical  . Multinodular thyroid     with large dominant solid nodule with calcifications in the lower left pole benign by FNA in 1/12 and stable by repeat ulltrasound in 4/13  . Depression   . 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 Oophorectomy    . Herniated disc cervical spine    . Breast lumpectomy Right   . Spine surgery  feb 2012    c5-6 fusion, Dr Rolena Infante  . Lower back  1992  . Thyroid lobectomy  11/11    nodule biopsy, benign  . Partial hysterectomy    . Acromioplasty Left 2002    Dr Gladstone Lighter    FAMILY HISTORY: Family History  Problem Relation Age of Onset  . Lung cancer Mother   . Heart disease Father   . Hypertension Father   . Heart disease Paternal Grandmother   . ALS Paternal Grandmother   . Hypertension Sister     SOCIAL HISTORY: History   Social History  . Marital Status: Single    Spouse Name: N/A  .  Number of Children: 0  . Years of Education: BA   Occupational History  . [employer]   Social History Main Topics  . Smoking status: Never Smoker   . Smokeless tobacco: Never Used  . Alcohol Use: No  . Drug Use: No  . Sexual Activity: No   Other Topics Concern  . Not on file   Social History Narrative   Patient is single and lives with my partner ([place]).   Patient is disabled.   Patient is right-handed.   Patient has a Haematologist.   Patient drinks one soda every other day.      PHYSICAL EXAM  Filed Vitals:   08/21/14 1456  BP: 115/79  Pulse: 78  Height: 5' 7"  (1.702 m)  Weight: 171 lb (77.565 kg)   Body mass index is 26.78 kg/(m^2).  Generalized: Well developed, in no acute distress   Neurological examination  Mentation: Alert oriented to time, place, history taking. Follows all commands speech and language fluent. MMSE 29/30 Cranial nerve II-XII: Pupils were equal round reactive to light. Extraocular movements were full, visual field were full on confrontational test. Facial sensation and strength were normal. Uvula tongue midline. Head turning and shoulder shrug  were normal and symmetric. Motor: The motor testing reveals 5 over 5 strength of all 4 extremities.  Good symmetric motor tone is noted throughout.  Sensory: Sensory testing is intact to soft touch on all 4 extremities. No evidence of extinction is noted.  Coordination: Cerebellar testing reveals good finger-nose-finger and heel-to-shin bilaterally.  Gait and station: Gait is normal. Tandem gait is slightly unsteady. Romberg is negative. No drift is seen.  Reflexes: Deep tendon reflexes are symmetric and normal bilaterally.   DIAGNOSTIC DATA (LABS, IMAGING, TESTING) - I reviewed patient records, labs, notes, testing and imaging myself where available.  Lab Results  Component Value Date   WBC 4.4 08/17/2014   HGB 14.2 08/17/2014   HCT 41.6 08/17/2014   MCV 94.0 08/17/2014   PLT 260.0 08/17/2014      Component Value Date/Time   NA 138 08/17/2014 1522   K 4.0 08/17/2014 1522   CL 103 08/17/2014 1522   CO2 30 08/17/2014 1522   GLUCOSE 197* 08/17/2014 1522   BUN 12 08/17/2014 1522   CREATININE 0.92 08/17/2014 1522   CALCIUM 9.2 08/17/2014 1522   PROT 6.5 08/17/2014 1522   ALBUMIN 3.8 08/17/2014 1522   AST 16 08/17/2014 1522   ALT 16 08/17/2014 1522   ALKPHOS 64 08/17/2014 1522   BILITOT 0.4 08/17/2014 1522   GFRNONAA >60 03/22/2010 1251   GFRAA  03/22/2010 1251    >60        The eGFR has been calculated using the MDRD equation. This calculation has not been validated in all clinical situations. eGFR's persistently <60 mL/min signify possible Chronic Kidney Disease.   Lab Results  Component Value Date   CHOL 160 08/17/2014   HDL 54.80 08/17/2014   LDLCALC 91 08/17/2014   TRIG 69.0 08/17/2014   CHOLHDL 3 08/17/2014   Lab Results  Component Value Date   HGBA1C 9.0* 08/17/2014   Lab Results  Component Value Date   VITAMINB12 944* 08/17/2014   Lab Results  Component Value Date   TSH 2.09 08/17/2014      ASSESSMENT AND PLAN 54 y.o. year old female  has a past medical history of Rotator cuff syndrome of left shoulder; S/P partial hysterectomy; Addiction,  opium; Therapy; Parkinsonism; IBS (irritable bowel  syndrome) (02/15/2011); Bipolar disorder (02/15/2011); Anxiety; Depression; Diabetes mellitus; Thyroid nodule; Insomnia (02/21/2011); Subacute dyskinesia due to drugs(333.85); Resting tremor; Cervical spondylosis without myelopathy; Orthostatic hypotension; Allergy; Hyperlipidemia; Dysautonomia (10/24/2011); Tremor; Parkinsonism; 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; Orthostatic hypotension; Dyskinesia; Spondylosis without myelopathy; POTS (postural orthostatic tachycardia syndrome); Tachycardia; Dyslipidemia; Neuropathy; Radiculopathy; Multinodular thyroid; Depression; and 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 here with:  1. Parkinsonism 2. Memory disturbance  Overall the patient has remained stable. She's not developed any additional symptoms of parkinsonism. The patient does feel that her memory has been affected. Her MMSE today is 29/30. I have advised the patient to talk to her psychiatrist and inform them that since starting the [place] she feels that her cognition has been affected. Patient verbalized understanding. If her symptoms worsen or she develops new symptoms she should let us know. Otherwise she will follow-up in 6 months with Dr. Brett Fairy.     Ward Givens, MSN, NP-C 08/21/2014, 3:07 PM [facility] [address], [place], [place] [address] [phone]  Note: This document was prepared with digital dictation and possible smart phrase technology. Any transcriptional errors that result from this process are unintentional.

## 2014-08-21 NOTE — Patient Instructions (Signed)
Follow up with psychiatrist in regards to your cognition changes after starting Geodon. If your symptoms worsen or you develop new symptoms please let us know.

## 2014-09-01 ENCOUNTER — Other Ambulatory Visit: Payer: Self-pay | Admitting: Internal Medicine

## 2014-09-18 ENCOUNTER — Other Ambulatory Visit: Payer: Self-pay | Admitting: Internal Medicine

## 2014-10-12 ENCOUNTER — Other Ambulatory Visit: Payer: Self-pay | Admitting: Neurology

## 2014-10-12 ENCOUNTER — Other Ambulatory Visit: Payer: Self-pay | Admitting: Internal Medicine

## 2014-11-03 ENCOUNTER — Ambulatory Visit (INDEPENDENT_AMBULATORY_CARE_PROVIDER_SITE_OTHER): Payer: BC Managed Care – PPO | Admitting: Internal Medicine

## 2014-11-03 ENCOUNTER — Encounter: Payer: Self-pay | Admitting: Internal Medicine

## 2014-11-03 VITALS — BP 115/70 | HR 86 | Ht 67.0 in | Wt 170.0 lb

## 2014-11-03 DIAGNOSIS — R55 Syncope and collapse: Secondary | ICD-10-CM | POA: Diagnosis not present

## 2014-11-03 DIAGNOSIS — G90A Postural orthostatic tachycardia syndrome (POTS): Secondary | ICD-10-CM

## 2014-11-03 DIAGNOSIS — I959 Hypotension, unspecified: Secondary | ICD-10-CM | POA: Diagnosis not present

## 2014-11-03 DIAGNOSIS — I951 Orthostatic hypotension: Secondary | ICD-10-CM | POA: Diagnosis not present

## 2014-11-03 DIAGNOSIS — R000 Tachycardia, unspecified: Secondary | ICD-10-CM

## 2014-11-03 MED ORDER — FLUDROCORTISONE ACETATE 0.1 MG PO TABS: ORAL_TABLET | ORAL | Status: DC

## 2014-11-03 NOTE — Patient Instructions (Addendum)
Medication Instructions:   START TAKING FLORINEF  HALF A TABLET  TWICE  A DAY  @ 6AM AND @ 2PM   Labwork:  NONE ORDER TODAY    Testing/Procedures:  NONE ORDER TODAY    Follow-Up:  IN 6 WEEKS WITH AN AVAILABLE APP ON THE SAME DAY DR ROSS IN OFFICE    Any Other Special Instructions Will Be Listed Below (If Applicable).

## 2014-11-03 NOTE — Progress Notes (Signed)
Cardiology Office Note   Date:  11/03/2014   ID:  [name], DOB Jun 17, 1960, MRN [id number]  PCP:  Cathlean Cower, MD  Cardiologist:   Dorris Carnes, MD   No chief complaint on file.     History of Present Illness: Lynn Barnes is a 54 y.o. female with a history of  Patient is a 54 yo with a history of drug induced Parkinson's and autonomic dysfunction. She has been followed by Drs. John and Dohrmeir. Since she says she has done fairly well in regards to dizziness. Heart is not racing as much on pindolol  I saw her in October 2015 She continues to have problems with autonomic dysfunction widh dizzens  Continues to have syncopal spells if gets up too quick from a squat   Current Outpatient Prescriptions  Medication Sig Dispense Refill  . atorvastatin (LIPITOR) 40 MG tablet TAKE 1 TABLET BY MOUTH EVERY DAY 30 tablet 0  . atorvastatin (LIPITOR) 40 MG tablet TAKE 1 TABLET BY MOUTH EVERY DAY 90 tablet 3  . atorvastatin (LIPITOR) 40 MG tablet TAKE 1 TABLET BY MOUTH EVERY DAY 90 tablet 1  . BELSOMRA 15 MG TABS Take 1 tablet by mouth at bedtime.  1  . buPROPion (WELLBUTRIN XL) 300 MG 24 hr tablet Take 300 mg by mouth daily.  1  . clonazePAM (KLONOPIN) 1 MG tablet Take 1 mg by mouth 2 (two) times daily as needed for anxiety.    [name] glucose blood test strip 1 each by Other route. 7 times per day   - as instructed    . Ibuprofen-Famotidine 800-26.6 MG TABS Take 1 tablet by mouth 3 (three) times daily. 90 tablet 3  . NOVOLOG 100 UNIT/ML injection Insulin pump    . pindolol (VISKEN) 5 MG tablet TAKE 1 TABLET (5 MG TOTAL) BY MOUTH 2 (TWO) TIMES DAILY. 180 tablet 0  . risperiDONE (RISPERDAL) 3 MG tablet Take 6 mg by mouth at bedtime.  1  . VIIBRYD 40 MG TABS TAKE 1 TABLET BY MOUTH EVERY DAY 90 tablet 1  . ziprasidone (GEODON) 80 MG capsule      No current facility-administered medications for this visit.    Allergies:   Nsaids and Sulfonamide derivatives   Past Medical History    Diagnosis Date  . Rotator cuff syndrome of left shoulder   . S/P partial hysterectomy   . Addiction, opium   . Therapy     on therapy by psychiatrist using sebutex   . 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 IBS (irritable bowel syndrome) 02/15/2011  . Bipolar disorder 02/15/2011  . Anxiety     ADHD CHRONIC ANXIETY  . Depression   . Diabetes mellitus     sees Dr Altheimer/endo  . Thyroid nodule     s/p biopsy - benign approx jan 2012  . Insomnia 02/21/2011  . Subacute dyskinesia due to drugs(333.85)     dr dohmeier/neuro  . Resting tremor   . Cervical spondylosis without myelopathy   . Orthostatic hypotension   . Allergy     spring  . Hyperlipidemia   . Dysautonomia 10/24/2011  . Tremor   . Parkinsonism   . 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     opium  . Orthostatic hypotension   . Dyskinesia      subacute,due to drugs  . Spondylosis without myelopathy   . POTS (postural orthostatic tachycardia syndrome)   . Tachycardia  syndrome  . Dyslipidemia   . Neuropathy     with paresthesias in feet  . Radiculopathy     cervical  . Multinodular thyroid     with large dominant solid nodule with calcifications in the lower left pole benign by FNA in 1/12 and stable by repeat ulltrasound in 4/13  . Depression   . 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 Oophorectomy    . Herniated disc cervical spine    . Breast lumpectomy Right   . Spine surgery  feb 2012    c5-6 fusion, Dr Rolena Infante  . Lower back  1992  . Thyroid lobectomy  11/11    nodule biopsy, benign  . Partial hysterectomy    . Acromioplasty Left 2002    Dr Gladstone Lighter     Social History:  The patient  reports that she has never smoked. She has never used smokeless tobacco. She reports that she does not drink alcohol or use illicit drugs.   Family History:  The patient's family history includes ALS in her paternal grandmother; Heart disease in her  father and paternal grandmother; Hypertension in her father and sister; Lung cancer in her mother.    ROS:  Please see the history of present illness. All other systems are reviewed and  Negative to the above problem except as noted.    PHYSICAL EXAM: VS:  There were no vitals taken for this visit.  GEN: Well nourished, well developed, in no acute distress HEENT: normal Neck: no JVD, carotid bruits, or masses Cardiac: RRR; no murmurs, rubs, or gallops,no edema  Respiratory:  clear to auscultation bilaterally, normal work of breathing GI: soft, nontender, nondistended, + BS  No hepatomegaly  MS: no deformity Moving all extremities   Skin: warm and dry, no rash Neuro:  Strength and sensation are intact Psych: euthymic mood, full affect   EKG:  EKG is ordered today.  SR 86 bpm     Lipid Panel    Component Value Date/Time   CHOL 160 08/17/2014 1522   TRIG 69.0 08/17/2014 1522   HDL 54.80 08/17/2014 1522   CHOLHDL 3 08/17/2014 1522   VLDL 13.8 08/17/2014 1522   LDLCALC 91 08/17/2014 1522      Wt Readings from Last 3 Encounters:  08/21/14 171 lb (77.565 kg)  08/17/14 174 lb (78.926 kg)  01/26/14 166 lb 2 oz (75.354 kg)      ASSESSMENT AND PLAN: 1  Autonomic dysfunction  Pt is orthostatic on exam with drop in BP   She had been on florinef in past  i can't see why this was stopped  I would add back 0.05 mg at 8 and 2.    Continue to push fluids and salt.  F/U in clinic in 4 to 6 wks    Signed, Dorris Carnes, MD  11/03/2014 1:03 PM    [address], [place], [place]  [address] Phone: [phone]; Fax: [phone]

## 2014-11-27 ENCOUNTER — Other Ambulatory Visit: Payer: Self-pay | Admitting: Internal Medicine

## 2014-12-23 ENCOUNTER — Other Ambulatory Visit: Payer: Self-pay | Admitting: Internal Medicine

## 2014-12-24 NOTE — Progress Notes (Signed)
Cardiology Office Note   Date:  12/25/2014   ID:  Lynn Barnes, DOB 1961/01/14, MRN [id number]   Patient Care Team: Biagio Borg, MD as PCP - General (Internal Medicine) Fay Records, MD as Consulting Physician (Cardiology)    Chief Complaint  Patient presents with  . Follow-up    Autonomic Dysfunction     History of Present Illness: Lynn Barnes is a 54 y.o. female with a hx of drug-induced Parkinson's and autonomic dysfunction, diabetes, HL. She's been treated with pindolol. Last seen by Dr. Harrington Challenger 9/16. She continued to complain of orthostatic intolerance and syncope with standing. Patient was off of Florinef. She was started back on 0.1 mg one half tablet twice a day.    She returns for FU.  She is doing well. She has had much less dizziness with standing.  No further syncope.  No chest pain.  She notes DOE.  This has been going on for 1-1.5 years.  It is no worse.  She gets short of breath sometimes with minimal exertion.  She is overall NYHA 2b.  She has an abnormal gait with Parkinsonism.  She sleeps on 3 pillows.  Denies PND, edema.  No cough or wheezing.    Studies/Reports Reviewed Today:  Echo 3/14 Mild LVH, EF 60%, normal wall motion, diastolic dysfunction, normal RV function  Event Monitor 10/13 freq sinus tachy, NSVT  Carotid US 2/13 No ICA stenosis   Past Medical History  Diagnosis Date  . Rotator cuff syndrome of left shoulder   . S/P partial hysterectomy   . Addiction, opium ([place])   . Therapy     on therapy by psychiatrist using sebutex   . Parkinsonism ([place])     due to prolonged prozac/risperdl for yrs/Dr Dohmeir  . IBS (irritable bowel syndrome) 02/15/2011  . Bipolar disorder ([place]) 02/15/2011  . Anxiety     ADHD CHRONIC ANXIETY  . Depression   . Diabetes mellitus     sees Dr Altheimer/endo  . Thyroid nodule     s/p biopsy - benign approx jan 2012  . Insomnia 02/21/2011  . Subacute dyskinesia due to drugs(333.85)     dr dohmeier/neuro  .  Resting tremor   . Cervical spondylosis without myelopathy   . Orthostatic hypotension   . Allergy     spring  . Hyperlipidemia   . Dysautonomia 10/24/2011  . Tremor   . Parkinsonism ([place])   . 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 ([place])     opium  . Orthostatic hypotension   . Dyskinesia      subacute,due to drugs  . Spondylosis without myelopathy   . POTS (postural orthostatic tachycardia syndrome)   . Tachycardia     syndrome  . Dyslipidemia   . Neuropathy ([facility])     with paresthesias in feet  . Radiculopathy     cervical  . Multinodular thyroid     with large dominant solid nodule with calcifications in the lower left pole benign by FNA in 1/12 and stable by repeat ulltrasound in 4/13  . Depression   . 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 Oophorectomy    . Herniated disc cervical spine    . Breast lumpectomy Right   . Spine surgery  feb 2012    c5-6 fusion, Dr Rolena Infante  . Lower back  1992  . Thyroid lobectomy  11/11    nodule biopsy, benign  .  Partial hysterectomy    . Acromioplasty Left 2002    Dr Gladstone Lighter     Current Outpatient Prescriptions  Medication Sig Dispense Refill  . atorvastatin (LIPITOR) 40 MG tablet TAKE 1 TABLET BY MOUTH EVERY DAY 30 tablet 0  . BELSOMRA 15 MG TABS Take 1 tablet by mouth at bedtime.  1  . clonazePAM (KLONOPIN) 1 MG tablet Take 1 mg by mouth 2 (two) times daily as needed for anxiety.    . fludrocortisone (FLORINEF) 0.1 MG tablet TAKE A HALF TABLET  TWICE A DAY @ 6AM @ 2PM 30 tablet 3  . glucose blood test strip 1 each by Other route. 7 times per day   - as instructed    . NOVOLOG 100 UNIT/ML injection Insulin pump    . pindolol (VISKEN) 5 MG tablet TAKE 1 TABLET (5 MG TOTAL) BY MOUTH 2 (TWO) TIMES DAILY. 180 tablet 3  . risperiDONE (RISPERDAL) 3 MG tablet Take 6 mg by mouth at bedtime.  1  . ziprasidone (GEODON) 80 MG capsule Take 80 mg by mouth 3 (three) times daily.      No current  facility-administered medications for this visit.    Allergies:   Nsaids and Sulfonamide derivatives    Social History:   Social History   Social History  . Marital Status: Single    Spouse Name: N/A  . Number of Children: 0  . Years of Education: BA   Occupational History  . [employer]   Social History Main Topics  . Smoking status: Never Smoker   . Smokeless tobacco: Never Used  . Alcohol Use: No  . Drug Use: No  . Sexual Activity: No   Other Topics Concern  . None   Social History Narrative   Patient is single and lives with my partner Anne Ng Mears).   Patient is disabled.   Patient is right-handed.   Patient has a Haematologist.   Patient drinks one soda every other day.     Family History:   Family History  Problem Relation Age of Onset  . Lung cancer Mother   . Heart disease Father   . Hypertension Father   . Heart disease Paternal Grandmother   . ALS Paternal Grandmother   . Hypertension Sister       ROS:   Please see the history of present illness.   Review of Systems  Constitution: Positive for malaise/fatigue.  Cardiovascular: Positive for chest pain, dyspnea on exertion and syncope.  Respiratory: Positive for snoring.   Neurological: Positive for dizziness and loss of balance.  Psychiatric/Behavioral: Positive for depression. The patient is nervous/anxious.   All other systems reviewed and are negative.     PHYSICAL EXAM: VS:  BP 118/84 mmHg  Pulse 82  Ht 5\' 7"  (1.702 m)  Wt 170 lb 12.8 oz (77.474 kg)  BMI 26.74 kg/m2    Orthostatic VS for the past 24 hrs:  BP- Lying Pulse- Lying BP- Sitting Pulse- Sitting BP- Standing at 0 minutes Pulse- Standing at 0 minutes  12/25/14 1358 123/79 mmHg 85 119/79 mmHg 83 107/77 mmHg 90    Wt Readings from Last 3 Encounters:  12/25/14 170 lb 12.8 oz (77.474 kg)  11/03/14 170 lb (77.111 kg)  08/21/14 171 lb (77.565 kg)     GEN: Well nourished, well developed, in no  acute distress HEENT: normal Neck: no JVD,  no masses Cardiac:  Normal S1/S2, RRR; no murmur ,  no rubs or gallops, no  edema   Respiratory:  clear to auscultation bilaterally, no wheezing, rhonchi or rales. GI: soft, nontender, nondistended, + BS MS: no deformity or atrophy Skin: warm and dry  Neuro:  CNs II-XII intact, Strength and sensation are intact Psych: Normal affect   EKG:  EKG is not ordered today.  It demonstrates:   n/a   Recent Labs: 08/17/2014: ALT 16; BUN 12; Creatinine, Ser 0.92; Hemoglobin 14.2; Platelets 260.0; Potassium 4.0; Sodium 138; TSH 2.09    Lipid Panel    Component Value Date/Time   CHOL 160 08/17/2014 1522   TRIG 69.0 08/17/2014 1522   HDL 54.80 08/17/2014 1522   CHOLHDL 3 08/17/2014 1522   VLDL 13.8 08/17/2014 1522   LDLCALC 91 08/17/2014 1522      ASSESSMENT AND PLAN:  1. Autonomic Dysfunction:  Hx of POTS.  Orthostatic BP drop is improved on Florinef.  She denies any further syncope.  Continue abdominal binder, liberalization of salt and increased fluid intake. Check BMET today.  2. Shortness of Breath:  She is a long time diabetic.  She has not had chest pain but her symptoms could be an anginal equivalent.  They have been stable, which is reassuring. Will obtain a Lexiscan Myoview to r/o ischemic heart disease.  She cannot walk on a treadmill given her Parkinsonian gait.  3. Hyperlipidemia:  Followed by PCP.  LDL in 7/16 was 91.  Continue statin.  4. Diabetes Mellitus:  FU with PCP.     Medication Changes: Current medicines are reviewed at length with the patient today.  Concerns regarding medicines are as outlined above.  The following changes have been made:   Discontinued Medications   ATORVASTATIN (LIPITOR) 40 MG TABLET    TAKE 1 TABLET BY MOUTH EVERY DAY   ATORVASTATIN (LIPITOR) 40 MG TABLET    TAKE 1 TABLET BY MOUTH EVERY DAY   IBUPROFEN-FAMOTIDINE 800-26.6 MG TABS    Take 1 tablet by mouth 3 (three) times daily.   Modified  Medications   No medications on file   New Prescriptions   No medications on file   Labs/ tests ordered today include:   Orders Placed This Encounter  Procedures  . Basic Metabolic Panel (BMET)  . Myocardial Perfusion Imaging     Disposition:    FU with Dr. Dorris Carnes 6 mos     Signed, Versie Starks, MHS 12/25/2014 2:29 PM    [address], [place], [place]  [address] Phone: [phone]; Fax: [phone]

## 2014-12-25 ENCOUNTER — Ambulatory Visit (INDEPENDENT_AMBULATORY_CARE_PROVIDER_SITE_OTHER): Payer: BC Managed Care – PPO | Admitting: Physician Assistant

## 2014-12-25 ENCOUNTER — Encounter: Payer: Self-pay | Admitting: Physician Assistant

## 2014-12-25 VITALS — BP 118/84 | HR 82 | Ht 67.0 in | Wt 170.8 lb

## 2014-12-25 DIAGNOSIS — E785 Hyperlipidemia, unspecified: Secondary | ICD-10-CM | POA: Diagnosis not present

## 2014-12-25 DIAGNOSIS — G90A Postural orthostatic tachycardia syndrome (POTS): Secondary | ICD-10-CM

## 2014-12-25 DIAGNOSIS — R0602 Shortness of breath: Secondary | ICD-10-CM

## 2014-12-25 DIAGNOSIS — R000 Tachycardia, unspecified: Secondary | ICD-10-CM | POA: Diagnosis not present

## 2014-12-25 DIAGNOSIS — I951 Orthostatic hypotension: Secondary | ICD-10-CM

## 2014-12-25 LAB — BASIC METABOLIC PANEL
BUN: 13 mg/dL (ref 7–25)
CHLORIDE: 102 mmol/L (ref 98–110)
CO2: 29 mmol/L (ref 20–31)
Calcium: 9.3 mg/dL (ref 8.6–10.4)
Creat: 0.96 mg/dL (ref 0.50–1.05)
GLUCOSE: 181 mg/dL — AB (ref 65–99)
POTASSIUM: 3.9 mmol/L (ref 3.5–5.3)
SODIUM: 139 mmol/L (ref 135–146)

## 2014-12-25 NOTE — Patient Instructions (Signed)
Medication Instructions:   Your physician recommends that you continue on your current medications as directed. Please refer to the Current Medication list given to you today.     If you need a refill on your cardiac medications before your next appointment, please call your pharmacy.  Labwork: BMET TODAY    Testing/Procedures:    Your physician has requested that you have a lexiscan myoview. For further information please visit [email or web address]. Please follow instruction sheet, as given.      Follow-Up:   Your physician wants you to follow-up in:  IN [place] will receive a reminder letter in the mail two months in advance. If you don't receive a letter, please call our office to schedule the follow-up appointment.     Any Other Special Instructions Will Be Listed Below (If Applicable).

## 2014-12-27 ENCOUNTER — Telehealth (HOSPITAL_COMMUNITY): Payer: Self-pay | Admitting: *Deleted

## 2014-12-27 NOTE — Telephone Encounter (Signed)
Patient given detailed instructions per Myocardial Perfusion Study Information Sheet for the test on 01/02/15 at 715. Patient notified to arrive 15 minutes early and that it is imperative to arrive on time for appointment to keep from having the test rescheduled.  If you need to cancel or reschedule your appointment, please call the office within 24 hours of your appointment. Failure to do so may result in a cancellation of your appointment, and a $50 no show fee. Patient verbalized understanding.Hubbard Robinson, RN

## 2015-01-02 ENCOUNTER — Ambulatory Visit (HOSPITAL_COMMUNITY): Payer: BC Managed Care – PPO | Attending: Internal Medicine

## 2015-01-02 ENCOUNTER — Encounter: Payer: Self-pay | Admitting: Physician Assistant

## 2015-01-02 DIAGNOSIS — R0602 Shortness of breath: Secondary | ICD-10-CM | POA: Insufficient documentation

## 2015-01-02 DIAGNOSIS — R55 Syncope and collapse: Secondary | ICD-10-CM | POA: Insufficient documentation

## 2015-01-02 DIAGNOSIS — E119 Type 2 diabetes mellitus without complications: Secondary | ICD-10-CM | POA: Diagnosis not present

## 2015-01-02 DIAGNOSIS — R0609 Other forms of dyspnea: Secondary | ICD-10-CM | POA: Diagnosis not present

## 2015-01-02 LAB — MYOCARDIAL PERFUSION IMAGING
CHL CUP NUCLEAR SDS: 8
CHL CUP NUCLEAR SRS: 3
LV dias vol: 66 mL
LV sys vol: 12 mL
Peak HR: 95 {beats}/min
RATE: 0.27
Rest HR: 73 {beats}/min
SSS: 11
TID: 0.99

## 2015-01-02 MED ORDER — REGADENOSON 0.4 MG/5ML IV SOLN
0.4000 mg | Freq: Once | INTRAVENOUS | Status: AC
  Administered 2015-01-02: 0.4 mg via INTRAVENOUS

## 2015-01-02 MED ORDER — TECHNETIUM TC 99M SESTAMIBI GENERIC - CARDIOLITE
10.3000 | Freq: Once | INTRAVENOUS | Status: AC | PRN
  Administered 2015-01-02: 10 via INTRAVENOUS

## 2015-01-02 MED ORDER — TECHNETIUM TC 99M SESTAMIBI GENERIC - CARDIOLITE
32.7000 | Freq: Once | INTRAVENOUS | Status: AC | PRN
  Administered 2015-01-02: 32.7 via INTRAVENOUS

## 2015-01-08 ENCOUNTER — Telehealth: Payer: Self-pay | Admitting: Physician Assistant

## 2015-01-08 NOTE — Telephone Encounter (Signed)
Pt notified of normal myoview results by phone with verbal understanding 

## 2015-01-08 NOTE — Telephone Encounter (Signed)
F/u  Pt calling back to give mobile number to use- pt will be out of the house- [phone]

## 2015-01-08 NOTE — Telephone Encounter (Signed)
New message ° ° ° ° ° °Calling to get stress test results °

## 2015-01-25 IMAGING — CR DG LUMBAR SPINE COMPLETE 4+V
5 series · 5 of 5 positions shown · non-contrast
Comparison: No prior available for comparison.

CLINICAL DATA: 53-year-old female with right hip and back pain. No
known injury.

EXAM:
LUMBAR SPINE - COMPLETE 4+ VIEW

[view not recorded (1 of 5)]
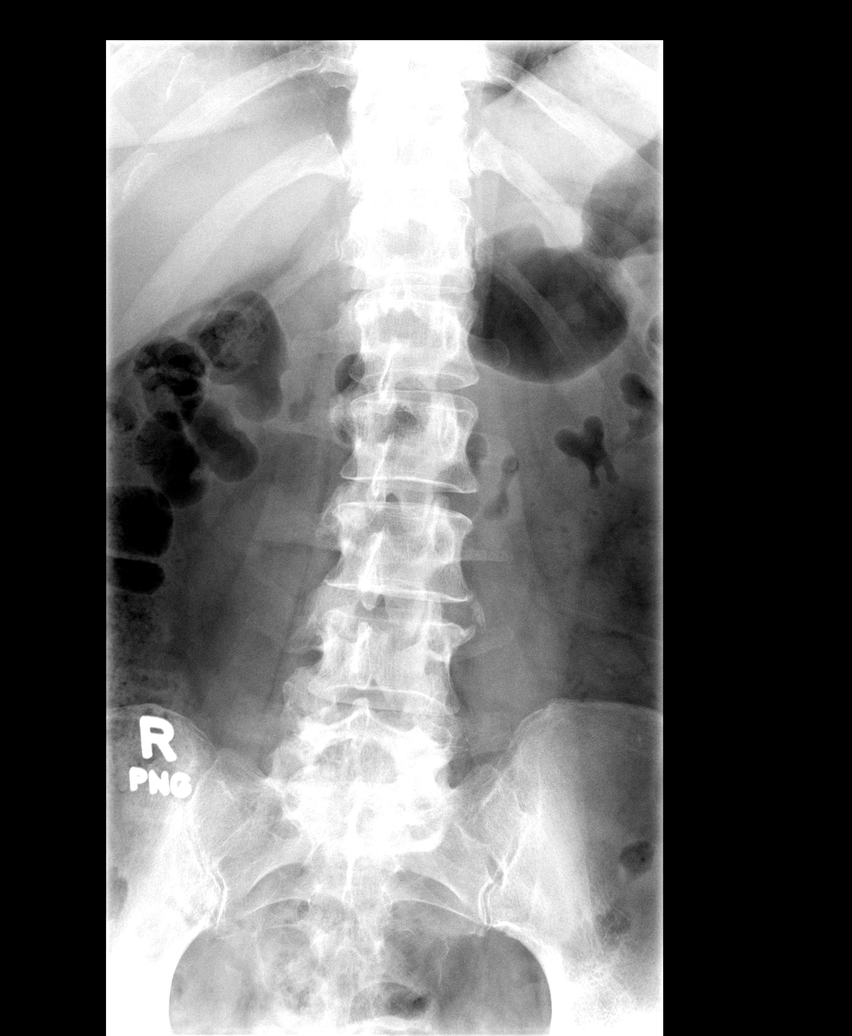

[view not recorded (2 of 5)]
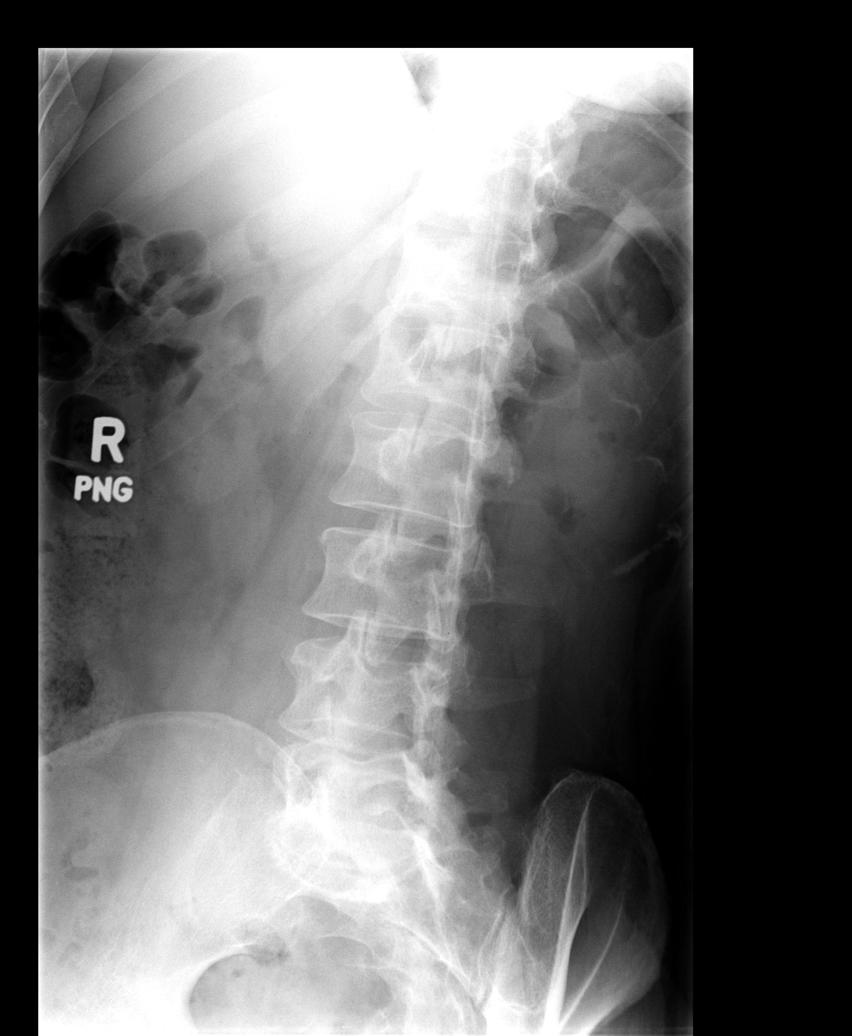

[view not recorded (3 of 5)]
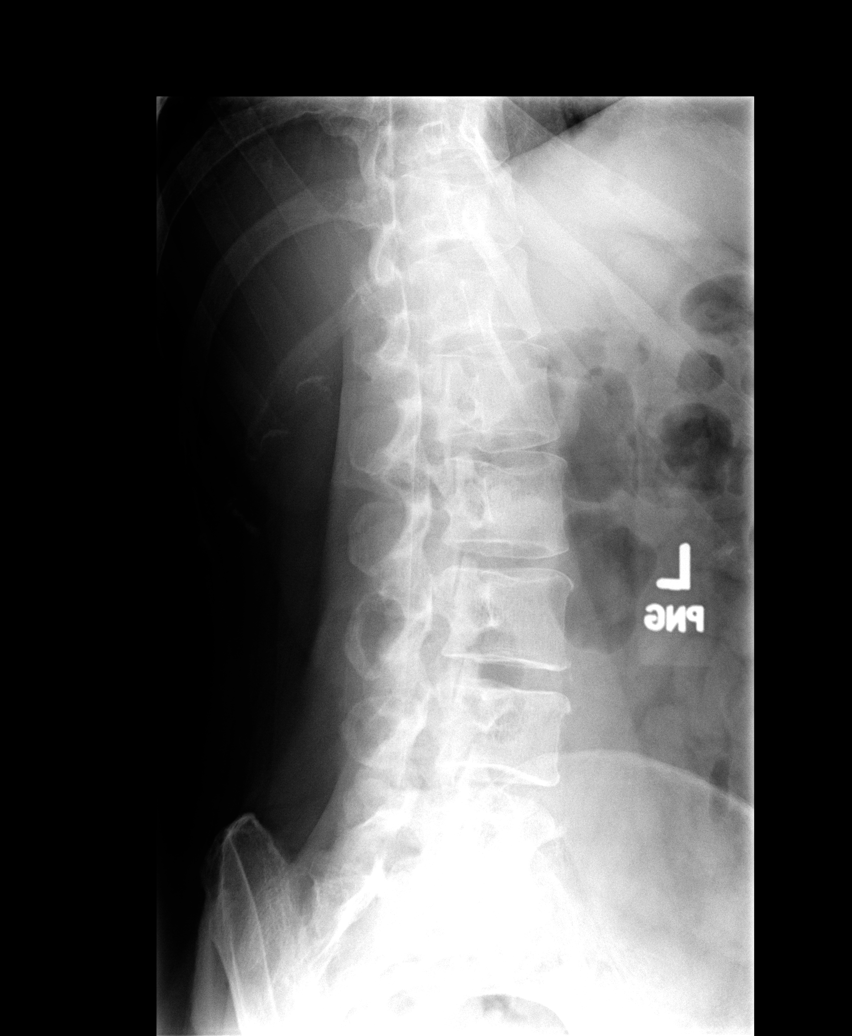

[view not recorded (4 of 5)]
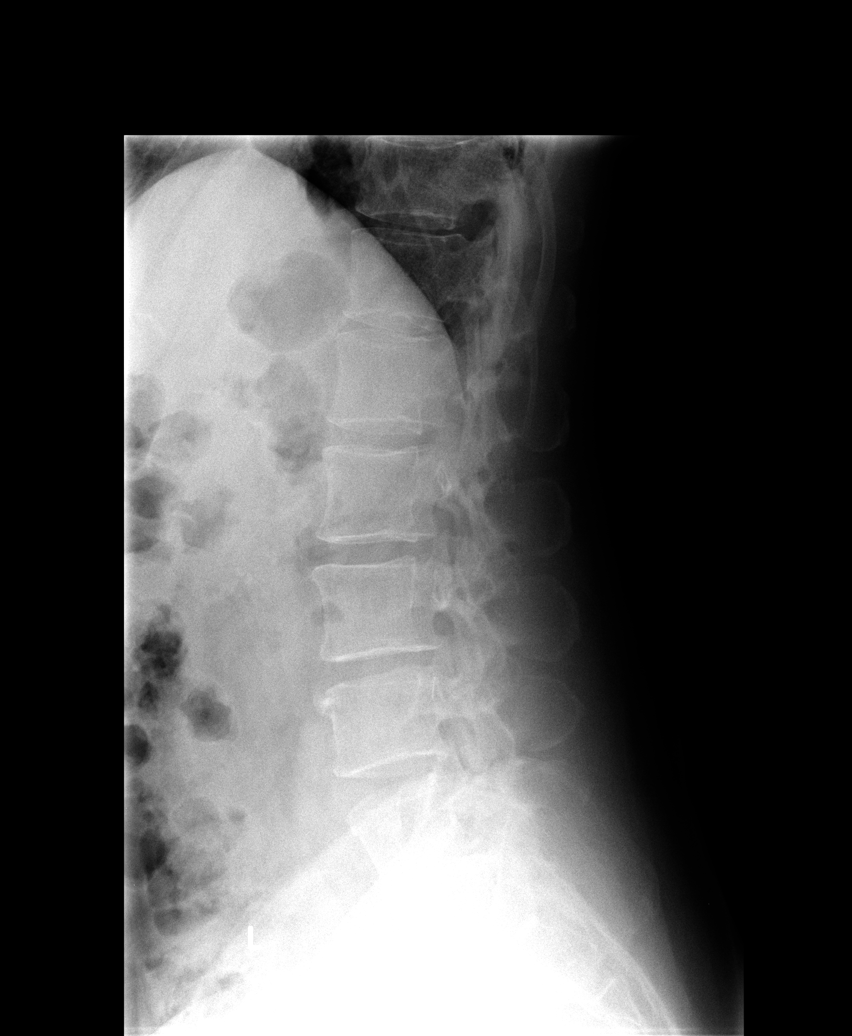

[view not recorded (5 of 5)]
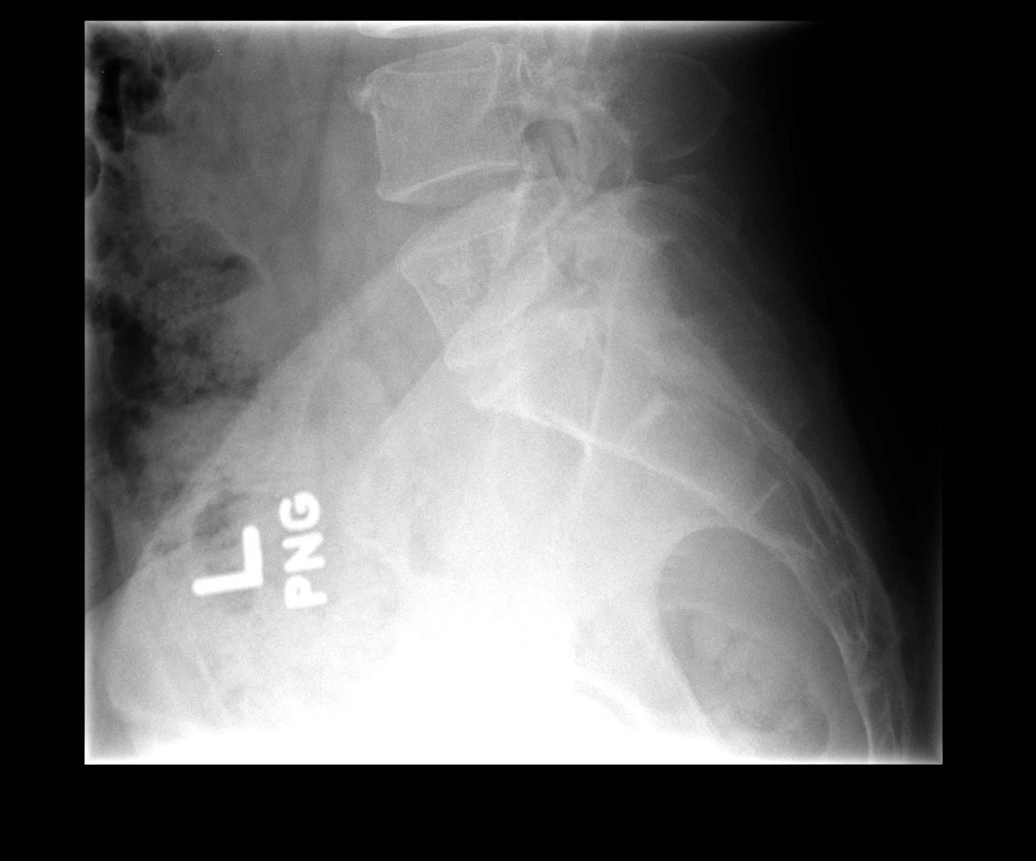

[5 of 5 positions shown; findings below may reference images not displayed]

FINDINGS: Lumbar vertebral bodies maintain normal anatomic alignment without
evidence of a fracture, subluxation, anterolisthesis, or
retrolisthesis. Vertebral body heights and disc space heights
relatively maintained.

No pars defect identified on oblique views. Early facet disease of
L5-S1.
IMPRESSION: No acute bony abnormality identified.

Early facet disease of L5-S1.

## 2015-01-25 IMAGING — CR DG HIP COMPLETE 2+V*R*
3 series · 3 of 3 positions shown · non-contrast
Comparison: None.

CLINICAL DATA: 53-year-old female with a history of right hip and
lower back pain. No known injury.

EXAM:
RIGHT HIP - COMPLETE 2+ VIEW

[view not recorded (1 of 3)]
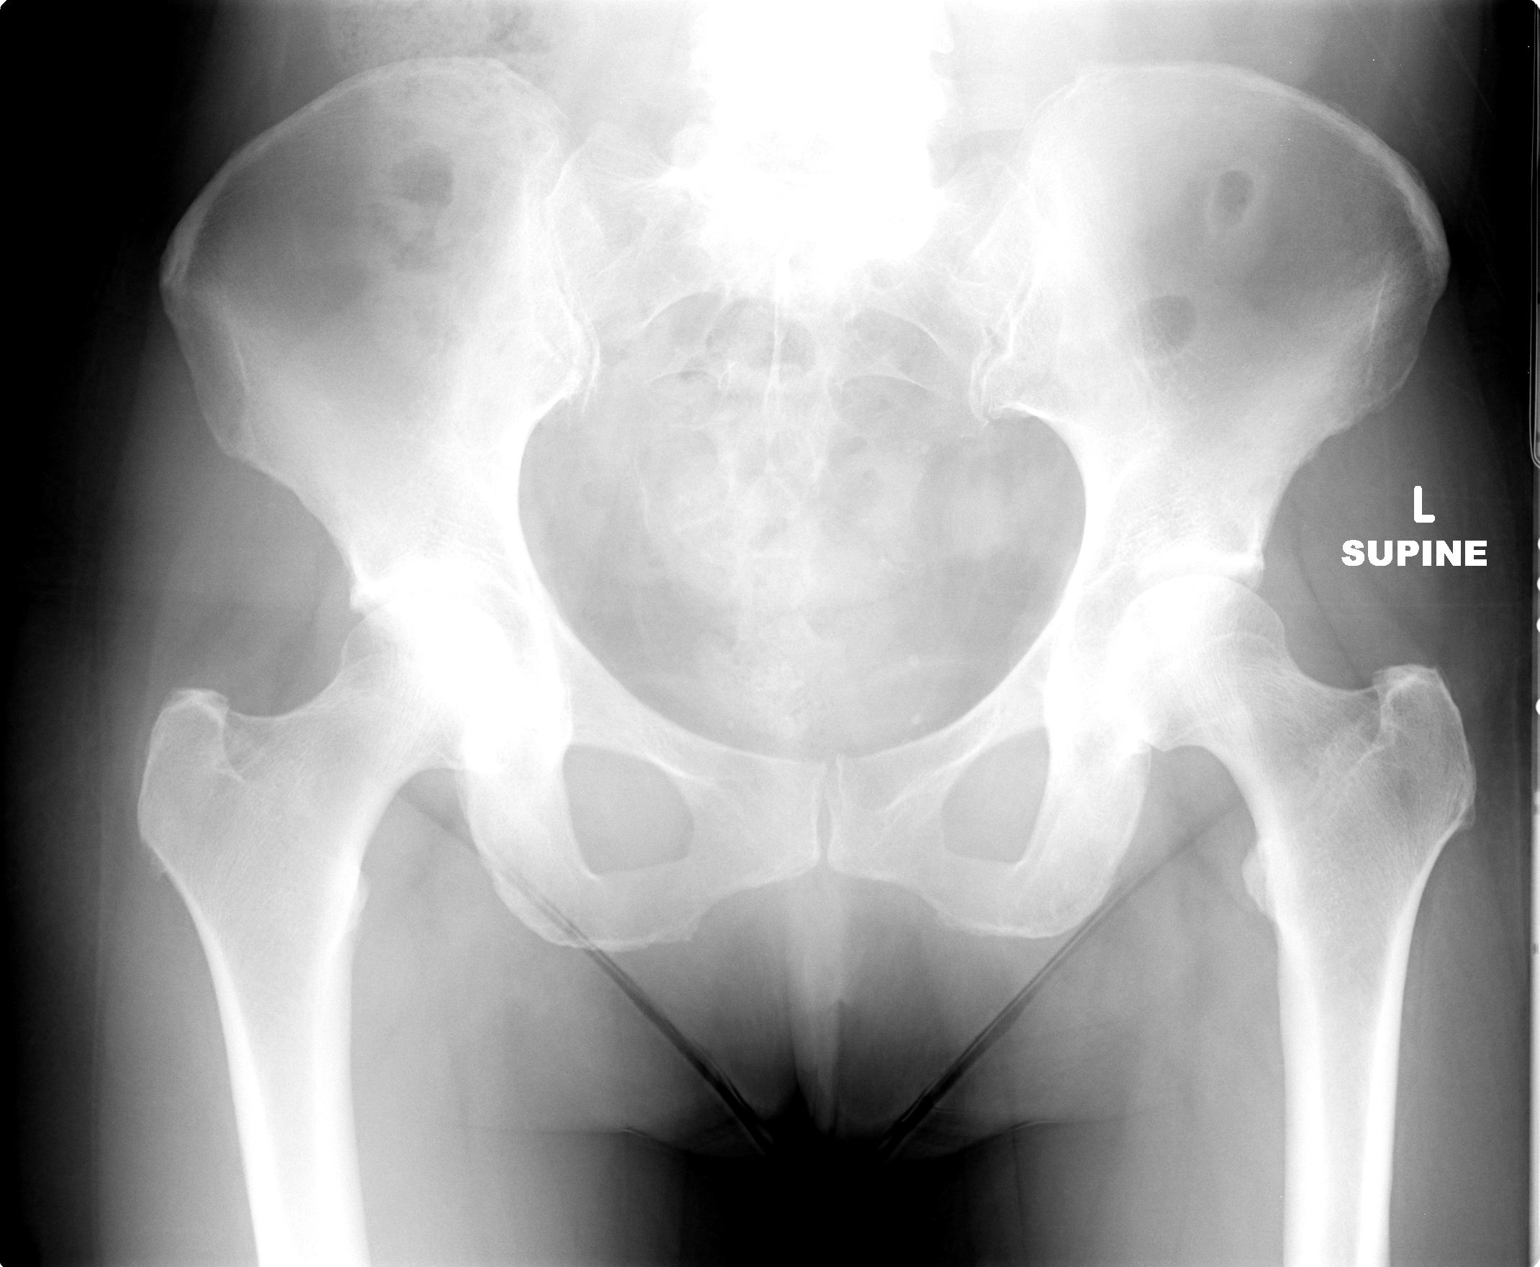

[view not recorded (2 of 3)]
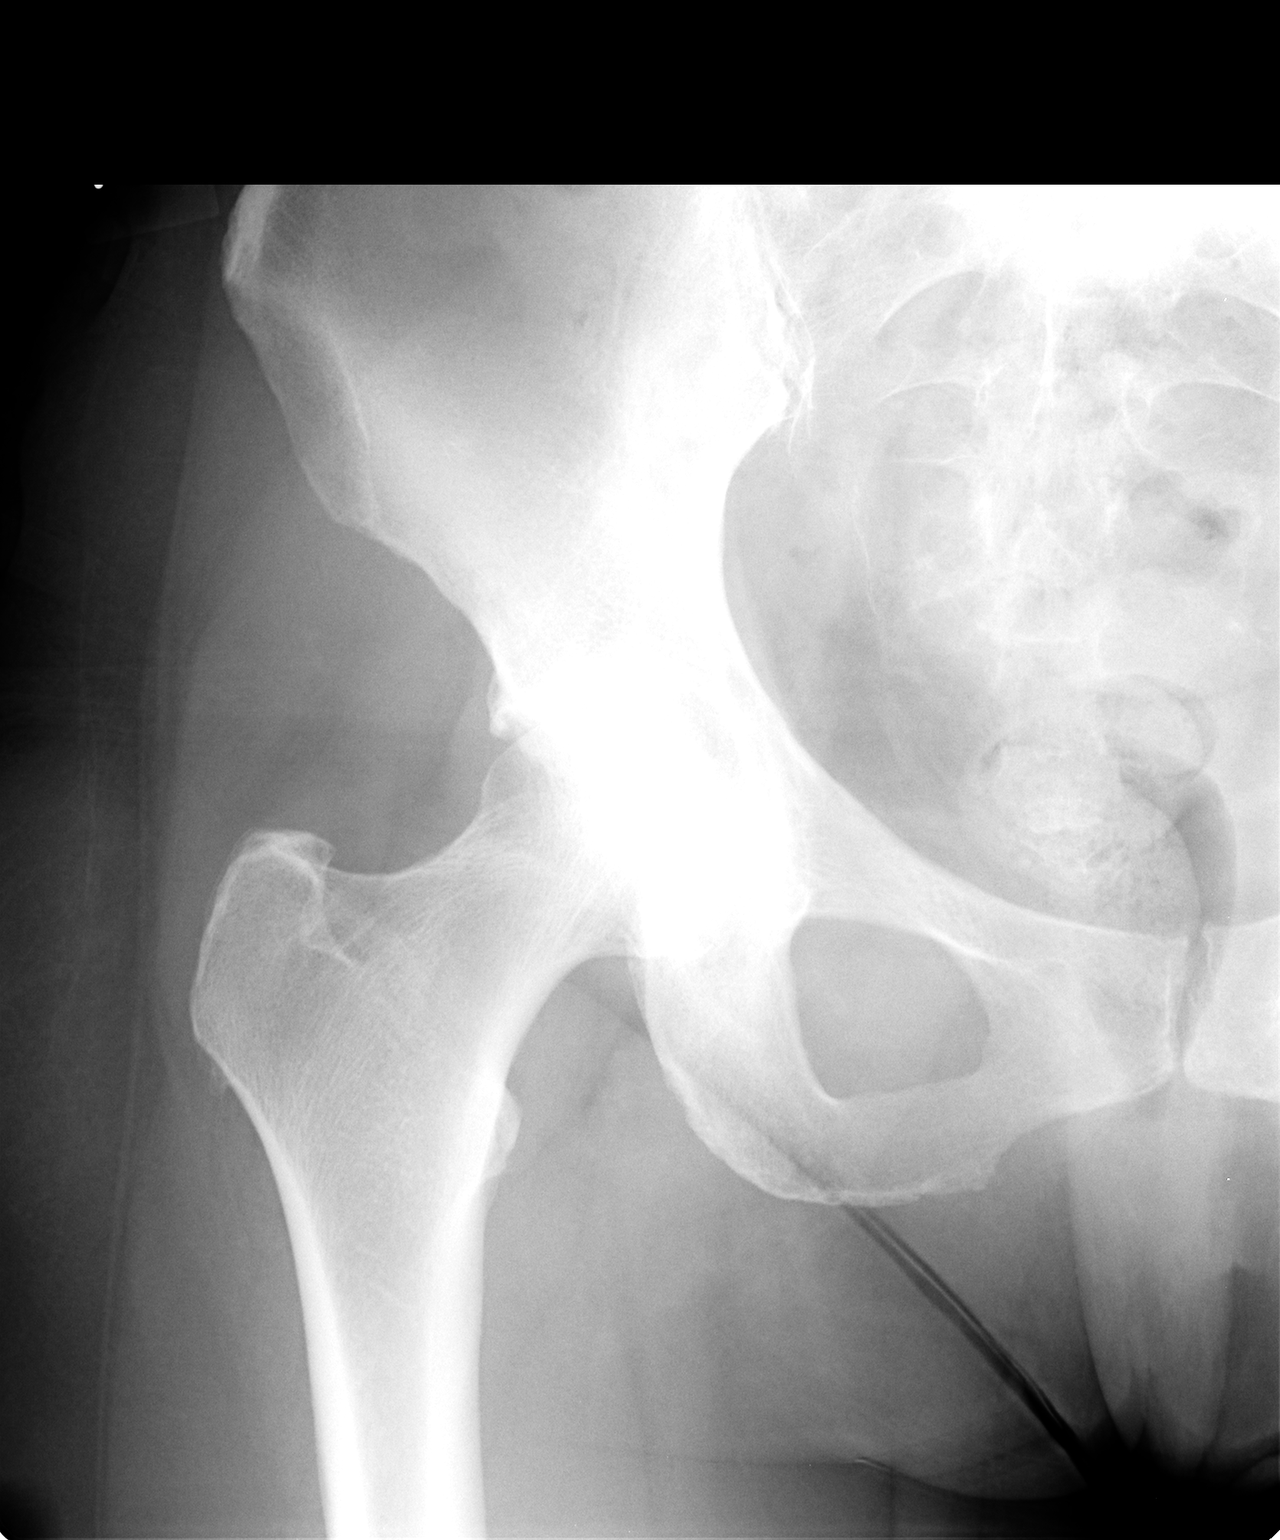

[view not recorded (3 of 3)]
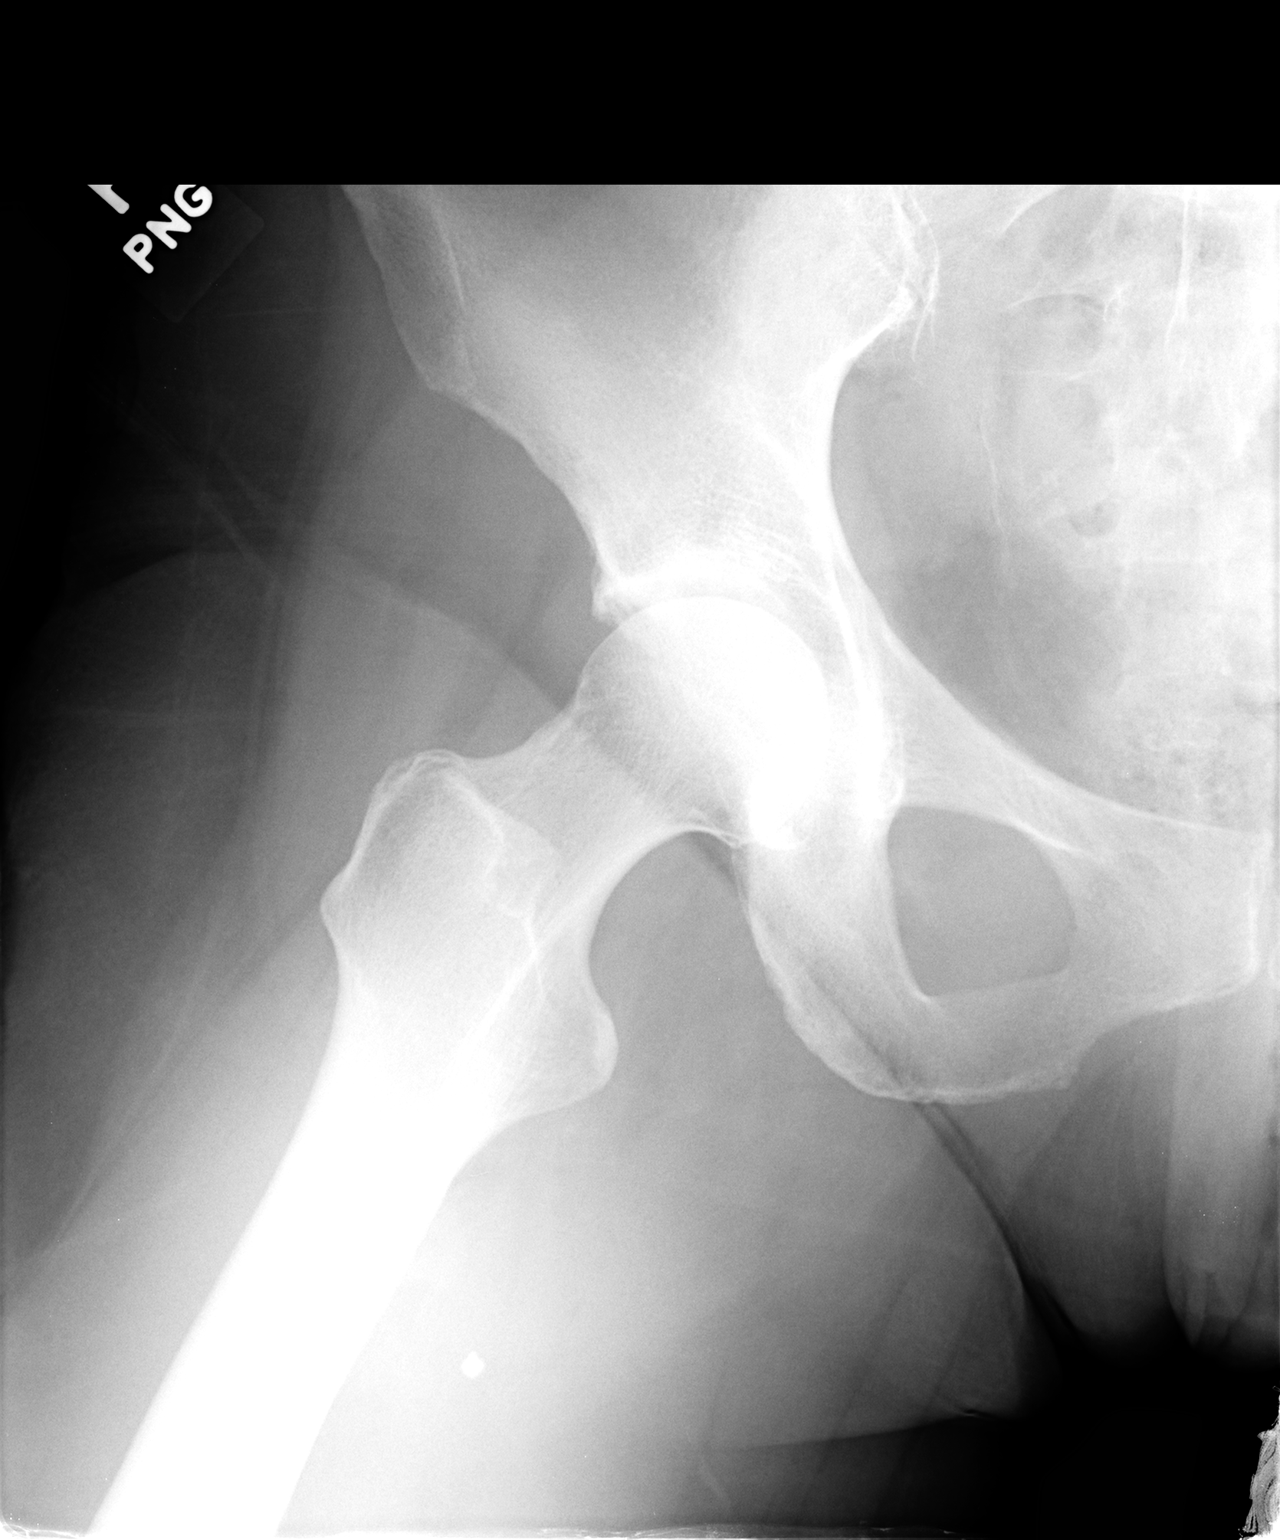

[3 of 3 positions shown; findings below may reference images not displayed]

FINDINGS: There is no evidence of hip fracture or dislocation. There is no
evidence of arthropathy or other focal bone abnormality.
IMPRESSION: Negative.

## 2015-02-01 ENCOUNTER — Encounter: Payer: Self-pay | Admitting: Internal Medicine

## 2015-02-01 LAB — HM DIABETES EYE EXAM

## 2015-02-20 ENCOUNTER — Encounter: Payer: Self-pay | Admitting: Internal Medicine

## 2015-02-20 ENCOUNTER — Ambulatory Visit (INDEPENDENT_AMBULATORY_CARE_PROVIDER_SITE_OTHER): Payer: BC Managed Care – PPO | Admitting: Internal Medicine

## 2015-02-20 VITALS — BP 116/70 | HR 82 | Temp 97.9°F | Ht 67.0 in | Wt 167.0 lb

## 2015-02-20 DIAGNOSIS — E785 Hyperlipidemia, unspecified: Secondary | ICD-10-CM | POA: Diagnosis not present

## 2015-02-20 DIAGNOSIS — Z0000 Encounter for general adult medical examination without abnormal findings: Secondary | ICD-10-CM

## 2015-02-20 DIAGNOSIS — E1143 Type 2 diabetes mellitus with diabetic autonomic (poly)neuropathy: Secondary | ICD-10-CM

## 2015-02-20 DIAGNOSIS — J309 Allergic rhinitis, unspecified: Secondary | ICD-10-CM | POA: Diagnosis not present

## 2015-02-20 DIAGNOSIS — Z0189 Encounter for other specified special examinations: Secondary | ICD-10-CM | POA: Diagnosis not present

## 2015-02-20 NOTE — Assessment & Plan Note (Addendum)
stable overall by history and exam, recent data reviewed with pt, and pt to continue medical treatment as before,  to f/u any worsening symptoms or concerns Lab Results  Component Value Date   LDLCALC 91 08/17/2014

## 2015-02-20 NOTE — Assessment & Plan Note (Signed)
With profuse congestion and tearing several times per wk, meds not working, ok for allergy referral

## 2015-02-20 NOTE — Progress Notes (Signed)
Subjective:    Patient ID: Lynn Barnes, female    DOB: 07-28-1960, 55 y.o.   MRN: [id number]  HPI  Here to f/u; overall doing ok,  Pt denies chest pain, increasing sob or doe, wheezing, orthopnea, PND, increased LE swelling, palpitations, dizziness or syncope.  Pt denies new neurological symptoms such as new headache, or facial or extremity weakness or numbness.  Pt denies polydipsia, polyuria, or low sugar episode.   Pt denies new neurological symptoms such as new headache, or facial or extremity weakness or numbness.   Pt states overall good compliance with meds, mostly trying to follow appropriate diet, with wt overall stable,  but little exercise however. POTS tx working ok, has fallen on ly 3 times in past 6 mo, no injuries. Wt Readings from Last 3 Encounters:  02/20/15 167 lb (75.751 kg)  01/02/15 170 lb (77.111 kg)  12/25/14 170 lb 12.8 oz (77.474 kg)   Past Medical History  Diagnosis Date  . Rotator cuff syndrome of left shoulder   . S/P partial hysterectomy   . Addiction, opium ([place])   . Therapy     on therapy by psychiatrist using sebutex   . Parkinsonism ([place])     due to prolonged prozac/risperdl for yrs/Dr Dohmeir  . IBS (irritable bowel syndrome) 02/15/2011  . Bipolar disorder ([place]) 02/15/2011  . Anxiety     ADHD CHRONIC ANXIETY  . Depression   . Diabetes mellitus     sees Dr Altheimer/endo  . Thyroid nodule     s/p biopsy - benign approx jan 2012  . Insomnia 02/21/2011  . Subacute dyskinesia due to drugs(333.85)     dr dohmeier/neuro  . Resting tremor   . Cervical spondylosis without myelopathy   . Orthostatic hypotension   . Allergy     spring  . Hyperlipidemia   . Dysautonomia 10/24/2011  . Tremor   . Parkinsonism ([place])   . 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 ([place])     opium  . Orthostatic hypotension   . Dyskinesia      subacute,due to drugs  . Spondylosis without myelopathy   . POTS (postural orthostatic tachycardia syndrome)   . Tachycardia     syndrome  .  Dyslipidemia   . Neuropathy ([facility])     with paresthesias in feet  . Radiculopathy     cervical  . Multinodular thyroid     with large dominant solid nodule with calcifications in the lower left pole benign by FNA in 1/12 and stable by repeat ulltrasound in 4/13  . Depression   . 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 History of nuclear stress test     Myoview 11/16: EF 81%, no ischemia; Low Risk   Past Surgical History  Procedure Laterality Date  . Oophorectomy    . Herniated disc cervical spine    . Breast lumpectomy Right   . Spine surgery  feb 2012    c5-6 fusion, Dr Rolena Infante  . Lower back  1992  . Thyroid lobectomy  11/11    nodule biopsy, benign  . Partial hysterectomy    . Acromioplasty Left 2002    Dr Gladstone Lighter    reports that she has never smoked. She has never used smokeless tobacco. She reports that she does not drink alcohol or use illicit drugs. family history includes ALS in her paternal grandmother; Heart disease in her father and paternal grandmother; Hypertension in her father and sister; Lung cancer in her mother. Allergies  Allergen Reactions  . Nsaids Other (See Comments)    Irritates stomach  . Sulfonamide Derivatives Rash   Current Outpatient Prescriptions on File Prior to Visit  Medication Sig Dispense Refill  . atorvastatin (LIPITOR) 40 MG tablet TAKE 1 TABLET BY MOUTH EVERY DAY 30 tablet 0  . clonazePAM (KLONOPIN) 1 MG tablet Take 1 mg by mouth 2 (two) times daily as needed for anxiety.    . fludrocortisone (FLORINEF) 0.1 MG tablet Take one half tablet by mouth twice daily at 6AM and 2PM 30 tablet 11  . glucose blood test strip 1 each by Other route. 7 times per day   - as instructed    . NOVOLOG 100 UNIT/ML injection Insulin pump    . pindolol (VISKEN) 5 MG tablet TAKE 1 TABLET (5 MG TOTAL) BY MOUTH 2 (TWO) TIMES DAILY. 180 tablet 3  . ziprasidone (GEODON) 80 MG capsule Take 80 mg by mouth 3 (three) times daily.      No current  facility-administered medications on file prior to visit.     Review of Systems  Constitutional: Negative for unusual diaphoresis or night sweats HENT: Negative for ringing in ear or discharge Eyes: Negative for double vision or worsening visual disturbance.  Respiratory: Negative for choking and stridor.   Gastrointestinal: Negative for vomiting or other signifcant bowel change Genitourinary: Negative for hematuria or change in urine volume.  Musculoskeletal: Negative for other MSK pain or swelling Skin: Negative for color change and worsening wound.  Neurological: Negative for tremors and numbness other than noted , does have slurring of speech most mornings Psychiatric/Behavioral: Negative for decreased concentration or agitation other than above   Does have significant eye and  Nasal congestion with watering profuesely at times, many times per wk, ot allergy med not working.    Objective:   Physical Exam BP 116/70 mmHg  Pulse 82  Temp(Src) 97.9 F (36.6 C) (Oral)  Ht 5\' 7"  (1.702 m)  Wt 167 lb (75.751 kg)  BMI 26.15 kg/m2  SpO2 96% VS noted,  Constitutional: Pt appears in no significant distress HENT: Head: NCAT.  Right Ear: External ear normal.  Left Ear: External ear normal.  Eyes: . Pupils are equal, round, and reactive to light. Conjunctivae and EOM are normal Neck: Normal range of motion. Neck supple.  Cardiovascular: Normal rate and regular rhythm.   Pulmonary/Chest: Effort normal and breath sounds without rales or wheezing.  Abd:  Soft, NT, ND, + BS Neurological: Pt is alert. Not confused , motor grossly intact Skin: Skin is warm. No rash, no LE edema Psychiatric: Pt behavior is normal. No agitation.     Assessment & Plan:

## 2015-02-20 NOTE — Assessment & Plan Note (Addendum)
stable overall by history and exam, recent data reviewed with pt, and pt to continue medical treatment as before,  to f/u any worsening symptoms or concerns Lab Results  Component Value Date   WBC 4.4 08/17/2014   HGB 14.2 08/17/2014   HCT 41.6 08/17/2014   PLT 260.0 08/17/2014   GLUCOSE 181* 12/25/2014   CHOL 160 08/17/2014   TRIG 69.0 08/17/2014   HDL 54.80 08/17/2014   LDLCALC 91 08/17/2014   ALT 16 08/17/2014   AST 16 08/17/2014   NA 139 12/25/2014   K 3.9 12/25/2014   CL 102 12/25/2014   CREATININE 0.96 12/25/2014   BUN 13 12/25/2014   CO2 29 12/25/2014   TSH 2.09 08/17/2014   HGBA1C 9.0* 08/17/2014   BP Readings from Last 3 Encounters:  02/20/15 116/70  12/25/14 118/84  11/03/14 115/70

## 2015-02-20 NOTE — Patient Instructions (Addendum)
Please continue all other medications as before, and refills have been done if requested.  Please have the pharmacy call with any other refills you may need.  Please continue your efforts at being more active, low cholesterol diet, and weight control.  You are otherwise up to date with prevention measures today.  You will be contacted regarding the referral for: Allergy  Please keep your appointments with your specialists as you may have planned  Please return in 6 months, or sooner if needed, with Lab testing done 3-5 days before

## 2015-02-20 NOTE — Progress Notes (Signed)
Pre visit review using our clinic review tool, if applicable. No additional management support is needed unless otherwise documented below in the visit note. 

## 2015-02-21 ENCOUNTER — Encounter: Payer: Self-pay | Admitting: Neurology

## 2015-02-21 ENCOUNTER — Ambulatory Visit (INDEPENDENT_AMBULATORY_CARE_PROVIDER_SITE_OTHER): Payer: BC Managed Care – PPO | Admitting: Neurology

## 2015-02-21 VITALS — BP 122/72 | HR 88 | Resp 20 | Ht 67.5 in | Wt 163.0 lb

## 2015-02-21 DIAGNOSIS — G2119 Other drug induced secondary parkinsonism: Secondary | ICD-10-CM | POA: Insufficient documentation

## 2015-02-21 NOTE — Progress Notes (Signed)
  PATIENT: Aminat A Creason DOB: 03/08/1960  REASON FOR VISIT: follow up- neuroleptic-induced parkinsonism, POTS, diabetic autonomic neuropathy HISTORY FROM: patient  HISTORY OF PRESENT ILLNESS:  CD Mrs. Melling is a 55-year-old female with a history of neuroleptic-induced parkinsonism, POTS and diabetic autonomic neuropathy. She returns today for follow-up. Overall the patient states she's been doing well. She continues to have a mild tremor in both hands. Denies any significant changes with her gait or balance. Denies any falls. She denies any troubles swallowing. He continues to take Risperdal. She was briefly started on Geodon. She states that since starting Geodon she feels that her memory has been affected. She states that she can't remember things people told her a day ago. She is able to complete all ADLs independently. She operates a motor vehicle without difficulty. The patient uses Geodon and Risperdal to treat bipolar disorder and depression. She returns today for an evaluation.  HISTORY 12/28/13: Ms. Tanksley is a 55 year old female with a history of neuroleptic-induces parkinsonism, POTS and diabetic autonomic neuropathy. She returns today for follow-up. She is  taking Latuda and wellbutrin and tolerating it well. She feels that it has helped with the depression. She is no longer on viibyrd. She has a mild tremor in both hands. She continues to take Risperdal. Denies any trouble with her gait or balance. She fell last week due to orthostatic hypotension. She continues to take Ambien but it is no longer beneficial. She states that it takes a while for her to fall asleep but once asleep she does not wake up. It can take her 2-3 hours to fall asleep. The patient's psychiatrist is aware that the Ambien is no longer beneficial. The patient states that her psychiatrist did not want to change the medication this time.  HISTORY 12-12-15 : 54 year old female with a history of Neuroleptic-induced  Parkinsonism, postural orthostatic tachycardia syndrome and diabetic autonomic neuropathy. History of juvenile diabetes, used to work as a sports instructor,PE teacher. Now retired/ disabled. Formerly a patient of Dr Stafford.  The patient is currently being seen by psychiatry and is on Risperdal.  In the past she has tried to wean off this medication but was not successful. She is currently on viibyrd and is tolerating it well. She reports that this has helped her depression and is better tolerated. Since her last visit she has remained stable. She continues to have a mild tremor in both hands. She reports having almost fallen when she tried to walk on the floating beach sand after a swim. . Denies trouble swallowing. She does not present dysphonic.  Patient continues to have near syncopal episodes but it is better with the pindolol.  She is having trouble sleeping. States that it can take up to 3 hours for her to fall sleep. Then she will wake up frequently. She is unsure if the Ambien is helping.  Patient does watch TV in bed. Feels like she is watching the clock all night. Denies daytime sleepiness. No new medical issues since the last visit.   REVIEW OF SYSTEMS: Out of a complete 14 system review of symptoms, the patient complains only of the following symptoms, and all other reviewed systems are negative.  Eye discharge, eye itching, runny nose, restless leg, insomnia, frequent waking, snoring, walking difficulty, neck pain, environmental allergies, and memory loss, tremors, passing out, agitation, confusion, decreased concentration, depression, nervous ALLERGIES: Allergies  Allergen Reactions  . Nsaids Other (See Comments)    Irritates stomach  .   Sulfonamide Derivatives Rash    HOME MEDICATIONS: Outpatient Prescriptions Prior to Visit  Medication Sig Dispense Refill  . atorvastatin (LIPITOR) 40 MG tablet TAKE 1 TABLET BY MOUTH EVERY DAY 30 tablet 0  . BELSOMRA 20 MG TABS Take 1 tablet  by mouth at bedtime.  1  . clonazePAM (KLONOPIN) 1 MG tablet Take 1 mg by mouth 2 (two) times daily as needed for anxiety.    . fludrocortisone (FLORINEF) 0.1 MG tablet Take one half tablet by mouth twice daily at 6AM and 2PM 30 tablet 11  . glucose blood test strip 1 each by Other route. 7 times per day   - as instructed    . NOVOLOG 100 UNIT/ML injection Insulin pump    . pindolol (VISKEN) 5 MG tablet TAKE 1 TABLET (5 MG TOTAL) BY MOUTH 2 (TWO) TIMES DAILY. 180 tablet 3  . risperidone (RISPERDAL) 4 MG tablet Take 8 mg by mouth at bedtime.  0  . ziprasidone (GEODON) 80 MG capsule Take 80 mg by mouth 3 (three) times daily.      No facility-administered medications prior to visit.    PAST MEDICAL HISTORY: Past Medical History  Diagnosis Date  . Rotator cuff syndrome of left shoulder   . S/P partial hysterectomy   . Addiction, opium (HCC)   . Therapy     on therapy by psychiatrist using sebutex   . Parkinsonism (HCC)     due to prolonged prozac/risperdl for yrs/Dr Dohmeir  . IBS (irritable bowel syndrome) 02/15/2011  . Bipolar disorder (HCC) 02/15/2011  . Anxiety     ADHD CHRONIC ANXIETY  . Depression   . Diabetes mellitus     sees Dr Altheimer/endo  . Thyroid nodule     s/p biopsy - benign approx jan 2012  . Insomnia 02/21/2011  . Subacute dyskinesia due to drugs(333.85)     dr /neuro  . Resting tremor   . Cervical spondylosis without myelopathy   . Orthostatic hypotension   . Allergy     spring  . Hyperlipidemia   . Dysautonomia 10/24/2011  . Tremor   . Parkinsonism (HCC)   . 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 (HCC)     opium  . Orthostatic hypotension   . Dyskinesia      subacute,due to drugs  . Spondylosis without myelopathy   . POTS (postural orthostatic tachycardia syndrome)   . Tachycardia     syndrome  . Dyslipidemia   . Neuropathy (HCC)     with paresthesias in feet  . Radiculopathy     cervical  . Multinodular thyroid     with large dominant solid nodule with  calcifications in the lower left pole benign by FNA in 1/12 and stable by repeat ulltrasound in 4/13  . Depression   . 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 History of nuclear stress test     Myoview 11/16: EF 81%, no ischemia; Low Risk    PAST SURGICAL HISTORY: Past Surgical History  Procedure Laterality Date  . Oophorectomy    . Herniated disc cervical spine    . Breast lumpectomy Right   . Spine surgery  feb 2012    c5-6 fusion, Dr Brooks  . Lower back  1992  . Thyroid lobectomy  11/11    nodule biopsy, benign  . Partial hysterectomy    . Acromioplasty Left 2002    Dr Gioffre    FAMILY HISTORY: Family History  Problem Relation Age of Onset  . Lung   cancer Mother   . Heart disease Father   . Hypertension Father   . Heart disease Paternal Grandmother   . ALS Paternal Grandmother   . Hypertension Sister     SOCIAL HISTORY: Social History   Social History  . Marital Status: Single    Spouse Name: N/A  . Number of Children: 0  . Years of Education: BA   Occupational History  . TEACHER [employer]   Social History Main Topics  . Smoking status: Never Smoker   . Smokeless tobacco: Never Used  . Alcohol Use: No  . Drug Use: No  . Sexual Activity: No   Other Topics Concern  . Not on file   Social History Narrative   Patient is single and lives with my partner (Annette Holern).   Patient is disabled.   Patient is right-handed.   Patient has a Bachelors degree.   Patient drinks one soda every other day.      PHYSICAL EXAM  Filed Vitals:   02/21/15 1529  BP: 122/72  Pulse: 88  Resp: 20  Height: 5' 7.5" (1.715 m)  Weight: 163 lb (73.936 kg)   Body mass index is 25.14 kg/(m^2).  Generalized: Well developed, in no acute distress   Neurological examination  Mentation: Alert oriented to time, place, history taking. Follows all commands speech and language fluent. MMSE 29/30,  Montreal Cognitive Assessment  02/21/2015    Visuospatial/ Executive (0/5) 5  Naming (0/3) 3  Attention: Read list of digits (0/2) 2  Attention: Read list of letters (0/1) 1  Attention: Serial 7 subtraction starting at 100 (0/3) 3  Language: Repeat phrase (0/2) 2  Language : Fluency (0/1) 0  Abstraction (0/2) 2  Delayed Recall (0/5) 3  Orientation (0/6) 5  Total 26  Adjusted Score (based on education) 26    Cranial nerve: no changes in taste or smell. Pupils were equal round reactive to light. Extraocular movements were full,  With nystagmus. She seems to have a restricted facial movement. visual field were full on confrontational test. Facial sensation and strength were normal. Uvula tongue midline. Head turning and shoulder shrug  were normal and symmetric. Motor: The motor testing reveals 4/5 strength of all 4 extremities. Elevated motor tone is noted throughout.  Sensory: Sensory testing is intact to soft touch on all 4 extremities. She reports no plantar numbness no pin and needle dysesthesias in her feet, the feet do not feel cold.  No evidence of extinction is noted.  Coordination: Cerebellar testing reveals good finger-nose-finger and heel-to-shin bilaterally.  Gait and station: Gait without armswing, bilaterally trembling hand and fingers, not pill rolling tremor.   Tandem gait is unsteady. Romberg is positive . No drift is seen.  Reflexes: Deep tendon reflexes are symmetric and normal bilaterally.   DIAGNOSTIC DATA (LABS, IMAGING, TESTING) - I reviewed patient records, labs, notes, testing and imaging myself where available.  Lab Results  Component Value Date   WBC 4.4 08/17/2014   HGB 14.2 08/17/2014   HCT 41.6 08/17/2014   MCV 94.0 08/17/2014   PLT 260.0 08/17/2014      Component Value Date/Time   NA 139 12/25/2014 1442   K 3.9 12/25/2014 1442   CL 102 12/25/2014 1442   CO2 29 12/25/2014 1442   GLUCOSE 181* 12/25/2014 1442   BUN 13 12/25/2014 1442   CREATININE 0.96 12/25/2014 1442   CREATININE 0.92  08/17/2014 1522   CALCIUM 9.3 12/25/2014 1442   PROT 6.5 08/17/2014 1522     ALBUMIN 3.8 08/17/2014 1522   AST 16 08/17/2014 1522   ALT 16 08/17/2014 1522   ALKPHOS 64 08/17/2014 1522   BILITOT 0.4 08/17/2014 1522   GFRNONAA >60 03/22/2010 1251   GFRAA  03/22/2010 1251    >60        The eGFR has been calculated using the MDRD equation. This calculation has not been validated in all clinical situations. eGFR's persistently <60 mL/min signify possible Chronic Kidney Disease.   Lab Results  Component Value Date   CHOL 160 08/17/2014   HDL 54.80 08/17/2014   LDLCALC 91 08/17/2014   TRIG 69.0 08/17/2014   CHOLHDL 3 08/17/2014   Lab Results  Component Value Date   HGBA1C 9.0* 08/17/2014   Lab Results  Component Value Date   VITAMINB12 944* 08/17/2014   Lab Results  Component Value Date   TSH 2.09 08/17/2014      ASSESSMENT AND PLAN 54 y.o. year old female  has a past medical history of Rotator cuff syndrome of left shoulder; S/P partial hysterectomy; Addiction, opium (HCC); Therapy; Parkinsonism (HCC); IBS (irritable bowel syndrome) (02/15/2011); Bipolar disorder (HCC) (02/15/2011); Anxiety; Depression; Diabetes mellitus; Thyroid nodule; Insomnia (02/21/2011); Subacute dyskinesia due to drugs(333.85); Resting tremor; Cervical spondylosis without myelopathy; Orthostatic hypotension; Allergy; Hyperlipidemia; Dysautonomia (10/24/2011); Tremor; Parkinsonism (HCC); 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 (HCC); Orthostatic hypotension; Dyskinesia; Spondylosis without myelopathy; POTS (postural orthostatic tachycardia syndrome); Tachycardia; Dyslipidemia; Neuropathy (HCC); Radiculopathy; Multinodular thyroid; Depression; 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 here with:  1. Parkinsonism, with gait impairment  2. Diabetic dysautonomia, POTS ?  3. Memory disturbance, MOCA 26-30 . MMSE 29/30   Overall the patient has remained stable. We have spend 25 minutes in the  prolonged RV with memory testing,  More than 505 of this visit was face to face with coordination of care , discussion of symptoms and  Future developments to be expected. cardiac and VS evaluation and  Onset of  leg pain.  Her rigidity and pain in all extremities have worsened.   She had developed oneadditional symptoms of parkinsonism- she has a much harder time not falling when she walks on uneven surface. She explained an example when she was at the beach trying to leave the ocean water and the sound was moving under her feet that it was very difficult for her to stand up and actually walk.  This is consistent with a gait disorder given her gait instability and neuropathy.  . The patient does feel that her memory has been affected. Her MMSE today is 29/30. The MOCA was 26/30 . This is in normal limits.   I have advised the patient to talk to her psychiatrist and inform them that since starting the Geodon she feels that her cognition has been affected.   Patient verbalized understanding. If her symptoms worsen or she develops new symptoms she should let us know.  Otherwise she will follow-up in 2 months with me, Dr. . I want to see her as my last appointment of the day.     , MD   02/21/2015, 3:56 PM [facility] [address], [address] [place], [place] [address] [phone]  Cc Dr. James John , MD  

## 2015-04-26 ENCOUNTER — Ambulatory Visit (INDEPENDENT_AMBULATORY_CARE_PROVIDER_SITE_OTHER): Payer: BC Managed Care – PPO | Admitting: Neurology

## 2015-04-26 ENCOUNTER — Encounter: Payer: Self-pay | Admitting: Neurology

## 2015-04-26 VITALS — BP 102/60 | Resp 20 | Ht 67.0 in | Wt 152.0 lb

## 2015-04-26 DIAGNOSIS — E1043 Type 1 diabetes mellitus with diabetic autonomic (poly)neuropathy: Secondary | ICD-10-CM

## 2015-04-26 DIAGNOSIS — R000 Tachycardia, unspecified: Secondary | ICD-10-CM

## 2015-04-26 DIAGNOSIS — G20 Parkinson's disease: Secondary | ICD-10-CM | POA: Insufficient documentation

## 2015-04-26 DIAGNOSIS — G903 Multi-system degeneration of the autonomic nervous system: Secondary | ICD-10-CM

## 2015-04-26 DIAGNOSIS — F333 Major depressive disorder, recurrent, severe with psychotic symptoms: Secondary | ICD-10-CM

## 2015-04-26 DIAGNOSIS — I951 Orthostatic hypotension: Secondary | ICD-10-CM | POA: Diagnosis not present

## 2015-04-26 DIAGNOSIS — G239 Degenerative disease of basal ganglia, unspecified: Secondary | ICD-10-CM | POA: Diagnosis not present

## 2015-04-26 DIAGNOSIS — G90A Postural orthostatic tachycardia syndrome (POTS): Secondary | ICD-10-CM

## 2015-04-26 NOTE — Progress Notes (Signed)
PATIENT: Lynn Barnes DOB: Nov 25, 1960  REASON FOR VISIT: follow up- neuroleptic-induced parkinsonism, POTS, diabetic autonomic neuropathy HISTORY FROM: patient and partner   HISTORY OF PRESENT ILLNESS:  Lynn Barnes is a 55 year old female with a history of neuroleptic-induced parkinsonism, POTS and diabetic autonomic neuropathy. She returns today for follow-up. Overall the patient states she's been doing well. She continues to have a mild tremor in both hands. Denies any significant changes with her gait or balance. Denies any falls. She denies any troubles swallowing. He continues to take Risperdal. She was briefly started on Geodon. She states that since starting Geodon she feels that her memory has been affected. She states that she can't remember things people told her a day ago. She is able to complete all ADLs independently. She operates a Teacher, music without difficulty. The patient uses Geodon and Risperdal to treat bipolar disorder and depression. She returns today for an evaluation.  HISTORY 12/28/13: Lynn Barnes is a 55 year old female with a history of neuroleptic-induces parkinsonism, POTS and diabetic autonomic neuropathy. She returns today for follow-up. She is  taking Latuda and wellbutrin and tolerating it well. She feels that it has helped with the depression. She is no longer on viibyrd. She has a mild tremor in both hands. She continues to take Risperdal. Denies any trouble with her gait or balance. She fell last week due to orthostatic hypotension. She continues to take Ambien but it is no longer beneficial. She states that it takes a while for her to fall asleep but once asleep she does not wake up. It can take her 2-3 hours to fall asleep. The patient's psychiatrist is aware that the Ambien is no longer beneficial. The patient states that her psychiatrist did not want to change the medication this time.  HISTORY 12-12-15 : 55 year old female with a history of  Neuroleptic-induced Parkinsonism, postural orthostatic tachycardia syndrome and diabetic autonomic neuropathy. 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 Formerly a patient of Dr Joni Fears.  The patient is currently being seen by psychiatry and is on Risperdal.  In the past she has tried to wean off this medication but was not successful. She is currently on viibyrd and is tolerating it well. She reports that this has helped her depression and is better tolerated. Since her last visit she has remained stable. She continues to have a mild tremor in both hands. She reports having almost fallen when she tried to walk on the floating beach sand after a swim. . Denies trouble swallowing. She does not present dysphonic.   04-26-2015 Patient continues to have near syncopal episodes but it is better with the pindolol.  She is having trouble sleeping. States that it can take up to 3 hours for her to fall sleep. Then she will wake up frequently. She is unsure if the Ambien is helping.  Patient does watch TV in bed. Feels like she is watching the clock all night. Denies daytime sleepiness. No new medical issues since the last visit.   Lynn Barnes does have a masked face with less facial mimic, she does have a baseline and elevated muscle tone but no cogwheeling she shuffles when she walks, she sits very still is no sign of restless legs or fidgetiness. She provided good grip strength. She reports that she sometimes drools at night I think these are all long-term effects of her antipsychotic treatment but they have not left to  a tardive dyskinesia., There is also possibility of Reglan causing some of the symptoms. 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 She does have a diabetic autonomic neuropathy. I discussed today that we may want to consider nuplazid, and I will ask Jill Poling to comment on this. She has been less  depressed and fairly well controlled on Geodon and Risperdal. her memory testing revealed subjective feeling of memory loss rather than objective signs of it.  REVIEW OF SYSTEMS: Out of a complete 14 system review of symptoms, the patient complains only of the following symptoms, and all other reviewed systems are negative.  Eye discharge, eye itching, runny nose, restless leg, insomnia, frequent waking, snoring, walking difficulty, neck pain, environmental allergies, and memory loss, tremors, passing out, agitation, confusion, decreased concentration, depression, nervous ALLERGIES: Allergies  Allergen Reactions  . Nsaids Other (See Comments)    Irritates stomach  . Sulfonamide Derivatives Rash    HOME MEDICATIONS: Outpatient Prescriptions Prior to Visit  Medication Sig Dispense Refill  . atorvastatin (LIPITOR) 40 MG tablet TAKE 1 TABLET BY MOUTH EVERY DAY 30 tablet 0  . clonazePAM (KLONOPIN) 1 MG tablet Take 1 mg by mouth 2 (two) times daily as needed for anxiety.    . fludrocortisone (FLORINEF) 0.1 MG tablet Take one half tablet by mouth twice daily at 6AM and 2PM 30 tablet 11  . glucose blood test strip 1 each by Other route. 7 times per day   - as instructed    . NOVOLOG 100 UNIT/ML injection Insulin pump    . pindolol (VISKEN) 5 MG tablet TAKE 1 TABLET (5 MG TOTAL) BY MOUTH 2 (TWO) TIMES DAILY. 180 tablet 3  . risperidone (RISPERDAL) 4 MG tablet Take 8 mg by mouth at bedtime.  0  . ziprasidone (GEODON) 80 MG capsule Take 80 mg by mouth 3 (three) times daily.     . BELSOMRA 20 MG TABS Take 1 tablet by mouth at bedtime.  1   No facility-administered medications prior to visit.    PAST MEDICAL HISTORY: Past Medical History  Diagnosis Date  . Rotator cuff syndrome of left shoulder   . S/P partial hysterectomy   . Addiction, opium ([place])   . Therapy     on therapy by psychiatrist using sebutex   . Parkinsonism ([place])     due to prolonged prozac/risperdl for yrs/Dr Dohmeir  . IBS  (irritable bowel syndrome) 02/15/2011  . Bipolar disorder ([place]) 02/15/2011  . Anxiety     ADHD CHRONIC ANXIETY  . Depression   . Diabetes mellitus     sees Dr Altheimer/endo  . Thyroid nodule     s/p biopsy - benign approx jan 2012  . Insomnia 02/21/2011  . Subacute dyskinesia due to drugs(333.85)     dr Rikia Sukhu/neuro  . Resting tremor   . Cervical spondylosis without myelopathy   . Orthostatic hypotension   . Allergy     spring  . Hyperlipidemia   . Dysautonomia 10/24/2011  . Tremor   . Parkinsonism ([place])   . 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 ([place])     opium  . Orthostatic hypotension   . Dyskinesia      subacute,due to drugs  . Spondylosis without myelopathy   . POTS (postural orthostatic tachycardia syndrome)   . Tachycardia     syndrome  . Dyslipidemia   . Neuropathy ([facility])     with paresthesias in feet  . Radiculopathy     cervical  . Multinodular thyroid  with large dominant solid nodule with calcifications in the lower left pole benign by FNA in 1/12 and stable by repeat ulltrasound in 4/13  . Depression   . 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 ([place])   . History of nuclear stress test     Myoview 11/16: EF 81%, no ischemia; Low Risk    PAST SURGICAL HISTORY: Past Surgical History  Procedure Laterality Date  . Oophorectomy    . Herniated disc cervical spine    . Breast lumpectomy Right   . Spine surgery  feb 2012    c5-6 fusion, Dr Rolena Infante  . Lower back  1992  . Thyroid lobectomy  11/11    nodule biopsy, benign  . Partial hysterectomy    . Acromioplasty Left 2002    Dr Gladstone Lighter    FAMILY HISTORY: Family History  Problem Relation Age of Onset  . Lung cancer Mother   . Heart disease Father   . Hypertension Father   . Heart disease Paternal Grandmother   . ALS Paternal Grandmother   . Hypertension Sister     SOCIAL HISTORY: Social History   Social History  . Marital Status: Single    Spouse Name: N/A  . Number of Children: 0  . Years of Education: BA    Occupational History  . [employer]   Social History Main Topics  . Smoking status: Never Smoker   . Smokeless tobacco: Never Used  . Alcohol Use: No  . Drug Use: No  . Sexual Activity: No   Other Topics Concern  . Not on file   Social History Narrative   Patient is single and lives with my partner ([place]).   Patient is disabled.   Patient is right-handed.   Patient has a Haematologist.   Patient drinks one soda every other day.      PHYSICAL EXAM  Filed Vitals:   04/26/15 1540  BP: 102/60  Resp: 20  Height: 5' 7"  (1.702 m)  Weight: 152 lb (68.947 kg)   Body mass index is 23.8 kg/(m^2).  Generalized: Well developed, in no acute distress   Neurological examination  Mentation: Alert oriented to time, place, history taking. Follows all commands speech and language fluent. MMSE 29/30,  Montreal Cognitive Assessment  02/21/2015  Visuospatial/ Executive (0/5) 5  Naming (0/3) 3  Attention: Read list of digits (0/2) 2  Attention: Read list of letters (0/1) 1  Attention: Serial 7 subtraction starting at 100 (0/3) 3  Language: Repeat phrase (0/2) 2  Language : Fluency (0/1) 0  Abstraction (0/2) 2  Delayed Recall (0/5) 3  Orientation (0/6) 5  Total 26  Adjusted Score (based on education) 26    Cranial nerve: no changes in taste or smell. Pupils were equal round reactive to light. Extraocular movements were full,  With nystagmus. She seems to have a restricted facial movement. visual field were full on confrontational test. Facial sensation and strength were normal. Uvula tongue midline. Head turning and shoulder shrug  were normal and symmetric. Motor: The motor testing reveals 4/5 strength of all 4 extremities. Elevated motor tone is noted throughout.  Sensory: Sensory testing is intact to soft touch on all 4 extremities. She reports no plantar numbness no pin and needle dysesthesias in her feet, the feet do not feel cold.  No evidence  of extinction is noted.  Coordination: Cerebellar testing reveals good finger-nose-finger and heel-to-shin bilaterally.  Gait and station: Gait without armswing, bilaterally trembling hand and fingers,  not pill rolling tremor.   Tandem gait is unsteady. Romberg is positive . No drift is seen.  Reflexes: Deep tendon reflexes are symmetric and normal bilaterally.   DIAGNOSTIC DATA (LABS, IMAGING, TESTING) - I reviewed patient records, labs, notes, testing and imaging myself where available.  Lab Results  Component Value Date   WBC 4.4 08/17/2014   HGB 14.2 08/17/2014   HCT 41.6 08/17/2014   MCV 94.0 08/17/2014   PLT 260.0 08/17/2014      Component Value Date/Time   NA 139 12/25/2014 1442   K 3.9 12/25/2014 1442   CL 102 12/25/2014 1442   CO2 29 12/25/2014 1442   GLUCOSE 181* 12/25/2014 1442   BUN 13 12/25/2014 1442   CREATININE 0.96 12/25/2014 1442   CREATININE 0.92 08/17/2014 1522   CALCIUM 9.3 12/25/2014 1442   PROT 6.5 08/17/2014 1522   ALBUMIN 3.8 08/17/2014 1522   AST 16 08/17/2014 1522   ALT 16 08/17/2014 1522   ALKPHOS 64 08/17/2014 1522   BILITOT 0.4 08/17/2014 1522   GFRNONAA >60 03/22/2010 1251   GFRAA  03/22/2010 1251    >60        The eGFR has been calculated using the MDRD equation. This calculation has not been validated in all clinical situations. eGFR's persistently <60 mL/min signify possible Chronic Kidney Disease.   Lab Results  Component Value Date   CHOL 160 08/17/2014   HDL 54.80 08/17/2014   LDLCALC 91 08/17/2014   TRIG 69.0 08/17/2014   CHOLHDL 3 08/17/2014   Lab Results  Component Value Date   HGBA1C 9.0* 08/17/2014   Lab Results  Component Value Date   VITAMINB12 944* 08/17/2014   Lab Results  Component Value Date   TSH 2.09 08/17/2014      ASSESSMENT AND PLAN 55 y.o. year old female, 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 ([place]) [E11.43]  1. Secondary Parkinsonism, with gait impairment - NUPLAZID may be an option.  Magda Paganini  [place]  2. Diabetic dysautonomia, POTS ?  3. Subjective Memory disturbance, MOCA 26-30 . MMSE 29/30  4. Treated with antipsychotics for major depression. Begun dragging her feet. Mood is stabilized.   Overall the patient has remained stable. I have spend 25 minutes in the prolonged RV with memory testing,   More than 50% of this visit was face to face with coordination of care , discussion of symptoms and future developments to be expected. cardiac .  Her rigidity and pain in all extremities have worsened.   She had developed oneadditional symptoms of parkinsonism- she has a much harder time not falling when she walks on uneven surface. She explained an example when she was at the beach trying to leave the ocean water and the sound was moving under her feet that it was very difficult for her to stand up and actually walk.  This is consistent with a gait disorder given her gait instability and neuropathy. 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 [facility]) [E11.43] The patient does feel that her memory has been affected. Her MMSE today is 29/30. The [place] was 26/30 . This is in normal limits.    Lynn Seat, MD   04/26/2015, 3:43 PM [facility] [address] [place], [place] [address] [phone]  Cc Dr. Cathlean Cower , MD  Jill Poling, psychiatry

## 2015-04-26 NOTE — Patient Instructions (Signed)
Diabetic Neuropathy Diabetic neuropathy is a nerve disease or nerve damage that is caused by diabetes mellitus. About half of all people with diabetes mellitus have some form of nerve damage. Nerve damage is more common in those who have had diabetes mellitus for many years and who generally have not had good control of their blood sugar (glucose) level. Diabetic neuropathy is a common complication of diabetes mellitus. There are three common types of diabetic neuropathy and a fourth type that is less common and less understood:   Peripheral neuropathy--This is the most common type of diabetic neuropathy. It causes damage to the nerves of the feet and legs first and then eventually the hands and arms.The damage affects the ability to sense touch.  Autonomic neuropathy--This type causes damage to the autonomic nervous system, which controls the following functions:  Heartbeat.  Body temperature.  Blood pressure.  Urination.  Digestion.  Sweating.  Sexual function.  Focal neuropathy--Focal neuropathy can be painful and unpredictable and occurs most often in older adults with diabetes mellitus. It involves a specific nerve or one area and often comes on suddenly. It usually does not cause long-term problems.  Radiculoplexus neuropathy-- Sometimes called lumbosacral radiculoplexus neuropathy, radiculoplexus neuropathy affects the nerves of the thighs, hips, buttocks, or legs. It is more common in people with type 2 diabetes mellitus and in older men. It is characterized by debilitating pain, weakness, and atrophy, usually in the thigh muscles. CAUSES  The cause of peripheral, autonomic, and focal neuropathies is diabetes mellitus that is uncontrolled and high glucose levels. The cause of radiculoplexus neuropathy is unknown. However, it is thought to be caused by inflammation related to uncontrolled glucose levels. SIGNS AND SYMPTOMS  Peripheral Neuropathy Peripheral neuropathy develops  slowly over time. When the nerves of the feet and legs no longer work there may be:   Burning, stabbing, or aching pain in the legs or feet.  Inability to feel pressure or pain in your feet. This can lead to:  Thick calluses over pressure areas.  Pressure sores.  Ulcers.  Foot deformities.  Reduced ability to feel temperature changes.  Muscle weakness. Autonomic Neuropathy The symptoms of autonomic neuropathy vary depending on which nerves are affected. Symptoms may include:  Problems with digestion, such as:  Feeling sick to your stomach (nausea).  Vomiting.  Bloating.  Constipation.  Diarrhea.  Abdominal pain.  Difficulty with urination. This occurs if you lose your ability to sense when your bladder is full. Problems include:  Urine leakage (incontinence).  Inability to empty your bladder completely (retention).  Rapid or irregular heartbeat (palpitations).  Blood pressure drops when you stand up (orthostatic hypotension). When you stand up you may feel:  Dizzy.  Weak.  Faint.  In men, inability to attain and maintain an erection.  In women, vaginal dryness and problems with decreased sexual desire and arousal.  Problems with body temperature regulation.  Increased or decreased sweating. Focal Neuropathy  Abnormal eye movements or abnormal alignment of both eyes.  Weakness in the wrist.  Foot drop. This results in an inability to lift the foot properly and abnormal walking or foot movement.  Paralysis on one side of your face (Bell palsy).  Chest or abdominal pain. Radiculoplexus Neuropathy  Sudden, severe pain in your hip, thigh, or buttocks.  Weakness and wasting of thigh muscles.  Difficulty rising from a seated position.  Abdominal swelling.  Unexplained weight loss (usually more than 10 lb [4.5 kg]). DIAGNOSIS  Peripheral Neuropathy Your senses may be   tested. Sensory function testing can be done with:  A light touch using a  monofilament.  A vibration with tuning fork.  A sharp sensation with a pin prick. Other tests that can help diagnose neuropathy are:  Nerve conduction velocity. This test checks the transmission of an electrical current through a nerve.  Electromyography. This shows how muscles respond to electrical signals transmitted by nearby nerves.  Quantitative sensory testing. This is used to assess how your nerves respond to vibrations and changes in temperature. Autonomic Neuropathy Diagnosis is often based on reported symptoms. Tell your health care provider if you experience:   Dizziness.   Constipation.   Diarrhea.   Inappropriate urination or inability to urinate.   Inability to get or maintain an erection.  Tests that may be done include:   Electrocardiography or Holter monitor. These are tests that can help show problems with the heart rate or heart rhythm.   An X-ray exam may be done. Focal Neuropathy Diagnosis is made based on your symptoms and what your health care provider finds during your exam. Other tests may be done. They may include:  Nerve conduction velocities. This checks the transmission of electrical current through a nerve.  Electromyography. This shows how muscles respond to electrical signals transmitted by nearby nerves.  Quantitative sensory testing. This test is used to assess how your nerves respond to vibration and changes in temperature. Radiculoplexus Neuropathy  Often the first thing is to eliminate any other issue or problems that might be the cause, as there is no stick test for diagnosis.  X-ray exam of your spine and lumbar region.  Spinal tap to rule out cancer.  MRI to rule out other lesions. TREATMENT  Once nerve damage occurs, it cannot be reversed. The goal of treatment is to keep the disease or nerve damage from getting worse and affecting more nerve fibers. Controlling your blood glucose level is the key. Most people with  radiculoplexus neuropathy see at least a partial improvement over time. You will need to keep your blood glucose and HbA1c levels in the target range determined by your health care provider. Things that help control blood glucose levels include:   Blood glucose monitoring.   Meal planning.   Physical activity.   Diabetes medicine.  Over time, maintaining lower blood glucose levels helps lessen symptoms. Sometimes, prescription pain medicine is needed. HOME CARE INSTRUCTIONS:  Do not smoke.  Keep your blood glucose level in the range that you and your health care provider have determined acceptable for you.  Keep your blood pressure level in the range that you and your health care provider have determined acceptable for you.  Eat a well-balanced diet.  Be physically active every day. Include strength training and balance exercises.  Protect your feet.  Check your feet every day for sores, cuts, blisters, or signs of infection.  Wear padded socks and supportive shoes. Use orthotic inserts, if necessary.  Regularly check the insides of your shoes for worn spots. Make sure there are no rocks or other items inside your shoes before you put them on. SEEK MEDICAL CARE IF:   You have burning, stabbing, or aching pain in the legs or feet.  You are unable to feel pressure or pain in your feet.  You develop problems with digestion such as:  Nausea.  Vomiting.  Bloating.  Constipation.  Diarrhea.  Abdominal pain.  You have difficulty with urination, such as:  Incontinence.  Retention.  You have palpitations.  You   develop orthostatic hypotension. When you stand up you may feel:  Dizzy.  Weak.  Faint.  You cannot attain and maintain an erection (in men).  You have vaginal dryness and problems with decreased sexual desire and arousal (in women).  You have severe pain in your thighs, legs, or buttocks.  You have unexplained weight loss.   This information  is not intended to replace advice given to you by your health care provider. Make sure you discuss any questions you have with your health care provider.   Document Released: 04/07/2001 Document Revised: 02/17/2014 Document Reviewed: 07/08/2012 Elsevier Interactive Patient Education 2016 Elsevier [employer] Postural Orthostatic Tachycardia Syndrome Postural orthostatic tachycardia syndrome (POTS) is an increased heart rate when going from a lying (supine) position to a standing position. The heart rate may increase more than 30 beats per minute (BPM) above its resting rate when going from a lying to a standing position. POTS occurs more frequently in women than in men.  SYMPTOMS  POTS symptoms may be increased in the morning. Symptoms of POTS include:  Fainting or near fainting.  Inability to think clearly.  Extreme or chronic fatigue.  Exercise intolerance.  Chest pain.  Having the lower legs develop a reddish-blue color due to decreased blood flow (acrocyanosis). CAUSES POTS can be caused by different conditions. Sometimes, it has no known cause (idiopathic). Some causes of POTS include:  Viral illness.  Pregnancy.  Autoimmune diseases.  Medications.  Major surgery.  Trauma such as a car accident or major injury.  Medical conditions such as anemia, dehydration, and hyperthyroidism. DIAGNOSIS  POTS is diagnosed by:  Taking a complete history and physical exam.  Measuring the heart rate while lying and then upon standing.  Measuring blood pressure when going from a lying to a standing position. POTS is usually not associated with low blood pressure (orthostatic hypotension) when going from a lying to standing position. While standing, blood pressure should be taken 2, 5, and 10 minutes after getting up. TREATMENT  Treatment of POTS depends upon the severity of the symptoms. Treatment includes:  Drinking plenty of fluids to avoid getting dehydrated.  Avoiding very hot  environments to not get overheated.  Increasing your dietary salt intake as instructed by your caregiver.  Taking different types of medications as prescribed for POTS.  Avoiding some classes of medications such as vasodilators and diuretics. SEEK IMMEDIATE MEDICAL CARE IF  You have severe chest pain that does not go away. Call your local emergency service immediately.  You feel your heart racing or beating rapidly.  You feel like passing out.  You have very confused thinking. MAKE SURE YOU  Understand these instructions.  Will watch your condition.  Will get help right away if you are not doing well or get worse.   This information is not intended to replace advice given to you by your health care provider. Make sure you discuss any questions you have with your health care provider.   Document Released: 01/17/2002 Document Revised: 02/17/2014 Document Reviewed: 03/27/2010 Elsevier Interactive Patient Education 2016 [place] oral tablets What is this medicine? PIMAVANSERIN (Pi ma VAN ser in) is used to treat hallucinations (hearing voices, seeing things that are not there) and delusions (having beliefs that are not true) associated with Parkinson's disease. This medicine may be used for other purposes; ask your health care provider or pharmacist if you have questions. What should I tell my health care provider before I take this medicine? They need to know  if you have any of these conditions: -dementia -heart disease -history of irregular heartbeat -kidney disease -liver disease -low levels of magnesium or potassium in the blood -an unusual or allergic reaction to pimavanserin, other medicines, foods, dyes, or preservatives -pregnant or trying to get pregnant -breast-feeding How should I use this medicine? Take this medicine by mouth with a glass of water. Follow the directions on the prescription label. You can take it with or without food. If it upsets  your stomach, take it with food. Take your medicine at regular intervals. Do not take it more often than directed. Do not stop taking except on your doctor's advice. Talk to your pediatrician regarding the use of this medicine in children. This medicine is not approved for use in children. Overdosage: If you think you have taken too much of this medicine contact a poison control center or emergency room at once. NOTE: This medicine is only for you. Do not share this medicine with others. What if I miss a dose? If you miss a dose, take it as soon as you can. If it is almost time for your next dose, take only that dose. Do not take double or extra doses. What may interact with this medicine? Do not take this medicine with any of the following medications: -certain medicines for fungal infections like fluconazole, itraconazole, ketoconazole, posaconazole, voriconazole -cisapride -dofetilide -dronedarone -pimozide -ritonavir -thioridazine -ziprasidone This medicine may also interact with the following medications: -certain antibiotics like clarithromycin, gatifloxacin, moxifloxacin -certain medicines for irregular heart beat like amiodarone, disopyramide, procainamide, propafenone, sotalol, quinidine -certain medicines for seizures like carbamazepine, phenytoin -indinavir -other medicines for psychotic disturbances -other medicines that prolong the QT interval (cause an abnormal heart rhythm) -rifampin -St. John's Wort This list may not describe all possible interactions. Give your health care provider a list of all the medicines, herbs, non-prescription drugs, or dietary supplements you use. Also tell them if you smoke, drink alcohol, or use illegal drugs. Some items may interact with your medicine. What should I watch for while using this medicine? Visit your doctor or health care professional for regular checks on your progress. Tell your doctor or healthcare professional if your symptoms  do not start to get better or if they get worse. What side effects may I notice from receiving this medicine? Side effects that you should report to your doctor or health care professional as soon as possible: -allergic reactions like skin rash, itching or hives, swelling of the face, lips, or tongue -breathing problems -confusion -increase in hallucination, loss of contact with reality -loss of balance or coordination -signs and symptoms of a dangerous change in heartbeat or heart rhythm like chest pain; dizziness; fast or irregular heartbeat; palpitations; feeling faint or lightheaded, falls; breathing problems -swelling of the ankles, feet, hands Side effects that usually do not require medical attention (Report these to your doctor or health care professional if they continue or are bothersome.): -constipation -fatigue -nausea This list may not describe all possible side effects. Call your doctor for medical advice about side effects. You may report side effects to FDA at 1-800-FDA-1088. Where should I keep my medicine? Keep out of the reach of children. Store at room temperature between 15 and 30 degrees C (59 and 86 degrees F). Throw away any unused medicine after the expiration date. NOTE: This sheet is a summary. It may not cover all possible information. If you have questions about this medicine, talk to your doctor, pharmacist, or health care provider.  2016, Elsevier/Gold Standard. (2014-06-27 15:20:37)

## 2015-06-25 ENCOUNTER — Ambulatory Visit (INDEPENDENT_AMBULATORY_CARE_PROVIDER_SITE_OTHER): Payer: BC Managed Care – PPO | Admitting: Internal Medicine

## 2015-06-25 ENCOUNTER — Encounter: Payer: Self-pay | Admitting: Internal Medicine

## 2015-06-25 VITALS — BP 112/76 | HR 92 | Ht 67.0 in | Wt 152.8 lb

## 2015-06-25 DIAGNOSIS — G901 Familial dysautonomia [Riley-Day]: Secondary | ICD-10-CM

## 2015-06-25 DIAGNOSIS — E785 Hyperlipidemia, unspecified: Secondary | ICD-10-CM

## 2015-06-25 DIAGNOSIS — R55 Syncope and collapse: Secondary | ICD-10-CM | POA: Diagnosis not present

## 2015-06-25 DIAGNOSIS — G909 Disorder of the autonomic nervous system, unspecified: Secondary | ICD-10-CM

## 2015-06-25 MED ORDER — FLUDROCORTISONE ACETATE 0.1 MG PO TABS: ORAL_TABLET | ORAL | Status: DC

## 2015-06-25 NOTE — Progress Notes (Signed)
Cardiology Office Note   Date:  06/25/2015   ID:  Lynn Barnes, DOB [birth year], MRN [id number]  PCP:  Cathlean Cower, MD  Cardiologist:   Dorris Carnes, MD   No chief complaint on file.  PT presents for f/u of dizziness   History of Present Illness: Lynn Barnes is a 55 y.o. female with a history of  drug induced Parkinson's and autonomic dysfunction. She has been followed by Drs. John and Dohrmeir. I saw her in Sept 2016 Pt still  with dizziness   She says it is getting worse   ONe episode of syncope last week  Bent to put check under mat  Stood up and blacked out   Says she is drinking fluids    Outpatient Prescriptions Prior to Visit  Medication Sig Dispense Refill  . atorvastatin (LIPITOR) 40 MG tablet TAKE 1 TABLET BY MOUTH EVERY DAY 30 tablet 0  . clonazePAM (KLONOPIN) 1 MG tablet Take 1 mg by mouth 2 (two) times daily as needed for anxiety.    . fludrocortisone (FLORINEF) 0.1 MG tablet Take one half tablet by mouth twice daily at 6AM and 2PM 30 tablet 11  . glucose blood test strip 1 each by Other route. 7 times per day   - as instructed    . NOVOLOG 100 UNIT/ML injection Insulin pump    . pindolol (VISKEN) 5 MG tablet TAKE 1 TABLET (5 MG TOTAL) BY MOUTH 2 (TWO) TIMES DAILY. 180 tablet 3  . risperidone (RISPERDAL) 4 MG tablet Take 8 mg by mouth at bedtime.  0  . zolpidem (AMBIEN) 10 MG tablet Take 10 mg by mouth at bedtime.  1  . ziprasidone (GEODON) 80 MG capsule Take 80 mg by mouth 3 (three) times daily. Reported on 06/25/2015     No facility-administered medications prior to visit.     Allergies:   Nsaids and Sulfonamide derivatives   Past Medical History  Diagnosis Date  . Rotator cuff syndrome of left shoulder   . S/P partial hysterectomy   . Addiction, opium ([place])   . Therapy     on therapy by psychiatrist using sebutex   . Parkinsonism ([place])     due to prolonged prozac/risperdl for yrs/Dr Dohmeir  . IBS (irritable bowel syndrome) 02/15/2011  . Bipolar  disorder ([place]) 02/15/2011  . Anxiety     ADHD CHRONIC ANXIETY  . Depression   . Diabetes mellitus     sees Dr Altheimer/endo  . Thyroid nodule     s/p biopsy - benign approx jan 2012  . Insomnia 02/21/2011  . Subacute dyskinesia due to drugs(333.85)     dr dohmeier/neuro  . Resting tremor   . Cervical spondylosis without myelopathy   . Orthostatic hypotension   . Allergy     spring  . Hyperlipidemia   . Dysautonomia 10/24/2011  . Tremor   . Parkinsonism ([place])   . 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 ([place])     opium  . Orthostatic hypotension   . Dyskinesia      subacute,due to drugs  . Spondylosis without myelopathy   . POTS (postural orthostatic tachycardia syndrome)   . Tachycardia     syndrome  . Dyslipidemia   . Neuropathy ([facility])     with paresthesias in feet  . Radiculopathy     cervical  . Multinodular thyroid     with large dominant solid nodule with calcifications in the lower left pole benign by FNA in 1/12 and stable by  repeat ulltrasound in 4/13  . Depression   . 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 History of nuclear stress test     Myoview 11/16: EF 81%, no ischemia; Low Risk    Past Surgical History  Procedure Laterality Date  . Oophorectomy    . Herniated disc cervical spine    . Breast lumpectomy Right   . Spine surgery  feb 2012    c5-6 fusion, Dr Rolena Infante  . Lower back  1992  . Thyroid lobectomy  11/11    nodule biopsy, benign  . Partial hysterectomy    . Acromioplasty Left 2002    Dr Gladstone Lighter     Social History:  The patient  reports that she has never smoked. She has never used smokeless tobacco. She reports that she does not drink alcohol or use illicit drugs.   Family History:  The patient's family history includes ALS in her paternal grandmother; Heart disease in her father and paternal grandmother; Hypertension in her father and sister; Lung cancer in her mother.    ROS:  Please see the history of present illness. All other systems are reviewed  and  Negative to the above problem except as noted.    PHYSICAL EXAM: VS:  BP 112/76 mmHg  Pulse 92  Ht 5\' 7"  (1.702 m)  Wt 152 lb 12.8 oz (69.31 kg)  BMI 23.93 kg/m2  SpO2 96%  GEN: Well nourished, well developed, in no acute distress HEENT: normal Neck: no JVD, carotid bruits, or masses Cardiac: RRR; no murmurs, rubs, or gallops,no edema  Respiratory:  clear to auscultation bilaterally, normal work of breathing GI: soft, nontender, nondistended, + BS  No hepatomegaly  MS: no deformity Moving all extremities   Skin: warm and dry, no rash Psych: euthymic mood, flat affect   EKG:  EKG is not ordered today.   Lipid Panel    Component Value Date/Time   CHOL 160 08/17/2014 1522   TRIG 69.0 08/17/2014 1522   HDL 54.80 08/17/2014 1522   CHOLHDL 3 08/17/2014 1522   VLDL 13.8 08/17/2014 1522   LDLCALC 91 08/17/2014 1522      Wt Readings from Last 3 Encounters:  06/25/15 152 lb 12.8 oz (69.31 kg)  04/26/15 152 lb (68.947 kg)  02/21/15 163 lb (73.936 kg)      ASSESSMENT AND PLAN:  1  Dysautonimia  Pt Has drop in BP with standing that is less that 20 points.She is very symtpomatic   I would reocmm increasing florinef to 0.1 bid  Keep on pindolol  BMET in 1 wk  I have asked her to call with response to change   Keep up with fluids and salt intake   Try to avoid sudden changes in position like stoop t ostand  2.  HL  Keep on statin      Signed, Dorris Carnes, MD  06/25/2015 3:31 PM    [facility] [address], [place], [place]  [address] Phone: [phone]; Fax: [phone]

## 2015-06-25 NOTE — Patient Instructions (Signed)
Your physician has recommended you make the following change in your medication:  1.) increase Florinef to 0.1 mg two times per day  Please call or use the MyChart Patient Portal to message Dr. Harrington Challenger in a week to let her know how you are feeling after increasing florinef.  Take your blood pressures at home and keep a record to include with your message to Dr. Harrington Challenger.

## 2015-07-05 ENCOUNTER — Other Ambulatory Visit: Payer: Self-pay | Admitting: *Deleted

## 2015-07-05 DIAGNOSIS — G901 Familial dysautonomia [Riley-Day]: Secondary | ICD-10-CM

## 2015-07-06 ENCOUNTER — Other Ambulatory Visit (INDEPENDENT_AMBULATORY_CARE_PROVIDER_SITE_OTHER): Payer: BC Managed Care – PPO | Admitting: *Deleted

## 2015-07-06 DIAGNOSIS — G909 Disorder of the autonomic nervous system, unspecified: Secondary | ICD-10-CM

## 2015-07-06 DIAGNOSIS — G901 Familial dysautonomia [Riley-Day]: Secondary | ICD-10-CM

## 2015-07-06 LAB — BASIC METABOLIC PANEL WITH GFR
BUN: 10 mg/dL (ref 7–25)
CO2: 29 mmol/L (ref 20–31)
Calcium: 9.3 mg/dL (ref 8.6–10.4)
Chloride: 103 mmol/L (ref 98–110)
Creat: 0.87 mg/dL (ref 0.50–1.05)
Glucose, Bld: 144 mg/dL — ABNORMAL HIGH (ref 65–99)
Potassium: 4.1 mmol/L (ref 3.5–5.3)
Sodium: 143 mmol/L (ref 135–146)

## 2015-07-11 ENCOUNTER — Telehealth: Payer: Self-pay | Admitting: Neurology

## 2015-07-11 NOTE — Telephone Encounter (Signed)
Message For: OFFICE               Taken 31-MAY-17 at  2:22PM by JWT ------------------------------------------------------------  Caller  KATHY/CANDICE APPLE         CID  [id number]   Patient  Lynn Barnes        Pt's Dr  UNSURE        Area Code  336  Phone#  [phone] ON 5/17-IS IT IN     PROCESS?                                              Disp:Y/N  N  If Y = C/B If No Response In 88minutes

## 2015-07-11 NOTE — Telephone Encounter (Signed)
I am unsure if this request was received, MR must address this question. Sent to MR.

## 2015-07-12 ENCOUNTER — Other Ambulatory Visit: Payer: Self-pay | Admitting: Internal Medicine

## 2015-07-12 DIAGNOSIS — Z1231 Encounter for screening mammogram for malignant neoplasm of breast: Secondary | ICD-10-CM

## 2015-07-24 ENCOUNTER — Telehealth: Payer: Self-pay | Admitting: Neurology

## 2015-07-24 NOTE — Telephone Encounter (Signed)
I  have the release from [facility] [employer], I will faxed records over today.

## 2015-07-24 NOTE — Telephone Encounter (Signed)
[employer] is faxing paperwork thru on [phone]. Please call when you receive.

## 2015-07-25 NOTE — Telephone Encounter (Signed)
Records faxed to [facility] on 07/24/15.

## 2015-07-26 ENCOUNTER — Encounter: Payer: Self-pay | Admitting: Adult Health

## 2015-07-26 ENCOUNTER — Ambulatory Visit (INDEPENDENT_AMBULATORY_CARE_PROVIDER_SITE_OTHER): Payer: BC Managed Care – PPO | Admitting: Adult Health

## 2015-07-26 VITALS — BP 136/74 | HR 82 | Resp 16 | Ht 67.0 in | Wt 155.6 lb

## 2015-07-26 DIAGNOSIS — R000 Tachycardia, unspecified: Secondary | ICD-10-CM | POA: Diagnosis not present

## 2015-07-26 DIAGNOSIS — I951 Orthostatic hypotension: Secondary | ICD-10-CM | POA: Diagnosis not present

## 2015-07-26 DIAGNOSIS — T43505A Adverse effect of unspecified antipsychotics and neuroleptics, initial encounter: Secondary | ICD-10-CM

## 2015-07-26 DIAGNOSIS — G2111 Neuroleptic induced parkinsonism: Secondary | ICD-10-CM

## 2015-07-26 DIAGNOSIS — G90A Postural orthostatic tachycardia syndrome (POTS): Secondary | ICD-10-CM

## 2015-07-26 NOTE — Patient Instructions (Signed)
Overall things are stable Will continue to monitor If your symptoms worsen or you develop new symptoms please let us know.

## 2015-07-26 NOTE — Progress Notes (Signed)
PATIENT: Lynn Barnes DOB: 1960-02-14  REASON FOR VISIT: follow up- neuroleptic induced parkinsonism, pots and diabetic autonomic neuropathy HISTORY FROM: patient  HISTORY OF PRESENT ILLNESS: Lynn Barnes is a 55 year old female with a history of neuroleptic-induced parkinsonism, pots and diabetic autonomic neuropathy. She returns today for follow-up. She states that her tremors have gotten better. She continues to have a mild tremor in both hands. She denies any significant changes with her gait or balance. She does state that if she's been walking for greater than 200 feet she will develop shuffling gait. She states that she has had 2 syncopal episodes. She is followed by Dr. Harrington Challenger. She was recently switched to [place]. She does feel that her mood and depression is improving some. She denies any trouble swallowing. She states that she is sleeping well at night. No falls. She feels that her memory has remained stable. She is able to complete all ADLs independently. She returns today for an evaluation.  HISTORY (DOHMEIER):Lynn Barnes is a 55 year old female with a history of neuroleptic-induced parkinsonism, POTS and diabetic autonomic neuropathy. She returns today for follow-up. Overall the patient states she's been doing well. She continues to have a mild tremor in both hands. Denies any significant changes with her gait or balance. Denies any falls. She denies any troubles swallowing. He continues to take Risperdal. She was briefly started on Geodon. She states that since starting Geodon she feels that her memory has been affected. She states that she can't remember things people told her a day ago. She is able to complete all ADLs independently. She operates a Teacher, music without difficulty. The patient uses Geodon and Risperdal to treat bipolar disorder and depression. She returns today for an evaluation.  HISTORY 12/28/13: Lynn Barnes is a 55 year old female with a history of neuroleptic-induces  parkinsonism, POTS and diabetic autonomic neuropathy. She returns today for follow-up. She is taking Latuda and wellbutrin and tolerating it well. She feels that it has helped with the depression. She is no longer on viibyrd. She has a mild tremor in both hands. She continues to take Risperdal. Denies any trouble with her gait or balance. She fell last week due to orthostatic hypotension. She continues to take Ambien but it is no longer beneficial. She states that it takes a while for her to fall asleep but once asleep she does not wake up. It can take her 2-3 hours to fall asleep. The patient's psychiatrist is aware that the Ambien is no longer beneficial. The patient states that her psychiatrist did not want to change the medication this time.  HISTORY 12-12-15 : 55 year old female with a history of Neuroleptic-induced Parkinsonism, postural orthostatic tachycardia syndrome and diabetic autonomic neuropathy. 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 Formerly a patient of Dr Joni Fears.  The patient is currently being seen by psychiatry and is on Risperdal. In the past she has tried to wean off this medication but was not successful. She is currently on viibyrd and is tolerating it well. She reports that this has helped her depression and is better tolerated. Since her last visit she has remained stable. She continues to have a mild tremor in both hands. She reports having almost fallen when she tried to walk on the floating beach sand after a swim. . Denies trouble swallowing. She does not present dysphonic.  04-26-2015 Patient continues to have near syncopal episodes but it is better with the  pindolol. She is having trouble sleeping. States that it can take up to 3 hours for her to fall sleep. Then she will wake up frequently. She is unsure if the Ambien is helping. Patient does watch TV in bed. Feels like she is watching the clock all  night. Denies daytime sleepiness. No new medical issues since the last visit.   Lynn Barnes does have a masked face with less facial mimic, she does have a baseline and elevated muscle tone but no cogwheeling she shuffles when she walks, she sits very still is no sign of restless legs or fidgetiness. She provided good grip strength. She reports that she sometimes drools at night I think these are all long-term effects of her antipsychotic treatment but they have not left to a tardive dyskinesia., There is also possibility of Reglan causing some of the symptoms. 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 She does have a diabetic autonomic neuropathy. I discussed today that we may want to consider nuplazid, and I will ask Jill Poling to comment on this. She has been less depressed and fairly well controlled on Geodon and Risperdal. her memory testing revealed subjective feeling of memory loss rather than objective signs of it.  REVIEW OF SYSTEMS: Out of a complete 14 system review of symptoms, the patient complains only of the following symptoms, and all other reviewed systems are negative.  See history of present illness  ALLERGIES: Allergies  Allergen Reactions  . Nsaids Other (See Comments)    Irritates stomach  . Sulfonamide Derivatives Rash    HOME MEDICATIONS: Outpatient Prescriptions Prior to Visit  Medication Sig Dispense Refill  . atorvastatin (LIPITOR) 40 MG tablet TAKE 1 TABLET BY MOUTH EVERY DAY 30 tablet 0  . clonazePAM (KLONOPIN) 1 MG tablet Take 1 mg by mouth 2 (two) times daily as needed for anxiety.    . fludrocortisone (FLORINEF) 0.1 MG tablet Take one tablet by mouth twice daily at 6AM and 2PM 30 tablet 11  . glucose blood test strip 1 each by Other route. 7 times per day   - as instructed    . LATUDA 40 MG TABS tablet TAKE 1 TABLET BY MOUTH WITH MEAL  0  . NOVOLOG 100 UNIT/ML injection Insulin pump    . pindolol (VISKEN) 5 MG tablet  TAKE 1 TABLET (5 MG TOTAL) BY MOUTH 2 (TWO) TIMES DAILY. 180 tablet 3  . risperidone (RISPERDAL) 4 MG tablet Take 8 mg by mouth at bedtime.  0  . zolpidem (AMBIEN) 10 MG tablet Take 10 mg by mouth at bedtime.  1   No facility-administered medications prior to visit.    PAST MEDICAL HISTORY: Past Medical History  Diagnosis Date  . Rotator cuff syndrome of left shoulder   . S/P partial hysterectomy   . Addiction, opium ([place])   . Therapy     on therapy by psychiatrist using sebutex   . Parkinsonism ([place])     due to prolonged prozac/risperdl for yrs/Dr Dohmeir  . IBS (irritable bowel syndrome) 02/15/2011  . Bipolar disorder ([place]) 02/15/2011  . Anxiety     ADHD CHRONIC ANXIETY  . Depression   . Diabetes mellitus     sees Dr Altheimer/endo  . Thyroid nodule     s/p biopsy - benign approx jan 2012  . Insomnia 02/21/2011  . Subacute dyskinesia due to drugs(333.85)     dr dohmeier/neuro  . Resting tremor   . Cervical spondylosis without myelopathy   .  Orthostatic hypotension   . Allergy     spring  . Hyperlipidemia   . Dysautonomia 10/24/2011  . Tremor   . Parkinsonism ([place])   . 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 ([place])     opium  . Orthostatic hypotension   . Dyskinesia      subacute,due to drugs  . Spondylosis without myelopathy   . POTS (postural orthostatic tachycardia syndrome)   . Tachycardia     syndrome  . Dyslipidemia   . Neuropathy ([facility])     with paresthesias in feet  . Radiculopathy     cervical  . Multinodular thyroid     with large dominant solid nodule with calcifications in the lower left pole benign by FNA in 1/12 and stable by repeat ulltrasound in 4/13  . Depression   . 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 ([place])   . History of nuclear stress test     Myoview 11/16: EF 81%, no ischemia; Low Risk    PAST SURGICAL HISTORY: Past Surgical History  Procedure Laterality Date  . Oophorectomy    . Herniated disc cervical spine    . Breast lumpectomy Right   . Spine surgery   feb 2012    c5-6 fusion, Dr Rolena Infante  . Lower back  1992  . Thyroid lobectomy  11/11    nodule biopsy, benign  . Partial hysterectomy    . Acromioplasty Left 2002    Dr Gladstone Lighter    FAMILY HISTORY: Family History  Problem Relation Age of Onset  . Lung cancer Mother   . Heart disease Father   . Hypertension Father   . Heart disease Paternal Grandmother   . ALS Paternal Grandmother   . Hypertension Sister     SOCIAL HISTORY: Social History   Social History  . Marital Status: Single    Spouse Name: N/A  . Number of Children: 0  . Years of Education: BA   Occupational History  . [employer]   Social History Main Topics  . Smoking status: Never Smoker   . Smokeless tobacco: Never Used  . Alcohol Use: No  . Drug Use: No  . Sexual Activity: No   Other Topics Concern  . Not on file   Social History Narrative   Patient is single and lives with my partner ([place]).   Patient is disabled.   Patient is right-handed.   Patient has a Haematologist.   Patient drinks one soda every other day.      PHYSICAL EXAM  Filed Vitals:   07/26/15 1411  BP: 136/74  Pulse: 82  Resp: 16  Height: 5' 7"  (1.702 m)  Weight: 155 lb 9.6 oz (70.58 kg)   Body mass index is 24.36 kg/(m^2).  Generalized: Well developed, in no acute distress   Neurological examination  Mentation: Alert oriented to time, place, history taking. Follows all commands speech and language fluent, mild masking of the face.mmse 30/30 Cranial nerve II-XII: Pupils were equal round reactive to light. Extraocular movements were full, visual field were full on confrontational test. Facial sensation and strength were normal. Uvula tongue midline. Head turning and shoulder shrug  were normal and symmetric. Motor: The motor testing reveals 5 over 5 strength of all 4 extremities. Good symmetric motor tone is noted throughout.  Sensory: Sensory testing is intact to soft touch on all 4  extremities. No evidence of extinction is noted.  Coordination: Cerebellar testing reveals good finger-nose-finger and heel-to-shin bilaterally.  Gait and station: Patient has  good stride. Decreased arm swing. Tandem gait is unsteady. Romberg is negative. Reflexes: Deep tendon reflexes are symmetric and normal bilaterally.   DIAGNOSTIC DATA (LABS, IMAGING, TESTING) - I reviewed patient records, labs, notes, testing and imaging myself where available.  Lab Results  Component Value Date   WBC 4.4 08/17/2014   HGB 14.2 08/17/2014   HCT 41.6 08/17/2014   MCV 94.0 08/17/2014   PLT 260.0 08/17/2014      Component Value Date/Time   NA 143 07/06/2015 1523   K 4.1 07/06/2015 1523   CL 103 07/06/2015 1523   CO2 29 07/06/2015 1523   GLUCOSE 144* 07/06/2015 1523   BUN 10 07/06/2015 1523   CREATININE 0.87 07/06/2015 1523   CREATININE 0.92 08/17/2014 1522   CALCIUM 9.3 07/06/2015 1523   PROT 6.5 08/17/2014 1522   ALBUMIN 3.8 08/17/2014 1522   AST 16 08/17/2014 1522   ALT 16 08/17/2014 1522   ALKPHOS 64 08/17/2014 1522   BILITOT 0.4 08/17/2014 1522   GFRNONAA >60 03/22/2010 1251   GFRAA  03/22/2010 1251    >60        The eGFR has been calculated using the MDRD equation. This calculation has not been validated in all clinical situations. eGFR's persistently <60 mL/min signify possible Chronic Kidney Disease.   Lab Results  Component Value Date   CHOL 160 08/17/2014   HDL 54.80 08/17/2014   LDLCALC 91 08/17/2014   TRIG 69.0 08/17/2014   CHOLHDL 3 08/17/2014   Lab Results  Component Value Date   HGBA1C 9.0* 08/17/2014   Lab Results  Component Value Date   BOFPULGS93 241* 08/17/2014   Lab Results  Component Value Date   TSH 2.09 08/17/2014      ASSESSMENT AND PLAN 55 y.o. year old female  has a past medical history of Rotator cuff syndrome of left shoulder; S/P partial hysterectomy; Addiction, opium ([place]); Therapy; Parkinsonism ([place]); IBS (irritable bowel syndrome)  (02/15/2011); Bipolar disorder ([place]) (02/15/2011); Anxiety; Depression; Diabetes mellitus; Thyroid nodule; Insomnia (02/21/2011); Subacute dyskinesia due to drugs(333.85); Resting tremor; Cervical spondylosis without myelopathy; Orthostatic hypotension; Allergy; Hyperlipidemia; Dysautonomia (10/24/2011); Tremor; Parkinsonism ([place]); 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 [facility]); Orthostatic hypotension; Dyskinesia; Spondylosis without myelopathy; POTS (postural orthostatic tachycardia syndrome); Tachycardia; Dyslipidemia; Neuropathy ([place]); Radiculopathy; Multinodular thyroid; Depression; 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 ([place]); and History of nuclear stress test. here with:  1. Neuroleptic-induced parkinsonism 2. Pots 3. Diabetic autonomic neuropathy  Overall the patient has remained stable. She continues to have a mild tremor and masking of the face. Her memory has remained stable. MMSE 30/30. Patient advised that if her symptoms worsen or she develops any new symptoms she should let us know. She will follow-up in 6 months or sooner if needed.    Ward Givens, MSN, NP-C 07/26/2015, 10:09 PM [facility] [address] [place], [place] [address] [phone]

## 2015-07-27 NOTE — Progress Notes (Signed)
I agree with the assessment and plan as directed by NP .The patient is known to me .   Etoile Looman, MD  

## 2015-07-30 ENCOUNTER — Ambulatory Visit: Payer: BC Managed Care – PPO

## 2015-08-20 ENCOUNTER — Ambulatory Visit
Admission: RE | Admit: 2015-08-20 | Discharge: 2015-08-20 | Disposition: A | Discharge status: Home / Self Care | Payer: BC Managed Care – PPO | Source: Ambulatory Visit | Attending: Internal Medicine | Admitting: Internal Medicine

## 2015-08-20 DIAGNOSIS — Z1231 Encounter for screening mammogram for malignant neoplasm of breast: Secondary | ICD-10-CM

## 2015-08-21 ENCOUNTER — Ambulatory Visit (INDEPENDENT_AMBULATORY_CARE_PROVIDER_SITE_OTHER): Payer: BC Managed Care – PPO | Admitting: Internal Medicine

## 2015-08-21 VITALS — BP 124/78 | HR 87 | Temp 98.1°F | Resp 20 | Wt 154.0 lb

## 2015-08-21 DIAGNOSIS — Z1159 Encounter for screening for other viral diseases: Secondary | ICD-10-CM | POA: Diagnosis not present

## 2015-08-21 DIAGNOSIS — Z0000 Encounter for general adult medical examination without abnormal findings: Secondary | ICD-10-CM

## 2015-08-21 DIAGNOSIS — E785 Hyperlipidemia, unspecified: Secondary | ICD-10-CM | POA: Diagnosis not present

## 2015-08-21 DIAGNOSIS — F411 Generalized anxiety disorder: Secondary | ICD-10-CM

## 2015-08-21 DIAGNOSIS — E1042 Type 1 diabetes mellitus with diabetic polyneuropathy: Secondary | ICD-10-CM | POA: Diagnosis not present

## 2015-08-21 NOTE — Progress Notes (Signed)
Subjective:    Patient ID: Lynn Barnes, female    DOB: [birth year], 55 y.o.   MRN: [id number]  HPI  Here to f/u; overall doing ok,  Pt denies chest pain, increasing sob or doe, wheezing, orthopnea, PND, increased LE swelling, palpitations, dizziness or syncope.  Pt denies new neurological symptoms such as new headache, or facial or extremity weakness or numbness.  Pt denies polydipsia, polyuria, or low sugar episode.   Pt denies new neurological symptoms such as new headache, or facial or extremity weakness or numbness.   Pt states overall good compliance with meds, mostly trying to follow appropriate diet, with wt overall stable,  but little exercise however. Falling freq for about 10 yrs. Due to dysautonomia. Cont's to see neurology, endo, psychiatry on regular basis.  Denies worsening depressive symptoms, suicidal ideation, or panic. Past Medical History  Diagnosis Date  . Rotator cuff syndrome of left shoulder   . S/P partial hysterectomy   . 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 Therapy     on therapy by psychiatrist using sebutex   . Parkinsonism ([place])     due to prolonged prozac/risperdl for yrs/Dr Dohmeir  . IBS (irritable bowel syndrome) 02/15/2011  . Bipolar disorder ([place]) 02/15/2011  . Anxiety     ADHD CHRONIC ANXIETY  . Depression   . Diabetes mellitus     sees Dr Altheimer/endo  . Thyroid nodule     s/p biopsy - benign approx jan 2012  . Insomnia 02/21/2011  . Subacute dyskinesia due to drugs(333.85)     dr dohmeier/neuro  . Resting tremor   . Cervical spondylosis without myelopathy   . Orthostatic hypotension   . Allergy     spring  . Hyperlipidemia   . Dysautonomia 10/24/2011  . Tremor   . Parkinsonism ([place])   . 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 ([place])     opium  . Orthostatic hypotension   . Dyskinesia      subacute,due to drugs  . Spondylosis without myelopathy   . POTS (postural orthostatic tachycardia syndrome)   . Tachycardia     syndrome  . Dyslipidemia   . Neuropathy (HCC)    with paresthesias in feet  . Radiculopathy     cervical  . Multinodular thyroid     with large dominant solid nodule with calcifications in the lower left pole benign by FNA in 1/12 and stable by repeat ulltrasound in 4/13  . Depression   . 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 ([place])   . History of nuclear stress test     Myoview 11/16: EF 81%, no ischemia; Low Risk   Past Surgical History  Procedure Laterality Date  . Oophorectomy    . Herniated disc cervical spine    . Breast lumpectomy Right   . Spine surgery  feb 2012    c5-6 fusion, Dr Rolena Infante  . Lower back  1992  . Thyroid lobectomy  11/11    nodule biopsy, benign  . Partial hysterectomy    . Acromioplasty Left 2002    Dr Gladstone Lighter    reports that she has never smoked. She has never used smokeless tobacco. She reports that she does not drink alcohol or use illicit drugs. family history includes ALS in her paternal grandmother; Heart disease in her father and paternal grandmother; Hypertension in her father and sister; Lung cancer in her mother. Allergies  Allergen Reactions  . Nsaids Other (See Comments)    Irritates stomach  . Sulfonamide Derivatives Rash  Current Outpatient Prescriptions on File Prior to Visit  Medication Sig Dispense Refill  . atorvastatin (LIPITOR) 40 MG tablet TAKE 1 TABLET BY MOUTH EVERY DAY 30 tablet 0  . clonazePAM (KLONOPIN) 1 MG tablet Take 1 mg by mouth 2 (two) times daily as needed for anxiety.    . fludrocortisone (FLORINEF) 0.1 MG tablet Take one tablet by mouth twice daily at 6AM and 2PM 30 tablet 11  . glucose blood test strip 1 each by Other route. 7 times per day   - as instructed    . LATUDA 40 MG TABS tablet TAKE 1 TABLET BY MOUTH WITH MEAL  0  . NOVOLOG 100 UNIT/ML injection Insulin pump    . pindolol (VISKEN) 5 MG tablet TAKE 1 TABLET (5 MG TOTAL) BY MOUTH 2 (TWO) TIMES DAILY. 180 tablet 3  . risperidone (RISPERDAL) 4 MG tablet Take 8 mg by mouth at bedtime.  0  . zolpidem (AMBIEN)  10 MG tablet Take 10 mg by mouth at bedtime.  1   No current facility-administered medications on file prior to visit.   Review of Systems  Constitutional: Negative for unusual diaphoresis or night sweats HENT: Negative for ear swelling or discharge Eyes: Negative for worsening visual haziness  Respiratory: Negative for choking and stridor.   Gastrointestinal: Negative for distension or worsening eructation Genitourinary: Negative for retention or change in urine volume.  Musculoskeletal: Negative for other MSK pain or swelling Skin: Negative for color change and worsening wound Neurological: Negative for tremors and numbness other than noted  Psychiatric/Behavioral: Negative for decreased concentration or agitation other than above       Objective:   Physical Exam BP 124/78 mmHg  Pulse 87  Temp(Src) 98.1 F (36.7 C) (Oral)  Resp 20  Wt 154 lb (69.854 kg)  SpO2 94% VS noted, not ill appearing Constitutional: Pt appears in no apparent distress HENT: Head: NCAT.  Right Ear: External ear normal.  Left Ear: External ear normal.  Eyes: . Pupils are equal, round, and reactive to light. Conjunctivae and EOM are normal Neck: Normal range of motion. Neck supple.  Cardiovascular: Normal rate and regular rhythm.   Pulmonary/Chest: Effort normal and breath sounds without rales or wheezing.  Abd:  Soft, NT, ND, + BS Neurological: Pt is alert. Not confused , motor grossly intact Skin: Skin is warm. No rash, no LE edema Psychiatric: Pt behavior is normal. No agitation. mod nervous  Lab Results  Component Value Date   WBC 4.4 08/17/2014   HGB 14.2 08/17/2014   HCT 41.6 08/17/2014   PLT 260.0 08/17/2014   GLUCOSE 144* 07/06/2015   CHOL 160 08/17/2014   TRIG 69.0 08/17/2014   HDL 54.80 08/17/2014   LDLCALC 91 08/17/2014   ALT 16 08/17/2014   AST 16 08/17/2014   NA 143 07/06/2015   K 4.1 07/06/2015   CL 103 07/06/2015   CREATININE 0.87 07/06/2015   BUN 10 07/06/2015   CO2 29  07/06/2015   TSH 2.09 08/17/2014   HGBA1C 9.0* 08/17/2014       Assessment & Plan:

## 2015-08-21 NOTE — Patient Instructions (Signed)
Please continue all other medications as before, and refills have been done if requested.  Please have the pharmacy call with any other refills you may need.  Please continue your efforts at being more active, low cholesterol diet, and weight control.  You are otherwise up to date with prevention measures today.  Please keep your appointments with your specialists as you may have planned  Please return in 1 year for your yearly visit, or sooner if needed, with Lab testing done 3-5 days before  

## 2015-08-21 NOTE — Progress Notes (Signed)
Pre visit review using our clinic review tool, if applicable. No additional management support is needed unless otherwise documented below in the visit note. 

## 2015-08-28 NOTE — Assessment & Plan Note (Signed)
stable overall by history and exam, recent data reviewed with pt, and pt to continue medical treatment as before,  to f/u any worsening symptoms or concerns, has a1c with endo Lab Results  Component Value Date   HGBA1C 9.0* 08/17/2014

## 2015-08-28 NOTE — Assessment & Plan Note (Signed)
stable overall by history and exam, recent data reviewed with pt, and pt to continue medical treatment as before,  to f/u any worsening symptoms or concerns Lab Results  Component Value Date   WBC 4.4 08/17/2014   HGB 14.2 08/17/2014   HCT 41.6 08/17/2014   PLT 260.0 08/17/2014   GLUCOSE 144* 07/06/2015   CHOL 160 08/17/2014   TRIG 69.0 08/17/2014   HDL 54.80 08/17/2014   LDLCALC 91 08/17/2014   ALT 16 08/17/2014   AST 16 08/17/2014   NA 143 07/06/2015   K 4.1 07/06/2015   CL 103 07/06/2015   CREATININE 0.87 07/06/2015   BUN 10 07/06/2015   CO2 29 07/06/2015   TSH 2.09 08/17/2014   HGBA1C 9.0* 08/17/2014

## 2015-08-28 NOTE — Assessment & Plan Note (Signed)
Lab Results  Component Value Date   LDLCALC 91 08/17/2014   stable overall by history and exam, recent data reviewed with pt, and pt to continue medical treatment as before,  to f/u any worsening symptoms or concerns

## 2016-01-03 ENCOUNTER — Other Ambulatory Visit: Payer: Self-pay | Admitting: Internal Medicine

## 2016-01-25 ENCOUNTER — Other Ambulatory Visit: Payer: Self-pay | Admitting: Internal Medicine

## 2016-01-28 ENCOUNTER — Ambulatory Visit: Payer: BC Managed Care – PPO | Admitting: Neurology

## 2016-02-10 ENCOUNTER — Other Ambulatory Visit: Payer: Self-pay | Admitting: Internal Medicine

## 2016-02-21 ENCOUNTER — Other Ambulatory Visit: Payer: Self-pay | Admitting: *Deleted

## 2016-02-21 MED ORDER — FLUDROCORTISONE ACETATE 0.1 MG PO TABS: ORAL_TABLET | ORAL | 0 refills | Status: DC

## 2016-02-21 MED ORDER — PINDOLOL 5 MG PO TABS: 5.0000 mg | ORAL_TABLET | Freq: Two times a day (BID) | ORAL | 0 refills | Status: DC

## 2016-02-29 ENCOUNTER — Encounter: Payer: Self-pay | Admitting: Internal Medicine

## 2016-02-29 ENCOUNTER — Other Ambulatory Visit: Payer: Self-pay | Admitting: *Deleted

## 2016-02-29 MED ORDER — ATORVASTATIN CALCIUM 40 MG PO TABS: 40.0000 mg | ORAL_TABLET | Freq: Every day | ORAL | 1 refills | Status: DC

## 2016-03-03 DIAGNOSIS — Z9641 Presence of insulin pump (external) (internal): Secondary | ICD-10-CM | POA: Insufficient documentation

## 2016-03-03 DIAGNOSIS — E782 Mixed hyperlipidemia: Secondary | ICD-10-CM | POA: Insufficient documentation

## 2016-03-04 ENCOUNTER — Other Ambulatory Visit: Payer: Self-pay | Admitting: Internal Medicine

## 2016-03-04 ENCOUNTER — Telehealth (INDEPENDENT_AMBULATORY_CARE_PROVIDER_SITE_OTHER): Payer: Medicare Other | Admitting: Internal Medicine

## 2016-03-04 DIAGNOSIS — I951 Orthostatic hypotension: Secondary | ICD-10-CM | POA: Diagnosis not present

## 2016-03-04 DIAGNOSIS — G90A Postural orthostatic tachycardia syndrome (POTS): Secondary | ICD-10-CM

## 2016-03-04 DIAGNOSIS — R000 Tachycardia, unspecified: Secondary | ICD-10-CM | POA: Diagnosis not present

## 2016-03-04 MED ORDER — FLUDROCORTISONE ACETATE 0.1 MG PO TABS: 0.0500 mg | ORAL_TABLET | Freq: Two times a day (BID) | ORAL | 0 refills | Status: DC

## 2016-03-04 NOTE — Telephone Encounter (Signed)
Spoke with patient who is aware to decrease Florinef to 0.05 mg BID.  She will c/b if continued elevation in BP.

## 2016-03-04 NOTE — Telephone Encounter (Signed)
Patient states that she went to the doctor yesterday and her BP was 164/100 and that she went to [facility] last night and it was about the same. The patient denies any chest pain, headache, shortness of breath, lightheadedness, or any other symptoms. The patient states that she was up moving around when her BP was checked and that she has not checked it at rest. The patient states that she has been taking her florinef 0.1 mg twice a day and pindolol 5 mg twice a day. The patient states that she is going to buy a BP monitor for home and check her BP at rest and let us know if it is not improved. The patient was advised to let us know if her BP was not improved and if she develops any symptoms. Patient verbalized understanding.

## 2016-03-04 NOTE — Telephone Encounter (Signed)
Patient is returning your call,thanks. °

## 2016-03-04 NOTE — Telephone Encounter (Signed)
Cut back on florinef to 0.05 bid

## 2016-03-04 NOTE — Telephone Encounter (Signed)
Lynn Barnes is calling because her blood pressure is up really high 164/100 as of last night . Please call   Thanks

## 2016-03-06 ENCOUNTER — Other Ambulatory Visit: Payer: Self-pay | Admitting: *Deleted

## 2016-03-06 MED ORDER — FLUDROCORTISONE ACETATE 0.1 MG PO TABS: 0.0500 mg | ORAL_TABLET | Freq: Two times a day (BID) | ORAL | 0 refills | Status: DC

## 2016-03-06 NOTE — Telephone Encounter (Signed)
Medication Detail    Disp Refills Start End   pindolol (VISKEN) 5 MG tablet 180 tablet 0 02/21/2016    Sig - Route: Take 1 tablet (5 mg total) by mouth 2 (two) times daily. - Oral   E-Prescribing Status: Receipt confirmed by pharmacy (02/21/2016 2:14 PM EST)   Pharmacy   [place], [place]

## 2016-03-06 NOTE — Telephone Encounter (Signed)
Telephone   03/04/2016 [facility]  Fay Records, MD  Cardiology   Advice Only  Reason for call   Conversation: Advice Only  (Newest Message First)  Shellia Cleverly, RN      03/04/16 3:34 PM  Note    Spoke with patient who is aware to decrease Florinef to 0.05 mg BID.  She will c/b if continued elevation in BP.

## 2016-04-02 ENCOUNTER — Encounter: Payer: Self-pay | Admitting: Neurology

## 2016-04-02 ENCOUNTER — Ambulatory Visit (INDEPENDENT_AMBULATORY_CARE_PROVIDER_SITE_OTHER): Payer: Medicare Other | Admitting: Neurology

## 2016-04-02 VITALS — BP 118/70 | HR 82 | Resp 16 | Ht 67.0 in | Wt 143.0 lb

## 2016-04-02 DIAGNOSIS — G903 Multi-system degeneration of the autonomic nervous system: Secondary | ICD-10-CM

## 2016-04-02 DIAGNOSIS — I951 Orthostatic hypotension: Secondary | ICD-10-CM

## 2016-04-02 DIAGNOSIS — G20 Parkinson's disease: Secondary | ICD-10-CM

## 2016-04-02 MED ORDER — TRIHEXYPHENIDYL HCL 2 MG PO TABS: 2.0000 mg | ORAL_TABLET | Freq: Three times a day (TID) | ORAL | 3 refills | Status: DC

## 2016-04-02 NOTE — Patient Instructions (Signed)
Trail of Artane. triheyphenidyl 2 mg , take 1/2 tab in AM for 1 week, than a full tab for the following week, than 4 mg tha week after, 6 mg the week after .    Trihexyphenidyl tablets  What is this medicine? TRIHEXYPHENIDYL (trye hex ee FEN i dil) is for Parkinsonism or for movement problems caused by certain drugs. This medicine may be used for other purposes; ask your health care provider or pharmacist if you have questions. COMMON BRAND NAME(S): Artane What should I tell my health care provider before I take this medicine? They need to know if you have any of these conditions: -glaucoma -heart disease -high blood pressure -kidney disease -liver disease -prostate problems -an unusual or allergic reaction to trihexyphenidyl, other medicines, lactose, foods, dyes, or preservatives -pregnant or trying to get pregnant -breast-feeding How should I use this medicine? Take this medicine by mouth with a full glass of water. Follow the directions on the prescription label. Take your medicine at regular intervals. Do not take your medicine more often than directed. Do not suddenly stop taking your medicine because you may develop a severe reaction. Talk to your pediatrician regarding the use of this medicine in children. Special care may be needed. Patients over 31 years old may have a stronger reaction and need a smaller dose. Overdosage: If you think you have taken too much of this medicine contact a poison control center or emergency room at once. NOTE: This medicine is only for you. Do not share this medicine with others. What if I miss a dose? If you miss a dose, take it as soon as you can. If it is almost time for your next dose, take only that dose. Do not take double or extra doses. What may interact with this medicine? -benztropine -drugs for bladder problems -drugs for breathing problems like ipratropium and tiotropium -drugs for certain stomach or intestine problems like  propantheline, homatropine methylbromide, glycopyrrolate, atropine, belladonna, and dicyclomine -levodopa -scopolamine This list may not describe all possible interactions. Give your health care provider a list of all the medicines, herbs, non-prescription drugs, or dietary supplements you use. Also tell them if you smoke, drink alcohol, or use illegal drugs. Some items may interact with your medicine. What should I watch for while using this medicine? Your doctor or health care professional may want you to have eye exams while you are taking this medicine. Your mouth may get dry. Chewing sugarless gum or sucking hard candy, and drinking plenty of water may help. Contact your doctor if the problem does not go away or is severe. This medicine may cause dry eyes and blurred vision. If you wear contact lenses you may feel some discomfort. Lubricating drops may help. See your eye doctor if the problem does not go away or is severe. You may get drowsy or dizzy. Do not drive, use machinery, or do anything that needs mental alertness until you know how this medicine affects you. Do not stand or sit up quickly, especially if you are an older patient. This reduces the risk of dizzy or fainting spells. Alcohol may interfere with the effect of this medicine. Avoid alcoholic drinks. What side effects may I notice from receiving this medicine? Side effects that you should report to your doctor or health care professional as soon as possible: -allergic reactions like skin rash, itching or hives, swelling of the face, lips, or tongue -changes in vision -fast or irregular heartbeat -hallucinations -vomiting Side effects that usually do  not require medical attention (report to your doctor or health care professional if they continue or are bothersome): -anxiety or nervousness -dizziness -drowsiness -nausea This list may not describe all possible side effects. Call your doctor for medical advice about side  effects. You may report side effects to FDA at 1-800-FDA-1088. Where should I keep my medicine? Keep out of the reach of children. Store at room temperature between 15 and 30 degrees C (59 and 86 degrees F). Throw away any unused medicine after the expiration date. NOTE: This sheet is a summary. It may not cover all possible information. If you have questions about this medicine, talk to your doctor, pharmacist, or health care provider.  2017 Elsevier/Gold Standard (2007-10-14 11:19:06)

## 2016-04-02 NOTE — Progress Notes (Signed)
PATIENT: Lynn Barnes DOB: [birth year]  REASON FOR VISIT: follow up- neuroleptic-induced parkinsonism, POTS, diabetic autonomic neuropathy HISTORY FROM: patient and partner   HISTORY OF PRESENT ILLNESS:  Lynn Barnes is a 56 year old female with a history of neuroleptic-induced parkinsonism, POTS and diabetic autonomic neuropathy. She returns today for follow-up. Overall the patient states she's been doing well. She continues to have a mild tremor in both hands. Denies any significant changes with her gait or balance. Denies any falls. She denies any troubles swallowing. Lynn Barnes continues to take Risperdal. She was briefly started on Geodon. She states that since starting Geodon she feels that her memory has been affected. She states that she can't remember things people told her a day ago. She is able to complete all ADLs independently. She operates a Teacher, music without difficulty. The patient uses Geodon and Risperdal to treat bipolar disorder and depression. She returns today for an evaluation.  HISTORY 12/28/13: Lynn Barnes is a 56 year old female with a history of neuroleptic-induces parkinsonism, POTS and diabetic autonomic neuropathy. She returns today for follow-up. She is  taking Latuda and wellbutrin and tolerating it well. She feels that it has helped with the depression. She is no longer on viibyrd. She has a mild tremor in both hands. She continues to take Risperdal. Denies any trouble with her gait or balance. She fell last week due to orthostatic hypotension. She continues to take Ambien but it is no longer beneficial. She states that it takes a while for her to fall asleep but once asleep she does not wake up. It can take her 2-3 hours to fall asleep. The patient's psychiatrist is aware that the Ambien is no longer beneficial. The patient states that her psychiatrist did not want to change the medication this time.  HISTORY 12-12-15 : 56 year old female with a history of  Neuroleptic-induced Parkinsonism, postural orthostatic tachycardia syndrome and diabetic autonomic neuropathy. 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 Formerly a patient of Dr Joni Fears.  The patient is currently being seen by psychiatry and is on Risperdal.  In the past she has tried to wean off this medication but was not successful. She is currently on viibyrd and is tolerating it well. She reports that this has helped her depression and is better tolerated. Since her last visit she has remained stable. She continues to have a mild tremor in both hands. She reports having almost fallen when she tried to walk on the floating beach sand after a swim. . Denies trouble swallowing. She does not present dysphonic.   04-26-2015 Patient continues to have near syncopal episodes but it is better with the pindolol.She is having trouble sleeping. States that it can take up to 3 hours for her to fall sleep. Then she will wake up frequently. She is unsure if the Ambien is helping. Patient does watch TV in bed. Feels like she is watching the clock all night. Denies daytime sleepiness. No new medical issues since the last visit.  Lynn Barnes does have a masked face with less facial mimic, she does have a baseline and elevated muscle tone but no cogwheeling she shuffles when she walks, she sits very still is no sign of restless legs or fidgetiness. She provided good grip strength. She reports that she sometimes drools at night I think these are all long-term effects of her antipsychotic treatment but they have not left to a tardive dyskinesia., There  is also possibility of Reglan causing some of the symptoms. 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 She does have a diabetic autonomic neuropathy. I discussed today that we may want to consider nuplazid, and I will ask Jill Poling to comment on this. She has been less depressed  and fairly well controlled on Geodon and Risperdal. her memory testing revealed subjective feeling of memory loss rather than objective signs of it. She had developed oneadditional symptoms of parkinsonism- she has a much harder time not falling when she walks on uneven surface. She explained an example when she was at the beach trying to leave the ocean water and the sound was moving under her feet that it was very difficult for her to stand up and actually walk.  This is consistent with a gait disorder given her gait instability and neuropathy. 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 Her MMSE today is 29/30. The [place] was 26/30 . This is in normal limits.   Interval histort 04-02-2016,   there are times when my mouth, jaw and tongue trembling. Reglan induced? Lynn Barnes has not used Reglan in the past., Her depression however has been treated with Geodon and Risperdal. Neuroleptic induced.  She has been evaluated for parkinsonism secondary induced due to medication here before, and one of her concerns is memory loss, the feeling of not tracking thought process all the way through. I evaluated her memory last 8 year ago in Barnes 2017 when she scored 26 out of 30 points on a Montral cognitive assessment. Today she scored 25 out of 30 and I will print those results for comparison below. I do not think that Lynn Barnes has early onset dementia but maybe a cognitive disorder. She should take prenatal vitamins and I will try Artane for tremor.    REVIEW OF SYSTEMS: Out of a complete 14 system review of symptoms, the patient complains only of the following symptoms, and all other reviewed systems are negative.  Eye discharge, eye itching, runny nose, restless leg, insomnia, frequent waking, snoring, walking difficulty, neck pain, environmental allergies, and memory loss, tremors, passing out, agitation, confusion, decreased concentration,  depression, nervous ALLERGIES: Allergies  Allergen Reactions  . Nsaids Other (See Comments)    Irritates stomach  . Sulfonamide Derivatives Rash    HOME MEDICATIONS: Outpatient Medications Prior to Visit  Medication Sig Dispense Refill  . atorvastatin (LIPITOR) 40 MG tablet TAKE 1 TABLET BY MOUTH EVERY DAY 30 tablet 0  . atorvastatin (LIPITOR) 40 MG tablet Take 1 tablet (40 mg total) by mouth daily. Yearly physical is due in July must see MD for future refills 90 tablet 1  . clonazePAM (KLONOPIN) 1 MG tablet Take 1 mg by mouth 2 (two) times daily as needed for anxiety.    . fludrocortisone (FLORINEF) 0.1 MG tablet Take 0.5 tablets (0.05 mg total) by mouth 2 (two) times daily. 90 tablet 0  . glucose blood test strip 1 each by Other route. 7 times per day   - as instructed    . LATUDA 40 MG TABS tablet TAKE 1 TABLET BY MOUTH WITH MEAL  0  . NOVOLOG 100 UNIT/ML injection Insulin pump    . pindolol (VISKEN) 5 MG tablet Take 1 tablet (5 mg total) by mouth 2 (two) times daily. 180 tablet 0  . risperidone (RISPERDAL) 4 MG tablet Take 8 mg by mouth at bedtime.  0  . zolpidem (  AMBIEN) 10 MG tablet Take 10 mg by mouth at bedtime.  1   No facility-administered medications prior to visit.     PAST MEDICAL HISTORY:   PAST SURGICAL HISTORY: Past Surgical History:  Procedure Laterality Date  . ACROMIOPLASTY Left 2002   Dr Gladstone Lighter  . BREAST LUMPECTOMY Right   . herniated disc cervical spine    . lower back  1992  . OOPHORECTOMY    . PARTIAL HYSTERECTOMY    . SPINE SURGERY  feb 2012   c5-6 fusion, Dr Rolena Infante  . THYROID LOBECTOMY  11/11   nodule biopsy, benign    FAMILY HISTORY: Family History  Problem Relation Age of Onset  . Lung cancer Mother   . Heart disease Father   . Hypertension Father   . Heart disease Paternal Grandmother   . ALS Paternal Grandmother   . Hypertension Sister     SOCIAL HISTORY: Social History   Social History  . Marital status: Single    Spouse  name: N/A  . Number of children: 0  . Years of education: BA   Occupational History  . [employer]   Social History Main Topics  . Smoking status: Never Smoker  . Smokeless tobacco: Never Used  . Alcohol use No  . Drug use: No  . Sexual activity: No   Other Topics Concern  . Not on file   Social History Narrative   Patient is single and lives with my partner ([place]).   Patient is disabled.   Patient is right-handed.   Patient has a Haematologist.   Patient drinks one soda every other day.      PHYSICAL EXAM  Vitals:   04/02/16 1543  BP: 118/70  Pulse: 82  Resp: 16  Weight: 143 lb (64.9 kg)  Height: 5' 7"  (1.702 m)   Body mass index is 22.4 kg/m.  Generalized: Well developed, in no acute distress   Neurological examination  Mentation: Alert oriented to time, place, history taking. Follows all commands speech and language fluent. MMSE 29/30,  Montreal Cognitive Assessment  04/02/2016 02/21/2015  Visuospatial/ Executive (0/5) 5 5  Naming (0/3) 3 3  Attention: Read list of digits (0/2) 2 2  Attention: Read list of letters (0/1) 1 1  Attention: Serial 7 subtraction starting at 100 (0/3) 3 3  Language: Repeat phrase (0/2) 2 2  Language : Fluency (0/1) 0 0  Abstraction (0/2) 2 2  Delayed Recall (0/5) 2 3  Orientation (0/6) 5 5  Total 25 26  Adjusted Score (based on education) 25 26    Cranial nerve: no changes in taste or smell.  Pupils were equal round reactive to light. Extraocular movements were full,  With nystagmus. She seems to have a restricted facial movement. visual field were full on confrontational test. Facial sensation and strength were normal. Uvula  Midline. Tongue  Head turning and shoulder shrug  were normal and symmetric.She had developed oneadditional symptoms of parkinsonism-  I would like to add that there is mild titubation noted and that this trembling of the job became more evident than other muscle groups  were tested such as the arm extension. The tongue was not trembling, and there is no evidence of fasciculations. Motor: The motor testing reveals 4/5 strength of all 4 extremities. Elevated motor tone is noted throughout.   There is cogwheeling noted over the biceps with extension and flexion of the arm bilaterally similar to the same degreeCarmen Nila Winker, M.D. her  tremor has the same amplitude left and right. She appears slightly more rigid and with less facial expression. Sensory: Sensory testing is intact to soft touch on all 4 extremities. She reports no plantar numbness no pin and needle dysesthesias in her feet, the feet do not feel cold.  No evidence of extinction is noted. Coordination: Cerebellar testing reveals good finger-nose-finger bilaterally.  Gait and station: Gait without any armswing, bilaterally trembling hand and fingers, not pill rolling tremor.   Tandem gait is unsteady. Romberg is positive . No drift is seen.  Reflexes: Deep tendon reflexes are symmetric and normal bilaterally.   DIAGNOSTIC DATA (LABS, IMAGING, TESTING) - I reviewed patient records, labs, notes, testing and imaging myself where available.  Lab Results  Component Value Date   WBC 4.4 08/17/2014   HGB 14.2 08/17/2014   HCT 41.6 08/17/2014   MCV 94.0 08/17/2014   PLT 260.0 08/17/2014      Component Value Date/Time   NA 143 07/06/2015 1523   K 4.1 07/06/2015 1523   CL 103 07/06/2015 1523   CO2 29 07/06/2015 1523   GLUCOSE 144 (H) 07/06/2015 1523   BUN 10 07/06/2015 1523   CREATININE 0.87 07/06/2015 1523   CALCIUM 9.3 07/06/2015 1523   PROT 6.5 08/17/2014 1522   ALBUMIN 3.8 08/17/2014 1522   AST 16 08/17/2014 1522   ALT 16 08/17/2014 1522   ALKPHOS 64 08/17/2014 1522   BILITOT 0.4 08/17/2014 1522   GFRNONAA >60 03/22/2010 1251   GFRAA  03/22/2010 1251    >60        The eGFR has been calculated using the MDRD equation. This calculation has not been validated in all  clinical situations. eGFR's persistently <60 mL/min signify possible Chronic Kidney Disease.   Lab Results  Component Value Date   CHOL 160 08/17/2014   HDL 54.80 08/17/2014   LDLCALC 91 08/17/2014   TRIG 69.0 08/17/2014   CHOLHDL 3 08/17/2014   Lab Results  Component Value Date   HGBA1C 9.0 (H) 08/17/2014   Lab Results  Component Value Date   VITAMINB12 944 (H) 08/17/2014   Lab Results  Component Value Date   TSH 2.09 08/17/2014      ASSESSMENT AND PLAN 56 y.o. 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 Secondary Parkinsonism, with gait impairment - NUPLAZID may be an option.  Magda Paganini O'Neal  2. Diabetic dysautonomia, POTS ? Highly unusual for a patient with long history of childhood onset diabetes and POTS is the absence of sensory symptoms. Evaluate for possible MSA-   3. Subjective Memory disturbance, MOCA 25-30 . MMSE 29/30  4. Treated with antipsychotics for major depression. Begun dragging her feet. Mood is stabilized.   Overall the patient has remained stable. I have spend 25 minutes in the prolonged RV with memory testing,   More than 50% of this visit was face to face with coordination of care , discussion of symptoms and future developments to be expected.   Trail of Artane. triheyphenidyl 2 mg , take 1/2 tab in AM for 1 week, than a full tab for the following week, than 4 mg tha week after, 6 mg the week after .    [name], MD   04/02/2016, 4:04 PM [facility] [address] [place], [place] [address] [phone]  Cc Dr. Cathlean Cower , MD  Jill Poling, psychiatry

## 2016-04-02 NOTE — Addendum Note (Signed)
Addended by: [name] on: 04/02/2016 04:49 PM   Modules accepted: Orders

## 2016-04-03 LAB — COMPREHENSIVE METABOLIC PANEL
ALK PHOS: 88 IU/L (ref 39–117)
ALT: 19 IU/L (ref 0–32)
AST: 17 IU/L (ref 0–40)
Albumin/Globulin Ratio: 1.8 (ref 1.2–2.2)
Albumin: 4.1 g/dL (ref 3.5–5.5)
BUN/Creatinine Ratio: 23 (ref 9–23)
BUN: 22 mg/dL (ref 6–24)
Bilirubin Total: 0.3 mg/dL (ref 0.0–1.2)
CALCIUM: 9.6 mg/dL (ref 8.7–10.2)
CHLORIDE: 103 mmol/L (ref 96–106)
CO2: 26 mmol/L (ref 18–29)
Creatinine, Ser: 0.96 mg/dL (ref 0.57–1.00)
GFR calc Af Amer: 76 (ref >59)
GFR, EST NON AFRICAN AMERICAN: 66 (ref >59)
Globulin, Total: 2.3 (ref 1.5–4.5)
Glucose: 141 mg/dL — ABNORMAL HIGH (ref 65–99)
POTASSIUM: 4.2 mmol/L (ref 3.5–5.2)
SODIUM: 146 mmol/L — AB (ref 134–144)
Total Protein: 6.4 g/dL (ref 6.0–8.5)

## 2016-04-04 ENCOUNTER — Telehealth: Payer: Self-pay

## 2016-04-04 NOTE — Telephone Encounter (Signed)
I called pt to discuss her labs. No answer, left a message asking her to call me back. 

## 2016-04-04 NOTE — Telephone Encounter (Signed)
-----   Message from [name], MD sent at 04/03/2016  5:11 PM EST ----- Excellent lab results for a patient with DM type !. Can procede to MRI

## 2016-04-08 ENCOUNTER — Encounter: Payer: Self-pay | Admitting: Gastroenterology

## 2016-04-08 NOTE — Telephone Encounter (Signed)
I called pt. I advised her that Dr. Brett Fairy said that her labs were excellent and that the pt should proceed with her MRI. Pt verbalized understanding of results. Pt had no questions at this time but was encouraged to call back if questions arise.

## 2016-04-10 ENCOUNTER — Other Ambulatory Visit: Payer: Self-pay | Admitting: Internal Medicine

## 2016-04-22 ENCOUNTER — Other Ambulatory Visit: Payer: Self-pay

## 2016-05-06 ENCOUNTER — Ambulatory Visit
Admission: RE | Admit: 2016-05-06 | Discharge: 2016-05-06 | Disposition: A | Discharge status: Home / Self Care | Payer: Medicare Other | Source: Ambulatory Visit | Attending: Neurology | Admitting: Neurology

## 2016-05-06 DIAGNOSIS — G20 Parkinson's disease: Secondary | ICD-10-CM | POA: Diagnosis not present

## 2016-05-06 DIAGNOSIS — G903 Multi-system degeneration of the autonomic nervous system: Principal | ICD-10-CM

## 2016-05-06 MED ORDER — GADOBENATE DIMEGLUMINE 529 MG/ML IV SOLN
13.0000 mL | Freq: Once | INTRAVENOUS | Status: AC | PRN
  Administered 2016-05-06: 13 mL via INTRAVENOUS

## 2016-05-08 ENCOUNTER — Telehealth: Payer: Self-pay

## 2016-05-08 NOTE — Telephone Encounter (Signed)
I called pt to discuss MRI results. No answer, left a message asking her to call me back.

## 2016-05-08 NOTE — Telephone Encounter (Signed)
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 CD

## 2016-05-10 ENCOUNTER — Other Ambulatory Visit: Payer: Self-pay | Admitting: Internal Medicine

## 2016-05-12 NOTE — Telephone Encounter (Signed)
Pt returned RN's call °

## 2016-05-12 NOTE — Telephone Encounter (Signed)
Pt called back and has 2 questions.  1. Is there any way to slow brain atrophy such as brain exercises? I advised her that I did not think there is a way to slow atrophy but that I would run this by Dr. Brett Fairy to get her thoughts.  2. Is atrophy correlated with dementia and is this something the pt should look out for?  I offered to send these questions to the work in physician today since Dr. Brett Fairy is out of the office, but the pt would like Dr. Brett Fairy to handle these concerns upon her return on Thursday.

## 2016-05-12 NOTE — Telephone Encounter (Signed)
I called pt. I advised her that her MRI results showed mild generalized atophy but no brainstem abnormality for this pt with possible multi system atrophy. Pt verbalized understanding of results. Pt had no questions at this time but was encouraged to call back if questions arise. Pt says that she will keep her May appt with Jinny Blossom, NP.

## 2016-05-15 NOTE — Telephone Encounter (Signed)
I called the patient's home phone but there is no voicemail set up. I would have answered her questions the Following Way, #1 brain atrophy is usually a sign of hypertension, diabetes, hyperlipidemia, and can also be related to frequent migraines. This is a marked microvascular process. Atrophy can be correlated with non vascular dementia but is usually not generalized in that case.  The atrophy is usually related to Alzheimer's disease if marked along the perisylvian area and related to frontal dementia is the frontal lobe specifically shrunk, and in lewy  body dementia it is the occipital lobe that is  Affected.  Any brain activity, challenge and Lumosity are helpful in keeping an active mind, but they do not reverse brain atrophy or ward it of.   CD

## 2016-05-16 ENCOUNTER — Telehealth: Payer: Self-pay | Admitting: Neurology

## 2016-05-16 NOTE — Telephone Encounter (Signed)
Spoke to patient and essentially told her what was written in yesterdays phone note. She understood the concept of microvascular disease and her risk factors for this condition. CD

## 2016-05-16 NOTE — Telephone Encounter (Signed)
Pt is returning call from yesterday, I was unable to reach a RN to discuss the response from yesterday afternoon.  Pt would like a call back on her mobile#

## 2016-05-20 ENCOUNTER — Telehealth: Payer: Self-pay | Admitting: Neurology

## 2016-05-20 NOTE — Telephone Encounter (Signed)
Pt said re: her Rx of trihexyphenidyl (ARTANE) 2 MG tablet she thinks that the pharmacy  [id number] [place], [place]. [phone] (Phone) [phone] (Fax)   Gave her a 60 days supply and not 90 because she is almost out.  I asked her what did the pharmacy say. Pt said she wanted to hear from [place] [facility] before contacting the pharmacy.  She would like a call on her mobile#

## 2016-05-21 NOTE — Telephone Encounter (Signed)
I spoke to patient and she is doing well on the medication. She states that she forgot that she already picked up the Rx in March and at this time does not need a refill. She will call back if needs anything further.

## 2016-05-21 NOTE — Telephone Encounter (Signed)
Pt called back, says she was expecting a call from RN. I asked her if she has spoken to the pharmacy, she said no. I advised her to call pharmacy bc the RX reads 90 day supply. She would need to contact them to find out why she was only given #60. Pt said she would call back if she had any problems

## 2016-06-03 DIAGNOSIS — E1065 Type 1 diabetes mellitus with hyperglycemia: Secondary | ICD-10-CM | POA: Insufficient documentation

## 2016-06-12 ENCOUNTER — Telehealth: Payer: Self-pay | Admitting: Neurology

## 2016-06-12 MED ORDER — CARBIDOPA-LEVODOPA 25-100 MG PO TABS: 1.0000 | ORAL_TABLET | Freq: Two times a day (BID) | ORAL | 2 refills | Status: DC

## 2016-06-12 NOTE — Telephone Encounter (Signed)
I called pt. Her tremors have increased for the past 3-4 weeks. Pt notices her tremors all the time, even at rest, in her hands and legs. Pt is taking the artane 2mg  TID for the past 6 weeks. Pt has not increased her caffeine consumption.  Pt wants to know if there is anything else she can do to control the tremors, or new medications?

## 2016-06-12 NOTE — Telephone Encounter (Addendum)
I will ask her to d/c artane  ( more tremors ) and send her to a movement disorder clinic for a second opinion.   I just spoke to her and asked her to stop artane and try 100-25 mg carbi- levodopa twice a day. This is a trial to see if she has parkinsonian symptoms.

## 2016-06-12 NOTE — Addendum Note (Signed)
Addended by: [name] on: 06/12/2016 05:31 PM   Modules accepted: Orders

## 2016-06-12 NOTE — Telephone Encounter (Signed)
Patient called office in reference to trihexyphenidyl (ARTANE) 2 MG tablet patient tremors have gotten worse and shaking.  Symptoms have been going on for about 3-4 weeks.

## 2016-06-13 ENCOUNTER — Telehealth: Payer: Self-pay | Admitting: Neurology

## 2016-06-13 ENCOUNTER — Other Ambulatory Visit: Payer: Self-pay | Admitting: Internal Medicine

## 2016-06-13 MED ORDER — CARBIDOPA-LEVODOPA 25-100 MG PO TABS: 1.0000 | ORAL_TABLET | Freq: Two times a day (BID) | ORAL | 2 refills | Status: DC

## 2016-06-13 NOTE — Telephone Encounter (Signed)
Medication Detail    Disp Refills Start End   pindolol (VISKEN) 5 MG tablet 180 tablet 0 05/12/2016    Sig - Route: Take 1 tablet (5 mg total) by mouth 2 (two) times daily. *Please call and schedule a one year follow up appointment* - Oral   E-Prescribing Status: Receipt confirmed by pharmacy (05/12/2016 3:04 PM EDT)   Pharmacy   [id number] - [place], [place].

## 2016-06-13 NOTE — Telephone Encounter (Signed)
Sinemet  was called into her mail order pharmacy, [facility] have switched to [facility], I called patient informed her the change

## 2016-07-01 ENCOUNTER — Other Ambulatory Visit: Payer: Self-pay | Admitting: Internal Medicine

## 2016-07-02 ENCOUNTER — Encounter: Payer: Self-pay | Admitting: Adult Health

## 2016-07-02 ENCOUNTER — Encounter (INDEPENDENT_AMBULATORY_CARE_PROVIDER_SITE_OTHER): Payer: Self-pay

## 2016-07-02 ENCOUNTER — Ambulatory Visit (INDEPENDENT_AMBULATORY_CARE_PROVIDER_SITE_OTHER): Payer: Medicare Other | Admitting: Adult Health

## 2016-07-02 VITALS — BP 109/75 | HR 87 | Ht 67.0 in | Wt 137.4 lb

## 2016-07-02 DIAGNOSIS — G2111 Neuroleptic induced parkinsonism: Secondary | ICD-10-CM

## 2016-07-02 NOTE — Progress Notes (Signed)
I agree with the assessment and plan as directed by NP .The patient is known to me .   Lilymarie Scroggins, MD  

## 2016-07-02 NOTE — Progress Notes (Signed)
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 She returns today for follow-up. She was started on Sinemet at the last visit. She reports this has been beneficial. She states that her tremor has improved. Reports that her gait and balance have remained the same. Denies any falls. She does report decreased appetite and has lost weight since her last visit in February. She does report some trouble falling asleep. She reports that her tremor is primarily in the jaw and tongue. Denies any new neurological symptoms. He returns today for an evaluation.  HISTORY 04/02/16 Copied from Dr. Brett Fairy: there are times when my mouth, jaw and tongue trembling. Reglan induced? Lynn Lynn Barnes has not used Reglan in the past., Her depression however has been treated with Geodon and Risperdal. Neuroleptic induced.   She has been evaluated for parkinsonism secondary induced due to medication here before, and one of her concerns is memory loss, the feeling of not tracking thought process all the way through. I evaluated her memory last 8 year ago in Lynn Barnes 2017 when she scored 26 out of 30 points on a [place] cognitive assessment. Today she scored 25 out of 30 and I will print those results for comparison below. I do not think that Lynn Lynn Barnes has early onset dementia but maybe a cognitive disorder. She should take prenatal vitamins and I will try Artane for tremor.     REVIEW OF SYSTEMS: Out of a complete 14 system review of symptoms, the patient complains only of the following symptoms, and all other reviewed systems are negative.  Tremors, passing out, agitation, decreased concentration, depression, nervous/anxious, walking difficulty, restless leg, insomnia,, frequent waking, snoring, eye discharge, appetite change  ALLERGIES: Allergies    Allergen Reactions  . Nsaids Other (See Comments)    Irritates stomach  . Sulfonamide Derivatives Rash    HOME MEDICATIONS: Outpatient Medications Prior to Visit  Medication Sig Dispense Refill  . atorvastatin (LIPITOR) 40 MG tablet TAKE 1 TABLET BY MOUTH EVERY DAY 30 tablet 0  . atorvastatin (LIPITOR) 40 MG tablet TAKE 1 TABLET BY MOUTH  DAILY 90 tablet 0  . carbidopa-levodopa (SINEMET IR) 25-100 MG tablet Take 1 tablet by mouth 2 (two) times daily. 60 tablet 2  . clonazePAM (KLONOPIN) 1 MG tablet Take 1 mg by mouth 2 (two) times daily as needed for anxiety.    . fludrocortisone (FLORINEF) 0.1 MG tablet Take 0.5 tablets (0.05 mg total) by mouth 2 (two) times daily. 90 tablet 0  . glucose blood test strip 1 each by Other route. 7 times per day   - as instructed    . LATUDA 40 MG TABS tablet TAKE 1 TABLET BY MOUTH WITH MEAL  0  . NOVOLOG 100 UNIT/ML injection Insulin pump    . pindolol (VISKEN) 5 MG tablet TAKE 1 TABLET BY MOUTH TWO  TIMES DAILY 180 tablet 0  . pindolol (VISKEN) 5 MG tablet Take 1 tablet (5 mg total) by mouth 2 (two) times daily. *Please call and schedule a one year follow up appointment* 180 tablet 0  . risperidone (RISPERDAL) 4 MG tablet Take 8 mg by mouth at bedtime.  0  . zolpidem (AMBIEN) 10 MG tablet Take 10 mg by mouth at bedtime.  1   No facility-administered medications prior to visit.     PAST MEDICAL HISTORY:  Past Medical History:  Diagnosis Date  . 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 ([place])    opium  . Addiction, opium ([place])   . Allergy    spring  . Anxiety    ADHD CHRONIC ANXIETY  . 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 Bipolar disorder ([place]) 02/15/2011  . Cervical spondylosis without myelopathy   . Depression   . Depression   . Diabetes mellitus    sees Dr Altheimer/endo  . Dysautonomia 10/24/2011  . Dyskinesia     subacute,due to drugs  . Dyslipidemia   . History of nuclear stress test    Myoview 11/16: EF 81%, no ischemia; Low Risk  .  Hyperlipidemia   . IBS (irritable bowel syndrome) 02/15/2011  . Insomnia 02/21/2011  . Multinodular thyroid    with large dominant solid nodule with calcifications in the lower left pole benign by FNA in 1/12 and stable by repeat ulltrasound in 4/13  . Neuropathy (H[facility])    with paresthesias in feet  . Orthostatic hypotension   . Orthostatic hypotension   . Parkinsonism ([place])    due to prolonged prozac/risperdl for yrs/Dr Dohmeir  . Parkinsonism ([place])   . POTS (postural orthostatic tachycardia syndrome)   . Radiculopathy    cervical  . Resting tremor   . Rotator cuff syndrome of left shoulder   . S/P partial hysterectomy   . Spondylosis without myelopathy   . Subacute dyskinesia due to drugs(333.85)    dr dohmeier/neuro  . Tachycardia    syndrome  . Therapy    on therapy by psychiatrist using sebutex   . Thyroid nodule    s/p biopsy - benign approx jan 2012  . Tremor     PAST SURGICAL HISTORY: Past Surgical History:  Procedure Laterality Date  . ACROMIOPLASTY Left 2002   Dr Gladstone Lighter  . BREAST LUMPECTOMY Right   . herniated disc cervical spine    . lower back  1992  . OOPHORECTOMY    . PARTIAL HYSTERECTOMY    . SPINE SURGERY  feb 2012   c5-6 fusion, Dr Rolena Infante  . THYROID LOBECTOMY  11/11   nodule biopsy, benign    FAMILY HISTORY: Family History  Problem Relation Age of Onset  . Lung cancer Mother   . Heart disease Father   . Hypertension Father   . Heart disease Paternal Grandmother   . ALS Paternal Grandmother   . Hypertension Sister     SOCIAL HISTORY: Social History   Social History  . Marital status: Single    Spouse name: N/A  . Number of children: 0  . Years of education: BA   Occupational History  . [employer]   Social History Main Topics  . Smoking status: Never Smoker  . Smokeless tobacco: Never Used  . Alcohol use No  . Drug use: No  . Sexual activity: No   Other Topics Concern  . Not on file   Social History  Narrative   Patient is single and lives with my partner ([place]).   Patient is disabled.   Patient is right-handed.   Patient has a Haematologist.   Patient drinks one soda every other day.      PHYSICAL EXAM  Vitals:   07/02/16 1411  BP: 109/75  Pulse: 87  Weight: 137 lb 6.4 oz (62.3 kg)  Height: 5\' 7"  (1.702 m)   Body mass index is 21.52 kg/m.  Generalized: Well developed, in no acute distress   Neurological  examination  Mentation: Alert oriented to time, place, history taking. Follows all commands. Mask face. Cranial nerve II-XII: Pupils were equal round reactive to light. Extraocular movements were full, visual field were full on confrontational test. Facial sensation and strength were normal. Uvula tongue midline. Head turning and shoulder shrug  were normal and symmetric. Motor: The motor testing reveals 5 over 5 strength of all 4 extremities. Good symmetric motor tone is noted throughout.  Sensory: Sensory testing is intact to soft touch on all 4 extremities. No evidence of extinction is noted.  Coordination: Cerebellar testing reveals good finger-nose-finger and heel-to-shin bilaterally.  Gait and station: Patient has decreased arm swing. Gait is unsteady. Slightly stooped posture. Reflexes: Deep tendon reflexes are symmetric and normal bilaterally.   DIAGNOSTIC DATA (LABS, IMAGING, TESTING) - I reviewed patient records, labs, notes, testing and imaging myself where available.  Lab Results  Component Value Date   WBC 4.4 08/17/2014   HGB 14.2 08/17/2014   HCT 41.6 08/17/2014   MCV 94.0 08/17/2014   PLT 260.0 08/17/2014      Component Value Date/Time   NA 146 (H) 04/02/2016 1659   K 4.2 04/02/2016 1659   CL 103 04/02/2016 1659   CO2 26 04/02/2016 1659   GLUCOSE 141 (H) 04/02/2016 1659   GLUCOSE 144 (H) 07/06/2015 1523   BUN 22 04/02/2016 1659   CREATININE 0.96 04/02/2016 1659   CREATININE 0.87 07/06/2015 1523   CALCIUM 9.6 04/02/2016 1659    PROT 6.4 04/02/2016 1659   ALBUMIN 4.1 04/02/2016 1659   AST 17 04/02/2016 1659   ALT 19 04/02/2016 1659   ALKPHOS 88 04/02/2016 1659   BILITOT 0.3 04/02/2016 1659   GFRNONAA 66 04/02/2016 1659   GFRAA 76 04/02/2016 1659   Lab Results  Component Value Date   CHOL 160 08/17/2014   HDL 54.80 08/17/2014   LDLCALC 91 08/17/2014   TRIG 69.0 08/17/2014   CHOLHDL 3 08/17/2014   Lab Results  Component Value Date   HGBA1C 9.0 (H) 08/17/2014   Lab Results  Component Value Date   VITAMINB12 944 (H) 08/17/2014   Lab Results  Component Value Date   TSH 2.09 08/17/2014      ASSESSMENT AND PLAN 56 y.o. 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[place]); Bipolar disorder ([place]) (02/15/2011); Cervical spondylosis without myelopathy; Depression; Depression; Diabetes mellitus; Dysautonomia (10/24/2011); Dyskinesia; Dyslipidemia; History of nuclear stress test; Hyperlipidemia; IBS (irritable bowel syndrome) (02/15/2011); Insomnia (02/21/2011); Multinodular thyroid; Neuropathy ([place]); Orthostatic hypotension; Orthostatic hypotension; Parkinsonism ([place]); Parkinsonism ([place]); POTS (postural orthostatic tachycardia syndrome); Radiculopathy; Resting tremor; Rotator cuff syndrome of left shoulder; S/P partial hysterectomy; Spondylosis without myelopathy; Subacute dyskinesia due to drugs(333.85); Tachycardia; Therapy; Thyroid nodule; and Tremor. here with:  1. Parkinsonism  The patient feels that Sinemet has been beneficial for her she will continue on Sinemet 25-100 mg twice a day. Advised that if her symptoms worsen or she develops new symptoms she should let us know. She will follow-up in 6 months with Dr. Brett Fairy  I spent 15 minutes with the patient 50% of this time was spent discussing patient's medication.     Ward Givens, MSN, NP-C 07/02/2016, 2:23 PM [facility] [address], [place], [place] [address] [phone]

## 2016-07-02 NOTE — Patient Instructions (Signed)
Continue Sinemet If your symptoms worsen or you develop new symptoms please let us know.   

## 2016-07-27 ENCOUNTER — Other Ambulatory Visit: Payer: Self-pay | Admitting: Neurology

## 2016-07-31 NOTE — Progress Notes (Signed)
Cardiology Office Note   Date:  08/01/2016   ID:  Lynn Barnes, DOB 1960-06-28, MRN [id number]  PCP:  Biagio Borg, MD  Cardiologist:   Dorris Carnes, MD    Pt returns for f/u of autonomic dysfunction.   History of Present Illness: Lynn Barnes is a 56 y.o. female with a history of parkinsons and autonomic dysfunciton   I saw her in May 2017  Symptoms of dizziness worse at that time  She says she has some dizziness now but it is not bad.  No syncope    Stays hydrated      Current Meds  Medication Sig  . atorvastatin (LIPITOR) 40 MG tablet TAKE 1 TABLET BY MOUTH  DAILY  . carbidopa-levodopa (SINEMET IR) 25-100 MG tablet Take 1 tablet by mouth 2 (two) times daily.  . clonazePAM (KLONOPIN) 1 MG tablet Take 1 mg by mouth 3 (three) times daily.   . fludrocortisone (FLORINEF) 0.1 MG tablet Take 0.5 tablets (0.05 mg total) by mouth 2 (two) times daily.  [name] glucose blood test strip 1 each by Other route. 7 times per day   - as instructed  . LATUDA 80 MG TABS tablet TAKE 1 TABLET BY MOUTH WITH MEAL  . NOVOLOG 100 UNIT/ML injection Insulin pump  . pindolol (VISKEN) 5 MG tablet TAKE 1 TABLET BY MOUTH TWO  TIMES DAILY  . risperidone (RISPERDAL) 4 MG tablet Take 8 mg by mouth at bedtime.  [name] zolpidem (AMBIEN) 10 MG tablet Take 10 mg by mouth at bedtime.     Allergies:   Artane [trihexyphenidyl]; Nsaids; and Sulfonamide derivatives   Past Medical History:  Diagnosis Date  . 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 ([place])    opium  . Addiction, opium ([place])   . Allergy    spring  . Anxiety    ADHD CHRONIC ANXIETY  . 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 Bipolar disorder ([place]) 02/15/2011  . Cervical spondylosis without myelopathy   . Depression   . Depression   . Diabetes mellitus    sees Dr Altheimer/endo  . Dysautonomia 10/24/2011  . Dyskinesia     subacute,due to drugs  . Dyslipidemia   . History of nuclear stress test    Myoview 11/16: EF 81%, no ischemia; Low Risk  . Hyperlipidemia     . IBS (irritable bowel syndrome) 02/15/2011  . Insomnia 02/21/2011  . Multinodular thyroid    with large dominant solid nodule with calcifications in the lower left pole benign by FNA in 1/12 and stable by repeat ulltrasound in 4/13  . Neuropathy    with paresthesias in feet  . Orthostatic hypotension   . Orthostatic hypotension   . Parkinsonism ([place])    due to prolonged prozac/risperdl for yrs/Dr Dohmeir  . Parkinsonism ([place])   . POTS (postural orthostatic tachycardia syndrome)   . Radiculopathy    cervical  . Resting tremor   . Rotator cuff syndrome of left shoulder   . S/P partial hysterectomy   . Spondylosis without myelopathy   . Subacute dyskinesia due to drugs(333.85)    dr dohmeier/neuro  . Tachycardia    syndrome  . Therapy    on therapy by psychiatrist using sebutex   . Thyroid nodule    s/p biopsy - benign approx jan 2012  . Tremor     Past Surgical History:  Procedure Laterality Date  . ACROMIOPLASTY Left 2002   Dr Gladstone Lighter  . BREAST LUMPECTOMY Right   .  herniated disc cervical spine    . lower back  1992  . OOPHORECTOMY    . PARTIAL HYSTERECTOMY    . SPINE SURGERY  feb 2012   c5-6 fusion, Dr Rolena Infante  . THYROID LOBECTOMY  11/11   nodule biopsy, benign     Social History:  The patient  reports that she has never smoked. She has never used smokeless tobacco. She reports that she does not drink alcohol or use drugs.   Family History:  The patient's family history includes ALS in her paternal grandmother; Heart disease in her father and paternal grandmother; Hypertension in her father and sister; Lung cancer in her mother.    ROS:  Please see the history of present illness. All other systems are reviewed and  Negative to the above problem except as noted.    PHYSICAL EXAM: VS:  BP 126/80   Pulse 86   Ht 5\' 7"  (1.702 m)   Wt 64 kg (141 lb)   SpO2 95%   BMI 22.08 kg/m   GEN: Well nourished, well developed, in no acute distress  HEENT: normal  Neck: no  JVD, carotid bruits, or masses Cardiac: RRR; no murmurs, rubs, or gallops,no edema  Respiratory:  clear to auscultation bilaterally, normal work of breathing GI: soft, nontender, nondistended, + BS  No hepatomegaly  MS: no deformity Moving all extremities   Skin: warm and dry, no rash Neuro:  Strength and sensation are intact Psych: euthymic mood, full affect   EKG:  EKG is ordered today.  SR 79 bpm     Lipid Panel    Component Value Date/Time   CHOL 160 08/17/2014 1522   TRIG 69.0 08/17/2014 1522   HDL 54.80 08/17/2014 1522   CHOLHDL 3 08/17/2014 1522   VLDL 13.8 08/17/2014 1522   LDLCALC 91 08/17/2014 1522      Wt Readings from Last 3 Encounters:  08/01/16 64 kg (141 lb)  07/02/16 62.3 kg (137 lb 6.4 oz)  04/02/16 64.9 kg (143 lb)      ASSESSMENT AND PLAN:  1  Autonomic dysfunction  Pt's symptoms appear to be relatively controlloed  I would keep on Pindolol and florinef  Potassium in Feb 4.2  2  Dyslipidemia  On lipitor  Will have her come back to get fasting lipids       Current medicines are reviewed at length with the patient today.  The patient does not have concerns regarding medicines.  Signed, Dorris Carnes, MD  08/01/2016 4:06 PM    [facility] [place], [place], [place]  [address] Phone: [phone]; Fax: [phone]

## 2016-08-01 ENCOUNTER — Ambulatory Visit (INDEPENDENT_AMBULATORY_CARE_PROVIDER_SITE_OTHER): Payer: Medicare Other | Admitting: Internal Medicine

## 2016-08-01 ENCOUNTER — Encounter: Payer: Self-pay | Admitting: Internal Medicine

## 2016-08-01 VITALS — BP 126/80 | HR 86 | Ht 67.0 in | Wt 141.0 lb

## 2016-08-01 DIAGNOSIS — E782 Mixed hyperlipidemia: Secondary | ICD-10-CM

## 2016-08-01 DIAGNOSIS — G909 Disorder of the autonomic nervous system, unspecified: Secondary | ICD-10-CM

## 2016-08-01 DIAGNOSIS — G901 Familial dysautonomia [Riley-Day]: Secondary | ICD-10-CM

## 2016-08-01 NOTE — Patient Instructions (Signed)
Keep on same medications Stay hydrated Will arrange to have you in call back for appointment in March 2019.

## 2016-08-04 ENCOUNTER — Other Ambulatory Visit: Payer: Self-pay | Admitting: Neurology

## 2016-08-04 ENCOUNTER — Other Ambulatory Visit: Payer: Self-pay | Admitting: Internal Medicine

## 2016-08-05 ENCOUNTER — Other Ambulatory Visit: Payer: Self-pay | Admitting: Neurology

## 2016-08-05 MED ORDER — CARBIDOPA-LEVODOPA 25-100 MG PO TABS: 1.0000 | ORAL_TABLET | Freq: Two times a day (BID) | ORAL | 1 refills | Status: DC

## 2016-08-05 NOTE — Telephone Encounter (Signed)
Patient called office in reference to refill for carbidopa-levodopa (SINEMET IR) 25-100 MG tablet.  Patient states the request we received was denied thru [facility] advised patient she has refill sitting at [place], per patient she does not use this pharmacy.  Pharmacy- [facility].

## 2016-08-05 NOTE — Addendum Note (Signed)
Addended by: Lester Ozark A on: 08/05/2016 10:29 AM   Modules accepted: Orders

## 2016-08-05 NOTE — Addendum Note (Signed)
Addended by: Mendel Ryder on: 08/05/2016 08:05 AM   Modules accepted: Orders

## 2016-08-05 NOTE — Telephone Encounter (Signed)
Pt calling back now to see if Dr Brett Fairy has approved the refill request through [facility], she is asking to be called

## 2016-08-05 NOTE — Telephone Encounter (Signed)
I called pt and advised her that Dr. Brett Fairy did approve the sinemet 90 day supply and sent it to [facility]. Pt verbalized understanding.

## 2016-08-07 NOTE — Progress Notes (Signed)
Patient has appointment in July with Dr. Jenny Reichmann (PCP).  Labs are ordered for that time.

## 2016-08-08 ENCOUNTER — Other Ambulatory Visit: Payer: Self-pay | Admitting: Internal Medicine

## 2016-08-08 DIAGNOSIS — Z1231 Encounter for screening mammogram for malignant neoplasm of breast: Secondary | ICD-10-CM

## 2016-08-20 ENCOUNTER — Ambulatory Visit
Admission: RE | Admit: 2016-08-20 | Discharge: 2016-08-20 | Disposition: A | Discharge status: Home / Self Care | Payer: Medicare Other | Source: Ambulatory Visit | Attending: Internal Medicine | Admitting: Internal Medicine

## 2016-08-20 DIAGNOSIS — Z1231 Encounter for screening mammogram for malignant neoplasm of breast: Secondary | ICD-10-CM

## 2016-08-21 ENCOUNTER — Encounter: Payer: Self-pay | Admitting: Internal Medicine

## 2016-08-21 ENCOUNTER — Other Ambulatory Visit (INDEPENDENT_AMBULATORY_CARE_PROVIDER_SITE_OTHER): Payer: Medicare Other

## 2016-08-21 ENCOUNTER — Ambulatory Visit (INDEPENDENT_AMBULATORY_CARE_PROVIDER_SITE_OTHER): Payer: Medicare Other | Admitting: Internal Medicine

## 2016-08-21 VITALS — BP 136/84 | HR 94 | Ht 67.0 in | Wt 137.0 lb

## 2016-08-21 DIAGNOSIS — Z0000 Encounter for general adult medical examination without abnormal findings: Secondary | ICD-10-CM

## 2016-08-21 DIAGNOSIS — Z114 Encounter for screening for human immunodeficiency virus [HIV]: Secondary | ICD-10-CM | POA: Diagnosis not present

## 2016-08-21 DIAGNOSIS — Z1159 Encounter for screening for other viral diseases: Secondary | ICD-10-CM | POA: Diagnosis not present

## 2016-08-21 DIAGNOSIS — Z23 Encounter for immunization: Secondary | ICD-10-CM

## 2016-08-21 LAB — CBC WITH DIFFERENTIAL/PLATELET
BASOS ABS: 0 10*3/uL (ref 0.0–0.1)
Basophils Relative: 0.9 % (ref 0.0–3.0)
EOS PCT: 0.2 % (ref 0.0–5.0)
Eosinophils Absolute: 0 10*3/uL (ref 0.0–0.7)
HCT: 42 % (ref 36.0–46.0)
HEMOGLOBIN: 14.5 g/dL (ref 12.0–15.0)
Lymphocytes Relative: 28.2 % (ref 12.0–46.0)
Lymphs Abs: 1.4 10*3/uL (ref 0.7–4.0)
MCHC: 34.6 g/dL (ref 30.0–36.0)
MCV: 92.6 fl (ref 78.0–100.0)
MONO ABS: 0.3 10*3/uL (ref 0.1–1.0)
Monocytes Relative: 6.8 % (ref 3.0–12.0)
NEUTROS PCT: 63.9 % (ref 43.0–77.0)
Neutro Abs: 3.1 10*3/uL (ref 1.4–7.7)
Platelets: 281 10*3/uL (ref 150.0–400.0)
RBC: 4.53 Mil/uL (ref 3.87–5.11)
RDW: 12.5 % (ref 11.5–15.5)
WBC: 4.8 10*3/uL (ref 4.0–10.5)

## 2016-08-21 LAB — TSH: TSH: 1.57 u[IU]/mL (ref 0.35–4.50)

## 2016-08-21 NOTE — Patient Instructions (Addendum)
You had the Prevnar 13 pneumonia shot today  Please continue all other medications as before, and refills have been done if requested.  Please have the pharmacy call with any other refills you may need.  Please continue your efforts at being more active, low cholesterol diet, and weight control.  You are otherwise up to date with prevention measures today.  Please keep your appointments with your specialists as you may have planned  You will be contacted regarding the referral for: colonoscopy  Please go to the LAB in the Basement (turn left off the elevator) for the tests to be done today  You will be contacted by phone if any changes need to be made immediately.  Otherwise, you will receive a letter about your results with an explanation, but please check with MyChart first.  Please remember to sign up for MyChart if you have not done so, as this will be important to you in the future with finding out test results, communicating by private email, and scheduling acute appointments online when needed.  Please return in 1 year for your yearly visit, or sooner if needed

## 2016-08-21 NOTE — Progress Notes (Signed)
Subjective:    Patient ID: Lynn Barnes, female    DOB: 24-Mar-1960, 56 y.o.   MRN: [id number]  HPI  Here for wellness and f/u;  Overall doing ok;  Pt denies Chest pain, worsening SOB, DOE, wheezing, orthopnea, PND, worsening LE edema, palpitations, dizziness or syncope.  Pt denies neurological change such as new headache, facial or extremity weakness.  Pt denies polydipsia, polyuria, or low sugar symptoms. Pt states overall good compliance with treatment and medications, good tolerability, and has been trying to follow appropriate diet.  Pt denies worsening depressive symptoms, suicidal ideation or panic. No fever, night sweats, wt loss, loss of appetite, or other constitutional symptoms.  Pt states good ability with ADL's, has low fall risk, home safety reviewed and adequate, no other significant changes in hearing or vision, and not active with exercise. , but wt overall stable.  Sees neurology, cardiology, endo (dr Altheimer) and psychiatry on regular basis. Last a1c was 3 mo ago, has done every 3 mo. A2c apri 2018 7.3. Last episode syncope was about 2 mo ago.  No other new hx.  Due for certain labs, prevnar 13 and f/u colonosocpy  April 2018 LDL was 215 per endo. Past Medical History:  Diagnosis Date  . 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 ([place])    opium  . Addiction, opium ([place])   . Allergy    spring  . Anxiety    ADHD CHRONIC ANXIETY  . 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 Bipolar disorder ([place]) 02/15/2011  . Cervical spondylosis without myelopathy   . Depression   . Depression   . Diabetes mellitus    sees Dr Altheimer/endo  . Dysautonomia 10/24/2011  . Dyskinesia     subacute,due to drugs  . Dyslipidemia   . History of nuclear stress test    Myoview 11/16: EF 81%, no ischemia; Low Risk  . Hyperlipidemia   . IBS (irritable bowel syndrome) 02/15/2011  . Insomnia 02/21/2011  . Multinodular thyroid    with large dominant solid nodule with calcifications in the lower left pole benign by FNA  in 1/12 and stable by repeat ulltrasound in 4/13  . Neuropathy    with paresthesias in feet  . Orthostatic hypotension   . Orthostatic hypotension   . 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 Parkinsonism ([place])   . POTS (postural orthostatic tachycardia syndrome)   . Radiculopathy    cervical  . Resting tremor   . Rotator cuff syndrome of left shoulder   . S/P partial hysterectomy   . Spondylosis without myelopathy   . Subacute dyskinesia due to drugs(333.85)    dr dohmeier/neuro  . Tachycardia    syndrome  . Therapy    on therapy by psychiatrist using sebutex   . Thyroid nodule    s/p biopsy - benign approx jan 2012  . Tremor    Past Surgical History:  Procedure Laterality Date  . ACROMIOPLASTY Left 2002   Dr Gladstone Lighter  . BREAST EXCISIONAL BIOPSY Right    benign  . BREAST LUMPECTOMY Right   . herniated disc cervical spine    . lower back  1992  . OOPHORECTOMY    . PARTIAL HYSTERECTOMY    . SPINE SURGERY  feb 2012   c5-6 fusion, Dr Rolena Infante  . THYROID LOBECTOMY  11/11   nodule biopsy, benign    reports that she has never smoked. She has never used smokeless tobacco. She reports that  she does not drink alcohol or use drugs. family history includes ALS in her paternal grandmother; Heart disease in her father and paternal grandmother; Hypertension in her father and sister; Lung cancer in her mother. Allergies  Allergen Reactions  . Artane [Trihexyphenidyl]     Made tremors worse.   . Nsaids Other (See Comments)    Irritates stomach  . Sulfonamide Derivatives Rash   Current Outpatient Prescriptions on File Prior to Visit  Medication Sig Dispense Refill  . atorvastatin (LIPITOR) 40 MG tablet TAKE 1 TABLET BY MOUTH  DAILY 90 tablet 0  . carbidopa-levodopa (SINEMET IR) 25-100 MG tablet Take 1 tablet by mouth 2 (two) times daily. 180 tablet 1  . clonazePAM (KLONOPIN) 1 MG tablet Take 1 mg by mouth 3 (three) times daily.     .  fludrocortisone (FLORINEF) 0.1 MG tablet TAKE ONE-HALF TABLET BY  MOUTH TWO TIMES DAILY 90 tablet 3  . glucose blood test strip 1 each by Other route. 7 times per day   - as instructed    . LATUDA 80 MG TABS tablet TAKE 1 TABLET BY MOUTH WITH MEAL  0  . NOVOLOG 100 UNIT/ML injection Insulin pump    . pindolol (VISKEN) 5 MG tablet TAKE 1 TABLET BY MOUTH TWO  TIMES DAILY 180 tablet 0  . risperidone (RISPERDAL) 4 MG tablet Take 8 mg by mouth at bedtime.  0  . zolpidem (AMBIEN) 10 MG tablet Take 10 mg by mouth at bedtime.  1   No current facility-administered medications on file prior to visit.     Review of Systems Constitutional: Negative for other unusual diaphoresis, sweats, appetite or weight changes HENT: Negative for other worsening hearing loss, ear pain, facial swelling, mouth sores or neck stiffness.   Eyes: Negative for other worsening pain, redness or other visual disturbance.  Respiratory: Negative for other stridor or swelling Cardiovascular: Negative for other palpitations or other chest pain  Gastrointestinal: Negative for worsening diarrhea or loose stools, blood in stool, distention or other pain Genitourinary: Negative for hematuria, flank pain or other change in urine volume.  Musculoskeletal: Negative for myalgias or other joint swelling.  Skin: Negative for other color change, or other wound or worsening drainage.  Neurological: Negative for other numbness. Hematological: Negative for other adenopathy or swelling Psychiatric/Behavioral: Negative for hallucinations, other worsening agitation, SI, self-injury, or new decreased concentration All other system neg per pt    Objective:   Physical Exam BP 136/84   Pulse 94   Ht 5\' 7"  (1.702 m)   Wt 137 lb (62.1 kg)   SpO2 98%   BMI 21.46 kg/m  VS noted,  Constitutional: Pt is oriented to person, place, and time. Appears well-developed and well-nourished, in no significant distress and comfortable Head: Normocephalic  and atraumatic  Eyes: Conjunctivae and EOM are normal. Pupils are equal, round, and reactive to light Right Ear: External ear normal without discharge Left Ear: External ear normal without discharge Nose: Nose without discharge or deformity Mouth/Throat: Oropharynx is without other ulcerations and moist  Neck: Normal range of motion. Neck supple. No JVD present. No tracheal deviation present or significant neck LA or mass Cardiovascular: Normal rate, regular rhythm, normal heart sounds and intact distal pulses.   Pulmonary/Chest: WOB normal and breath sounds without rales or wheezing  Abdominal: Soft. Bowel sounds are normal. NT. No HSM  Musculoskeletal: Normal range of motion. Exhibits no edema Lymphadenopathy: Has no other cervical adenopathy.  Neurological: Pt is alert and oriented to  person, place, and time. Pt has normal reflexes. No cranial nerve deficit. Motor grossly intact, Gait intact Skin: Skin is warm and dry. No rash noted or new ulcerations, wears insulin pump to right arm, and continuous glucose monitor to RLQ Psychiatric:  Has normal mood and affect. Behavior is normal without agitation No other exam findings  Lab Results  Component Value Date   WBC 4.4 08/17/2014   HGB 14.2 08/17/2014   HCT 41.6 08/17/2014   PLT 260.0 08/17/2014   GLUCOSE 141 (H) 04/02/2016   CHOL 160 08/17/2014   TRIG 69.0 08/17/2014   HDL 54.80 08/17/2014   LDLCALC 91 08/17/2014   ALT 19 04/02/2016   AST 17 04/02/2016   NA 146 (H) 04/02/2016   K 4.2 04/02/2016   CL 103 04/02/2016   CREATININE 0.96 04/02/2016   BUN 22 04/02/2016   CO2 26 04/02/2016   TSH 2.09 08/17/2014   HGBA1C 9.0 (H) 08/17/2014       Assessment & Plan:

## 2016-08-21 NOTE — Assessment & Plan Note (Signed)

## 2016-08-22 LAB — HEPATITIS C ANTIBODY: HCV Ab: NEGATIVE

## 2016-08-24 LAB — HIV 1/2 CONFIRMATION
HIV 1 ANTIBODY: NEGATIVE
HIV 2 AB: NEGATIVE

## 2016-08-24 LAB — HIV ANTIBODY (ROUTINE TESTING W REFLEX): HIV 1&2 Ab, 4th Generation: REACTIVE — AB

## 2016-08-28 LAB — HIV-1 RNA, QUALITATIVE, TMA: HIV-1 RNA, Qualitative, TMA: NOT DETECTED

## 2016-09-12 ENCOUNTER — Other Ambulatory Visit: Payer: Self-pay | Admitting: Internal Medicine

## 2016-09-22 ENCOUNTER — Other Ambulatory Visit: Payer: Self-pay | Admitting: Internal Medicine

## 2016-10-06 ENCOUNTER — Other Ambulatory Visit: Payer: Self-pay | Admitting: Internal Medicine

## 2016-11-10 ENCOUNTER — Encounter: Payer: Self-pay | Admitting: Internal Medicine

## 2016-11-18 ENCOUNTER — Other Ambulatory Visit: Payer: Self-pay | Admitting: Neurology

## 2017-01-06 ENCOUNTER — Ambulatory Visit: Payer: Medicare Other | Admitting: Neurology

## 2017-01-06 ENCOUNTER — Encounter (INDEPENDENT_AMBULATORY_CARE_PROVIDER_SITE_OTHER): Payer: Self-pay

## 2017-01-06 ENCOUNTER — Encounter: Payer: Self-pay | Admitting: Neurology

## 2017-01-06 VITALS — BP 117/71 | HR 91 | Ht 67.0 in | Wt 152.5 lb

## 2017-01-06 DIAGNOSIS — R000 Tachycardia, unspecified: Secondary | ICD-10-CM | POA: Diagnosis not present

## 2017-01-06 DIAGNOSIS — G90A Postural orthostatic tachycardia syndrome (POTS): Secondary | ICD-10-CM

## 2017-01-06 DIAGNOSIS — G2119 Other drug induced secondary parkinsonism: Secondary | ICD-10-CM

## 2017-01-06 DIAGNOSIS — I951 Orthostatic hypotension: Secondary | ICD-10-CM

## 2017-01-06 MED ORDER — CARBIDOPA-LEVODOPA 25-100 MG PO TABS: ORAL_TABLET | ORAL | 1 refills | Status: DC

## 2017-01-06 NOTE — Progress Notes (Signed)
PATIENT: Lynn Barnes DOB: 1960/03/16  REASON FOR VISIT: follow up HISTORY FROM: patient  PCP : Dr Cathlean Cower, MD   Lorne Skeens, MD   HISTORY OF PRESENT ILLNESS: I have the pleasure of meeting today again with Lynn Barnes, meanwhile 56 year old patient, I had placed on carbidopa levodopa after she presented in February with mouth, jaw and tongue trembling and tremors.  She feels that this has been beneficial .  She is calmer without any obvious tremor today she still has vocal cord Trembling.  She also reports a greater fluidity of movement.  She had no further POTs related problems, I had always suspected that her dysautonomia was related to her juvenile diabetes long-standing insulin dependence.  However, I would not expect dysautonomia to get better on Sinemet. Shy- Dra[place] ?     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 She returns today for follow-up. She was started on Sinemet at the last visit. She reports this has been beneficial. She states that her tremor has improved. Reports that her gait and balance have remained the same. Denies any falls. She does report decreased appetite and has lost weight since her last visit in February. She does report some trouble falling asleep. She reports that her tremor is primarily in the jaw and tongue. Denies any new neurological symptoms. He returns today for an evaluation.  HISTORY 04/02/16 Copied from Dr. Brett Fairy: there are times when my mouth, jaw and tongue trembling. Reglan induced? Lynn Barnes has not used Reglan in the past., Her depression however has been treated with Geodon and Risperdal. Neuroleptic induced.   She has been evaluated for parkinsonism secondary induced due to medication here before, and one of her concerns is memory loss, the feeling of not tracking thought process all the way through. I evaluated her memory last 8 year ago in Barnes 2017 when she scored 26  out of 30 points on a [place] cognitive assessment. Today she scored 25 out of 30 and I will print those results for comparison below. I do not think that Lynn Barnes has early onset dementia but maybe a cognitive disorder. She should take prenatal vitamins and I will try Artane for tremor.     REVIEW OF SYSTEMS: Out of a complete 14 system review of symptoms, the patient complains only of the following symptoms, and all other reviewed systems are negative.  Unchanged taste, vision and hearing and smell. improved tremor, improved POTS.  Improved diabetes control.   ALLERGIES: Allergies  Allergen Reactions  . Artane [Trihexyphenidyl]     Made tremors worse.   . Nsaids Other (See Comments)    Irritates stomach  . Sulfonamide Derivatives Rash    HOME MEDICATIONS: Outpatient Medications Prior to Visit  Medication Sig Dispense Refill  . atorvastatin (LIPITOR) 40 MG tablet TAKE 1 TABLET BY MOUTH  DAILY 90 tablet 3  . carbidopa-levodopa (SINEMET IR) 25-100 MG tablet TAKE 1 TABLET BY MOUTH TWO  TIMES DAILY 180 tablet 0  . clonazePAM (KLONOPIN) 1 MG tablet Take 1 mg by mouth 3 (three) times daily.     . fludrocortisone (FLORINEF) 0.1 MG tablet TAKE ONE-HALF TABLET BY  MOUTH TWO TIMES DAILY 90 tablet 3  . glucose blood test strip 1 each by Other route. 7 times per day   - as instructed    . LATUDA 80 MG TABS tablet TAKE 1 TABLET BY MOUTH WITH MEAL  0  .  NOVOLOG 100 UNIT/ML injection Insulin pump    . pindolol (VISKEN) 5 MG tablet TAKE 1 TABLET BY MOUTH TWO  TIMES DAILY 180 tablet 3  . risperidone (RISPERDAL) 4 MG tablet Take 8 mg by mouth at bedtime.  0  . zolpidem (AMBIEN) 10 MG tablet Take 10 mg by mouth at bedtime.  1   No facility-administered medications prior to visit.     PAST MEDICAL HISTORY: Past Medical History:  Diagnosis Date  . 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 ([place])    opium  . 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 Allergy    spring  . Anxiety    ADHD CHRONIC ANXIETY  . Autonomic  neuropathy due to diabetes ([place])   . Bipolar disorder ([place]) 02/15/2011  . Cervical spondylosis without myelopathy   . Depression   . Depression   . Diabetes mellitus    sees Dr Altheimer/endo  . Dysautonomia ([place]) 10/24/2011  . Dyskinesia     subacute,due to drugs  . Dyslipidemia   . History of nuclear stress test    Myoview 11/16: EF 81%, no ischemia; Low Risk  . Hyperlipidemia   . IBS (irritable bowel syndrome) 02/15/2011  . Insomnia 02/21/2011  . Multinodular thyroid    with large dominant solid nodule with calcifications in the lower left pole benign by FNA in 1/12 and stable by repeat ulltrasound in 4/13  . Neuropathy    with paresthesias in feet  . Orthostatic hypotension   . Orthostatic hypotension   . 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 Parkinsonism ([place])   . POTS (postural orthostatic tachycardia syndrome)   . Radiculopathy    cervical  . Resting tremor   . Rotator cuff syndrome of left shoulder   . S/P partial hysterectomy   . Spondylosis without myelopathy   . Subacute dyskinesia due to drugs(333.85)    dr Jule Whitsel/neuro  . Tachycardia    syndrome  . Therapy    on therapy by psychiatrist using sebutex   . Thyroid nodule    s/p biopsy - benign approx jan 2012  . Tremor     PAST SURGICAL HISTORY: Past Surgical History:  Procedure Laterality Date  . ACROMIOPLASTY Left 2002   Dr Gladstone Lighter  . BREAST EXCISIONAL BIOPSY Right    benign  . BREAST LUMPECTOMY Right   . herniated disc cervical spine    . lower back  1992  . OOPHORECTOMY    . PARTIAL HYSTERECTOMY    . SPINE SURGERY  feb 2012   c5-6 fusion, Dr Rolena Infante  . THYROID LOBECTOMY  11/11   nodule biopsy, benign    FAMILY HISTORY: Family History  Problem Relation Age of Onset  . Lung cancer Mother   . Heart disease Father   . Hypertension Father   . Heart disease Paternal Grandmother   . ALS Paternal Grandmother   . Hypertension Sister     SOCIAL  HISTORY: Social History   Socioeconomic History  . Marital status: Single    Spouse name: Not on file  . Number of children: 0  . Years of education: BA  . Highest education level: Not on file  Social Needs  . Financial resource strain: Not on file  . Food insecurity - worry: Not on file  . Food insecurity - inability: Not on file  . Transportation needs - medical: Not on file  . Transportation needs - non-medical: Not on file  Occupational History  . Occupation: TEACHER  Employer: [employer]  Tobacco Use  . Smoking status: Never Smoker  . Smokeless tobacco: Never Used  Substance and Sexual Activity  . Alcohol use: No    Alcohol/week: 0.0 oz  . Drug use: No  . Sexual activity: No  Other Topics Concern  . Not on file  Social History Narrative   Patient is single and lives with my partner ([place]).   Patient is disabled.   Patient is right-handed.   Patient has a Haematologist.   Patient drinks one soda every other day.      PHYSICAL EXAM  Vitals:   01/06/17 1401  BP: 117/71  Pulse: 91  Weight: 152 lb 8 oz (69.2 kg)  Height: 5\' 7"  (1.702 m)   Body mass index is 23.88 kg/m.  Generalized: Well developed, in no acute distress , reports intact memory.   Neurological examination  Mentation: Alert oriented to time, place, history taking. Follows all commands. Masked face with little emotional mimick. . Cranial nerve II-XII: normal sense of smell and taste. Pupils were equal round reactive to light.  Extraocular movements were full, visual field were full on confrontational test. Facial sensation and strength were normal. Motor: symmetric motor tone is noted throughout.  Sensory: no by urning or numbness in feet.  Coordination:  finger-nose-finger intact bilaterally.  Gait and station: Patient has decreased arm swing. Slightly stooped posture. Turns with 3 steps. No tremor in either hand while walking.  Reflexes: Deep tendon reflexes are  symmetric and normal bilaterally.   DIAGNOSTIC DATA (LABS, IMAGING, TESTING) - I reviewed patient records, labs, notes, testing and imaging myself where available.  Lab Results  Component Value Date   WBC 4.8 08/21/2016   HGB 14.5 08/21/2016   HCT 42.0 08/21/2016   MCV 92.6 08/21/2016   PLT 281.0 08/21/2016      Component Value Date/Time   NA 146 (H) 04/02/2016 1659   K 4.2 04/02/2016 1659   CL 103 04/02/2016 1659   CO2 26 04/02/2016 1659   GLUCOSE 141 (H) 04/02/2016 1659   GLUCOSE 144 (H) 07/06/2015 1523   BUN 22 04/02/2016 1659   CREATININE 0.96 04/02/2016 1659   CREATININE 0.87 07/06/2015 1523   CALCIUM 9.6 04/02/2016 1659   PROT 6.4 04/02/2016 1659   ALBUMIN 4.1 04/02/2016 1659   AST 17 04/02/2016 1659   ALT 19 04/02/2016 1659   ALKPHOS 88 04/02/2016 1659   BILITOT 0.3 04/02/2016 1659   GFRNONAA 66 04/02/2016 1659   GFRAA 76 04/02/2016 1659   Lab Results  Component Value Date   CHOL 160 08/17/2014   HDL 54.80 08/17/2014   LDLCALC 91 08/17/2014   TRIG 69.0 08/17/2014   CHOLHDL 3 08/17/2014   Lab Results  Component Value Date   HGBA1C 9.0 (H) 08/17/2014   Lab Results  Component Value Date   VITAMINB12 944 (H) 08/17/2014   Lab Results  Component Value Date   TSH 1.57 08/21/2016      ASSESSMENT AND PLAN 56 y.o. 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 ([place]), Bipolar disorder ([place]) (02/15/2011), Cervical spondylosis without myelopathy, Depression, Depression, Diabetes mellitus, Dysautonomia ([place]) (10/24/2011), Dyskinesia, Dyslipidemia, History of nuclear stress test, Hyperlipidemia, IBS (irritable bowel syndrome) (02/15/2011), Insomnia (02/21/2011), Multinodular thyroid, Neuropathy, Orthostatic hypotension, Orthostatic hypotension, Parkinsonism ([place]), Parkinsonism ([place]), POTS (postural orthostatic tachycardia syndrome), Radiculopathy, Resting tremor, Rotator  cuff syndrome of left shoulder, S/P partial  hysterectomy, Spondylosis without myelopathy, Subacute dyskinesia due to drugs(333.85), Tachycardia, Therapy, Thyroid nodule, and Tremor. here with:  1. Parkinsonism, secondary. Responding to carbidopa levodopa. 2.  Postural orthostatic tachycardia syndrome, attributed to dysautonomia in relation to a diabetic NP.   The patient feels that Sinemet has been beneficial for her she will continue on Sinemet 25-100 mg three times a day- before a meal , 30 minutes.   . Advised that if her symptoms worsen or she develops new symptoms she should let us know. She will follow-up in 6 months with Np alternating with me,  Dr. Brett Fairy  I spent 30 minutes with the patient 50% of this time was spent discussing patient's medication.    [name], MD  01/06/2017, 2:18 PM [facility] [address] [place], [place] [address] [phone]

## 2017-01-06 NOTE — Patient Instructions (Signed)
Postural Orthostatic Tachycardia Syndrome Postural orthostatic tachycardia syndrome (POTS) is a group of symptoms that can occur when you stand up after lying down. POTS happens when less blood flows to the heart than normal after you stand up. The reduced blood flow to the heart makes the heart beat rapidly. POTS may be associated with another medical condition, or it may occur on its own. What are the causes? This cause of this condition is not known, but many conditions and diseases have been linked to it. What increases the risk? This condition is more likely to develop in:  Women 71-69 years old.  Women who are pregnant.  Women who are menstruating.  People who have certain conditions, including: ? A viral infection. ? An autoimmune disease. ? Anemia. ? Dehydration. ? Hyperthyroidism.  People who take certain medicines.  People who have had a major injury.  People who have had surgery.  What are the signs or symptoms? The most common symptom of this condition is lightheadedness after standing up from a lying down position. Other symptoms may include:  Feeling a rapid increase in the speed of the heartbeat (tachycardia) within 10 minutes of standing up.  Fainting.  Weakness.  Confusion.  Trembling.  Shortness of breath.  Sweating or flushing.  Headache.  Chest pain.  Breathing that is deeper and faster than normal (hyperventilation).  Nausea.  Anxiety.  Symptoms may be worse in the morning, and they may be relieved by lying down. How is this diagnosed? This condition is diagnosed based on:  Your symptoms.  Your medical history.  A physical exam.  Measurements of your heart rate when you are lying down and after you stand up.  A measurement of your blood pressure. The measurement will be taken when you go from lying down to standing up.  Blood tests to measure hormones that change with blood pressure. The blood tests will be done when you are  lying down and standing up.  You may have other tests to check whether you have a condition or disease that is linked to POTS. How is this treated? Treatment for this condition depends on how severe your symptoms are and whether you have any conditions or diseases that have been linked to POTS. Treatment may involve:  Treating any conditions or diseases that have been linked to POTS.  Drinking two glasses of water before getting up from a lying position.  Eating more salt (sodium).  Taking medicine to control blood pressure and heart rate (beta-blocker).  Taking medicines to control blood flow, blood pressure, or heart rate.  Avoiding certain medicines.  Starting an exercise program under the supervision of your health care provider.  Follow these instructions at home:  Eating and drinking  Drink enough fluid to keep your urine clear or pale yellow.  If told by your health care provider, drink two glasses of water before getting up from a lying position.  Follow instructions from your health care provider about how much sodium you should eat.  Eat several small meals a day instead of a few large meals.  Avoid heavy meals. Medicines  Take over-the-counter and prescription medicines only as told by your health care provider.  Talk with your health care provider before starting any new medicines. Activity  Do an aerobic exercise for 20 minutes a day, at least 3 days a week.  Ask your health care provider what kinds of exercise are safe for you. Contact a health care provider if:  Your symptoms  do not improve after treatment.  Your symptoms get worse.  You develop new symptoms. Get help right away if:  You have chest pain.  You have difficulty breathing.  You have fainting episodes. This information is not intended to replace advice given to you by your health care provider. Make sure you discuss any questions you have with your health care provider. Document  Released: 01/17/2002 Document Revised: 03/07/2015 Document Reviewed: 08/11/2014 Elsevier Interactive Patient Education  2018 [employer].  

## 2017-02-25 ENCOUNTER — Telehealth: Payer: Self-pay | Admitting: Internal Medicine

## 2017-02-25 NOTE — Telephone Encounter (Signed)
Caller Mikki Santee diabetic counselor with [facility]) wanting latest HgbA1C.   11/08 HgbA1C per office note 11%.

## 2017-07-07 ENCOUNTER — Encounter: Payer: Self-pay | Admitting: Adult Health

## 2017-07-07 ENCOUNTER — Ambulatory Visit: Payer: Medicare Other | Admitting: Adult Health

## 2017-07-07 VITALS — BP 143/84 | HR 84 | Ht 67.0 in | Wt 163.4 lb

## 2017-07-07 DIAGNOSIS — G2111 Neuroleptic induced parkinsonism: Secondary | ICD-10-CM | POA: Diagnosis not present

## 2017-07-07 MED ORDER — CARBIDOPA-LEVODOPA 25-100 MG PO TABS: ORAL_TABLET | ORAL | 3 refills | Status: DC

## 2017-07-07 NOTE — Progress Notes (Signed)
PATIENT: Lynn Barnes DOB: 04/19/1960  REASON FOR VISIT: follow up HISTORY FROM: patient  HISTORY OF PRESENT ILLNESS: Today 07/07/17 Lynn Barnes is a 57 year old female with a history of parkinsonism.  She returns today for follow-up.  She reports that since she increase Sinemet to 3 times a day her tremor has improved.  She denies any significant changes with her gait or balance.  She denies any involuntary movements.  Denies any trouble eating or swallowing.  She returns today for evaluation.  HISTORY( Copied from Dr. Edwena Felty note)  have the pleasure of meeting today again with Lynn Barnes, meanwhile 57 year old patient, I had placed on carbidopa levodopa after she presented in February with mouth, jaw and tongue trembling and tremors.  She feels that this has been beneficial .  She is calmer without any obvious tremor today she still has vocal cord Trembling.  She also reports a greater fluidity of movement.  She had no further POTs related problems, I had always suspected that her dysautonomia was related to her juvenile diabetes long-standing insulin dependence.  However, I would not expect dysautonomia to get better on Sinemet. Shy- Draeger ?     REVIEW OF SYSTEMS: Out of a complete 14 system review of symptoms, the patient complains only of the following symptoms, and all other reviewed systems are negative.  See HPI ALLERGIES: Allergies  Allergen Reactions  . Artane [Trihexyphenidyl]     Made tremors worse.   . Nsaids Other (See Comments)    Irritates stomach  . Sulfonamide Derivatives Rash    HOME MEDICATIONS: Outpatient Medications Prior to Visit  Medication Sig Dispense Refill  . atorvastatin (LIPITOR) 40 MG tablet TAKE 1 TABLET BY MOUTH  DAILY 90 tablet 3  . carbidopa-levodopa (SINEMET IR) 25-100 MG tablet Three times daily PO before meals. 270 tablet 1  . clonazePAM (KLONOPIN) 1 MG tablet Take 1 mg by mouth 3 (three) times daily.     . fludrocortisone  (FLORINEF) 0.1 MG tablet TAKE ONE-HALF TABLET BY  MOUTH TWO TIMES DAILY 90 tablet 3  . glucose blood test strip 1 each by Other route. 7 times per day   - as instructed    . LATUDA 80 MG TABS tablet TAKE 1 TABLET BY MOUTH WITH MEAL  0  . NOVOLOG 100 UNIT/ML injection Insulin pump    . pindolol (VISKEN) 5 MG tablet TAKE 1 TABLET BY MOUTH TWO  TIMES DAILY 180 tablet 3  . risperidone (RISPERDAL) 4 MG tablet Take 8 mg by mouth at bedtime.  0  . zolpidem (AMBIEN) 10 MG tablet Take 10 mg by mouth at bedtime.  1   No facility-administered medications prior to visit.     PAST MEDICAL HISTORY: Past Medical History:  Diagnosis Date  . 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 ([place])    opium  . Addiction, opium ([place])   . Allergy    spring  . Anxiety    ADHD CHRONIC ANXIETY  . 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 Bipolar disorder ([place]) 02/15/2011  . Cervical spondylosis without myelopathy   . Depression   . Depression   . Diabetes mellitus    sees Dr Altheimer/endo  . Dysautonomia ([place]) 10/24/2011  . Dyskinesia     subacute,due to drugs  . Dyslipidemia   . History of nuclear stress test    Myoview 11/16: EF 81%, no ischemia; Low Risk  . Hyperlipidemia   . IBS (irritable bowel syndrome) 02/15/2011  . Insomnia 02/21/2011  .  Multinodular thyroid    with large dominant solid nodule with calcifications in the lower left pole benign by FNA in 1/12 and stable by repeat ulltrasound in 4/13  . Neuropathy    with paresthesias in feet  . Orthostatic hypotension   . Orthostatic hypotension   . Parkinsonism ([place])    due to prolonged prozac/risperdl for yrs/Dr Dohmeir  . Parkinsonism ([place])   . POTS (postural orthostatic tachycardia syndrome)   . Radiculopathy    cervical  . Resting tremor   . Rotator cuff syndrome of left shoulder   . S/P partial hysterectomy   . Spondylosis without myelopathy   . Subacute dyskinesia due to drugs(333.85)    dr dohmeier/neuro  . Tachycardia    syndrome  . Therapy    on  therapy by psychiatrist using sebutex   . Thyroid nodule    s/p biopsy - benign approx jan 2012  . Tremor     PAST SURGICAL HISTORY: Past Surgical History:  Procedure Laterality Date  . ACROMIOPLASTY Left 2002   Dr Gladstone Lighter  . BREAST EXCISIONAL BIOPSY Right    benign  . BREAST LUMPECTOMY Right   . herniated disc cervical spine    . lower back  1992  . OOPHORECTOMY    . PARTIAL HYSTERECTOMY    . SPINE SURGERY  feb 2012   c5-6 fusion, Dr Rolena Infante  . THYROID LOBECTOMY  11/11   nodule biopsy, benign    FAMILY HISTORY: Family History  Problem Relation Age of Onset  . Lung cancer Mother   . Heart disease Father   . Hypertension Father   . Heart disease Paternal Grandmother   . ALS Paternal Grandmother   . Hypertension Sister     SOCIAL HISTORY: Social History   Socioeconomic History  . Marital status: Single    Spouse name: Not on file  . Number of children: 0  . Years of education: BA  . Highest education level: Not on file  Occupational History  . Occupation: Product manager: [employer]  Social Needs  . Financial resource strain: Not on file  . Food insecurity:    Worry: Not on file    Inability: Not on file  . Transportation needs:    Medical: Not on file    Non-medical: Not on file  Tobacco Use  . Smoking status: Never Smoker  . Smokeless tobacco: Never Used  Substance and Sexual Activity  . Alcohol use: No    Alcohol/week: 0.0 oz  . Drug use: No  . Sexual activity: Never  Lifestyle  . Physical activity:    Days per week: Not on file    Minutes per session: Not on file  . Stress: Not on file  Relationships  . Social connections:    Talks on phone: Not on file    Gets together: Not on file    Attends religious service: Not on file    Active member of club or organization: Not on file    Attends meetings of clubs or organizations: Not on file    Relationship status: Not on file  . Intimate partner violence:    Fear of current or  ex partner: Not on file    Emotionally abused: Not on file    Physically abused: Not on file    Forced sexual activity: Not on file  Other Topics Concern  . Not on file  Social History Narrative   Patient is single and lives with my  partner [facility]).   Patient is disabled.   Patient is right-handed.   Patient has a Haematologist.   Patient drinks one soda every other day.      PHYSICAL EXAM  Vitals:   07/07/17 1331  BP: (!) 143/84  Pulse: 84  Weight: 163 lb 6.4 oz (74.1 kg)  Height: 5\' 7"  (1.702 m)   Body mass index is 25.59 kg/m.  Generalized: Well developed, in no acute distress   Neurological examination  Mentation: Alert oriented to time, place, history taking. Follows all commands speech and language fluent. Mild vocal tremor Cranial nerve II-XII: Pupils were equal round reactive to light. Extraocular movements were full, visual field were full on confrontational test. Facial sensation and strength were normal. Uvula tongue midline. Head turning and shoulder shrug  were normal and symmetric.  Motor: The motor testing reveals 5 over 5 strength of all 4 extremities. Good symmetric motor tone is noted throughout.  Sensory: Sensory testing is intact to soft touch on all 4 extremities. No evidence of extinction is noted.  Coordination: Cerebellar testing reveals good finger-nose-finger and heel-to-shin bilaterally. Mild tremor in upper extremities. Gait and station: Gait is normal. Reflexes: Deep tendon reflexes are symmetric and normal bilaterally.   DIAGNOSTIC DATA (LABS, IMAGING, TESTING) - I reviewed patient records, labs, notes, testing and imaging myself where available.  Lab Results  Component Value Date   WBC 4.8 08/21/2016   HGB 14.5 08/21/2016   HCT 42.0 08/21/2016   MCV 92.6 08/21/2016   PLT 281.0 08/21/2016      Component Value Date/Time   NA 146 (H) 04/02/2016 1659   K 4.2 04/02/2016 1659   CL 103 04/02/2016 1659   CO2 26 04/02/2016 1659     GLUCOSE 141 (H) 04/02/2016 1659   GLUCOSE 144 (H) 07/06/2015 1523   BUN 22 04/02/2016 1659   CREATININE 0.96 04/02/2016 1659   CREATININE 0.87 07/06/2015 1523   CALCIUM 9.6 04/02/2016 1659   PROT 6.4 04/02/2016 1659   ALBUMIN 4.1 04/02/2016 1659   AST 17 04/02/2016 1659   ALT 19 04/02/2016 1659   ALKPHOS 88 04/02/2016 1659   BILITOT 0.3 04/02/2016 1659   GFRNONAA 66 04/02/2016 1659   GFRAA 76 04/02/2016 1659   Lab Results  Component Value Date   CHOL 160 08/17/2014   HDL 54.80 08/17/2014   LDLCALC 91 08/17/2014   TRIG 69.0 08/17/2014   CHOLHDL 3 08/17/2014   Lab Results  Component Value Date   HGBA1C 9.0 (H) 08/17/2014   Lab Results  Component Value Date   VITAMINB12 944 (H) 08/17/2014   Lab Results  Component Value Date   TSH 1.57 08/21/2016      ASSESSMENT AND PLAN 57 y.o. 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[place]), Bipolar disorder ([place]) (02/15/2011), Cervical spondylosis without myelopathy, Depression, Depression, Diabetes mellitus, Dysautonomia ([place]) (10/24/2011), Dyskinesia, Dyslipidemia, History of nuclear stress test, Hyperlipidemia, IBS (irritable bowel syndrome) (02/15/2011), Insomnia (02/21/2011), Multinodular thyroid, Neuropathy, Orthostatic hypotension, Orthostatic hypotension, Parkinsonism ([place]), Parkinsonism ([place]), POTS (postural orthostatic tachycardia syndrome), Radiculopathy, Resting tremor, Rotator cuff syndrome of left shoulder, S/P partial hysterectomy, Spondylosis without myelopathy, Subacute dyskinesia due to drugs(333.85), Tachycardia, Therapy, Thyroid nodule, and Tremor. here with :  1.  Parkinsonism  Overall patient is doing well.  She will continue on Sinemet 25-100 mg 3 times a day.  She is advised that if her symptoms worsen  or she develops new symptoms she should let us know.  She will follow-up in 6 months or sooner if needed.  I  spent 15 minutes with the patient. 50% of this time was spent discussing her medication and symptom   Ward Givens, MSN, NP-C 07/07/2017, 1:30 PM [facility] [address] [place], [place] [address] [phone]

## 2017-07-07 NOTE — Patient Instructions (Signed)
Your Plan:  Continue Sinemet 25-100 three times a day If your symptoms worsen or you develop new symptoms please let us know.   Thank you for coming to see [place] at [facility]. I hope we have been able to provide you high quality care today.  You may receive a patient satisfaction survey over the next few weeks. We would appreciate your feedback and comments so that we may continue to improve ourselves and the health of our patients.

## 2017-07-24 ENCOUNTER — Other Ambulatory Visit: Payer: Self-pay | Admitting: Internal Medicine

## 2017-08-24 ENCOUNTER — Encounter: Payer: Self-pay | Admitting: Internal Medicine

## 2017-08-24 ENCOUNTER — Ambulatory Visit (INDEPENDENT_AMBULATORY_CARE_PROVIDER_SITE_OTHER): Payer: Medicare Other | Admitting: Internal Medicine

## 2017-08-24 VITALS — BP 106/68 | HR 90 | Temp 98.2°F | Ht 67.0 in | Wt 160.0 lb

## 2017-08-24 DIAGNOSIS — Z0000 Encounter for general adult medical examination without abnormal findings: Secondary | ICD-10-CM | POA: Diagnosis not present

## 2017-08-24 DIAGNOSIS — Z23 Encounter for immunization: Secondary | ICD-10-CM | POA: Diagnosis not present

## 2017-08-24 DIAGNOSIS — E1042 Type 1 diabetes mellitus with diabetic polyneuropathy: Secondary | ICD-10-CM | POA: Diagnosis not present

## 2017-08-24 NOTE — Patient Instructions (Addendum)
Please check with your insurance about coverage for the Cologuard  You had the pneumovax pneumonia shot today  Please continue all other medications as before, and refills have been done if requested.  Please have the pharmacy call with any other refills you may need.  Please continue your efforts at being more active, low cholesterol diet, and weight control.  You are otherwise up to date with prevention measures today.  Please keep your appointments with your specialists as you may have planned  Please return in 1 year for your yearly visit, or sooner if needed, with Lab testing done 3-5 days before

## 2017-08-24 NOTE — Assessment & Plan Note (Signed)

## 2017-08-24 NOTE — Progress Notes (Signed)
Subjective:    Patient ID: Lynn Barnes, female    DOB: 05-Jul-1960, 57 y.o.   MRN: [id number]  HPI    Here for wellness and f/u;  Overall doing ok;  Pt denies Chest pain, worsening SOB, DOE, wheezing, orthopnea, PND, worsening LE edema, palpitations, but still having some dizzyiness and ? Syncope briefly with standing  Has f/u appt Dr Harrington Challenger this month. Can not walk more than 200 yds when gets so exhausted  Pt denies neurological change such as new headache, facial or extremity weakness.  Pt denies polydipsia, polyuria, or low sugar symptoms. Pt states overall good compliance with treatment and medications, good tolerability, and has been trying to follow appropriate diet.  Pt denies worsening depressive symptoms but just not better on the latruda., no suicidal ideation or panic. No fever, night sweats, wt loss, loss of appetite, or other constitutional symptoms.  Pt states good ability with ADL's, has low fall risk, home safety reviewed and adequate, no other significant changes in hearing or vision, and only occasionally active with exercise.  Has psychiatry appt for aug 5.  Still seeing endo Dr Altheimer with labs "ok" last month.  A1c was about 7.8. Plans to see eye doctor soon.   Had full lab panel jun3 9 - see EMR record.   Past Medical History:  Diagnosis Date  . 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 ([place])    opium  . Addiction, opium ([place])   . Allergy    spring  . Anxiety    ADHD CHRONIC ANXIETY  . 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 Bipolar disorder ([place]) 02/15/2011  . Cervical spondylosis without myelopathy   . Depression   . Depression   . Diabetes mellitus    sees Dr Altheimer/endo  . Dysautonomia ([employer]) 10/24/2011  . Dyskinesia     subacute,due to drugs  . Dyslipidemia   . History of nuclear stress test    Myoview 11/16: EF 81%, no ischemia; Low Risk  . Hyperlipidemia   . IBS (irritable bowel syndrome) 02/15/2011  . Insomnia 02/21/2011  . Multinodular thyroid    with large dominant  solid nodule with calcifications in the lower left pole benign by FNA in 1/12 and stable by repeat ulltrasound in 4/13  . Neuropathy    with paresthesias in feet  . Orthostatic hypotension   . Orthostatic hypotension   . Parkinsonism ([place])    due to prolonged prozac/risperdl for yrs/Dr Dohmeir  . Parkinsonism ([place])   . POTS (postural orthostatic tachycardia syndrome)   . Radiculopathy    cervical  . Resting tremor   . Rotator cuff syndrome of left shoulder   . S/P partial hysterectomy   . Spondylosis without myelopathy   . Subacute dyskinesia due to drugs(333.85)    dr dohmeier/neuro  . Tachycardia    syndrome  . Therapy    on therapy by psychiatrist using sebutex   . Thyroid nodule    s/p biopsy - benign approx jan 2012  . Tremor    Past Surgical History:  Procedure Laterality Date  . ACROMIOPLASTY Left 2002   Dr Gladstone Lighter  . BREAST EXCISIONAL BIOPSY Right    benign  . BREAST LUMPECTOMY Right   . herniated disc cervical spine    . lower back  1992  . OOPHORECTOMY    . PARTIAL HYSTERECTOMY    . SPINE SURGERY  feb 2012   c5-6 fusion, Dr Rolena Infante  . THYROID LOBECTOMY  11/11   nodule biopsy,  benign    reports that she has never smoked. She has never used smokeless tobacco. She reports that she does not drink alcohol or use drugs. family history includes ALS in her paternal grandmother; Heart disease in her father and paternal grandmother; Hypertension in her father and sister; Lung cancer in her mother. Allergies  Allergen Reactions  . Artane [Trihexyphenidyl]     Made tremors worse.   . Nsaids Other (See Comments)    Irritates stomach  . Sulfonamide Derivatives Rash   Current Outpatient Medications on File Prior to Visit  Medication Sig Dispense Refill  . atorvastatin (LIPITOR) 40 MG tablet TAKE 1 TABLET BY MOUTH  DAILY 90 tablet 3  . carbidopa-levodopa (SINEMET IR) 25-100 MG tablet Three times daily PO before meals. 270 tablet 3  . diazepam (VALIUM) 5 MG tablet Take  5 mg by mouth 2 (two) times daily.    . fludrocortisone (FLORINEF) 0.1 MG tablet TAKE ONE-HALF TABLET BY  MOUTH TWICE A DAY 90 tablet 0  . glucose blood test strip 1 each by Other route. 7 times per day   - as instructed    . LATUDA 80 MG TABS tablet TAKE 1 TABLET BY MOUTH WITH MEAL  0  . NOVOLOG 100 UNIT/ML injection Insulin pump    . pindolol (VISKEN) 5 MG tablet TAKE 1 TABLET BY MOUTH TWO  TIMES DAILY 180 tablet 3  . risperidone (RISPERDAL) 4 MG tablet Take 8 mg by mouth at bedtime.  0  . zolpidem (AMBIEN) 10 MG tablet Take 10 mg by mouth at bedtime.  1   No current facility-administered medications on file prior to visit.    Review of Systems Constitutional: Negative for other unusual diaphoresis, sweats, appetite or weight changes HENT: Negative for other worsening hearing loss, ear pain, facial swelling, mouth sores or neck stiffness.   Eyes: Negative for other worsening pain, redness or other visual disturbance.  Respiratory: Negative for other stridor or swelling Cardiovascular: Negative for other palpitations or other chest pain  Gastrointestinal: Negative for worsening diarrhea or loose stools, blood in stool, distention or other pain Genitourinary: Negative for hematuria, flank pain or other change in urine volume.  Musculoskeletal: Negative for myalgias or other joint swelling.  Skin: Negative for other color change, or other wound or worsening drainage.  Neurological: Negative for other syncope or numbness. Hematological: Negative for other adenopathy or swelling Psychiatric/Behavioral: Negative for hallucinations, other worsening agitation, SI, self-injury, or new decreased concentration All other system neg per pt    Objective:   Physical Exam BP 106/68   Pulse 90   Temp 98.2 F (36.8 C) (Oral)   Ht 5\' 7"  (1.702 m)   Wt 160 lb (72.6 kg)   SpO2 96%   BMI 25.06 kg/m  VS noted,  Constitutional: Pt is oriented to person, place, and time. Appears well-developed and  well-nourished, in no significant distress and comfortable Head: Normocephalic and atraumatic  Eyes: Conjunctivae and EOM are normal. Pupils are equal, round, and reactive to light Right Ear: External ear normal without discharge Left Ear: External ear normal without discharge Nose: Nose without discharge or deformity Mouth/Throat: Oropharynx is without other ulcerations and moist  Neck: Normal range of motion. Neck supple. No JVD present. No tracheal deviation present or significant neck LA or mass Cardiovascular: Normal rate, regular rhythm, normal heart sounds and intact distal pulses.   Pulmonary/Chest: WOB normal and breath sounds without rales or wheezing  Abdominal: Soft. Bowel sounds are normal. NT. No  HSM  Musculoskeletal: Normal range of motion. Exhibits no edema Lymphadenopathy: Has no other cervical adenopathy.  Neurological: Pt is alert and oriented to person, place, and time. Pt has normal reflexes. No cranial nerve deficit. Motor grossly intact, Gait intact Skin: Skin is warm and dry. No rash noted or new ulcerations Psychiatric:  Has normal mood and affect. Behavior is normal without agitation No other exam findings Lab Results  Component Value Date   WBC 4.8 08/21/2016   HGB 14.5 08/21/2016   HCT 42.0 08/21/2016   PLT 281.0 08/21/2016   GLUCOSE 141 (H) 04/02/2016   CHOL 160 08/17/2014   TRIG 69.0 08/17/2014   HDL 54.80 08/17/2014   LDLCALC 91 08/17/2014   ALT 19 04/02/2016   AST 17 04/02/2016   NA 146 (H) 04/02/2016   K 4.2 04/02/2016   CL 103 04/02/2016   CREATININE 0.96 04/02/2016   BUN 22 04/02/2016   CO2 26 04/02/2016   TSH 1.57 08/21/2016   HGBA1C 9.0 (H) 08/17/2014      Assessment & Plan:

## 2017-08-24 NOTE — Assessment & Plan Note (Signed)
To f/u endo 

## 2017-08-25 ENCOUNTER — Encounter: Payer: Medicare Other | Admitting: Internal Medicine

## 2017-08-31 ENCOUNTER — Encounter: Payer: Self-pay | Admitting: *Deleted

## 2017-09-03 ENCOUNTER — Telehealth: Payer: Self-pay | Admitting: Internal Medicine

## 2017-09-03 NOTE — Telephone Encounter (Signed)
Form has been signed, copy sent to scan.   Pt informed and ready to be picked up.

## 2017-09-03 NOTE — Telephone Encounter (Signed)
Renewal Parking placard forms has been completed & placed in providers box to sign.

## 2017-09-13 NOTE — Progress Notes (Deleted)
Cardiology Office Note   Date:  09/13/2017   ID:  Lynn Barnes, DOB [birth year], MRN [id number]  PCP:  Biagio Borg, MD  Cardiologist:   Dorris Carnes, MD    Pt returns for f/u of autonomic dysfunction.   History of Present Illness: Lynn Barnes is a 57 y.o. female with a history of parkinsons and autonomic dysfunciton   I saw her in May 2017  Symptoms of dizziness worse at that time I saw her in cinic back in Summer 2018   She was doing OK from BP standpoint at that time    No outpatient medications have been marked as taking for the 09/14/17 encounter (Appointment) with Fay Records, MD.     Allergies:   Artane [trihexyphenidyl]; Nsaids; and Sulfonamide derivatives   Past Medical History:  Diagnosis Date  . 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 ([place])    opium  . Addiction, opium ([place])   . Allergy    spring  . Anxiety    ADHD CHRONIC ANXIETY  . 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 Bipolar disorder ([place]) 02/15/2011  . Cervical spondylosis without myelopathy   . Depression   . Depression   . Diabetes mellitus    sees Dr Altheimer/endo  . Dysautonomia ([place]) 10/24/2011  . Dyskinesia     subacute,due to drugs  . Dyslipidemia   . History of nuclear stress test    Myoview 11/16: EF 81%, no ischemia; Low Risk  . Hyperlipidemia   . IBS (irritable bowel syndrome) 02/15/2011  . Insomnia 02/21/2011  . Multinodular thyroid    with large dominant solid nodule with calcifications in the lower left pole benign by FNA in 1/12 and stable by repeat ulltrasound in 4/13  . Neuropathy    with paresthesias in feet  . Orthostatic hypotension   . Orthostatic hypotension   . Parkinsonism ([place])    due to prolonged prozac/risperdl for yrs/Dr Dohmeir  . Parkinsonism ([place])   . POTS (postural orthostatic tachycardia syndrome)   . Radiculopathy    cervical  . Resting tremor   . Rotator cuff syndrome of left shoulder   . S/P partial hysterectomy   . Spondylosis without myelopathy   . Subacute  dyskinesia due to drugs(333.85)    dr dohmeier/neuro  . Tachycardia    syndrome  . Therapy    on therapy by psychiatrist using sebutex   . Thyroid nodule    s/p biopsy - benign approx jan 2012  . Tremor     Past Surgical History:  Procedure Laterality Date  . ACROMIOPLASTY Left 2002   Dr Gladstone Lighter  . BREAST EXCISIONAL BIOPSY Right    benign  . BREAST LUMPECTOMY Right   . herniated disc cervical spine    . lower back  1992  . OOPHORECTOMY    . PARTIAL HYSTERECTOMY    . SPINE SURGERY  feb 2012   c5-6 fusion, Dr Rolena Infante  . THYROID LOBECTOMY  11/11   nodule biopsy, benign     Social History:  The patient  reports that she has never smoked. She has never used smokeless tobacco. She reports that she does not drink alcohol or use drugs.   Family History:  The patient's family history includes ALS in her paternal grandmother; Heart disease in her father and paternal grandmother; Hypertension in her father and sister; Lung cancer in her mother; Pneumonia in her father.    ROS:  Please see the history of present illness. All other  systems are reviewed and  Negative to the above problem except as noted.    PHYSICAL EXAM: VS:  There were no vitals taken for this visit.  GEN: Well nourished, well developed, in no acute distress  HEENT: normal  Neck: no JVD, carotid bruits, or masses Cardiac: RRR; no murmurs, rubs, or gallops,no edema  Respiratory:  clear to auscultation bilaterally, normal work of breathing GI: soft, nontender, nondistended, + BS  No hepatomegaly  MS: no deformity Moving all extremities   Skin: warm and dry, no rash Neuro:  Strength and sensation are intact Psych: euthymic mood, full affect   EKG:  EKG is ordered today.  SR 79 bpm     Lipid Panel    Component Value Date/Time   CHOL 160 08/17/2014 1522   TRIG 69.0 08/17/2014 1522   HDL 54.80 08/17/2014 1522   CHOLHDL 3 08/17/2014 1522   VLDL 13.8 08/17/2014 1522   LDLCALC 91 08/17/2014 1522      Wt  Readings from Last 3 Encounters:  08/24/17 160 lb (72.6 kg)  07/07/17 163 lb 6.4 oz (74.1 kg)  01/06/17 152 lb 8 oz (69.2 kg)      ASSESSMENT AND PLAN:  1  Autonomic dysfunction  Pt's symptoms appear to be relatively controlloed  I would keep on Pindolol and florinef  Potassium in Feb 4.2  2  Dyslipidemia  On lipitor  Will have her come back to get fasting lipids       Current medicines are reviewed at length with the patient today.  The patient does not have concerns regarding medicines.  Signed, Dorris Carnes, MD  09/13/2017 10:34 AM    [address] [place] [place], [place], [place]  [address] Phone: [phone]; Fax: [phone]

## 2017-09-14 ENCOUNTER — Ambulatory Visit: Payer: Medicare Other | Admitting: Internal Medicine

## 2017-09-15 LAB — HM MAMMOGRAPHY

## 2017-10-01 ENCOUNTER — Encounter: Payer: Self-pay | Admitting: Internal Medicine

## 2017-10-13 ENCOUNTER — Other Ambulatory Visit: Payer: Self-pay | Admitting: Internal Medicine

## 2017-10-15 NOTE — Telephone Encounter (Signed)
Pt needs OV for further refills. Last OV was 07/27/2016.

## 2017-10-20 ENCOUNTER — Other Ambulatory Visit: Payer: Self-pay | Admitting: Internal Medicine

## 2017-10-26 ENCOUNTER — Ambulatory Visit: Payer: Medicare Other | Admitting: Internal Medicine

## 2017-10-26 ENCOUNTER — Encounter (INDEPENDENT_AMBULATORY_CARE_PROVIDER_SITE_OTHER): Payer: Self-pay

## 2017-10-26 ENCOUNTER — Encounter: Payer: Self-pay | Admitting: Internal Medicine

## 2017-10-26 VITALS — BP 128/80 | HR 85 | Ht 67.0 in | Wt 166.4 lb

## 2017-10-26 DIAGNOSIS — E782 Mixed hyperlipidemia: Secondary | ICD-10-CM

## 2017-10-26 DIAGNOSIS — R000 Tachycardia, unspecified: Secondary | ICD-10-CM

## 2017-10-26 DIAGNOSIS — I951 Orthostatic hypotension: Secondary | ICD-10-CM | POA: Diagnosis not present

## 2017-10-26 DIAGNOSIS — G90A Postural orthostatic tachycardia syndrome (POTS): Secondary | ICD-10-CM

## 2017-10-26 NOTE — Patient Instructions (Signed)
Your physician recommends that you continue on your current medications as directed. Please refer to the Current Medication list given to you today.  Your physician recommends that you return for lab work in: bmet, lipids  Your physician wants you to follow-up in: 1 year with Dr. Harrington Challenger.  You will receive a reminder letter in the mail two months in advance. If you don't receive a letter, please call our office to schedule the follow-up appointment.

## 2017-10-26 NOTE — Progress Notes (Addendum)
Cardiology Office Note   Date:  10/26/2017   ID:  Lynn Barnes, DOB 30-Oct-1960, MRN [id number]  PCP:  Biagio Borg, MD  Cardiologist:   Dorris Carnes, MD    Pt returns for f/u of autonomic dysfunction.   History of Present Illness: Lynn Barnes is a 57 y.o. female with a history of parkinsons and autonomic dysfunciton   I saw her in June 201  Has occasional dizzinesss   NOw has two syncopal spells   Both in [place]  Not a lot of warning   Occurred after am meds but before she had anything to eat or drink  SOB with activity is unchanged  Uses stationary bike     Current Meds  Medication Sig  . atorvastatin (LIPITOR) 40 MG tablet TAKE 1 TABLET BY MOUTH  DAILY  . carbidopa-levodopa (SINEMET IR) 25-100 MG tablet Three times daily PO before meals.  . diazepam (VALIUM) 5 MG tablet Take 5 mg by mouth 2 (two) times daily.  . fludrocortisone (FLORINEF) 0.1 MG tablet TAKE ONE-HALF TABLET BY  MOUTH TWICE A DAY  . glucose blood test strip 1 each by Other route. 7 times per day   - as instructed  . LATUDA 80 MG TABS tablet TAKE 1 TABLET BY MOUTH WITH MEAL  . NOVOLOG 100 UNIT/ML injection Insulin pump  . pindolol (VISKEN) 5 MG tablet TAKE 1 TABLET BY MOUTH TWO  TIMES DAILY  . risperidone (RISPERDAL) 4 MG tablet Take 8 mg by mouth at bedtime.  [name] zolpidem (AMBIEN) 10 MG tablet Take 10 mg by mouth at bedtime.     Allergies:   Artane [trihexyphenidyl]; Nsaids; and Sulfonamide derivatives   Past Medical History:  Diagnosis Date  . 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 ([place])    opium  . Addiction, opium ([place])   . Allergy    spring  . Anxiety    ADHD CHRONIC ANXIETY  . 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 Bipolar disorder ([place]) 02/15/2011  . Cervical spondylosis without myelopathy   . Depression   . Depression   . Diabetes mellitus    sees Dr Altheimer/endo  . Dysautonomia ([place]) 10/24/2011  . Dyskinesia     subacute,due to drugs  . Dyslipidemia   . History of nuclear stress test    Myoview 11/16: EF 81%, no ischemia; Low Risk  . Hyperlipidemia   . IBS (irritable bowel syndrome) 02/15/2011  . Insomnia 02/21/2011  . Multinodular thyroid    with large dominant solid nodule with calcifications in the lower left pole benign by FNA in 1/12 and stable by repeat ulltrasound in 4/13  . Neuropathy    with paresthesias in feet  . Orthostatic hypotension   . Orthostatic hypotension   . 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 Parkinsonism ([place])   . POTS (postural orthostatic tachycardia syndrome)   . Radiculopathy    cervical  . Resting tremor   . Rotator cuff syndrome of left shoulder   . S/P partial hysterectomy   . Spondylosis without myelopathy   . Subacute dyskinesia due to drugs(333.85)    dr dohmeier/neuro  . Tachycardia    syndrome  . Therapy    on therapy by psychiatrist using sebutex   . Thyroid nodule    s/p biopsy - benign approx jan 2012  . Tremor     Past Surgical History:  Procedure Laterality Date  . ACROMIOPLASTY Left 2002   Dr  Gioffre  . BREAST EXCISIONAL BIOPSY Right    benign  . BREAST LUMPECTOMY Right   . herniated disc cervical spine    . lower back  1992  . OOPHORECTOMY    . PARTIAL HYSTERECTOMY    . SPINE SURGERY  feb 2012   c5-6 fusion, Dr Rolena Infante  . THYROID LOBECTOMY  11/11   nodule biopsy, benign     Social History:  The patient  reports that she has never smoked. She has never used smokeless tobacco. She reports that she does not drink alcohol or use drugs.   Family History:  The patient's family history includes ALS in her paternal grandmother; Heart disease in her father and paternal grandmother; Hypertension in her father and sister; Lung cancer in her mother; Pneumonia in her father.    ROS:  Please see the history of present illness. All other systems are reviewed and  Negative to the above problem except as noted.    PHYSICAL EXAM: VS:  BP 128/80   Pulse 85   Ht 5\' 7"  (1.702 m)    Wt 166 lb 6.4 oz (75.5 kg)   BMI 26.06 kg/m   GEN: Well nourished, well developed, in no acute distress  HEENT: normal  Neck: JVP is normal  No  carotid bruits, or masses Cardiac: RRR; no murmurs, rubs, or gallops,no edema  Respiratory:  clear to auscultation bilaterally, normal work of breathing GI: soft, nontender, nondistended, + BS  No hepatomegaly  MS: no deformity Moving all extremities   Skin: warm and dry, no rash Neuro:  Strength and sensation are intact Psych: euthymic mood, full affect   EKG:  EKG is ordered today.  SR  85 bpm     Lipid Panel    Component Value Date/Time   CHOL 160 08/17/2014 1522   TRIG 69.0 08/17/2014 1522   HDL 54.80 08/17/2014 1522   CHOLHDL 3 08/17/2014 1522   VLDL 13.8 08/17/2014 1522   LDLCALC 91 08/17/2014 1522      Wt Readings from Last 3 Encounters:  10/26/17 166 lb 6.4 oz (75.5 kg)  08/24/17 160 lb (72.6 kg)  07/07/17 163 lb 6.4 oz (74.1 kg)      ASSESSMENT AND PLAN:  1  Autonomic dysfunction  Still symtpomatic at times with a couple episodes of syncope  I encouraged her to drink fluids before she gets up to start making  breakfast     I encouraged her to stay active  Check BMET since on florinef   2  Dyslipidemia  On lipitor   Get lipid panel   3  Parkinson's   Continue on Sinemet    Current medicines are reviewed at length with the patient today.  The patient does not have concerns regarding medicines.  Signed, Dorris Carnes, MD  10/26/2017 1:35 PM    [facility] [address], [place], [place]  [address] Phone: [phone]; Fax: [phone]

## 2017-10-27 LAB — BASIC METABOLIC PANEL
BUN/Creatinine Ratio: 22 (ref 9–23)
BUN: 20 mg/dL (ref 6–24)
CALCIUM: 9 mg/dL (ref 8.7–10.2)
CHLORIDE: 100 mmol/L (ref 96–106)
CO2: 32 mmol/L — ABNORMAL HIGH (ref 20–29)
Creatinine, Ser: 0.91 mg/dL (ref 0.57–1.00)
GFR calc non Af Amer: 70 mL/min/{1.73_m2} (ref >59)
GFR, EST AFRICAN AMERICAN: 80 mL/min/{1.73_m2} (ref >59)
Glucose: 136 mg/dL — ABNORMAL HIGH (ref 65–99)
Potassium: 4.4 mmol/L (ref 3.5–5.2)
Sodium: 145 mmol/L — ABNORMAL HIGH (ref 134–144)

## 2017-10-27 LAB — LIPID PANEL
Chol/HDL Ratio: 2.9 ratio (ref 0.0–4.4)
Cholesterol, Total: 168 mg/dL (ref 100–199)
HDL: 57 mg/dL (ref >39)
LDL Calculated: 95 mg/dL (ref 0–99)
TRIGLYCERIDES: 81 mg/dL (ref 0–149)
VLDL CHOLESTEROL CAL: 16 mg/dL (ref 5–40)

## 2017-11-03 ENCOUNTER — Encounter: Payer: Self-pay | Admitting: *Deleted

## 2017-11-06 ENCOUNTER — Other Ambulatory Visit: Payer: Self-pay | Admitting: Internal Medicine

## 2017-11-10 ENCOUNTER — Other Ambulatory Visit: Payer: Self-pay | Admitting: Internal Medicine

## 2017-12-22 ENCOUNTER — Ambulatory Visit: Payer: Medicare Other | Admitting: Podiatry

## 2017-12-30 LAB — HM DIABETES EYE EXAM

## 2018-01-05 ENCOUNTER — Ambulatory Visit: Payer: Medicare Other | Admitting: Neurology

## 2018-01-05 ENCOUNTER — Encounter: Payer: Self-pay | Admitting: Neurology

## 2018-01-05 VITALS — BP 128/72 | HR 82 | Ht 67.0 in | Wt 164.0 lb

## 2018-01-05 DIAGNOSIS — G90A Postural orthostatic tachycardia syndrome (POTS): Secondary | ICD-10-CM

## 2018-01-05 DIAGNOSIS — G2111 Neuroleptic induced parkinsonism: Secondary | ICD-10-CM | POA: Diagnosis not present

## 2018-01-05 DIAGNOSIS — E1049 Type 1 diabetes mellitus with other diabetic neurological complication: Secondary | ICD-10-CM | POA: Diagnosis not present

## 2018-01-05 DIAGNOSIS — G903 Multi-system degeneration of the autonomic nervous system: Secondary | ICD-10-CM | POA: Diagnosis not present

## 2018-01-05 DIAGNOSIS — G20 Parkinson's disease: Secondary | ICD-10-CM

## 2018-01-05 DIAGNOSIS — R000 Tachycardia, unspecified: Secondary | ICD-10-CM | POA: Diagnosis not present

## 2018-01-05 DIAGNOSIS — I951 Orthostatic hypotension: Secondary | ICD-10-CM

## 2018-01-05 NOTE — Progress Notes (Signed)
PATIENT: Lynn Lynn Barnes DOB: 05-May-1960  REASON FOR VISIT: follow up HISTORY FROM: patient  PCP : Dr Cathlean Cower, MD  Endocrinology :  Lorne Skeens, MD    RV 01-05-2018, follow up after 6 month visit with NP, last seen May 2019 by Ward Givens, NP.   Patient repots she is doing poorly, her legs have become very weak, shuffling gait and the feeling of " heavy "limbs.  " I can't walk more than 100 m without having to break". No SOB.  She feels her balance and fine motor skills are affected by tremor. "Penmanship has declined. Can't apply make up". No falls over 6 month, but near misses.    HISTORY OF PRESENT ILLNESS:  I have the pleasure of meeting today again with Lynn Lynn Barnes, meanwhile 57 year old patient, I had placed on carbidopa levodopa after she presented in February with mouth, jaw and tongue trembling and tremors.  She feels that this has been beneficial .   She is calmer without any obvious tremor today she still has vocal cord Trembling.  She also reports a greater fluidity of movement.   She had no further POTs related problems, I had always suspected that her dysautonomia was related to her juvenile diabetes long-standing insulin dependence.   However, I would not expect dysautonomia to get better on Sinemet. Shy- Draeger ?    May 2018 , MM- NP  Ms. 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 She returns today for follow-up. She was started on Sinemet at the last visit. She reports this has been beneficial. She states that her tremor has improved. Reports that her gait and balance have remained the same. Denies any falls. She does report decreased appetite and has lost weight since her last visit in February. She does report some trouble falling asleep. She reports that her tremor is primarily in the jaw and tongue. Denies any new neurological symptoms. He returns today for an evaluation.  HISTORY 04/02/16 Dr.  Brett Fairy: "there are times when my mouth, jaw and tongue trembling". Reglan induced? Lynn Lynn Barnes has not used Reglan in the past., Her depression however has been treated with Geodon and Risperdal. Neuroleptic induced? Lynn Lynn Barnes  She has been evaluated for Parkinsonism secondary induced due to medication here before, and one of her concerns is memory loss, the feeling of not tracking thought process all the way through.  I evaluated her memory last 8 year ago in Lynn Barnes 2017 when she scored 26 out of 30 points on a [place] cognitive assessment. Today she scored 25 out of 30 and I will print those results for comparison below.  I do not think that [name] has early onset dementia but maybe a cognitive disorder. She should take prenatal vitamins and I will try Artane for tremor.     REVIEW OF SYSTEMS: Out of a complete 14 system review of symptoms, the patient complains only of the following symptoms, and all other reviewed systems are negative.  Unchanged taste, vision and hearing and smell. improved tremor, improved POTS.  Improved diabetes control.   ALLERGIES: Allergies  Allergen Reactions  . Artane [Trihexyphenidyl]     Made tremors worse.   . Nsaids Other (See Comments)    Irritates stomach  . Sulfonamide Derivatives Rash    HOME MEDICATIONS: Outpatient Medications Prior to Visit  Medication Sig Dispense Refill  . atorvastatin (LIPITOR) 40 MG tablet TAKE 1 TABLET BY MOUTH  DAILY 90 tablet  2  . carbidopa-levodopa (SINEMET IR) 25-100 MG tablet Three times daily PO before meals. 270 tablet 3  . diazepam (VALIUM) 10 MG tablet TK 1 T PO BID  1  . divalproex (DEPAKOTE ER) 500 MG 24 hr tablet Take 1,000 mg by mouth at bedtime.    . fludrocortisone (FLORINEF) 0.1 MG tablet TAKE ONE-HALF TABLET BY  MOUTH TWICE A DAY 30 tablet 11  . glucose blood test strip 1 each by Other route. 7 times per day   - as instructed    . HUMALOG 100 UNIT/ML injection Insulin pump    . LATUDA 80 MG TABS tablet  TAKE 1 TABLET BY MOUTH WITH MEAL  0  . pindolol (VISKEN) 5 MG tablet TAKE 1 TABLET BY MOUTH TWO  TIMES DAILY 180 tablet 0  . risperidone (RISPERDAL) 4 MG tablet Take 8 mg by mouth at bedtime.  0  . zolpidem (AMBIEN) 10 MG tablet Take 10 mg by mouth at bedtime.  1  . diazepam (VALIUM) 5 MG tablet Take 5 mg by mouth 2 (two) times daily.    Lynn Lynn Barnes NOVOLOG 100 UNIT/ML injection Insulin pump     No facility-administered medications prior to visit.     PAST MEDICAL HISTORY: Past Medical History:  Diagnosis Date  . 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 ([place])    opium  . Addiction, opium ([place])   . Allergy    spring  . Anxiety    ADHD CHRONIC ANXIETY  . 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 Bipolar disorder ([place]) 02/15/2011  . Cervical spondylosis without myelopathy   . Depression   . Depression   . Diabetes mellitus    sees Dr Altheimer/endo  . Dysautonomia ([place]) 10/24/2011  . Dyskinesia     subacute,due to drugs  . Dyslipidemia   . History of nuclear stress test    Myoview 11/16: EF 81%, no ischemia; Low Risk  . Hyperlipidemia   . IBS (irritable bowel syndrome) 02/15/2011  . Insomnia 02/21/2011  . Multinodular thyroid    with large dominant solid nodule with calcifications in the lower left pole benign by FNA in 1/12 and stable by repeat ulltrasound in 4/13  . Neuropathy    with paresthesias in feet  . Orthostatic hypotension   . Orthostatic hypotension   . 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 Parkinsonism ([place])   . POTS (postural orthostatic tachycardia syndrome)   . Radiculopathy    cervical  . Resting tremor   . Rotator cuff syndrome of left shoulder   . S/P partial hysterectomy   . Spondylosis without myelopathy   . Subacute dyskinesia due to drugs(333.85)    dr Arohi Salvatierra/neuro  . Tachycardia    syndrome  . Therapy    on therapy by psychiatrist using sebutex   . Thyroid nodule    s/p biopsy - benign approx jan 2012  . Tremor     PAST  SURGICAL HISTORY: Past Surgical History:  Procedure Laterality Date  . ACROMIOPLASTY Left 2002   Dr Gladstone Lighter  . BREAST EXCISIONAL BIOPSY Right    benign  . BREAST LUMPECTOMY Right   . herniated disc cervical spine    . lower back  1992  . OOPHORECTOMY    . PARTIAL HYSTERECTOMY    . SPINE SURGERY  feb 2012   c5-6 fusion, Dr Rolena Infante  . THYROID LOBECTOMY  11/11   nodule biopsy, benign    FAMILY HISTORY: Family History  Problem Relation Age of Onset  . Lung cancer Mother   . Heart disease Father   . Hypertension Father   . Pneumonia Father   . Heart disease Paternal Grandmother   . ALS Paternal Grandmother   . Hypertension Sister     SOCIAL HISTORY: Social History   Socioeconomic History  . Marital status: Single    Spouse name: Not on file  . Number of children: 0  . Years of education: BA  . Highest education level: Not on file  Occupational History  . Occupation: Product manager: [employer]  Social Needs  . Financial resource strain: Not on file  . Food insecurity:    Worry: Not on file    Inability: Not on file  . Transportation needs:    Medical: Not on file    Non-medical: Not on file  Tobacco Use  . Smoking status: Never Smoker  . Smokeless tobacco: Never Used  Substance and Sexual Activity  . Alcohol use: No    Alcohol/week: 0.0 standard drinks  . Drug use: No  . Sexual activity: Never  Lifestyle  . Physical activity:    Days per week: Not on file    Minutes per session: Not on file  . Stress: Not on file  Relationships  . Social connections:    Talks on phone: Not on file    Gets together: Not on file    Attends religious service: Not on file    Active member of club or organization: Not on file    Attends meetings of clubs or organizations: Not on file    Relationship status: Not on file  . Intimate partner violence:    Fear of current or ex partner: Not on file    Emotionally abused: Not on file    Physically abused: Not  on file    Forced sexual activity: Not on file  Other Topics Concern  . Not on file  Social History Narrative   Patient is single and lives with my partner (Ione).   Patient is disabled.   Patient is right-handed.   Patient has a Haematologist.   Patient drinks one soda every other day.      PHYSICAL EXAM  Vitals:   01/05/18 1018  BP: 128/72  Pulse: 82  Weight: 164 lb (74.4 kg)  Height: 5\' 7"  (1.702 m)   Body mass index is 25.69 kg/m.  Generalized: Well developed, in no acute distress , reports intact memory.  She has a trembling speech, less facial  mimic and fine jaw tremor. Rare blink reflex. Dysphonia and dysphagia reported.   Neurological examination  Mentation: Alert oriented to time, place, history taking. Follows all commands. Masked face with little emotional mimick. . Cranial nerve : intact smell and taste sensation- Pupils were equal round reactive to light. Rare blink- hearing is intact.  Extraocular movements were full, visual field were full on confrontational test. Facial sensation was  normal. Motor:  symmetric motor tone is noted throughout. Weakness of abduction at the hip flexion at the hip , dorsiflexion at the feet.  She has weaker grip strength on the left. Loss of pinch strength, atrophy of the left thenar eminence.  Weakness of elbow extension.  There is no cog-wheeling, rather a waxy resistance. She is right hand dominant   Sensory: no burning or numbness in feet. No electric shock sensation.  No numbness in fingers.   Coordination:  finger-nose-finger intact bilaterally. There is  tremor, but it's low amplitude, not a resting tremor, mild dysmetria, ataxia.  Gait and station: Patient has decreased arm swing- stooped posture. Turns with 4 steps. Propulsive tendency . No tremor in either hand while walking.  Reflexes: Deep tendon reflexes are symmetric and normal bilaterally.   DIAGNOSTIC DATA (LABS, IMAGING, TESTING) - I reviewed  patient records, labs, notes, testing and imaging myself where available.  Lab Results  Component Value Date   WBC 4.8 08/21/2016   HGB 14.5 08/21/2016   HCT 42.0 08/21/2016   MCV 92.6 08/21/2016   PLT 281.0 08/21/2016      Component Value Date/Time   NA 145 (H) 10/26/2017 1347   K 4.4 10/26/2017 1347   CL 100 10/26/2017 1347   CO2 32 (H) 10/26/2017 1347   GLUCOSE 136 (H) 10/26/2017 1347   GLUCOSE 144 (H) 07/06/2015 1523   BUN 20 10/26/2017 1347   CREATININE 0.91 10/26/2017 1347   CREATININE 0.87 07/06/2015 1523   CALCIUM 9.0 10/26/2017 1347   PROT 6.4 04/02/2016 1659   ALBUMIN 4.1 04/02/2016 1659   AST 17 04/02/2016 1659   ALT 19 04/02/2016 1659   ALKPHOS 88 04/02/2016 1659   BILITOT 0.3 04/02/2016 1659   GFRNONAA 70 10/26/2017 1347   GFRAA 80 10/26/2017 1347   Lab Results  Component Value Date   CHOL 168 10/26/2017   HDL 57 10/26/2017   LDLCALC 95 10/26/2017   TRIG 81 10/26/2017   CHOLHDL 2.9 10/26/2017   Lab Results  Component Value Date   HGBA1C 9.0 (H) 08/17/2014   Lab Results  Component Value Date   DDUKGURK27 062 (H) 08/17/2014   Lab Results  Component Value Date   TSH 1.57 08/21/2016      ASSESSMENT AND PLAN:  57 y.o. year old female patient with DM type ine since childhood and mood disorders.   1. Parkinsonism, secondary. Responding to carbidopa levodopa.  2.  Postural orthostatic tachycardia syndrome, attributed to dysautonomia in relation to a diabetic NP.   3. Essential tremor on depakote.   4. Ataxia - unclear if NP related. May need a brain MRI cervical spine MRI. There is not incontinence.   The patient feels that Sinemet has been beneficial for her she will continue on Sinemet 25-100 mg three times a day- before a meal , 30 minutes.    She wants to see if tremor improved off depakote -I permitted a drug holiday for the first week of December helping [place] to see if there is a significant benefit.    I want to order a DAT scan to  help [place] for differential dx.      She will follow-up in 3-6 months with NP- after that ( alternating visit q 6 month with me, Dr. Brett Fairy), and will have a MOCA test, too.   I spent 30 minutes with the patient 50% of this time was spent discussing patient's medication.   [name], MD  01/05/2018, 10:33 AM [facility] [address], [place], [place] [address] [phone]

## 2018-01-06 ENCOUNTER — Telehealth: Payer: Self-pay | Admitting: Neurology

## 2018-01-06 LAB — COMPREHENSIVE METABOLIC PANEL
ALBUMIN: 3.9 g/dL (ref 3.5–5.5)
ALT: 3 IU/L (ref 0–32)
AST: 12 IU/L (ref 0–40)
Albumin/Globulin Ratio: 1.9 (ref 1.2–2.2)
Alkaline Phosphatase: 66 IU/L (ref 39–117)
BUN/Creatinine Ratio: 20 (ref 9–23)
BUN: 19 mg/dL (ref 6–24)
Bilirubin Total: 0.4 mg/dL (ref 0.0–1.2)
CO2: 30 mmol/L — AB (ref 20–29)
CREATININE: 0.96 mg/dL (ref 0.57–1.00)
Calcium: 9.3 mg/dL (ref 8.7–10.2)
Chloride: 101 mmol/L (ref 96–106)
GFR calc non Af Amer: 65 mL/min/{1.73_m2} (ref >59)
GFR, EST AFRICAN AMERICAN: 75 mL/min/{1.73_m2} (ref >59)
GLUCOSE: 352 mg/dL — AB (ref 65–99)
Globulin, Total: 2.1 g/dL (ref 1.5–4.5)
Potassium: 3.6 mmol/L (ref 3.5–5.2)
Sodium: 144 mmol/L (ref 134–144)
TOTAL PROTEIN: 6 g/dL (ref 6.0–8.5)

## 2018-01-06 NOTE — Telephone Encounter (Signed)
-----   Message from [name], MD sent at 01/06/2018  8:54 AM EST ----- High non fast glucose, ok to have MRI contrast/ DAT scan.

## 2018-01-06 NOTE — Telephone Encounter (Signed)
Called and spoke with the patient's wife informing her of the lab results and informing her that ok to move forward with getting the DAT scan completed. Pt's wife verbalized understanding.

## 2018-01-14 ENCOUNTER — Other Ambulatory Visit: Payer: Self-pay | Admitting: Internal Medicine

## 2018-01-14 MED ORDER — PINDOLOL 5 MG PO TABS: 5.0000 mg | ORAL_TABLET | Freq: Two times a day (BID) | ORAL | 2 refills | Status: DC

## 2018-01-18 ENCOUNTER — Ambulatory Visit: Payer: Medicare Other | Admitting: Adult Health

## 2018-01-21 ENCOUNTER — Telehealth: Payer: Self-pay | Admitting: Neurology

## 2018-01-28 ENCOUNTER — Telehealth: Payer: Self-pay | Admitting: Neurology

## 2018-01-28 NOTE — Telephone Encounter (Signed)
Spoke with Lynn Barnes (listed on DPR). Anne Ng called and wanted to know about the DAT scan. I relayed the information about the apt to [facility] but she stated that no one had contacted them to make the apt. I called [facility] and got all the details and called them back to make them aware. I also gave them the number to [facility] scheduling in case they decide to change the apt. DW

## 2018-02-17 ENCOUNTER — Encounter (HOSPITAL_COMMUNITY)
Admission: RE | Admit: 2018-02-17 | Discharge: 2018-02-17 | Disposition: A | Discharge status: Home / Self Care | Payer: Medicare Other | Source: Ambulatory Visit | Attending: Neurology | Admitting: Neurology

## 2018-02-17 DIAGNOSIS — I951 Orthostatic hypotension: Secondary | ICD-10-CM | POA: Diagnosis present

## 2018-02-17 DIAGNOSIS — R000 Tachycardia, unspecified: Secondary | ICD-10-CM | POA: Insufficient documentation

## 2018-02-17 DIAGNOSIS — E1049 Type 1 diabetes mellitus with other diabetic neurological complication: Secondary | ICD-10-CM

## 2018-02-17 DIAGNOSIS — G903 Multi-system degeneration of the autonomic nervous system: Secondary | ICD-10-CM | POA: Diagnosis present

## 2018-02-17 DIAGNOSIS — G90A Postural orthostatic tachycardia syndrome (POTS): Secondary | ICD-10-CM

## 2018-02-17 DIAGNOSIS — G2111 Neuroleptic induced parkinsonism: Secondary | ICD-10-CM

## 2018-02-17 DIAGNOSIS — G20 Parkinson's disease: Secondary | ICD-10-CM

## 2018-02-17 MED ORDER — IODINE STRONG (LUGOLS) 5 % PO SOLN: 0.8000 mL | Freq: Once | ORAL | Status: DC

## 2018-02-17 MED ORDER — IOFLUPANE I 123 185 MBQ/2.5ML IV SOLN
5.0000 | Freq: Once | INTRAVENOUS | Status: AC
  Administered 2018-02-17: 5 via INTRAVENOUS

## 2018-02-22 ENCOUNTER — Telehealth: Payer: Self-pay | Admitting: Neurology

## 2018-02-22 NOTE — Telephone Encounter (Signed)
Called both the patient and her spouse. No answer. LVM on the patient's mobile phone.

## 2018-02-22 NOTE — Telephone Encounter (Signed)
-----   Message from [name], MD sent at 02/18/2018  4:51 PM EST ----- FINDINGS: Symmetric uptake within the LEFT and RIGHT striata. Normal uptake within the head of the caudate nuclei and putamen. No reduced activity 2 suggest loss of dopamine transporter populations.  IMPRESSION: Normal scan without evidence of Parkinson's syndrome.   Electronically Signed   By: Suzy Bouchard M.D.   Negative for primary parkinsons disease-  This is medication induced or microvascular in origin.

## 2018-02-24 NOTE — Telephone Encounter (Signed)
Called and there was no answer. LVM for them to call back to review results.

## 2018-02-24 NOTE — Telephone Encounter (Signed)
Pt returned call and I was able to review the DAT scan results with her informing her that primary PD was not seen. Informed the patient Dr Brett Fairy would review more in detail with her at her upcoming apt in feb. Pt verbalized understanding. Pt had no questions at this time but was encouraged to call back if questions arise.

## 2018-03-22 ENCOUNTER — Encounter: Payer: Self-pay | Admitting: *Deleted

## 2018-03-22 NOTE — Progress Notes (Addendum)
Subjective:   Lynn Barnes is a 58 y.o. female who presents for an Initial Medicare Annual Wellness Visit.  Review of Systems    No ROS.  Medicare Wellness Visit. Additional risk factors are reflected in the social history.  Cardiac Risk Factors include: advanced age (>65men, >31 women);diabetes mellitus;dyslipidemia Sleep patterns: feels rested on waking, gets up 1 times nightly to void and sleeps 6-7 hours nightly.    Home Safety/Smoke Alarms: Feels safe in home. Smoke alarms in place.  Living environment; residence and Firearm Safety: 1-story house/ trailer. Lives with significant other, no needs for DME, good support system Seat Belt Safety/Bike Helmet: Wears seat belt.     Objective:    Today's Vitals   03/23/18 1403  BP: 138/76  Pulse: 95  Resp: 18  SpO2: 97%  Weight: 155 lb (70.3 kg)  Height: 5\' 7"  (1.702 m)   Body mass index is 24.28 kg/m.  Advanced Directives 03/23/2018 08/21/2014  Does Patient Have a Medical Advance Directive? Yes Yes  Type of Paramedic of [place];Living will [place];Living will  Copy of [place] in Chart? No - copy requested No - copy requested    Current Medications (verified) Outpatient Encounter Medications as of 03/23/2018  Medication Sig  . atorvastatin (LIPITOR) 40 MG tablet TAKE 1 TABLET BY MOUTH  DAILY  . carbidopa-levodopa (SINEMET IR) 25-100 MG tablet Three times daily PO before meals.  . diazepam (VALIUM) 10 MG tablet TK 1 T PO BID  . divalproex (DEPAKOTE ER) 500 MG 24 hr tablet Take 1,000 mg by mouth at bedtime.  . fludrocortisone (FLORINEF) 0.1 MG tablet TAKE ONE-HALF TABLET BY  MOUTH TWICE A DAY  . Glucagon 3 MG/DOSE POWD Place into the nose.  [name] glucose blood test strip 1 each by Other route. 7 times per day   - as instructed  . HUMALOG KWIKPEN 100 UNIT/ML KwikPen INJECT BEFORE EACH MEAL WITH 1 UNIT PER 10 GM CARBS PLUS CORRECTIN OF 1 UNIT PER 50 MG  .  insulin degludec (TRESIBA FLEXTOUCH) 100 UNIT/ML SOPN FlexTouch Pen Inject 24 units at bedtime daily  . LATUDA 80 MG TABS tablet TAKE 1 TABLET BY MOUTH WITH MEAL  . pindolol (VISKEN) 5 MG tablet Take 1 tablet (5 mg total) by mouth 2 (two) times daily.  . risperidone (RISPERDAL) 4 MG tablet Take 8 mg by mouth at bedtime.  [name] zolpidem (AMBIEN) 10 MG tablet Take 10 mg by mouth at bedtime.  . [DISCONTINUED] HUMALOG 100 UNIT/ML injection Insulin pump   No facility-administered encounter medications on file as of 03/23/2018.     Allergies (verified) Artane [trihexyphenidyl]; Nsaids; and Sulfonamide derivatives   History: Past Medical History:  Diagnosis Date  . 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 Addiction, opium ([place])   . Allergy    spring  . Anxiety    ADHD CHRONIC ANXIETY  . 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 Bipolar disorder ([place]) 02/15/2011  . Cervical spondylosis without myelopathy   . Depression   . Depression   . Diabetes mellitus    sees Dr Altheimer/endo  . Dysautonomia ([place]) 10/24/2011  . Dyskinesia     subacute,due to drugs  . Dyslipidemia   . History of nuclear stress test    Myoview 11/16: EF 81%, no ischemia; Low Risk  . Hyperlipidemia   . IBS (irritable bowel syndrome) 02/15/2011  . Insomnia 02/21/2011  . Multinodular thyroid  with large dominant solid nodule with calcifications in the lower left pole benign by FNA in 1/12 and stable by repeat ulltrasound in 4/13  . Neuropathy    with paresthesias in feet  . Orthostatic hypotension   . Orthostatic hypotension   . 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 Parkinsonism ([place])   . POTS (postural orthostatic tachycardia syndrome)   . Radiculopathy    cervical  . Resting tremor   . Rotator cuff syndrome of left shoulder   . S/P partial hysterectomy   . Spondylosis without myelopathy   . Subacute dyskinesia due to drugs(333.85)    dr dohmeier/neuro  . Tachycardia     syndrome  . Therapy    on therapy by psychiatrist using sebutex   . Thyroid nodule    s/p biopsy - benign approx jan 2012  . Tremor    Past Surgical History:  Procedure Laterality Date  . ACROMIOPLASTY Left 2002   Dr Gladstone Lighter  . BREAST EXCISIONAL BIOPSY Right    benign  . BREAST LUMPECTOMY Right   . herniated disc cervical spine    . lower back  1992  . OOPHORECTOMY    . PARTIAL HYSTERECTOMY    . SHOULDER SURGERY Left   . SPINE SURGERY  feb 2012   c5-6 fusion, Dr Rolena Infante  . THYROID LOBECTOMY  11/11   nodule biopsy, benign   Family History  Problem Relation Age of Onset  . Lung cancer Mother   . Heart disease Father   . Hypertension Father   . Pneumonia Father   . Heart disease Paternal Grandmother   . ALS Paternal Grandmother   . Hypertension Sister    Social History   Socioeconomic History  . Marital status: Married    Spouse name: Not on file  . Number of children: 0  . Years of education: BA  . Highest education level: Not on file  Occupational History  . Occupation: Product manager: [employer]  Social Needs  . Financial resource strain: Not hard at all  . Food insecurity:    Worry: Never true    Inability: Never true  . Transportation needs:    Medical: No    Non-medical: No  Tobacco Use  . Smoking status: Never Smoker  . Smokeless tobacco: Never Used  Substance and Sexual Activity  . Alcohol use: No    Alcohol/week: 0.0 standard drinks  . Drug use: No  . Sexual activity: Never  Lifestyle  . Physical activity:    Days per week: 3 days    Minutes per session: 40 min  . Stress: Not at all  Relationships  . Social connections:    Talks on phone: More than three times a week    Gets together: More than three times a week    Attends religious service: More than 4 times per year    Active member of club or organization: Yes    Attends meetings of clubs or organizations: More than 4 times per year    Relationship status: Married    Other Topics Concern  . Not on file  Social History Narrative   Patient is single and lives with my partner (Nittany).   Patient is disabled.   Patient is right-handed.   Patient has a Haematologist.   Patient drinks one soda every other day.    Tobacco Counseling Counseling given: Not Answered  Activities of Daily Living  In your present state of health, do you have any difficulty performing the following activities: 03/23/2018  Hearing? N  Vision? N  Difficulty concentrating or making decisions? N  Walking or climbing stairs? N  Dressing or bathing? N  Doing errands, shopping? N  Preparing Food and eating ? N  Using the Toilet? N  In the past six months, have you accidently leaked urine? N  Do you have problems with loss of bowel control? N  Managing your Medications? N  Managing your Finances? N  Housekeeping or managing your Housekeeping? N  Some recent data might be hidden     Immunizations and Health Maintenance Immunization History  Administered Date(s) Administered  . Influenza Whole 11/11/2007, 04/24/2008  . Influenza, Seasonal, Injecte, Preservative Fre 11/10/2012  . Influenza,inj,Quad PF,6+ Mos 09/27/2013  . Influenza-Unspecified 11/13/2017  . Pneumococcal Conjugate-13 08/21/2016  . Pneumococcal Polysaccharide-23 08/24/2017   Health Maintenance Due  Topic Date Due  . HEMOGLOBIN A1C  02/17/2015  . URINE MICROALBUMIN  02/28/2017  . FOOT EXAM  08/21/2017    Patient Care Team: Biagio Borg, MD as PCP - General (Internal Medicine) Fay Records, MD as PCP - Cardiology (Cardiology) Dohmeier, Asencion Partridge, MD as Consulting Physician (Neurology) Altheimer, Legrand Como, MD as Referring Physician (Endocrinology) Dan Humphreys (Nurse Practitioner)  Indicate any recent Medical Services you may have received from other than [facility] providers in the past year (date may be approximate).     Assessment:   This is a routine wellness examination for [place].  Physical assessment deferred to PCP.  Hearing/Vision screen Hearing Screening Comments: Able to hear conversational tones w/o difficulty. No issues reported.  Passed whisper test Vision Screening Comments: appointment yearly Dr. Kathlen Mody   Dietary issues and exercise activities discussed: Current Exercise Habits: Home exercise routine, Type of exercise: walking, Time (Minutes): 40, Frequency (Times/Week): 3, Weekly Exercise (Minutes/Week): 120, Intensity: Mild, Exercise limited by: neurologic condition(s)  Diet (meal preparation, eat out, water intake, caffeinated beverages, dairy products, fruits and vegetables): in general, a "healthy" diet  , on average, Protein shake for breakfast and lunch, eats a meal for dinner. meals per day. 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  Discussed eating more than 1 meal per day. Encouraged patient to increase daily water and healthy fluid intake.  Goals    . Patient Stated     Increase the amount physical activity by continuing to walk and go to the gym once a week.      Depression Screen PHQ 2/9 Scores 03/23/2018 08/24/2017 08/21/2016 08/21/2015 09/27/2013  PHQ - 2 Score 2 2 0 1 0  PHQ- 9 Score 7 - - - -    Fall Risk Fall Risk  03/23/2018 01/05/2018 08/24/2017 08/21/2016 04/02/2016  Falls in the past year? 1 0 Yes Yes Yes  Number falls in past yr: 0 - 2 or more 2 or more 1  Comment - - - - -  Injury with Fall? 0 - No No No  Risk Factor Category  - - - High Fall Risk -  Risk for fall due to : Impaired mobility;Impaired balance/gait - - - -  Follow up Falls prevention discussed - - - Falls prevention discussed    Cognitive Function: MMSE - Mini Mental State Exam 08/21/2014  Orientation to time 5  Orientation to Place 5  Registration 3  Attention/ Calculation 5  Recall 3  Language- name 2 objects 2  Language- repeat 1  Language- follow 3 step command  2  Language- read & follow direction 1  Write a sentence 1  Copy  design 1  Total score 29   Montreal Cognitive Assessment  04/02/2016 02/21/2015  Visuospatial/ Executive (0/5) 5 5  Naming (0/3) 3 3  Attention: Read list of digits (0/2) 2 2  Attention: Read list of letters (0/1) 1 1  Attention: Serial 7 subtraction starting at 100 (0/3) 3 3  Language: Repeat phrase (0/2) 2 2  Language : Fluency (0/1) 0 0  Abstraction (0/2) 2 2  Delayed Recall (0/5) 2 3  Orientation (0/6) 5 5  Total 25 26  Adjusted Score (based on education) 25 26     Ad8 score reviewed for issues:  Issues making decisions: no  Less interest in hobbies / activities: no  Repeats questions, stories (family complaining): no  Trouble using ordinary gadgets (microwave, computer, phone):no  Forgets the month or year: no  Mismanaging finances: no  Remembering appts: no  Daily problems with thinking and/or memory: no Ad8 score is= 0 Screening Tests Health Maintenance  Topic Date Due  . HEMOGLOBIN A1C  02/17/2015  . URINE MICROALBUMIN  02/28/2017  . FOOT EXAM  08/21/2017  . PAP SMEAR-Modifier  03/23/2018 (Originally 09/05/2008)  . COLONOSCOPY  08/25/2018 (Originally 05/14/2016)  . OPHTHALMOLOGY EXAM  12/31/2018  . MAMMOGRAM  09/16/2019  . TETANUS/TDAP  12/11/2020  . INFLUENZA VACCINE  Completed  . PNEUMOCOCCAL POLYSACCHARIDE VACCINE AGE 22-64 HIGH RISK  Completed  . Hepatitis C Screening  Completed  . HIV Screening  Completed     Plan:   Reviewed health maintenance screenings with patient today and relevant education, vaccines, and/or referrals were provided.   Continue doing brain stimulating activities (puzzles, reading, adult coloring books, staying active) to keep memory sharp.   Continue to eat heart healthy diet (full of fruits, vegetables, whole grains, lean protein, water--limit salt, fat, and sugar intake) and increase physical activity as tolerated.  I have personally reviewed and noted the following in the patient's chart:   . Medical and social  history . Use of alcohol, tobacco or illicit drugs  . Current medications and supplements . Functional ability and status . Nutritional status . Physical activity . Advanced directives . List of other physicians . Vitals . Screenings to include cognitive, depression, and falls . Referrals and appointments  In addition, I have reviewed and discussed with patient certain preventive protocols, quality metrics, and best practice recommendations. A written personalized care plan for preventive services as well as general preventive health recommendations were provided to patient.     Michiel Cowboy, RN   03/23/2018     Medical screening examination/treatment/procedure(s) were performed by non-physician practitioner and as supervising physician I was immediately available for consultation/collaboration. I agree with above. Cathlean Cower, MD

## 2018-03-23 ENCOUNTER — Ambulatory Visit (INDEPENDENT_AMBULATORY_CARE_PROVIDER_SITE_OTHER): Payer: Medicare Other | Admitting: *Deleted

## 2018-03-23 VITALS — BP 138/76 | HR 95 | Temp 98.6°F | Resp 18 | Ht 67.0 in | Wt 155.0 lb

## 2018-03-23 DIAGNOSIS — Z0000 Encounter for general adult medical examination without abnormal findings: Secondary | ICD-10-CM

## 2018-03-23 NOTE — Patient Instructions (Addendum)
Continue doing brain stimulating activities (puzzles, reading, adult coloring books, staying active) to keep memory sharp.   Continue to eat heart healthy diet (full of fruits, vegetables, whole grains, lean protein, water--limit salt, fat, and sugar intake) and increase physical activity as tolerated.   [name] , Thank you for taking time to come for your Medicare Wellness Visit. I appreciate your ongoing commitment to your health goals. Please review the following plan we discussed and let me know if I can assist you in the future.   These are the goals we discussed: Goals    . Patient Stated     Increase the amount physical activity by continuing to walk and go to the gym once a week.       This is a list of the screening recommended for you and due dates:  Health Maintenance  Topic Date Due  . Pap Smear  09/05/2008  . Hemoglobin A1C  02/17/2015  . Urine Protein Check  02/28/2017  . Complete foot exam   08/21/2017  . Colon Cancer Screening  08/25/2018*  . Eye exam for diabetics  12/31/2018  . Mammogram  09/16/2019  . Tetanus Vaccine  12/11/2020  . Flu Shot  Completed  . Pneumococcal vaccine  Completed  .  Hepatitis C: One time screening is recommended by Center for Disease Control  (CDC) for  adults born from 1945 through 1965.   Completed  . HIV Screening  Completed  *Topic was postponed. The date shown is not the original due date.   Health Maintenance, Female Adopting a healthy lifestyle and getting preventive care can go a long way to promote health and wellness. Talk with your health care provider about what schedule of regular examinations is right for you. This is a good chance for you to check in with your provider about disease prevention and staying healthy. In between checkups, there are plenty of things you can do on your own. Experts have done a lot of research about which lifestyle changes and preventive measures are most likely to keep you healthy. Ask your  health care provider for more information. Weight and diet Eat a healthy diet  Be sure to include plenty of vegetables, fruits, low-fat dairy products, and lean protein.  Do not eat a lot of foods high in solid fats, added sugars, or salt.  Get regular exercise. This is one of the most important things you can do for your health. ? Most adults should exercise for at least 150 minutes each week. The exercise should increase your heart rate and make you sweat (moderate-intensity exercise). ? Most adults should also do strengthening exercises at least twice a week. This is in addition to the moderate-intensity exercise. Maintain a healthy weight  Body mass index (BMI) is a measurement that can be used to identify possible weight problems. It estimates body fat based on height and weight. Your health care provider can help determine your BMI and help you achieve or maintain a healthy weight.  For females 20 years of age and older: ? A BMI below 18.5 is considered underweight. ? A BMI of 18.5 to 24.9 is normal. ? A BMI of 25 to 29.9 is considered overweight. ? A BMI of 30 and above is considered obese. Watch levels of cholesterol and blood lipids  You should start having your blood tested for lipids and cholesterol at 59 years of age, then have this test every 5 years.  You may need to have your cholesterol   levels checked more often if: ? Your lipid or cholesterol levels are high. ? You are older than 59 years of age. ? You are at high risk for heart disease. Cancer screening Lung Cancer  Lung cancer screening is recommended for adults 68-67 years old who are at high risk for lung cancer because of a history of smoking.  A yearly low-dose CT scan of the lungs is recommended for people who: ? Currently smoke. ? Have quit within the past 15 years. ? Have at least a 30-pack-year history of smoking. A pack year is smoking an average of one pack of cigarettes a day for 1 year.  Yearly  screening should continue until it has been 15 years since you quit.  Yearly screening should stop if you develop a health problem that would prevent you from having lung cancer treatment. Breast Cancer  Practice breast self-awareness. This means understanding how your breasts normally appear and feel.  It also means doing regular breast self-exams. Let your health care provider know about any changes, no matter how small.  If you are in your 20s or 30s, you should have a clinical breast exam (CBE) by a health care provider every 1-3 years as part of a regular health exam.  If you are 43 or older, have a CBE every year. Also consider having a breast X-ray (mammogram) every year.  If you have a family history of breast cancer, talk to your health care provider about genetic screening.  If you are at high risk for breast cancer, talk to your health care provider about having an MRI and a mammogram every year.  Breast cancer gene (BRCA) assessment is recommended for women who have family members with BRCA-related cancers. BRCA-related cancers include: ? Breast. ? Ovarian. ? Tubal. ? Peritoneal cancers.  Results of the assessment will determine the need for genetic counseling and BRCA1 and BRCA2 testing. Cervical Cancer Your health care provider may recommend that you be screened regularly for cancer of the pelvic organs (ovaries, uterus, and vagina). This screening involves a pelvic examination, including checking for microscopic changes to the surface of your cervix (Pap test). You may be encouraged to have this screening done every 3 years, beginning at age 23.  For women ages 37-65, health care providers may recommend pelvic exams and Pap testing every 3 years, or they may recommend the Pap and pelvic exam, combined with testing for human papilloma virus (HPV), every 5 years. Some types of HPV increase your risk of cervical cancer. Testing for HPV may also be done on women of any age with  unclear Pap test results.  Other health care providers may not recommend any screening for nonpregnant women who are considered low risk for pelvic cancer and who do not have symptoms. Ask your health care provider if a screening pelvic exam is right for you.  If you have had past treatment for cervical cancer or a condition that could lead to cancer, you need Pap tests and screening for cancer for at least 20 years after your treatment. If Pap tests have been discontinued, your risk factors (such as having a new sexual partner) need to be reassessed to determine if screening should resume. Some women have medical problems that increase the chance of getting cervical cancer. In these cases, your health care provider may recommend more frequent screening and Pap tests. Colorectal Cancer  This type of cancer can be detected and often prevented.  Routine colorectal cancer screening usually begins at  59 years of age and continues through 59 years of age.  Your health care provider may recommend screening at an earlier age if you have risk factors for colon cancer.  Your health care provider may also recommend using home test kits to check for hidden blood in the stool.  A small camera at the end of a tube can be used to examine your colon directly (sigmoidoscopy or colonoscopy). This is done to check for the earliest forms of colorectal cancer.  Routine screening usually begins at age 3.  Direct examination of the colon should be repeated every 5-10 years through 58 years of age. However, you may need to be screened more often if early forms of precancerous polyps or small growths are found. Skin Cancer  Check your skin from head to toe regularly.  Tell your health care provider about any new moles or changes in moles, especially if there is a change in a mole's shape or color.  Also tell your health care provider if you have a mole that is larger than the size of a pencil eraser.  Always  use sunscreen. Apply sunscreen liberally and repeatedly throughout the day.  Protect yourself by wearing long sleeves, pants, a wide-brimmed hat, and sunglasses whenever you are outside. Heart disease, diabetes, and high blood pressure  High blood pressure causes heart disease and increases the risk of stroke. High blood pressure is more likely to develop in: ? People who have blood pressure in the high end of the normal range (130-139/85-89 mm Hg). ? People who are overweight or obese. ? People who are African American.  If you are 73-33 years of age, have your blood pressure checked every 3-5 years. If you are 77 years of age or older, have your blood pressure checked every year. You should have your blood pressure measured twice-once when you are at a hospital or clinic, and once when you are not at a hospital or clinic. Record the average of the two measurements. To check your blood pressure when you are not at a hospital or clinic, you can use: ? An automated blood pressure machine at a pharmacy. ? A home blood pressure monitor.  If you are between 76 years and 68 years old, ask your health care provider if you should take aspirin to prevent strokes.  Have regular diabetes screenings. This involves taking a blood sample to check your fasting blood sugar level. ? If you are at a normal weight and have a low risk for diabetes, have this test once every three years after 59 years of age. ? If you are overweight and have a high risk for diabetes, consider being tested at a younger age or more often. Preventing infection Hepatitis B  If you have a higher risk for hepatitis B, you should be screened for this virus. You are considered at high risk for hepatitis B if: ? You were born in a country where hepatitis B is common. Ask your health care provider which countries are considered high risk. ? Your parents were born in a high-risk country, and you have not been immunized against hepatitis B  (hepatitis B vaccine). ? You have HIV or AIDS. ? You use needles to inject street drugs. ? You live with someone who has hepatitis B. ? You have had sex with someone who has hepatitis B. ? You get hemodialysis treatment. ? You take certain medicines for conditions, including cancer, organ transplantation, and autoimmune conditions. Hepatitis C  Blood testing  is recommended for: ? Everyone born from 1945 through 1965. ? Anyone with known risk factors for hepatitis C. Sexually transmitted infections (STIs)  You should be screened for sexually transmitted infections (STIs) including gonorrhea and chlamydia if: ? You are sexually active and are younger than 59 years of age. ? You are older than 59 years of age and your health care provider tells you that you are at risk for this type of infection. ? Your sexual activity has changed since you were last screened and you are at an increased risk for chlamydia or gonorrhea. Ask your health care provider if you are at risk.  If you do not have HIV, but are at risk, it may be recommended that you take a prescription medicine daily to prevent HIV infection. This is called pre-exposure prophylaxis (PrEP). You are considered at risk if: ? You are sexually active and do not regularly use condoms or know the HIV status of your partner(s). ? You take drugs by injection. ? You are sexually active with a partner who has HIV. Talk with your health care provider about whether you are at high risk of being infected with HIV. If you choose to begin PrEP, you should first be tested for HIV. You should then be tested every 3 months for as long as you are taking PrEP. Pregnancy  If you are premenopausal and you may become pregnant, ask your health care provider about preconception counseling.  If you may become pregnant, take 400 to 800 micrograms (mcg) of folic acid every day.  If you want to prevent pregnancy, talk to your health care provider about birth  control (contraception). Osteoporosis and menopause  Osteoporosis is a disease in which the bones lose minerals and strength with aging. This can result in serious bone fractures. Your risk for osteoporosis can be identified using a bone density scan.  If you are 65 years of age or older, or if you are at risk for osteoporosis and fractures, ask your health care provider if you should be screened.  Ask your health care provider whether you should take a calcium or vitamin D supplement to lower your risk for osteoporosis.  Menopause may have certain physical symptoms and risks.  Hormone replacement therapy may reduce some of these symptoms and risks. Talk to your health care provider about whether hormone replacement therapy is right for you. Follow these instructions at home:  Schedule regular health, dental, and eye exams.  Stay current with your immunizations.  Do not use any tobacco products including cigarettes, chewing tobacco, or electronic cigarettes.  If you are pregnant, do not drink alcohol.  If you are breastfeeding, limit how much and how often you drink alcohol.  Limit alcohol intake to no more than 1 drink per day for nonpregnant women. One drink equals 12 ounces of beer, 5 ounces of wine, or 1 ounces of hard liquor.  Do not use street drugs.  Do not share needles.  Ask your health care provider for help if you need support or information about quitting drugs.  Tell your health care provider if you often feel depressed.  Tell your health care provider if you have ever been abused or do not feel safe at home. This information is not intended to replace advice given to you by your health care provider. Make sure you discuss any questions you have with your health care provider. Document Released: 08/12/2010 Document Revised: 07/05/2015 Document Reviewed: 10/31/2014 [employer] Interactive Patient Education  2019 [employer]

## 2018-04-06 NOTE — Progress Notes (Signed)
PATIENT: Lynn Barnes DOB: 11-03-1960  REASON FOR VISIT: follow up HISTORY FROM: patient   Chief Complaint  Patient presents with  . Follow-up    3 month follow up. Alone. New room. Patient mentioned that she is still having some trouble walking, tremors and stiffness to her legs.      HISTORY OF PRESENT ILLNESS: Today 04/07/18 Lynn Barnes is a 58 y.o. 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  She was started on Sinemet about 2 years ago.  Sinemet helps but she still have some trouble walking and a tremor. She feels tremor is worse with activity like writing or drinking out of a cup.  She is able to perform all ADLs.  She does feel that she still has some trouble walking.  She has not had any falls.  She does not use any assistive devices.  DaTscan in November did not reveal any primary Parkinson's disease.  She is seen every three months by her psychiatrist, Dr Gus Height.  She continues Latuda, Risperdal, Valium and Ambien prescribed.  She reports that she continues to struggle with some anxiety but feels that she is stable overall.  She is seeing Dorris Carnes for POTS and reports that symptoms are controlled. She is taking Pindolol 5mg  BID.    HISTORY: (copied from Dr Dohmeier's note on 01/05/2018) RV 01-05-2018, follow up after 6 month visit with NP, last seen May 2019 by Ward Givens, NP.   Patient repots she is doing poorly, her legs have become very weak, shuffling gait and the feeling of " heavy "limbs.  " I can't walk more than 100 m without having to break". No SOB.  She feels her balance and fine motor skills are affected by tremor. "Penmanship has declined. Can't apply make up". No falls over 6 month, but near misses.    HISTORY OF PRESENT ILLNESS:  I have the pleasure of meeting today again with Lynn Barnes, meanwhile 58 year old patient, I had placed on carbidopa levodopa after she presented in February with mouth, jaw and  tongue trembling and tremors.  She feels that this has been beneficial .   She is calmer without any obvious tremor today she still has vocal cord Trembling.  She also reports a greater fluidity of movement.   She had no further POTs related problems, I had always suspected that her dysautonomia was related to her juvenile diabetes long-standing insulin dependence.   However, I would not expect dysautonomia to get better on Sinemet. Shy- Draeger ?    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 She returns today for follow-up. She was started on Sinemet at the last visit. She reports this has been beneficial. She states that her tremor has improved. Reports that her gait and balance have remained the same. Denies any falls. She does report decreased appetite and has lost weight since her last visit in February. She does report some trouble falling asleep. She reports that her tremor is primarily in the jaw and tongue. Denies any new neurological symptoms. He returns today for an evaluation.  HISTORY 04/02/16 Dr. Brett Fairy: "there are times when my mouth, jaw and tongue trembling". Reglan induced? Mrs. Barnes has not used Reglan in the past., Her depression however has been treated with Geodon and Risperdal.Neuroleptic induced? [name]  She has been evaluated for Parkinsonism secondary induced due to medication here before,  and one of her concerns is memory loss, the feeling of not tracking thought process all the way through.  I evaluated her memory last 8 year ago in Barnes 2017 when she scored 26 out of 30 points on a [place] cognitive assessment. Today she scored 25 out of 30 and I will print those results for comparison below.  I do not think that Lynn Barnes has early onset dementia but maybe a cognitive disorder. She should take prenatal vitamins and I will try Artane for tremor.   REVIEW OF SYSTEMS: Out of a complete 14 system  review of symptoms, the patient complains only of the following symptoms, heat heat intolerance, tremor, passing out, agitation, depression, nervousness/anxiousness and all other reviewed systems are negative.  ALLERGIES: Allergies  Allergen Reactions  . Artane [Trihexyphenidyl]     Made tremors worse.   . Nsaids Other (See Comments)    Irritates stomach  . Sulfonamide Derivatives Rash    HOME MEDICATIONS: Outpatient Medications Prior to Visit  Medication Sig Dispense Refill  . atorvastatin (LIPITOR) 40 MG tablet TAKE 1 TABLET BY MOUTH  DAILY 90 tablet 2  . carbidopa-levodopa (SINEMET IR) 25-100 MG tablet Three times daily PO before meals. 270 tablet 3  . clonazePAM (KLONOPIN) 1 MG tablet Take 1 mg by mouth 3 (three) times daily.    . diazepam (VALIUM) 10 MG tablet TK 1 T PO BID  1  . fludrocortisone (FLORINEF) 0.1 MG tablet TAKE ONE-HALF TABLET BY  MOUTH TWICE A DAY 30 tablet 11  . Glucagon 3 MG/DOSE POWD Place into the nose.    [name] glucose blood test strip 1 each by Other route. 7 times per day   - as instructed    . HUMALOG KWIKPEN 100 UNIT/ML KwikPen INJECT BEFORE EACH MEAL WITH 1 UNIT PER 10 GM CARBS PLUS CORRECTIN OF 1 UNIT PER 50 MG    . insulin degludec (TRESIBA FLEXTOUCH) 100 UNIT/ML SOPN FlexTouch Pen Inject 24 units at bedtime daily    . LATUDA 80 MG TABS tablet TAKE 1 TABLET BY MOUTH WITH MEAL  0  . pindolol (VISKEN) 5 MG tablet Take 1 tablet (5 mg total) by mouth 2 (two) times daily. 180 tablet 2  . risperidone (RISPERDAL) 4 MG tablet Take 8 mg by mouth at bedtime.  0  . zolpidem (AMBIEN) 10 MG tablet Take 10 mg by mouth at bedtime.  1  . divalproex (DEPAKOTE ER) 500 MG 24 hr tablet Take 1,000 mg by mouth at bedtime.     No facility-administered medications prior to visit.     PAST MEDICAL HISTORY: Past Medical History:  Diagnosis Date  . 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 ([place])    opium  . Addiction, opium ([place])   . Allergy    spring  . Anxiety    ADHD CHRONIC ANXIETY  .  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 Bipolar disorder ([place]) 02/15/2011  . Cervical spondylosis without myelopathy   . Depression   . Depression   . Diabetes mellitus    sees Dr Altheimer/endo  . Dysautonomia ([place]) 10/24/2011  . Dyskinesia     subacute,due to drugs  . Dyslipidemia   . History of nuclear stress test    Myoview 11/16: EF 81%, no ischemia; Low Risk  . Hyperlipidemia   . IBS (irritable bowel syndrome) 02/15/2011  . Insomnia 02/21/2011  . Multinodular thyroid    with large dominant solid nodule with calcifications in the lower left pole benign by FNA  in 1/12 and stable by repeat ulltrasound in 4/13  . Neuropathy    with paresthesias in feet  . Orthostatic hypotension   . Orthostatic hypotension   . Parkinsonism ([place])    due to prolonged prozac/risperdl for yrs/Dr Dohmeir  . Parkinsonism ([place])   . POTS (postural orthostatic tachycardia syndrome)   . Radiculopathy    cervical  . Resting tremor   . Rotator cuff syndrome of left shoulder   . S/P partial hysterectomy   . Spondylosis without myelopathy   . Subacute dyskinesia due to drugs(333.85)    dr dohmeier/neuro  . Tachycardia    syndrome  . Therapy    on therapy by psychiatrist using sebutex   . Thyroid nodule    s/p biopsy - benign approx jan 2012  . Tremor     PAST SURGICAL HISTORY: Past Surgical History:  Procedure Laterality Date  . ACROMIOPLASTY Left 2002   Dr Gladstone Lighter  . BREAST EXCISIONAL BIOPSY Right    benign  . BREAST LUMPECTOMY Right   . herniated disc cervical spine    . lower back  1992  . OOPHORECTOMY    . PARTIAL HYSTERECTOMY    . SHOULDER SURGERY Left   . SPINE SURGERY  feb 2012   c5-6 fusion, Dr Rolena Infante  . THYROID LOBECTOMY  11/11   nodule biopsy, benign    FAMILY HISTORY: Family History  Problem Relation Age of Onset  . Lung cancer Mother   . Heart disease Father   . Hypertension Father   . Pneumonia Father   . Heart disease Paternal Grandmother   . ALS Paternal  Grandmother   . Hypertension Sister     SOCIAL HISTORY: Social History   Socioeconomic History  . Marital status: Married    Spouse name: Not on file  . Number of children: 0  . Years of education: BA  . Highest education level: Not on file  Occupational History  . Occupation: Product manager: [employer]  Social Needs  . Financial resource strain: Not hard at all  . Food insecurity:    Worry: Never true    Inability: Never true  . Transportation needs:    Medical: No    Non-medical: No  Tobacco Use  . Smoking status: Never Smoker  . Smokeless tobacco: Never Used  Substance and Sexual Activity  . Alcohol use: No    Alcohol/week: 0.0 standard drinks  . Drug use: No  . Sexual activity: Never  Lifestyle  . Physical activity:    Days per week: 3 days    Minutes per session: 40 min  . Stress: Not at all  Relationships  . Social connections:    Talks on phone: More than three times a week    Gets together: More than three times a week    Attends religious service: More than 4 times per year    Active member of club or organization: Yes    Attends meetings of clubs or organizations: More than 4 times per year    Relationship status: Married  . Intimate partner violence:    Fear of current or ex partner: No    Emotionally abused: No    Physically abused: No    Forced sexual activity: No  Other Topics Concern  . Not on file  Social History Narrative   Patient is single and lives with my partner ([place]).   Patient is disabled.   Patient is right-handed.   Patient  has a Haematologist.   Patient drinks one soda every other day.      PHYSICAL EXAM  Vitals:   04/07/18 1340  BP: 121/79  Pulse: 87  Weight: 152 lb 12.8 oz (69.3 kg)  Height: 5\' 7"  (1.702 m)   Body mass index is 23.93 kg/m.  Generalized: Well developed, in no acute distress  Cardiology: normal rate and rhythm, no murmur noted Neurological examination  Mentation:  Alert oriented to time, place, history taking. Follows all commands speech and language fluent Cranial nerve II-XII: Pupils were equal round reactive to light. Extraocular movements were full, visual field were full on confrontational test. Facial sensation and strength were normal. Uvula tongue midline. Head turning and shoulder shrug  were normal and symmetric. Motor: The motor testing reveals 5 over 5 strength of bilateral upper extremities.  4/5 of bilateral lower extremities.  Good symmetric motor tone is noted throughout.  No bradykinesia noted today.  She does have tremor of her jaw that is worse with action. Sensory: Sensory testing is intact to soft touch on all 4 extremities. No evidence of extinction is noted.  Coordination: Cerebellar testing reveals good finger-nose-finger bilaterally.  Gait and station: Gait is narrow but stable. Reflexes: Deep tendon reflexes are decreased but symmetric bilaterally in upper and lower extremities,.   DIAGNOSTIC DATA (LABS, IMAGING, TESTING) - I reviewed patient records, labs, notes, testing and imaging myself where available.  MMSE - Mini Mental State Exam 08/21/2014  Orientation to time 5  Orientation to Place 5  Registration 3  Attention/ Calculation 5  Recall 3  Language- name 2 objects 2  Language- repeat 1  Language- follow 3 step command 2  Language- read & follow direction 1  Write a sentence 1  Copy design 1  Total score 29     Lab Results  Component Value Date   WBC 4.8 08/21/2016   HGB 14.5 08/21/2016   HCT 42.0 08/21/2016   MCV 92.6 08/21/2016   PLT 281.0 08/21/2016      Component Value Date/Time   NA 144 01/05/2018 1125   K 3.6 01/05/2018 1125   CL 101 01/05/2018 1125   CO2 30 (H) 01/05/2018 1125   GLUCOSE 352 (H) 01/05/2018 1125   GLUCOSE 144 (H) 07/06/2015 1523   BUN 19 01/05/2018 1125   CREATININE 0.96 01/05/2018 1125   CREATININE 0.87 07/06/2015 1523   CALCIUM 9.3 01/05/2018 1125   PROT 6.0 01/05/2018 1125    ALBUMIN 3.9 01/05/2018 1125   AST 12 01/05/2018 1125   ALT 3 01/05/2018 1125   ALKPHOS 66 01/05/2018 1125   BILITOT 0.4 01/05/2018 1125   GFRNONAA 65 01/05/2018 1125   GFRAA 75 01/05/2018 1125   Lab Results  Component Value Date   CHOL 168 10/26/2017   HDL 57 10/26/2017   LDLCALC 95 10/26/2017   TRIG 81 10/26/2017   CHOLHDL 2.9 10/26/2017   Lab Results  Component Value Date   HGBA1C 9.0 (H) 08/17/2014   Lab Results  Component Value Date   VITAMINB12 944 (H) 08/17/2014   Lab Results  Component Value Date   TSH 1.57 08/21/2016       ASSESSMENT AND PLAN 58 y.o. 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[place]), Bipolar disorder ([place]) (02/15/2011), Cervical spondylosis without myelopathy, Depression, Depression, Diabetes mellitus, Dysautonomia ([place]) (10/24/2011), Dyskinesia, Dyslipidemia, History of nuclear stress test, Hyperlipidemia, IBS (irritable bowel  syndrome) (02/15/2011), Insomnia (02/21/2011), Multinodular thyroid, Neuropathy, Orthostatic hypotension, Orthostatic hypotension, Parkinsonism ([facility]), Parkinsonism ([facility]), POTS (postural orthostatic tachycardia syndrome), Radiculopathy, Resting tremor, Rotator cuff syndrome of left shoulder, S/P partial hysterectomy, Spondylosis without myelopathy, Subacute dyskinesia due to drugs(333.85), Tachycardia, Therapy, Thyroid nodule, and Tremor. here with     ICD-10-CM   1. 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 [facility]) G21.19 Ambulatory referral to Neurology    Overall Lynn Barnes is doing well on Sinemet.  She has noted about a 30% improvement in her symptoms since increasing dose to 3 times a day about a year ago.  She does continue to have concerns with her gait and tremor.  Tremor does seem to be worse with activity.  DaTscan November does not reveal primary Parkinson's disease.  Symptoms most likely related to medications.  She continues close  follow-up with her psychiatrist and feels stable from mental health standpoint.  She is also working closely with cardiology for POTS.  We will continue her current therapy.  I have also discussed the case with Dr. Brett Fairy.  We will refer her at her request to a movement specialist, Dr. Alphonzo Severance.  We will see her back in the office in 6 months pending the visit with Dr. Truddie Crumble.    Orders Placed This Encounter  Procedures  . Ambulatory referral to Neurology    Referral Priority:   Routine    Referral Type:   Consultation    Referral Reason:   Specialty Services Required    Requested Specialty:   Neurology    Number of Visits Requested:   1     No orders of the defined types were placed in this encounter.     I spent 15 minutes with the patient. 50% of this time was spent counseling and educating patient on plan of care and medications.    Debbora Presto, FNP-C 04/07/2018, 2:28 PM [facility] [address], [place], [place] [address] [phone]

## 2018-04-07 ENCOUNTER — Ambulatory Visit: Payer: Medicare Other | Admitting: Family Medicine

## 2018-04-07 ENCOUNTER — Encounter: Payer: Self-pay | Admitting: Family Medicine

## 2018-04-07 VITALS — BP 121/79 | HR 87 | Ht 67.0 in | Wt 152.8 lb

## 2018-04-07 DIAGNOSIS — G2119 Other drug induced secondary parkinsonism: Secondary | ICD-10-CM | POA: Diagnosis not present

## 2018-04-07 NOTE — Patient Instructions (Signed)
   Continue current therapy  Will consider referral to movement specialist for additional therapy.

## 2018-04-30 ENCOUNTER — Ambulatory Visit: Payer: Self-pay | Admitting: Internal Medicine

## 2018-04-30 NOTE — Telephone Encounter (Signed)
Pt. Reports she started having cough, sinus congestion, sore throat 3 days ago. No cough, no fever. Concerned about COVID 19. Low risk per protocol. Pt. Has been taking Zyrtec for allergies. Reviewed home remedies for common cold. Verbalizes understanding. Instructed if symptoms worsen or go beyond 10 days, to call back.Verbalizes understanding.  Reason for Disposition . Cough with cold symptoms (e.g., runny nose, postnasal drip, throat clearing)  Answer Assessment - Initial Assessment Questions 1. ONSET: "When did the cough begin?"      Started 3 days ago 2. SEVERITY: "How bad is the cough today?"      Moderate 3. RESPIRATORY DISTRESS: "Describe your breathing."      No distress 4. FEVER: "Do you have a fever?" If so, ask: "What is your temperature, how was it measured, and when did it start?"     No fever 5. HEMOPTYSIS: "Are you coughing up any blood?" If so ask: "How much?" (flecks, streaks, tablespoons, etc.)     No 6. TREATMENT: "What have you done so far to treat the cough?" (e.g., meds, fluids, humidifier)     Fluids, Zyrtec 7. CARDIAC HISTORY: "Do you have any history of heart disease?" (e.g., heart attack, congestive heart failure)      No 8. LUNG HISTORY: "Do you have any history of lung disease?"  (e.g., pulmonary embolus, asthma, emphysema)     No 9. PE RISK FACTORS: "Do you have a history of blood clots?" (or: recent major surgery, recent prolonged travel, bedridden)     No 10. OTHER SYMPTOMS: "Do you have any other symptoms? (e.g., runny nose, wheezing, chest pain)       Runny nose 11. PREGNANCY: "Is there any chance you are pregnant?" "When was your last menstrual period?"       No 12. TRAVEL: "Have you traveled out of the country in the last month?" (e.g., travel history, exposures)       No  Protocols used: COUGH - ACUTE NON-PRODUCTIVE-A-AH

## 2018-05-04 ENCOUNTER — Ambulatory Visit: Payer: Self-pay

## 2018-05-04 NOTE — Telephone Encounter (Signed)
Noted  

## 2018-05-04 NOTE — Telephone Encounter (Signed)
Pt. Called to report 5 day hx. Of cough, runny nose, sinus pressure, and sore throat.  Reported some redness in throat.  Denied fever, shortness of breath, or chest tightness.  Stated the cough is mild during day, and worse at night.  Denied wheezing.  Reported green mucus drainage from nasal passages.  Reported sinus pressure in bilat. Cheek region.  Denied any recent travel or known exposure to COVID 19, over past 14 days.    Called FC at [facility] [place] office.  Was advised that pt. Will need to go to an UC or ER for evaluation of symptoms.  Pt. Given recommendations to go to UC or ER.  Care advice given per protocol.  Verb. Understanding.  Agrees with plan.     Reason for Disposition . [1] Continuous (nonstop) coughing interferes with work or school AND [2] no improvement using cough treatment per Care Advice    Stated cough is mild during day and severe at night. Also c/o sinus pressure and green drainage from nasal passages.  Answer Assessment - Initial Assessment Questions 1. ONSET: "When did the cough begin?"      About 5 days ago  2. SEVERITY: "How bad is the cough today?"      Mild during day; can be severe at night 3. RESPIRATORY DISTRESS: "Describe your breathing."      Denied 4. FEVER: "Do you have a fever?" If so, ask: "What is your temperature, how was it measured, and when did it start?"     Denied  5. SPUTUM: "Describe the color of your sputum" (clear, white, yellow, green)     Unable to say; "goes back down 6. HEMOPTYSIS: "Are you coughing up any blood?" If so ask: "How much?" (flecks, streaks, tablespoons, etc.)     denied 7. CARDIAC HISTORY: "Do you have any history of heart disease?" (e.g., heart attack, congestive heart failure)      denied 8. LUNG HISTORY: "Do you have any history of lung disease?"  (e.g., pulmonary embolus, asthma, emphysema)     Denied  9. PE RISK FACTORS: "Do you have a history of blood clots?" (or: recent major surgery, recent prolonged travel,  bedridden)     N/a  10. OTHER SYMPTOMS: "Do you have any other symptoms?" (e.g., runny nose, wheezing, chest pain)       Denied chest symptoms; denied wheezing, c/o runny nose with green mucus, sore throat, some redness in throat, sinus pressure in cheeks  11. PREGNANCY: "Is there any chance you are pregnant?" "When was your last menstrual period?"       N/a  12. TRAVEL: "Have you traveled out of the country in the last month?" (e.g., travel history, exposures)       Denied travel or exposure to COVID 19  Protocols used: [place]

## 2018-05-10 NOTE — Telephone Encounter (Signed)
ERROR

## 2018-05-13 ENCOUNTER — Other Ambulatory Visit: Payer: Self-pay | Admitting: Adult Health

## 2018-07-09 ENCOUNTER — Telehealth: Payer: Self-pay | Admitting: Neurology

## 2018-07-09 NOTE — Telephone Encounter (Signed)
Pt called stating she would like to get off of her carbidopa-levodopa (SINEMET IR) 25-100 MG tablet Please advise.

## 2018-07-12 NOTE — Telephone Encounter (Signed)
Called the patient and discussed with her that Amy reviewed her chart and thought that at last visit it was discussed that she was doing well with the medication. Asked the patient if she was in fact wanting to come off medication. She states that she feels like it isnt benefitting and at this point feels like she is over medicating and would like to attempt coming off. Advised that she would need to wean off the medication. Advised to take 2 tab daily for 2 wks and then decrease to 1 tablet daily for 2 wks. Informed the patient that Amy would like for the patient to call and let us know how she is doing and if she is tolerating the wean. If so we could stop medication. Pt verbalized understanding and will call us when she is finishing up.

## 2018-07-12 NOTE — Telephone Encounter (Signed)
At her last appt she reported improvement of tremor and gait with medication. If she is wanting to stop, we need to wean medication. I would have her take two tablets daily for 2 weeks then one tablet daily for 2 weeks and then have her call us. If she is doing well, we will stop medication.

## 2018-08-03 ENCOUNTER — Telehealth: Payer: Self-pay | Admitting: Neurology

## 2018-08-03 NOTE — Telephone Encounter (Signed)
Noted, we will continue to refill for the patient when needed.

## 2018-08-03 NOTE — Telephone Encounter (Signed)
Pt is calling in and wanted to advise Dr Dohmeier she has decided to stay on the carbidopa-levodopa (SINEMET IR) 25-100 MG tablet

## 2018-08-27 ENCOUNTER — Encounter: Payer: Medicare Other | Admitting: Internal Medicine

## 2018-09-01 ENCOUNTER — Encounter: Payer: Medicare Other | Admitting: Internal Medicine

## 2018-09-21 ENCOUNTER — Ambulatory Visit (INDEPENDENT_AMBULATORY_CARE_PROVIDER_SITE_OTHER): Payer: Medicare Other | Admitting: Internal Medicine

## 2018-09-21 ENCOUNTER — Encounter: Payer: Self-pay | Admitting: Internal Medicine

## 2018-09-21 ENCOUNTER — Other Ambulatory Visit: Payer: Self-pay

## 2018-09-21 ENCOUNTER — Other Ambulatory Visit: Payer: Medicare Other

## 2018-09-21 ENCOUNTER — Other Ambulatory Visit (INDEPENDENT_AMBULATORY_CARE_PROVIDER_SITE_OTHER): Payer: Medicare Other

## 2018-09-21 VITALS — BP 118/76 | HR 96 | Temp 98.5°F | Ht 67.0 in | Wt 143.0 lb

## 2018-09-21 DIAGNOSIS — M542 Cervicalgia: Secondary | ICD-10-CM

## 2018-09-21 DIAGNOSIS — Z0000 Encounter for general adult medical examination without abnormal findings: Secondary | ICD-10-CM

## 2018-09-21 DIAGNOSIS — E1042 Type 1 diabetes mellitus with diabetic polyneuropathy: Secondary | ICD-10-CM

## 2018-09-21 DIAGNOSIS — E559 Vitamin D deficiency, unspecified: Secondary | ICD-10-CM

## 2018-09-21 DIAGNOSIS — E538 Deficiency of other specified B group vitamins: Secondary | ICD-10-CM

## 2018-09-21 DIAGNOSIS — E611 Iron deficiency: Secondary | ICD-10-CM | POA: Diagnosis not present

## 2018-09-21 LAB — TSH: TSH: 1.43 u[IU]/mL (ref 0.35–4.50)

## 2018-09-21 LAB — BASIC METABOLIC PANEL
BUN: 24 mg/dL — ABNORMAL HIGH (ref 6–23)
CO2: 30 mEq/L (ref 19–32)
Calcium: 9.8 mg/dL (ref 8.4–10.5)
Chloride: 103 mEq/L (ref 96–112)
Creatinine, Ser: 0.91 mg/dL (ref 0.40–1.20)
GFR: 63.29 mL/min (ref >60.00)
Glucose, Bld: 275 mg/dL — ABNORMAL HIGH (ref 70–99)
Potassium: 4.2 mEq/L (ref 3.5–5.1)
Sodium: 141 mEq/L (ref 135–145)

## 2018-09-21 LAB — HEPATIC FUNCTION PANEL
ALT: 11 U/L (ref 0–35)
AST: 13 U/L (ref 0–37)
Albumin: 3.9 g/dL (ref 3.5–5.2)
Alkaline Phosphatase: 63 U/L (ref 39–117)
Bilirubin, Direct: 0.1 mg/dL (ref 0.0–0.3)
Total Bilirubin: 0.5 mg/dL (ref 0.2–1.2)
Total Protein: 6.3 g/dL (ref 6.0–8.3)

## 2018-09-21 LAB — LIPID PANEL
Cholesterol: 169 mg/dL (ref 0–200)
HDL: 56.4 mg/dL (ref >39.00)
LDL Cholesterol: 99 mg/dL (ref 0–99)
NonHDL: 112.36
Total CHOL/HDL Ratio: 3
Triglycerides: 67 mg/dL (ref 0.0–149.0)
VLDL: 13.4 mg/dL (ref 0.0–40.0)

## 2018-09-21 LAB — URINALYSIS, ROUTINE W REFLEX MICROSCOPIC
Bilirubin Urine: NEGATIVE
Hgb urine dipstick: NEGATIVE
Leukocytes,Ua: NEGATIVE
Nitrite: NEGATIVE
RBC / HPF: NONE SEEN (ref 0–?)
Specific Gravity, Urine: 1.02 (ref 1.000–1.030)
Total Protein, Urine: NEGATIVE
Urine Glucose: NEGATIVE
Urobilinogen, UA: 0.2 (ref 0.0–1.0)
pH: 6 (ref 5.0–8.0)

## 2018-09-21 LAB — CBC WITH DIFFERENTIAL/PLATELET
Basophils Absolute: 0 10*3/uL (ref 0.0–0.1)
Basophils Relative: 0.5 % (ref 0.0–3.0)
Eosinophils Absolute: 0.1 10*3/uL (ref 0.0–0.7)
Eosinophils Relative: 0.8 % (ref 0.0–5.0)
HCT: 39.4 % (ref 36.0–46.0)
Hemoglobin: 13.4 g/dL (ref 12.0–15.0)
Lymphocytes Relative: 19.7 % (ref 12.0–46.0)
Lymphs Abs: 1.4 10*3/uL (ref 0.7–4.0)
MCHC: 34 g/dL (ref 30.0–36.0)
MCV: 94.1 fl (ref 78.0–100.0)
Monocytes Absolute: 0.4 10*3/uL (ref 0.1–1.0)
Monocytes Relative: 6.3 % (ref 3.0–12.0)
Neutro Abs: 5.1 10*3/uL (ref 1.4–7.7)
Neutrophils Relative %: 72.7 % (ref 43.0–77.0)
Platelets: 284 10*3/uL (ref 150.0–400.0)
RBC: 4.19 Mil/uL (ref 3.87–5.11)
RDW: 12.7 % (ref 11.5–15.5)
WBC: 7 10*3/uL (ref 4.0–10.5)

## 2018-09-21 LAB — MICROALBUMIN / CREATININE URINE RATIO
Creatinine,U: 105.8 mg/dL
Microalb Creat Ratio: 2.3 mg/g (ref 0.0–30.0)
Microalb, Ur: 2.5 mg/dL — ABNORMAL HIGH (ref 0.0–1.9)

## 2018-09-21 LAB — IBC PANEL
Iron: 111 ug/dL (ref 42–145)
Saturation Ratios: 40 % (ref 20.0–50.0)
Transferrin: 198 mg/dL — ABNORMAL LOW (ref 212.0–360.0)

## 2018-09-21 LAB — VITAMIN D 25 HYDROXY (VIT D DEFICIENCY, FRACTURES): VITD: 72.12 ng/mL (ref 30.00–100.00)

## 2018-09-21 LAB — HEMOGLOBIN A1C: Hgb A1c MFr Bld: 8.3 % — ABNORMAL HIGH (ref 4.6–6.5)

## 2018-09-21 LAB — VITAMIN B12: Vitamin B-12: 492 pg/mL (ref 211–911)

## 2018-09-21 MED ORDER — MELOXICAM 15 MG PO TABS: ORAL_TABLET | ORAL | 3 refills | Status: DC

## 2018-09-21 NOTE — Progress Notes (Signed)
Subjective:    Patient ID: Lynn Barnes, female    DOB: [birth year], 59 y.o.   MRN: [id number]  HPI  Here for wellness and f/u;  Overall doing ok;  Pt denies Chest pain, worsening SOB, DOE, wheezing, orthopnea, PND, worsening LE edema, palpitations, dizziness or syncope.  Pt denies neurological change such as new headache, facial or extremity weakness.  Pt denies polydipsia, polyuria, or low sugar symptoms. Pt states overall good compliance with treatment and medications, good tolerability, and has been trying to follow appropriate diet.  Pt denies worsening depressive symptoms, suicidal ideation or panic. No fever, night sweats, wt loss, loss of appetite, or other constitutional symptoms.  Pt states good ability with ADL's, has low fall risk, home safety reviewed and adequate, no other significant changes in hearing or vision, and only occasionally active with exercise. Wanting now to do the cologuard, insurance will pay per pt.  Still with orthostatic symptoms  But no falls or syncope.  No other new complaints except does have some recurring post neck pain only in the shower with looking up, but happens each time Past Medical History:  Diagnosis Date  . 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 ([place])    opium  . Addiction, opium ([place])   . Allergy    spring  . Anxiety    ADHD CHRONIC ANXIETY  . 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 Bipolar disorder ([place]) 02/15/2011  . Cervical spondylosis without myelopathy   . Depression   . Depression   . Diabetes mellitus    sees Dr Altheimer/endo  . Dysautonomia ([place]) 10/24/2011  . Dyskinesia     subacute,due to drugs  . Dyslipidemia   . History of nuclear stress test    Myoview 11/16: EF 81%, no ischemia; Low Risk  . Hyperlipidemia   . IBS (irritable bowel syndrome) 02/15/2011  . Insomnia 02/21/2011  . Multinodular thyroid    with large dominant solid nodule with calcifications in the lower left pole benign by FNA in 1/12 and stable by repeat ulltrasound in 4/13   . Neuropathy    with paresthesias in feet  . Orthostatic hypotension   . Orthostatic hypotension   . 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 Parkinsonism ([place])   . POTS (postural orthostatic tachycardia syndrome)   . Radiculopathy    cervical  . Resting tremor   . Rotator cuff syndrome of left shoulder   . S/P partial hysterectomy   . Spondylosis without myelopathy   . Subacute dyskinesia due to drugs(333.85)    dr dohmeier/neuro  . Tachycardia    syndrome  . Therapy    on therapy by psychiatrist using sebutex   . Thyroid nodule    s/p biopsy - benign approx jan 2012  . Tremor    Past Surgical History:  Procedure Laterality Date  . ACROMIOPLASTY Left 2002   Dr Gladstone Lighter  . BREAST EXCISIONAL BIOPSY Right    benign  . BREAST LUMPECTOMY Right   . herniated disc cervical spine    . lower back  1992  . OOPHORECTOMY    . PARTIAL HYSTERECTOMY    . SHOULDER SURGERY Left   . SPINE SURGERY  feb 2012   c5-6 fusion, Dr Rolena Infante  . THYROID LOBECTOMY  11/11   nodule biopsy, benign    reports that she has never smoked. She has never used smokeless tobacco. She reports that she does not drink alcohol or use drugs. family  history includes ALS in her paternal grandmother; Heart disease in her father and paternal grandmother; Hypertension in her father and sister; Lung cancer in her mother; Pneumonia in her father. Allergies  Allergen Reactions  . Artane [Trihexyphenidyl]     Made tremors worse.   . Nsaids Other (See Comments)    Irritates stomach  . Sulfonamide Derivatives Rash   Current Outpatient Medications on File Prior to Visit  Medication Sig Dispense Refill  . atorvastatin (LIPITOR) 40 MG tablet TAKE 1 TABLET BY MOUTH  DAILY 90 tablet 2  . carbidopa-levodopa (SINEMET IR) 25-100 MG tablet TAKE 1 TABLET BY MOUTH 3  TIMES A DAY BEFORE MEALS 270 tablet 3  . clonazePAM (KLONOPIN) 1 MG tablet Take 1 mg by mouth 3 (three) times daily.    .  diazepam (VALIUM) 10 MG tablet TK 1 T PO BID  1  . divalproex (DEPAKOTE ER) 500 MG 24 hr tablet Take 1,000 mg by mouth at bedtime.    . fludrocortisone (FLORINEF) 0.1 MG tablet TAKE ONE-HALF TABLET BY  MOUTH TWICE A DAY 30 tablet 11  . Glucagon 3 MG/DOSE POWD Place into the nose.    [name] glucose blood test strip 1 each by Other route. 7 times per day   - as instructed    . HUMALOG KWIKPEN 100 UNIT/ML KwikPen INJECT BEFORE EACH MEAL WITH 1 UNIT PER 10 GM CARBS PLUS CORRECTIN OF 1 UNIT PER 50 MG    . insulin degludec (TRESIBA FLEXTOUCH) 100 UNIT/ML SOPN FlexTouch Pen Inject 24 units at bedtime daily    . LATUDA 80 MG TABS tablet TAKE 1 TABLET BY MOUTH WITH MEAL  0  . pindolol (VISKEN) 5 MG tablet Take 1 tablet (5 mg total) by mouth 2 (two) times daily. 180 tablet 2  . risperidone (RISPERDAL) 4 MG tablet Take 8 mg by mouth at bedtime.  0  . zolpidem (AMBIEN) 10 MG tablet Take 10 mg by mouth at bedtime.  1   No current facility-administered medications on file prior to visit.    Review of Systems .Constitutional: Negative for other unusual diaphoresis, sweats, appetite or weight changes HENT: Negative for other worsening hearing loss, ear pain, facial swelling, mouth sores or neck stiffness.   Eyes: Negative for other worsening pain, redness or other visual disturbance.  Respiratory: Negative for other stridor or swelling Cardiovascular: Negative for other palpitations or other chest pain  Gastrointestinal: Negative for worsening diarrhea or loose stools, blood in stool, distention or other pain Genitourinary: Negative for hematuria, flank pain or other change in urine volume.  Musculoskeletal: Negative for myalgias or other joint swelling.  Skin: Negative for other color change, or other wound or worsening drainage.  Neurological: Negative for other syncope or numbness. Hematological: Negative for other adenopathy or swelling Psychiatric/Behavioral: Negative for hallucinations, other worsening  agitation, SI, self-injury, or new decreased concentration All other system neg per pt    Objective:   Physical Exam BP 118/76   Pulse 96   Temp 98.5 F (36.9 C) (Oral)   Ht 5\' 7"  (1.702 m)   Wt 143 lb (64.9 kg)   SpO2 99%   BMI 22.40 kg/m  VS noted,  Constitutional: Pt is oriented to person, place, and time. Appears well-developed and well-nourished, in no significant distress and comfortable Head: Normocephalic and atraumatic  Eyes: Conjunctivae and EOM are normal. Pupils are equal, round, and reactive to light Right Ear: External ear normal without discharge Left Ear: External ear normal without discharge Nose:  Nose without discharge or deformity Mouth/Throat: Oropharynx is without other ulcerations and moist  Neck: Normal range of motion. Neck supple. No JVD present. No tracheal deviation present or significant neck LA or mass Cardiovascular: Normal rate, regular rhythm, normal heart sounds and intact distal pulses.   Pulmonary/Chest: WOB normal and breath sounds without rales or wheezing  Abdominal: Soft. Bowel sounds are normal. NT. No HSM  Musculoskeletal: Normal range of motion. Exhibits no edema Lymphadenopathy: Has no other cervical adenopathy.  Neurological: Pt is alert and oriented to person, place, and time. Pt has normal reflexes. No cranial nerve deficit. Motor grossly intact, Gait intact Skin: Skin is warm and dry. No rash noted or new ulcerations Psychiatric:  Has normal mood and affect. Behavior is normal without agitation No other exam findings Lab Results  Component Value Date   WBC 7.0 09/21/2018   HGB 13.4 09/21/2018   HCT 39.4 09/21/2018   PLT 284.0 09/21/2018   GLUCOSE 275 (H) 09/21/2018   CHOL 169 09/21/2018   TRIG 67.0 09/21/2018   HDL 56.40 09/21/2018   LDLCALC 99 09/21/2018   ALT 11 09/21/2018   AST 13 09/21/2018   NA 141 09/21/2018   K 4.2 09/21/2018   CL 103 09/21/2018   CREATININE 0.91 09/21/2018   BUN 24 (H) 09/21/2018   CO2 30  09/21/2018   TSH 1.43 09/21/2018   HGBA1C 8.3 (H) 09/21/2018   MICROALBUR 2.5 (H) 09/21/2018      Assessment & Plan:

## 2018-09-21 NOTE — Patient Instructions (Addendum)
You should receive the cologuard testing kit at home soon  Please take all new medication as prescribed - the mobic as needed for pain  Please continue all other medications as before, and refills have been done if requested.  Please have the pharmacy call with any other refills you may need.  Please continue your efforts at being more active, low cholesterol diet, and weight control.  You are otherwise up to date with prevention measures today.  Please keep your appointments with your specialists as you may have planned  Please go to the LAB in the Basement (turn left off the elevator) for the tests to be done today  You will be contacted by phone if any changes need to be made immediately.  Otherwise, you will receive a letter about your results with an explanation, but please check with MyChart first.  Please remember to sign up for MyChart if you have not done so, as this will be important to you in the future with finding out test results, communicating by private email, and scheduling acute appointments online when needed.  Please return in 1 year for your yearly visit, or sooner if needed

## 2018-09-23 ENCOUNTER — Encounter: Payer: Self-pay | Admitting: Internal Medicine

## 2018-09-23 DIAGNOSIS — M542 Cervicalgia: Secondary | ICD-10-CM | POA: Insufficient documentation

## 2018-09-23 NOTE — Assessment & Plan Note (Signed)
For a1c, f/u endo as planned

## 2018-09-23 NOTE — Assessment & Plan Note (Signed)
Mild, for mobic prn,  to f/u any worsening symptoms or concerns

## 2018-09-23 NOTE — Assessment & Plan Note (Signed)

## 2018-09-24 ENCOUNTER — Telehealth: Payer: Self-pay | Admitting: Internal Medicine

## 2018-09-24 MED ORDER — ATORVASTATIN CALCIUM 40 MG PO TABS: 40.0000 mg | ORAL_TABLET | Freq: Every day | ORAL | 1 refills | Status: DC

## 2018-09-24 NOTE — Telephone Encounter (Signed)
Medication Refill - Medication: atorvastatin (LIPITOR) 40 MG tablet    Preferred Pharmacy (with phone number or street name): [id number] - [place], [place] AT [facility] [address] [place] [phone] (Phone) [phone] (Fax)

## 2018-09-25 ENCOUNTER — Other Ambulatory Visit: Payer: Self-pay | Admitting: Internal Medicine

## 2018-09-29 NOTE — Progress Notes (Signed)
Cardiology Office Note   Date:  09/30/2018   ID:  Lynn Barnes, Lynn Barnes Nov 01, 1960, MRN [id number]  PCP:  Biagio Borg, MD  Cardiologist:   Dorris Carnes, MD    Pt returns for f/u of autonomic dysfunction.   History of Present Illness: Lynn Barnes is a 58 y.o. female with a history of parkinsons and autonomic dysfunciton   I saw her in  Fall 2019  Since seen she says she has actually done fairly well   Has about 2 to 5 spells of dizziness per month  No syncope Appetite is good  BOwels OK   No SOB   Current Meds  Medication Sig  . atorvastatin (LIPITOR) 40 MG tablet Take 1 tablet (40 mg total) by mouth daily.  . carbidopa-levodopa (SINEMET IR) 25-100 MG tablet TAKE 1 TABLET BY MOUTH 3  TIMES A DAY BEFORE MEALS  . clonazePAM (KLONOPIN) 1 MG tablet Take 1 mg by mouth 3 (three) times daily.  . fludrocortisone (FLORINEF) 0.1 MG tablet TAKE ONE-HALF TABLET BY  MOUTH TWICE A DAY  . Glucagon 3 MG/DOSE POWD Place into the nose.  [name] glucose blood test strip 1 each by Other route. 7 times per day   - as instructed  . HUMALOG KWIKPEN 100 UNIT/ML KwikPen INJECT BEFORE EACH MEAL WITH 1 UNIT PER 10 GM CARBS PLUS CORRECTIN OF 1 UNIT PER 50 MG  . insulin degludec (TRESIBA FLEXTOUCH) 100 UNIT/ML SOPN FlexTouch Pen Inject 24 units at bedtime daily  . LATUDA 80 MG TABS tablet Take 120 mg by mouth daily.   . meloxicam (MOBIC) 15 MG tablet 1 tab by mouth daily as needed for mild to mod pain  . pindolol (VISKEN) 5 MG tablet TAKE 1 TABLET BY MOUTH TWO  TIMES DAILY  . risperidone (RISPERDAL) 4 MG tablet Take 8 mg by mouth at bedtime.  [name] zolpidem (AMBIEN) 10 MG tablet Take 10 mg by mouth at bedtime.     Allergies:   Artane [trihexyphenidyl], Nsaids, and Sulfonamide derivatives   Past Medical History:  Diagnosis Date  . 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 ([place])    opium  . Addiction, opium ([place])   . Allergy    spring  . Anxiety    ADHD CHRONIC ANXIETY  . 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 Bipolar  disorder ([place]) 02/15/2011  . Cervical spondylosis without myelopathy   . Depression   . Depression   . Diabetes mellitus    sees Dr Altheimer/endo  . Dysautonomia ([place]) 10/24/2011  . Dyskinesia     subacute,due to drugs  . Dyslipidemia   . History of nuclear stress test    Myoview 11/16: EF 81%, no ischemia; Low Risk  . Hyperlipidemia   . IBS (irritable bowel syndrome) 02/15/2011  . Insomnia 02/21/2011  . Multinodular thyroid    with large dominant solid nodule with calcifications in the lower left pole benign by FNA in 1/12 and stable by repeat ulltrasound in 4/13  . Neuropathy    with paresthesias in feet  . Orthostatic hypotension   . Orthostatic hypotension   . 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 Parkinsonism ([place])   . POTS (postural orthostatic tachycardia syndrome)   . Radiculopathy    cervical  . Resting tremor   . Rotator cuff syndrome of left shoulder   . S/P partial hysterectomy   . Spondylosis without myelopathy   . Subacute dyskinesia due to drugs(333.85)  dr dohmeier/neuro  . Tachycardia    syndrome  . Therapy    on therapy by psychiatrist using sebutex   . Thyroid nodule    s/p biopsy - benign approx jan 2012  . Tremor     Past Surgical History:  Procedure Laterality Date  . ACROMIOPLASTY Left 2002   Dr Gladstone Lighter  . BREAST EXCISIONAL BIOPSY Right    benign  . BREAST LUMPECTOMY Right   . herniated disc cervical spine    . lower back  1992  . OOPHORECTOMY    . PARTIAL HYSTERECTOMY    . SHOULDER SURGERY Left   . SPINE SURGERY  feb 2012   c5-6 fusion, Dr Rolena Infante  . THYROID LOBECTOMY  11/11   nodule biopsy, benign     Social History:  The patient  reports that she has never smoked. She has never used smokeless tobacco. She reports that she does not drink alcohol or use drugs.   Family History:  The patient's family history includes ALS in her paternal grandmother; Heart disease in her father and paternal  grandmother; Hypertension in her father and sister; Lung cancer in her mother; Pneumonia in her father.    ROS:  Please see the history of present illness. All other systems are reviewed and  Negative to the above problem except as noted.    PHYSICAL EXAM: VS:  BP 120/83   Pulse 96   Ht 5\' 7"  (1.702 m)   Wt 147 lb (66.7 kg)   BMI 23.02 kg/m   GEN: Well nourished, well developed, in no acute distress  HEENT: normal  Neck: JVP is normal  No  carotid bruits, or masses Cardiac: RRR; no murmurs, rubs, or gallops,no edema  Respiratory:  clear to auscultation bilaterally, normal work of breathing GI: soft, nontender, nondistended, + BS  No hepatomegaly  MS: no deformity Moving all extremities   Skin: warm and dry, no rash Neuro:  Strength and sensation are intact Psych: euthymic mood, full affect   EKG:  EKG is not ordered today.    Lipid Panel    Component Value Date/Time   CHOL 169 09/21/2018 1414   CHOL 168 10/26/2017 1347   TRIG 67.0 09/21/2018 1414   HDL 56.40 09/21/2018 1414   HDL 57 10/26/2017 1347   CHOLHDL 3 09/21/2018 1414   VLDL 13.4 09/21/2018 1414   LDLCALC 99 09/21/2018 1414   LDLCALC 95 10/26/2017 1347      Wt Readings from Last 3 Encounters:  09/30/18 147 lb (66.7 kg)  09/21/18 143 lb (64.9 kg)  04/07/18 152 lb 12.8 oz (69.3 kg)      ASSESSMENT AND PLAN:  1  Autonomic dysfunction  Doing fairly well  Keep on same regimen   Labs OK   2  Dyslipidemia  On lipitor  Lipid panel shows LDL 99  HDL 56    3  Parkinson's   Continue on Sinemet   Stay active  Stay hydrated   Keep socially distanced  Current medicines are reviewed at length with the patient today.  The patient does not have concerns regarding medicines.  Signed, Dorris Carnes, MD  09/30/2018 3:07 PM    [facility] [place], [place],   [address] Phone: [phone]; Fax: [phone]

## 2018-09-30 ENCOUNTER — Ambulatory Visit (INDEPENDENT_AMBULATORY_CARE_PROVIDER_SITE_OTHER): Payer: Medicare Other | Admitting: Internal Medicine

## 2018-09-30 ENCOUNTER — Encounter: Payer: Self-pay | Admitting: Internal Medicine

## 2018-09-30 ENCOUNTER — Other Ambulatory Visit: Payer: Self-pay

## 2018-09-30 VITALS — BP 120/83 | HR 96 | Ht 67.0 in | Wt 147.0 lb

## 2018-09-30 DIAGNOSIS — R000 Tachycardia, unspecified: Secondary | ICD-10-CM | POA: Diagnosis not present

## 2018-09-30 DIAGNOSIS — I951 Orthostatic hypotension: Secondary | ICD-10-CM

## 2018-09-30 DIAGNOSIS — E782 Mixed hyperlipidemia: Secondary | ICD-10-CM

## 2018-09-30 DIAGNOSIS — G90A Postural orthostatic tachycardia syndrome (POTS): Secondary | ICD-10-CM

## 2018-09-30 NOTE — Patient Instructions (Signed)
Medication Instructions:  No changes If you need a refill on your cardiac medications before your next appointment, please call your pharmacy.   Lab work: none If you have labs (blood work) drawn today and your tests are completely normal, you will receive your results only by: . MyChart Message (if you have MyChart) OR . A paper copy in the mail If you have any lab test that is abnormal or we need to change your treatment, we will call you to review the results.  Testing/Procedures: none  Follow-Up: At [facility], you and your health needs are our priority.  As part of our continuing mission to provide you with exceptional heart care, we have created designated Provider Care Teams.  These Care Teams include your primary Cardiologist (physician) and Advanced Practice Providers (APPs -  Physician Assistants and Nurse Practitioners) who all work together to provide you with the care you need, when you need it. You will need a follow up appointment in:  12 months.  Please call our office 2 months in advance to schedule this appointment.  You may see Paula Ross, MD or one of the following Advanced Practice Providers on your designated Care Team: Scott Weaver, PA-C Vin Bhagat, PA-C . Janine Hammond, NP  Any Other Special Instructions Will Be Listed Below (If Applicable).    

## 2018-10-06 ENCOUNTER — Encounter: Payer: Self-pay | Admitting: Neurology

## 2018-10-06 ENCOUNTER — Other Ambulatory Visit: Payer: Self-pay

## 2018-10-06 ENCOUNTER — Ambulatory Visit (INDEPENDENT_AMBULATORY_CARE_PROVIDER_SITE_OTHER): Payer: Medicare Other | Admitting: Neurology

## 2018-10-06 VITALS — BP 147/87 | HR 80 | Temp 98.0°F | Ht 67.0 in | Wt 147.0 lb

## 2018-10-06 DIAGNOSIS — G903 Multi-system degeneration of the autonomic nervous system: Secondary | ICD-10-CM

## 2018-10-06 DIAGNOSIS — G20 Parkinson's disease: Secondary | ICD-10-CM

## 2018-10-06 MED ORDER — CARBIDOPA-LEVODOPA 25-100 MG PO TABS: ORAL_TABLET | ORAL | 3 refills | Status: DC

## 2018-10-06 NOTE — Patient Instructions (Signed)

## 2018-10-06 NOTE — Progress Notes (Signed)
PATIENT: Lynn Barnes DOB: 12/07/1960  REASON FOR VISIT: follow up HISTORY FROM: patient   Chief Complaint  Patient presents with   Follow-up    pt alone,rm 10. pt states things are well, no concerns.     HISTORY OF PRESENT ILLNESS:  10/06/18 Rv with Lynn Seat, MD, routine Rv.  No further falls, no panic attacks, anxiety reportedly well controlled.   DM type 1 childhood onset- with a dysautonomia and neuropathy. POTS followed by Lynn Carnes, MD  Tremors have responded very well to Sinemet( start in 2017)  and after she took a "break' from carbidopa levodopa she noted how severe the tremor was when untreated.  12-2017 DAT scan negative for primary parkinson's disease.   2-20202 Lynn Presto, NP  AHLANNA Barnes is a 58 y.o. 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  She was started on Sinemet about 2 years ago.  Sinemet helps but she still have some trouble walking and a tremor. She feels tremor is worse with activity like writing or drinking out of a cup.  She is able to perform all ADLs.  She does feel that she still has some trouble walking.  She has not had any falls.  She does not use any assistive devices.  DaTscan in November did not reveal any primary Parkinson's disease.  She is seen every three months by her psychiatrist, Dr Lynn Barnes.  She continues Latuda, Risperdal, Valium and Ambien prescribed.  She reports that she continues to struggle with some anxiety but feels that she is stable overall.  She is seeing Lynn Barnes for POTS and reports that symptoms are controlled. She is taking Pindolol 5mg  BID.    HISTORY: (copied from Dr Lynn Barnes's note on 01/05/2018) RV 01-05-2018, follow up after 6 month visit with NP, last seen May 2019 by Lynn Givens, NP.  Patient repots she is doing poorly, her legs have become very weak, shuffling gait and the feeling of " heavy -limbs".  " I can't walk more than 100 m without having to break". No  SOB.  She feels her balance and fine motor skills are affected by tremor. "Penmanship has declined. Can't apply make up".  No falls over 6 month, but several near misses.    HISTORY OF PRESENT ILLNESS:  I have the pleasure of meeting today again with Lynn Barnes, meanwhile 58 year old patient, I had placed on carbidopa levodopa after she presented in February with mouth, jaw and tongue trembling and tremors.  She feels that this has been beneficial .  She is calmer without any obvious tremor today she still has vocal cord Trembling.  She also reports a greater fluidity of movement.   She had no further POTs related problems, I had always suspected that her dysautonomia was related to her juvenile diabetes long-standing insulin dependence.   However, I would not expect dysautonomia to get better on Sinemet. Lynn Barnes ?    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 She returns today for follow-up. She was started on Sinemet at the last visit. She reports this has been beneficial. She states that her tremor has improved. Reports that her gait and balance have remained the same. Denies any falls. She does report decreased appetite and has lost weight since her last visit in February. She does report some trouble falling asleep. She reports that her tremor is primarily  in the jaw and tongue. Denies any new neurological symptoms. He returns today for an evaluation.  HISTORY 04/02/16 Dr. Brett Barnes: "there are times when my mouth, jaw and tongue trembling". Reglan induced? Mrs. Lynn Barnes has not used Reglan in the past., Her depression however has been treated with Geodon and Risperdal.Neuroleptic induced? [name]  She has been evaluated for Parkinsonism secondary induced due to medication here before, and one of her concerns is memory loss, the feeling of not tracking thought process all the way through.  I evaluated her memory last 8  year ago in Barnes 2017 when she scored 26 out of 30 points on a [place] cognitive assessment. Today she scored 25 out of 30 and I will print those results for comparison below.  I do not think that Mrs. Lynn Barnes has early onset dementia but maybe a cognitive disorder. She should take prenatal vitamins and I will try Artane for tremor.   REVIEW OF SYSTEMS: Out of a complete 14 system review of symptoms, the patient complains only of the following symptoms, heat heat intolerance, tremor, passing out, agitation, depression, nervousness/anxiousness and all other reviewed systems are negative.  ALLERGIES: Allergies  Allergen Reactions   Artane [Trihexyphenidyl]     Made tremors worse.    Nsaids Other (See Comments)    Irritates stomach   Sulfonamide Derivatives Rash    HOME MEDICATIONS: Outpatient Medications Prior to Visit  Medication Sig Dispense Refill   atorvastatin (LIPITOR) 40 MG tablet Take 1 tablet (40 mg total) by mouth daily. 90 tablet 1   carbidopa-levodopa (SINEMET IR) 25-100 MG tablet TAKE 1 TABLET BY MOUTH 3  TIMES A DAY BEFORE MEALS 270 tablet 3   clonazePAM (KLONOPIN) 1 MG tablet Take 1 mg by mouth 3 (three) times daily.     fludrocortisone (FLORINEF) 0.1 MG tablet TAKE ONE-HALF TABLET BY  MOUTH TWICE A DAY 30 tablet 11   Glucagon 3 MG/DOSE POWD Place into the nose.     glucose blood test strip 1 each by Other route. 7 times per day   - as instructed     HUMALOG KWIKPEN 100 UNIT/ML KwikPen INJECT BEFORE EACH MEAL WITH 1 UNIT PER 10 GM CARBS PLUS CORRECTIN OF 1 UNIT PER 50 MG     insulin degludec (TRESIBA FLEXTOUCH) 100 UNIT/ML SOPN FlexTouch Pen Inject 24 units at bedtime daily     LATUDA 80 MG TABS tablet Take 120 mg by mouth daily.   0   meloxicam (MOBIC) 15 MG tablet 1 tab by mouth daily as needed for mild to mod pain 90 tablet 3   pindolol (VISKEN) 5 MG tablet TAKE 1 TABLET BY MOUTH TWO  TIMES DAILY 180 tablet 0   risperidone (RISPERDAL) 4 MG tablet Take 8  mg by mouth at bedtime.  0   zolpidem (AMBIEN) 10 MG tablet Take 10 mg by mouth at bedtime.  1   No facility-administered medications prior to visit.     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 ([place])    Bipolar disorder ([place]) 02/15/2011   Cervical spondylosis without myelopathy    Depression    Depression    Diabetes mellitus    sees Dr Altheimer/endo   Dysautonomia ([place]) 10/24/2011   Dyskinesia  subacute,due to drugs   Dyslipidemia    History of nuclear stress test    Myoview 11/16: EF 81%, no ischemia; Low Risk   Hyperlipidemia    IBS (irritable bowel syndrome) 02/15/2011   Insomnia 02/21/2011   Multinodular thyroid    with large dominant solid nodule with calcifications in the lower left pole benign by FNA in 1/12 and stable by repeat ulltrasound in 4/13   Neuropathy    with paresthesias in feet   Orthostatic hypotension    Orthostatic hypotension    Parkinsonism (HCC)    due to prolonged prozac/risperdl for yrs/Dr Dohmeir   Parkinsonism (HCC)    POTS (postural orthostatic tachycardia syndrome)    Radiculopathy    cervical   Resting tremor    Rotator cuff syndrome of left shoulder    S/P partial hysterectomy    Spondylosis without myelopathy    Subacute dyskinesia due to drugs(333.85)    dr Amit Meloy/neuro   Tachycardia    syndrome   Therapy    on therapy by psychiatrist using sebutex    Thyroid nodule    s/p biopsy - benign approx jan 2012   Tremor     PAST SURGICAL HISTORY: Past Surgical History:  Procedure Laterality Date   ACROMIOPLASTY Left 2002   Dr Gladstone Lighter   BREAST EXCISIONAL BIOPSY Right    benign   BREAST LUMPECTOMY Right    herniated disc cervical spine     lower back  1992   OOPHORECTOMY     PARTIAL HYSTERECTOMY      SHOULDER SURGERY Left    SPINE SURGERY  feb 2012   c5-6 fusion, Dr Rolena Infante   THYROID LOBECTOMY  11/11   nodule biopsy, benign    FAMILY HISTORY: Family History  Problem Relation Age of Onset   Lung cancer Mother    Heart disease Father    Hypertension Father    Pneumonia Father    Heart disease Paternal Grandmother    ALS Paternal Grandmother    Hypertension Sister     SOCIAL HISTORY: Social History   Socioeconomic History   Marital status: Married    Spouse name: Not on file   Number of children: 0   Years of education: BA   Highest education level: Not on file  Occupational History   Occupation: Product manager: [employer] strain: Not hard at all   Food insecurity    Worry: Never true    Inability: Never true   Transportation needs    Medical: No    Non-medical: No  Tobacco Use   Smoking status: Never Smoker   Smokeless tobacco: Never Used  Substance and Sexual Activity   Alcohol use: No    Alcohol/week: 0.0 standard drinks   Drug use: No   Sexual activity: Never  Lifestyle   Physical activity    Days per week: 3 days    Minutes per session: 40 min   Stress: Not at all  Relationships   Social connections    Talks on phone: More than three times a week    Gets together: More than three times a week    Attends religious service: More than 4 times per year    Active member of club or organization: Yes    Attends meetings of clubs or organizations: More than 4 times per year    Relationship status: Married   Intimate partner violence    Fear of  current or ex partner: No    Emotionally abused: No    Physically abused: No    Forced sexual activity: No  Other Topics Concern   Not on file  Social History Narrative   Patient is single and lives with my partner ([place]).   Patient is disabled.   Patient is right-handed.   Patient has a Haematologist.   Patient  drinks one soda every other day.      PHYSICAL EXAM  Vitals:   10/06/18 1303  BP: (!) 147/87  Pulse: 80  Temp: 98 F (36.7 C)  Weight: 147 lb (66.7 kg)  Barnes: 5\' 7"  (1.702 m)   Body mass index is 23.02 kg/m.  Generalized: Well developed, in no acute distress  Cardiology: normal rate and rhythm, no murmur noted Neurological examination  Mentation: Alert oriented to time, place, history taking. Follows all commands speech and language fluent Cranial nerve II-XII: Pupils were equal round reactive to light. Extraocular movements were full, visual field were full on confrontational test. Facial sensation and strength were normal. Uvula tongue midline. Head turning and shoulder shrug  were normal and symmetric. Motor: The motor testing reveals 5 over 5 strength of bilateral upper extremities.  4/5 of bilateral lower extremities.  Good symmetric motor tone is noted throughout.  No bradykinesia noted today.  She does have tremor of her jaw that is worse with action. Sensory: Sensory testing is intact to soft touch on all 4 extremities. No evidence of extinction is noted.  Coordination: Cerebellar testing reveals good finger-nose-finger bilaterally.  Gait and station: Gait is narrow but stable. Reflexes: Deep tendon reflexes are decreased but symmetric bilaterally in upper and lower extremities,.   DIAGNOSTIC DATA (LABS, IMAGING, TESTING) - I reviewed patient records, labs, notes, testing and imaging myself where available.  MMSE - Mini Mental State Exam 08/21/2014  Orientation to time 5  Orientation to Place 5  Registration 3  Attention/ Calculation 5  Recall 3  Language- name 2 objects 2  Language- repeat 1  Language- follow 3 step command 2  Language- read & follow direction 1  Write a sentence 1  Copy design 1  Total score 29     Lab Results  Component Value Date   WBC 7.0 09/21/2018   HGB 13.4 09/21/2018   HCT 39.4 09/21/2018   MCV 94.1 09/21/2018   PLT 284.0  09/21/2018      Component Value Date/Time   NA 141 09/21/2018 1414   NA 144 01/05/2018 1125   K 4.2 09/21/2018 1414   CL 103 09/21/2018 1414   CO2 30 09/21/2018 1414   GLUCOSE 275 (H) 09/21/2018 1414   BUN 24 (H) 09/21/2018 1414   BUN 19 01/05/2018 1125   CREATININE 0.91 09/21/2018 1414   CREATININE 0.87 07/06/2015 1523   CALCIUM 9.8 09/21/2018 1414   PROT 6.3 09/21/2018 1414   PROT 6.0 01/05/2018 1125   ALBUMIN 3.9 09/21/2018 1414   ALBUMIN 3.9 01/05/2018 1125   AST 13 09/21/2018 1414   ALT 11 09/21/2018 1414   ALKPHOS 63 09/21/2018 1414   BILITOT 0.5 09/21/2018 1414   BILITOT 0.4 01/05/2018 1125   GFRNONAA 65 01/05/2018 1125   GFRAA 75 01/05/2018 1125   Lab Results  Component Value Date   CHOL 169 09/21/2018   HDL 56.40 09/21/2018   LDLCALC 99 09/21/2018   TRIG 67.0 09/21/2018   CHOLHDL 3 09/21/2018   Lab Results  Component Value Date   HGBA1C 8.3 (H)  09/21/2018   Lab Results  Component Value Date   [id number] 09/21/2018   Lab Results  Component Value Date   TSH 1.43 09/21/2018       ASSESSMENT AND PLAN 58 y.o. 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 ([place]), Bipolar disorder ([place]) (02/15/2011), Cervical spondylosis without myelopathy, Depression, Depression, Diabetes mellitus, Dysautonomia ([place]) (10/24/2011), Dyskinesia, Dyslipidemia, History of nuclear stress test, Hyperlipidemia, IBS (irritable bowel syndrome) (02/15/2011), Insomnia (02/21/2011), Multinodular thyroid, Neuropathy, Orthostatic hypotension, Orthostatic hypotension, Parkinsonism ([place]), Parkinsonism ([place]), POTS (postural orthostatic tachycardia syndrome), Radiculopathy, Resting tremor, Rotator cuff syndrome of left shoulder, S/P partial hysterectomy, Spondylosis without myelopathy, Subacute dyskinesia due to drugs(333.85), Tachycardia, Therapy, Thyroid nodule, and Tremor.   Patient is long  established here with Secondary parkinsonism, tremor dominant, responding to Dopamine therapy.     Overall Mrs. Martinec is doing well on Sinemet.  She has noted about a 50% improvement in her symptoms since increasing dose to 3 times a day about a year ago.   She does continue to have concerns with her gait and tremor.  Tremor does seem to be worse with activity.  DaTscan November 2019 had not revealed a primary Parkinson's disease.   Symptoms most likely related to medications.   She continues close follow-up with her psychiatrist and feels stable from mental health standpoint.  She is also working closely with cardiology for POTS.  We will continue her current therapy.  I have also discussed the case with Dr. Brett Barnes.   A referral per patient's request to a movement specialist, Dr. Linus Mako, at [place] had not taken place because of covid 19- .  We will see her back in the office in 8 months pending the visit with Professor Linus Mako.  A new appointment request will be issued.    No blood work needed.   I spent 25  minutes with the patient. About  50% of this time was spent counseling and educating patient on plan of care and medications.    Lynn Barnes , MD  10/06/2018, 1:15 PM [facility] [address] [place], [place] [address] [phone]

## 2018-11-11 ENCOUNTER — Telehealth: Payer: Self-pay | Admitting: Internal Medicine

## 2018-11-11 MED ORDER — TRAMADOL HCL 50 MG PO TABS: 50.0000 mg | ORAL_TABLET | Freq: Four times a day (QID) | ORAL | 0 refills | Status: DC | PRN

## 2018-11-11 NOTE — Telephone Encounter (Signed)
Ok, done erx 

## 2018-11-11 NOTE — Addendum Note (Signed)
Addended by: Biagio Borg on: 11/11/2018 12:50 PM   Modules accepted: Orders

## 2018-11-11 NOTE — Telephone Encounter (Signed)
Pt saw dr Jenny Reichmann on 09-21-2018 for neck pain and was given muscle relaxer. Pt said muscle relaxer did not work and she would like tramadol. [facility] [address] [facility] [place]

## 2018-11-12 MED ORDER — TRAMADOL HCL 50 MG PO TABS: 50.0000 mg | ORAL_TABLET | Freq: Four times a day (QID) | ORAL | 0 refills | Status: DC | PRN

## 2018-11-12 NOTE — Telephone Encounter (Signed)
Pt called back requesting this Rx be rerouted to  [place], [address] AT [facility] [address]  [place] [address]  Phone: [phone] Fax: [phone]   Please advise

## 2018-11-12 NOTE — Addendum Note (Signed)
Addended by: Biagio Borg on: 11/12/2018 12:42 PM   Modules accepted: Orders

## 2018-11-12 NOTE — Telephone Encounter (Signed)
[facility] 

## 2018-11-12 NOTE — Telephone Encounter (Signed)
Pt called to check on this prescription. Pt states she is going out of town. Please advise.

## 2018-11-19 ENCOUNTER — Other Ambulatory Visit: Payer: Self-pay | Admitting: Internal Medicine

## 2018-11-19 MED ORDER — FLUDROCORTISONE ACETATE 0.1 MG PO TABS: 50.0000 ug | ORAL_TABLET | Freq: Two times a day (BID) | ORAL | 3 refills | Status: DC

## 2018-11-19 NOTE — Telephone Encounter (Signed)
Pt's medication was sent to pt's pharmacy as requested. Confirmation received.  °

## 2018-12-09 ENCOUNTER — Other Ambulatory Visit: Payer: Self-pay | Admitting: Internal Medicine

## 2018-12-09 NOTE — Telephone Encounter (Signed)
Medication Refill - Medication: traMADol (ULTRAM) 50 MG tablet [id number]     Preferred Pharmacy (with phone number or street name):  [facility] [id number], [address] AT [facility] [address]  [place] [address]  Phone: [phone] Fax: [phone]    Agent: Please be advised that RX refills may take up to 3 business days. We ask that you follow-up with your pharmacy.

## 2018-12-10 MED ORDER — TRAMADOL HCL 50 MG PO TABS: 50.0000 mg | ORAL_TABLET | Freq: Four times a day (QID) | ORAL | 2 refills | Status: DC | PRN

## 2018-12-10 NOTE — Telephone Encounter (Signed)
Done erx 

## 2018-12-15 ENCOUNTER — Other Ambulatory Visit: Payer: Self-pay | Admitting: Internal Medicine

## 2018-12-28 ENCOUNTER — Telehealth: Payer: Self-pay | Admitting: Internal Medicine

## 2018-12-28 NOTE — Telephone Encounter (Signed)
rx already done oct 30

## 2018-12-28 NOTE — Telephone Encounter (Signed)
Medication Refill - Medication: tramadol   Has the patient contacted their pharmacy? Yes.   Pt states she only has 4 pills left. Please advise.  (Agent: If no, request that the patient contact the pharmacy for the refill.) (Agent: If yes, when and what did the pharmacy advise?)  Preferred Pharmacy (with phone number or street name):  [facility] [place], [address] AT [address] [place] [address]  Phone: [phone] Fax: [phone]  Not a 24 hour pharmacy; exact hours not known.     Agent: Please be advised that RX refills may take up to 3 business days. We ask that you follow-up with your pharmacy.

## 2018-12-28 NOTE — Telephone Encounter (Signed)
Requested medication (s) are due for refill today:yes  Requested medication (s) are on the active medication list: yes  Last refill:  12/10/2018  Future visit scheduled: yes  Notes to clinic: Patient would like script sent to a different pharmacy    Requested Prescriptions  Pending Prescriptions Disp Refills   traMADol (ULTRAM) 50 MG tablet 30 tablet 2    Sig: Take 1 tablet (50 mg total) by mouth every 6 (six) hours as needed.     Not Delegated - Analgesics:  Opioid Agonists Failed - 12/28/2018  2:34 PM      Failed - This refill cannot be delegated      Failed - Urine Drug Screen completed in last 360 days.      Passed - Valid encounter within last 6 months    Recent Outpatient Visits          3 months ago Preventative health care   [facility] -Junie Bame, Hunt Oris, MD   1 year ago Preventative health care   [facility] -Georges Mouse, MD   2 years ago Preventative health care   [facility] -Georges Mouse, MD   3 years ago Hyperlipidemia   [place], James W, MD   3 years ago Allergic rhinitis, unspecified allergic rhinitis type   [facility] -Georges Mouse, MD      Future Appointments            In 3 months  [place], [place]   In 8 months Jenny Reichmann, Hunt Oris, MD [place], [facility]

## 2018-12-29 MED ORDER — TRAMADOL HCL 50 MG PO TABS: 50.0000 mg | ORAL_TABLET | Freq: Four times a day (QID) | ORAL | 2 refills | Status: DC | PRN

## 2018-12-29 NOTE — Addendum Note (Signed)
Addended by: Biagio Borg on: 12/29/2018 12:56 PM   Modules accepted: Orders

## 2018-12-29 NOTE — Telephone Encounter (Signed)
Done erx 

## 2018-12-29 NOTE — Telephone Encounter (Signed)
Patient states it is a 7 day prescription and she states it is ready for refill Call back [phone]

## 2019-01-18 ENCOUNTER — Telehealth: Payer: Self-pay | Admitting: Internal Medicine

## 2019-01-19 ENCOUNTER — Other Ambulatory Visit: Payer: Self-pay | Admitting: Internal Medicine

## 2019-01-19 ENCOUNTER — Telehealth: Payer: Self-pay | Admitting: Neurology

## 2019-01-19 ENCOUNTER — Other Ambulatory Visit: Payer: Self-pay | Admitting: Neurology

## 2019-01-19 MED ORDER — PINDOLOL 5 MG PO TABS: 5.0000 mg | ORAL_TABLET | Freq: Two times a day (BID) | ORAL | 2 refills | Status: DC

## 2019-01-19 MED ORDER — CARBIDOPA-LEVODOPA 25-100 MG PO TABS: ORAL_TABLET | ORAL | 1 refills | Status: DC

## 2019-01-19 NOTE — Telephone Encounter (Signed)
Pt calling.  States she no longer wants to use mail order and needs script to go to  [place], [address] AT [place] [phone] (Phone) [phone] (Fax)

## 2019-01-19 NOTE — Telephone Encounter (Signed)
Refill sent for the patient as requested with 3 mth supply.

## 2019-01-19 NOTE — Telephone Encounter (Signed)
Pt is wanting to know if her carbidopa-levodopa (SINEMET IR) 25-100 MG tablet can be sent to [facility] [address] [facility] of [facility] Please advise.

## 2019-01-19 NOTE — Telephone Encounter (Signed)
Pt's medication was sent to pt's pharmacy as requested. Confirmation received.  °

## 2019-01-25 ENCOUNTER — Telehealth: Payer: Self-pay | Admitting: *Deleted

## 2019-01-25 NOTE — Telephone Encounter (Signed)
Letter done

## 2019-01-25 NOTE — Telephone Encounter (Signed)
Called pt- Her jury duty date is February 14, 2019. She is afraid she will have a low blood sugar and pass out.   Juror # :[id number]  Please advise

## 2019-01-25 NOTE — Telephone Encounter (Signed)
Patient requesting a callback in regards to note excusing patient  From jury duty. Patient would like a callback to speak with nurse on specifics for letter. Please advise

## 2019-01-25 NOTE — Telephone Encounter (Signed)
Pt called back about note for jury duty / she stated that she needs it by tomorrow/ please advise

## 2019-01-25 NOTE — Telephone Encounter (Signed)
Copied from [place] [phone]. Topic: General - Other >> Jan 25, 2019  8:03 AM Keene Breath wrote: Reason for CRM: Patient called to request a note to excuse her from [place] duty on 02/14/19.  Patient stated that she needs the note this week.  Please advise and call to discuss at [phone]

## 2019-01-26 NOTE — Telephone Encounter (Signed)
LVM informing patient that letter has been printed and is ready for pick up at the front desk.

## 2019-01-26 NOTE — Telephone Encounter (Signed)
Pt would like to come pick this letter up today at the office.  Please advise if this is ok  [phone]

## 2019-03-03 DIAGNOSIS — E1065 Type 1 diabetes mellitus with hyperglycemia: Secondary | ICD-10-CM | POA: Diagnosis not present

## 2019-03-03 DIAGNOSIS — E041 Nontoxic single thyroid nodule: Secondary | ICD-10-CM | POA: Diagnosis not present

## 2019-03-03 DIAGNOSIS — E782 Mixed hyperlipidemia: Secondary | ICD-10-CM | POA: Diagnosis not present

## 2019-03-07 DIAGNOSIS — E103293 Type 1 diabetes mellitus with mild nonproliferative diabetic retinopathy without macular edema, bilateral: Secondary | ICD-10-CM | POA: Diagnosis not present

## 2019-03-07 DIAGNOSIS — E1065 Type 1 diabetes mellitus with hyperglycemia: Secondary | ICD-10-CM | POA: Diagnosis not present

## 2019-03-07 DIAGNOSIS — E041 Nontoxic single thyroid nodule: Secondary | ICD-10-CM | POA: Diagnosis not present

## 2019-03-07 DIAGNOSIS — E1042 Type 1 diabetes mellitus with diabetic polyneuropathy: Secondary | ICD-10-CM | POA: Diagnosis not present

## 2019-03-07 DIAGNOSIS — I498 Other specified cardiac arrhythmias: Secondary | ICD-10-CM | POA: Diagnosis not present

## 2019-03-07 DIAGNOSIS — Z9641 Presence of insulin pump (external) (internal): Secondary | ICD-10-CM | POA: Diagnosis not present

## 2019-03-07 DIAGNOSIS — E782 Mixed hyperlipidemia: Secondary | ICD-10-CM | POA: Diagnosis not present

## 2019-03-07 DIAGNOSIS — E1043 Type 1 diabetes mellitus with diabetic autonomic (poly)neuropathy: Secondary | ICD-10-CM | POA: Diagnosis not present

## 2019-03-19 ENCOUNTER — Other Ambulatory Visit: Payer: Self-pay | Admitting: Internal Medicine

## 2019-03-19 NOTE — Telephone Encounter (Signed)
Done erx 

## 2019-03-22 DIAGNOSIS — F3189 Other bipolar disorder: Secondary | ICD-10-CM | POA: Diagnosis not present

## 2019-03-29 ENCOUNTER — Ambulatory Visit: Payer: Medicare Other

## 2019-04-13 DIAGNOSIS — E1065 Type 1 diabetes mellitus with hyperglycemia: Secondary | ICD-10-CM | POA: Diagnosis not present

## 2019-04-14 ENCOUNTER — Other Ambulatory Visit: Payer: Self-pay | Admitting: Internal Medicine

## 2019-04-14 MED ORDER — TRAMADOL HCL 50 MG PO TABS: ORAL_TABLET | ORAL | 2 refills | Status: DC

## 2019-04-14 NOTE — Telephone Encounter (Signed)
Done erx 

## 2019-04-14 NOTE — Telephone Encounter (Signed)
New message:   Pt states she called the pharmacy and they will not accept a fax for this prescription. She states Dr. Jenny Reichmann has to call and let them know the prescription can be filled.
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
## 2019-04-15 NOTE — Telephone Encounter (Signed)
Already done mar 4

## 2019-04-15 NOTE — Telephone Encounter (Addendum)
Patient last filled the Tramadol on 03/26/2019. So the request is for an EARLY fill of Tramadol and not a refill.   Okay for early fill?    Pt called back and asked about the request. I informed that I was just waiting on an okay from PCP.  Pt stated that she lost the previous prescription in a bathroom stall.

## 2019-04-15 NOTE — Telephone Encounter (Signed)
[place] x 2. On hold too long. I will call again between patient care.

## 2019-04-15 NOTE — Telephone Encounter (Signed)
Called pharmacy back and gave PCP approval for early fill of tramadol.

## 2019-04-27 DIAGNOSIS — M2040 Other hammer toe(s) (acquired), unspecified foot: Secondary | ICD-10-CM | POA: Diagnosis not present

## 2019-04-27 DIAGNOSIS — M79671 Pain in right foot: Secondary | ICD-10-CM | POA: Diagnosis not present

## 2019-05-06 ENCOUNTER — Other Ambulatory Visit: Payer: Self-pay

## 2019-05-06 ENCOUNTER — Ambulatory Visit (INDEPENDENT_AMBULATORY_CARE_PROVIDER_SITE_OTHER): Payer: Medicare PPO

## 2019-05-06 VITALS — BP 124/70 | HR 92 | Temp 99.4°F | Resp 16 | Ht 67.0 in | Wt 138.2 lb

## 2019-05-06 DIAGNOSIS — Z0000 Encounter for general adult medical examination without abnormal findings: Secondary | ICD-10-CM

## 2019-05-06 NOTE — Progress Notes (Signed)
Subjective:   Lynn Barnes is a 59 y.o. female who presents for Medicare Annual (Subsequent) preventive examination.  Review of Systems:   Medicare Wellness Visit. Cardiac Risk Factors include: diabetes mellitus;hypertension  Sleep patterns: feels rested and she takes Ambien to help with insomnia. Home safety/Smoke alarms: Feels safe in her home. Smoke alarms in place. Seat belt safety: wears seat belt    Objective:     Vitals: BP 124/70 (BP Location: Left Arm, Patient Position: Sitting, Cuff Size: Normal)   Pulse 92   Temp 99.4 F (37.4 C)   Resp 16   Ht 5\' 7"  (1.702 m)   Wt 138 lb 3.2 oz (62.7 kg)   SpO2 95%   BMI 21.65 kg/m   Body mass index is 21.65 kg/m.  Advanced Directives 05/06/2019 03/23/2018 08/21/2014  Does Patient Have a Medical Advance Directive? No Yes Yes  Type of Advance Directive - [place];Living will [place];Living will  Copy of [place] in Chart? - No - copy requested No - copy requested  Would patient like information on creating a medical advance directive? No - Patient declined - -    Tobacco Social History   Tobacco Use  Smoking Status Never Smoker  Smokeless Tobacco Never Used     Counseling given: Not Answered  Clinical Intake:  Pre-visit preparation completed: Yes  Pain : No/denies pain   Nutritional Risks: None Diabetes: Yes(Type 1) CBG done?: No Did pt. bring in CBG monitor from home?: No  How often do you need to have someone help you when you read instructions, pamphlets, or other written materials from your doctor or pharmacy?: 1 - Never What is the last grade level you completed in school?: 2 years of [employer] Needed?: No  Information entered by :: [employer]. Lowell Guitar, LPN  Past Medical History:  Diagnosis Date  . 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 Addiction, opium ([place])   . Allergy    spring  . Anxiety    ADHD CHRONIC ANXIETY  .  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 Bipolar disorder ([place]) 02/15/2011  . Cervical spondylosis without myelopathy   . Depression   . Depression   . Diabetes mellitus    sees Dr Altheimer/endo  . Dysautonomia ([place]) 10/24/2011  . Dyskinesia     subacute,due to drugs  . Dyslipidemia   . History of nuclear stress test    Myoview 11/16: EF 81%, no ischemia; Low Risk  . Hyperlipidemia   . IBS (irritable bowel syndrome) 02/15/2011  . Insomnia 02/21/2011  . Multinodular thyroid    with large dominant solid nodule with calcifications in the lower left pole benign by FNA in 1/12 and stable by repeat ulltrasound in 4/13  . Neuropathy    with paresthesias in feet  . Orthostatic hypotension   . Orthostatic hypotension   . 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 Parkinsonism ([place])   . POTS (postural orthostatic tachycardia syndrome)   . Radiculopathy    cervical  . Resting tremor   . Rotator cuff syndrome of left shoulder   . S/P partial hysterectomy   . Spondylosis without myelopathy   . Subacute dyskinesia due to drugs(333.85)    dr dohmeier/neuro  . Tachycardia    syndrome  . Therapy    on therapy by psychiatrist using sebutex   . Thyroid  nodule    s/p biopsy - benign approx jan 2012  . Tremor    Past Surgical History:  Procedure Laterality Date  . ACROMIOPLASTY Left 2002   Dr Gladstone Lighter  . BREAST EXCISIONAL BIOPSY Right    benign  . BREAST LUMPECTOMY Right   . herniated disc cervical spine    . lower back  1992  . OOPHORECTOMY    . PARTIAL HYSTERECTOMY    . SHOULDER SURGERY Left   . SPINE SURGERY  feb 2012   c5-6 fusion, Dr Rolena Infante  . THYROID LOBECTOMY  11/11   nodule biopsy, benign   Family History  Problem Relation Age of Onset  . Lung cancer Mother   . Heart disease Father   . Hypertension Father   . Pneumonia Father   . Heart disease Paternal Grandmother   . ALS Paternal Grandmother   . Hypertension Sister    Social  History   Socioeconomic History  . Marital status: Married    Spouse name: Not on file  . Number of children: 0  . Years of education: BA  . Highest education level: Not on file  Occupational History  . Occupation: Product manager: [employer]  Tobacco Use  . Smoking status: Never Smoker  . Smokeless tobacco: Never Used  Substance and Sexual Activity  . Alcohol use: No    Alcohol/week: 0.0 standard drinks  . Drug use: No  . Sexual activity: Never  Other Topics Concern  . Not on file  Social History Narrative   Patient is single and lives with my partner ([place]).   Patient is disabled.   Patient is right-handed.   Patient has a Haematologist.   Patient drinks one soda every other day.   Social Determinants of Health   Financial Resource Strain:   . Difficulty of Paying Living Expenses:   Food Insecurity:   . Worried About Charity fundraiser in the Last Year:   . Arboriculturist in the Last Year:   Transportation Needs:   . Film/video editor (Medical):   [name] Lack of Transportation (Non-Medical):   Physical Activity:   . Days of Exercise per Week:   . Minutes of Exercise per Session:   Stress:   . Feeling of Stress :   Social Connections:   . Frequency of Communication with Friends and Family:   . Frequency of Social Gatherings with Friends and Family:   . Attends Religious Services:   . Active Member of Clubs or Organizations:   . Attends Archivist Meetings:   [name] Marital Status:     Outpatient Encounter Medications as of 05/06/2019  Medication Sig  . atorvastatin (LIPITOR) 40 MG tablet TAKE 1 TABLET BY MOUTH  DAILY  . carbidopa-levodopa (SINEMET IR) 25-100 MG tablet TAKE 1 TABLET BY MOUTH 3  TIMES A DAY BEFORE MEALS  . clonazePAM (KLONOPIN) 1 MG tablet Take 1 mg by mouth 3 (three) times daily.  . fludrocortisone (FLORINEF) 0.1 MG tablet Take 0.5 tablets (50 mcg total) by mouth 2 (two) times daily.  . Glucagon 3 MG/DOSE  POWD Place into the nose.  [name] glucose blood test strip 1 each by Other route. 7 times per day   - as instructed  . HUMALOG KWIKPEN 100 UNIT/ML KwikPen INJECT BEFORE EACH MEAL WITH 1 UNIT PER 10 GM CARBS PLUS CORRECTIN OF 1 UNIT PER 50 MG  . insulin degludec (TRESIBA FLEXTOUCH) 100 UNIT/ML SOPN FlexTouch  Pen Inject 24 units at bedtime daily  . LATUDA 80 MG TABS tablet Take 120 mg by mouth daily.   . meloxicam (MOBIC) 15 MG tablet 1 tab by mouth daily as needed for mild to mod pain  . pindolol (VISKEN) 5 MG tablet Take 1 tablet (5 mg total) by mouth 2 (two) times daily.  . risperidone (RISPERDAL) 4 MG tablet Take 8 mg by mouth at bedtime.  . traMADol (ULTRAM) 50 MG tablet TAKE 1 TABLET(50 MG) BY MOUTH EVERY 6 HOURS AS NEEDED  . zolpidem (AMBIEN) 10 MG tablet Take 10 mg by mouth at bedtime.   No facility-administered encounter medications on file as of 05/06/2019.    Activities of Daily Living In your present state of health, do you have any difficulty performing the following activities: 05/06/2019  Hearing? N  Vision? N  Difficulty concentrating or making decisions? N  Walking or climbing stairs? N  Dressing or bathing? N  Doing errands, shopping? N  Preparing Food and eating ? N  Using the Toilet? N  In the past six months, have you accidently leaked urine? N  Do you have problems with loss of bowel control? N  Managing your Medications? N  Managing your Finances? N  Housekeeping or managing your Housekeeping? N  Some recent data might be hidden    Patient Care Team: Biagio Borg, MD as PCP - General (Internal Medicine) Fay Records, MD as PCP - Cardiology (Cardiology) Dohmeier, Asencion Partridge, MD as Consulting Physician (Neurology) Altheimer, Legrand Como, MD as Referring Physician (Endocrinology) Dan Humphreys (Nurse Practitioner)    Assessment:   This is a routine wellness examination for [place].  Exercise Activities and Dietary recommendations Current Exercise Habits: The  patient does not participate in regular exercise at present, Exercise limited by: neurologic condition(s);psychological condition(s)  Goals    . Patient Stated     Increase the amount physical activity by continuing to walk and go to the gym once a week.    . To get blood sugars under control (pt-stated)       Fall Risk Fall Risk  05/06/2019 09/21/2018 03/23/2018 01/05/2018 08/24/2017  Falls in the past year? 1 0 1 0 Yes  Number falls in past yr: 1 - 0 - 2 or more  Comment - - - - -  Injury with Fall? 0 - 0 - No  Risk Factor Category  - - - - -  Risk for fall due to : Medication side effect;Impaired balance/gait;Impaired mobility - Impaired mobility;Impaired balance/gait - -  Follow up Falls evaluation completed;Falls prevention discussed - Falls prevention discussed - -   Is the patient's home free of loose throw rugs in walkways, pet beds, electrical cords, etc?   yes      Grab bars in the bathroom? yes      Handrails on the stairs?   yes      Adequate lighting?   yes  Depression Screen PHQ 2/9 Scores 05/06/2019 09/21/2018 03/23/2018 08/24/2017  PHQ - 2 Score 0 0 2 2  PHQ- 9 Score - - 7 -     Cognitive Function MMSE - Mini Mental State Exam 08/21/2014  Orientation to time 5  Orientation to Place 5  Registration 3  Attention/ Calculation 5  Recall 3  Language- name 2 objects 2  Language- repeat 1  Language- follow 3 step command 2  Language- read & follow direction 1  Write a sentence 1  Copy design 1  Total score 29  Montreal Cognitive Assessment  04/02/2016 02/21/2015  Visuospatial/ Executive (0/5) 5 5  Naming (0/3) 3 3  Attention: Read list of digits (0/2) 2 2  Attention: Read list of letters (0/1) 1 1  Attention: Serial 7 subtraction starting at 100 (0/3) 3 3  Language: Repeat phrase (0/2) 2 2  Language : Fluency (0/1) 0 0  Abstraction (0/2) 2 2  Delayed Recall (0/5) 2 3  Orientation (0/6) 5 5  Total 25 26  Adjusted Score (based on education) 25 26   6CIT Screen  05/06/2019  What Year? 0 points  What month? 0 points  What time? 0 points  Count back from 20 0 points  Months in reverse 0 points  Repeat phrase 0 points  Total Score 0    Immunization History  Administered Date(s) Administered  . Influenza Inj Mdck Quad Pf 10/29/2018  . Influenza Whole 11/11/2007, 04/24/2008  . Influenza, Seasonal, Injecte, Preservative Fre 11/10/2012  . Influenza,inj,Quad PF,6+ Mos 09/27/2013  . Influenza-Unspecified 11/13/2017  . Pneumococcal Conjugate-13 08/21/2016  . Pneumococcal Polysaccharide-23 08/24/2017    Qualifies for Shingles Vaccine? Will discuss at next office visit  Screening Tests Health Maintenance  Topic Date Due  . PAP SMEAR-Modifier  09/05/2008  . COLONOSCOPY  05/14/2016  . OPHTHALMOLOGY EXAM  12/31/2018  . HEMOGLOBIN A1C  03/24/2019  . MAMMOGRAM  09/16/2019  . FOOT EXAM  09/21/2019  . URINE MICROALBUMIN  09/21/2019  . TETANUS/TDAP  12/11/2020  . INFLUENZA VACCINE  Completed  . PNEUMOCOCCAL POLYSACCHARIDE VACCINE AGE 23-64 HIGH RISK  Completed  . Hepatitis C Screening  Completed  . HIV Screening  Completed   Cancer Screenings: Lung: Low Dose CT Chest recommended if Age 73-80 years, 30 pack-year currently smoking OR have quit w/in 15years. Patient does not qualify. Breast:  Up to date on Mammogram? Yes   Up to date of Bone Density/Dexa? No Colorectal: due 05/13/2021   Plan:    I have personally reviewed and noted the following in the patient's chart:   . Medical and social history . Use of alcohol, tobacco or illicit drugs  . Current medications and supplements . Functional ability and status . Nutritional status . Physical activity . Advanced directives . List of other physicians . Hospitalizations, surgeries, and ER visits in previous 12 months . Vitals . Screenings to include cognitive, depression, and falls . Referrals and appointments  In addition, I have reviewed and discussed with patient certain preventive  protocols, quality metrics, and best practice recommendations. A written personalized care plan for preventive services as well as general preventive health recommendations were provided to patient.     Sheral Flow, LPN  Nurse Health Advisor  05/06/2019

## 2019-05-06 NOTE — Patient Instructions (Addendum)
[name] , Thank you for taking time to come for your Medicare Wellness Visit. I appreciate your ongoing commitment to your health goals. Please review the following plan we discussed and let me know if I can assist you in the future.   Screening recommendations/referrals: Colorectal Screening: 05/13/2021 Mammogram: 12/14/2019 Bone Density: due  Vision and Dental Exams: Recommended annual ophthalmology exams for early detection of glaucoma and other disorders of the eye Recommended annual dental exams for proper oral hygiene  Diabetic Exams: Diabetic Eye Exam: 12/30/2017 Diabetic Foot Exam: 09/21/2018  Vaccinations: Influenza vaccine: 10/29/2018 Pneumococcal vaccine: 08/21/2016, 08/24/2017 Tdap vaccine: Declined. Please call your insurance company to determine your out of pocket expense. You may also receive this vaccine at your local pharmacy or Health Dept. Shingles vaccine: Please call your insurance company to determine your out of pocket expense for the Shingrix vaccine. You may receive this vaccine at your local pharmacy. Covid vaccine: 03/25/19, 04/22/19  Advanced directives: Advance directives discussed with you today. I have provided a copy for you to complete at home and have notarized. Once this is complete please bring a copy in to our office so we can scan it into your chart.  Goals:   Recommend to drink at least 6-8 8oz glasses of water per day.  Recommend to exercise for at least 150 minutes per week.  Recommend to remove any items from the home that may cause slips or trips.  Recommend to decrease portion sizes by eating 3 small healthy meals and at least 2 healthy snacks per day.  Recommend to begin DASH diet as directed below  Next appointment: Please schedule your Annual Wellness Visit with your Nurse Health Advisor in one year.  Preventive Care 40-64 Years, Female Preventive care refers to lifestyle choices and visits with your health care provider that can promote  health and wellness. What does preventive care include?  A yearly physical exam. This is also called an annual well check.  Dental exams once or twice a year.  Routine eye exams. Ask your health care provider how often you should have your eyes checked.  Personal lifestyle choices, including:  Daily care of your teeth and gums.  Regular physical activity.  Eating a healthy diet.  Avoiding tobacco and drug use.  Limiting alcohol use.  Practicing safe sex.  Taking low-dose aspirin daily starting at age 109 if recommended by your health care provider.  Taking vitamin and mineral supplements as recommended by your health care provider. What happens during an annual well check? The services and screenings done by your health care provider during your annual well check will depend on your age, overall health, lifestyle risk factors, and family history of disease. Counseling  Your health care provider may ask you questions about your:  Alcohol use.  Tobacco use.  Drug use.  Emotional well-being.  Home and relationship well-being.  Sexual activity.  Eating habits.  Work and work Statistician.  Method of birth control.  Menstrual cycle.  Pregnancy history. Screening  You may have the following tests or measurements:  Height, weight, and BMI.  Blood pressure.  Lipid and cholesterol levels. These may be checked every 5 years, or more frequently if you are over 19 years old.  Skin check.  Lung cancer screening. You may have this screening every year starting at age 24 if you have a 30-pack-year history of smoking and currently smoke or have quit within the past 15 years.  Fecal occult blood test (FOBT) of the  stool. You may have this test every year starting at age 62.  Flexible sigmoidoscopy or colonoscopy. You may have a sigmoidoscopy every 5 years or a colonoscopy every 10 years starting at age 69.  Hepatitis C blood test.  Hepatitis B blood  test.  Sexually transmitted disease (STD) testing.  Diabetes screening. This is done by checking your blood sugar (glucose) after you have not eaten for a while (fasting). You may have this done every 1-3 years.  Mammogram. This may be done every 1-2 years. Talk to your health care provider about when you should start having regular mammograms. This may depend on whether you have a family history of breast cancer.  BRCA-related cancer screening. This may be done if you have a family history of breast, ovarian, tubal, or peritoneal cancers.  Pelvic exam and Pap test. This may be done every 3 years starting at age 61. Starting at age 55, this may be done every 5 years if you have a Pap test in combination with an HPV test.  Bone density scan. This is done to screen for osteoporosis. You may have this scan if you are at high risk for osteoporosis. Discuss your test results, treatment options, and if necessary, the need for more tests with your health care provider. Vaccines  Your health care provider may recommend certain vaccines, such as:  Influenza vaccine. This is recommended every year.  Tetanus, diphtheria, and acellular pertussis (Tdap, Td) vaccine. You may need a Td booster every 10 years.  Zoster vaccine. You may need this after age 5.  Pneumococcal 13-valent conjugate (PCV13) vaccine. You may need this if you have certain conditions and were not previously vaccinated.  Pneumococcal polysaccharide (PPSV23) vaccine. You may need one or two doses if you smoke cigarettes or if you have certain conditions. Talk to your health care provider about which screenings and vaccines you need and how often you need them. This information is not intended to replace advice given to you by your health care provider. Make sure you discuss any questions you have with your health care provider. Document Released: 02/23/2015 Document Revised: 10/17/2015 Document Reviewed: 11/28/2014 Elsevier  Interactive Patient Education  2017 [place] Prevention in the Home Falls can cause injuries. They can happen to people of all ages. There are many things you can do to make your home safe and to help prevent falls. What can I do on the outside of my home?  Regularly fix the edges of walkways and driveways and fix any cracks.  Remove anything that might make you trip as you walk through a door, such as a raised step or threshold.  Trim any bushes or trees on the path to your home.  Use bright outdoor lighting.  Clear any walking paths of anything that might make someone trip, such as rocks or tools.  Regularly check to see if handrails are loose or broken. Make sure that both sides of any steps have handrails.  Any raised decks and porches should have guardrails on the edges.  Have any leaves, snow, or ice cleared regularly.  Use sand or salt on walking paths during winter.  Clean up any spills in your garage right away. This includes oil or grease spills. What can I do in the bathroom?  Use night lights.  Install grab bars by the toilet and in the tub and shower. Do not use towel bars as grab bars.  Use non-skid mats or decals in the  tub or shower.  If you need to sit down in the shower, use a plastic, non-slip stool.  Keep the floor dry. Clean up any water that spills on the floor as soon as it happens.  Remove soap buildup in the tub or shower regularly.  Attach bath mats securely with double-sided non-slip rug tape.  Do not have throw rugs and other things on the floor that can make you trip. What can I do in the bedroom?  Use night lights.  Make sure that you have a light by your bed that is easy to reach.  Do not use any sheets or blankets that are too big for your bed. They should not hang down onto the floor.  Have a firm chair that has side arms. You can use this for support while you get dressed.  Do not have throw rugs and other things on  the floor that can make you trip. What can I do in the kitchen?  Clean up any spills right away.  Avoid walking on wet floors.  Keep items that you use a lot in easy-to-reach places.  If you need to reach something above you, use a strong step stool that has a grab bar.  Keep electrical cords out of the way.  Do not use floor polish or wax that makes floors slippery. If you must use wax, use non-skid floor wax.  Do not have throw rugs and other things on the floor that can make you trip. What can I do with my stairs?  Do not leave any items on the stairs.  Make sure that there are handrails on both sides of the stairs and use them. Fix handrails that are broken or loose. Make sure that handrails are as long as the stairways.  Check any carpeting to make sure that it is firmly attached to the stairs. Fix any carpet that is loose or worn.  Avoid having throw rugs at the top or bottom of the stairs. If you do have throw rugs, attach them to the floor with carpet tape.  Make sure that you have a light switch at the top of the stairs and the bottom of the stairs. If you do not have them, ask someone to add them for you. What else can I do to help prevent falls?  Wear shoes that:  Do not have high heels.  Have rubber bottoms.  Are comfortable and fit you well.  Are closed at the toe. Do not wear sandals.  If you use a stepladder:  Make sure that it is fully opened. Do not climb a closed stepladder.  Make sure that both sides of the stepladder are locked into place.  Ask someone to hold it for you, if possible.  Clearly mark and make sure that you can see:  Any grab bars or handrails.  First and last steps.  Where the edge of each step is.  Use tools that help you move around (mobility aids) if they are needed. These include:  Canes.  Walkers.  Scooters.  Crutches.  Turn on the lights when you go into a dark area. Replace any light bulbs as soon as they burn  out.  Set up your furniture so you have a clear path. Avoid moving your furniture around.  If any of your floors are uneven, fix them.  If there are any pets around you, be aware of where they are.  Review your medicines with your doctor. Some medicines can make  you feel dizzy. This can increase your chance of falling. Ask your doctor what other things that you can do to help prevent falls. This information is not intended to replace advice given to you by your health care provider. Make sure you discuss any questions you have with your health care provider. Document Released: 11/23/2008 Document Revised: 07/05/2015 Document Reviewed: 03/03/2014 Elsevier Interactive Patient Education  2017 [employer].

## 2019-05-09 ENCOUNTER — Ambulatory Visit: Payer: Medicare Other | Admitting: Adult Health

## 2019-06-10 ENCOUNTER — Encounter (HOSPITAL_COMMUNITY): Payer: Self-pay | Admitting: Emergency Medicine

## 2019-06-10 ENCOUNTER — Emergency Department (HOSPITAL_COMMUNITY): Payer: Medicare PPO

## 2019-06-10 ENCOUNTER — Emergency Department (HOSPITAL_COMMUNITY)
Admission: EM | Admit: 2019-06-10 | Discharge: 2019-06-10 | Disposition: A | Discharge status: Home / Self Care | Payer: Medicare PPO | Attending: Emergency Medicine | Admitting: Emergency Medicine

## 2019-06-10 DIAGNOSIS — E079 Disorder of thyroid, unspecified: Secondary | ICD-10-CM | POA: Insufficient documentation

## 2019-06-10 DIAGNOSIS — E109 Type 1 diabetes mellitus without complications: Secondary | ICD-10-CM | POA: Diagnosis not present

## 2019-06-10 DIAGNOSIS — G20 Parkinson's disease: Secondary | ICD-10-CM | POA: Diagnosis not present

## 2019-06-10 DIAGNOSIS — R000 Tachycardia, unspecified: Secondary | ICD-10-CM | POA: Diagnosis not present

## 2019-06-10 DIAGNOSIS — N390 Urinary tract infection, site not specified: Secondary | ICD-10-CM | POA: Insufficient documentation

## 2019-06-10 DIAGNOSIS — W19XXXA Unspecified fall, initial encounter: Secondary | ICD-10-CM | POA: Diagnosis not present

## 2019-06-10 DIAGNOSIS — Z79899 Other long term (current) drug therapy: Secondary | ICD-10-CM | POA: Insufficient documentation

## 2019-06-10 DIAGNOSIS — R270 Ataxia, unspecified: Secondary | ICD-10-CM | POA: Diagnosis not present

## 2019-06-10 DIAGNOSIS — Z20822 Contact with and (suspected) exposure to covid-19: Secondary | ICD-10-CM | POA: Insufficient documentation

## 2019-06-10 DIAGNOSIS — M47812 Spondylosis without myelopathy or radiculopathy, cervical region: Secondary | ICD-10-CM | POA: Diagnosis not present

## 2019-06-10 DIAGNOSIS — R531 Weakness: Secondary | ICD-10-CM | POA: Diagnosis not present

## 2019-06-10 DIAGNOSIS — D72829 Elevated white blood cell count, unspecified: Secondary | ICD-10-CM | POA: Diagnosis not present

## 2019-06-10 LAB — URINALYSIS, ROUTINE W REFLEX MICROSCOPIC
Bilirubin Urine: NEGATIVE
Glucose, UA: 150 mg/dL — AB
Ketones, ur: 20 mg/dL — AB
Nitrite: POSITIVE — AB
Protein, ur: 30 mg/dL — AB
Specific Gravity, Urine: 1.018 (ref 1.005–1.030)
WBC, UA: >50 WBC/hpf — ABNORMAL HIGH (ref 0–5)
pH: 5 (ref 5.0–8.0)

## 2019-06-10 LAB — RESPIRATORY PANEL BY RT PCR (FLU A&B, COVID)
Influenza A by PCR: NEGATIVE
Influenza B by PCR: NEGATIVE
SARS Coronavirus 2 by RT PCR: NEGATIVE

## 2019-06-10 LAB — RAPID URINE DRUG SCREEN, HOSP PERFORMED
Amphetamines: NOT DETECTED
Barbiturates: NOT DETECTED
Benzodiazepines: POSITIVE — AB
Cocaine: NOT DETECTED
Opiates: NOT DETECTED
Tetrahydrocannabinol: NOT DETECTED

## 2019-06-10 LAB — BASIC METABOLIC PANEL
Anion gap: 9 (ref 5–15)
BUN: 24 mg/dL — ABNORMAL HIGH (ref 6–20)
CO2: 25 mmol/L (ref 22–32)
Calcium: 8.5 mg/dL — ABNORMAL LOW (ref 8.9–10.3)
Chloride: 106 mmol/L (ref 98–111)
Creatinine, Ser: 0.75 mg/dL (ref 0.44–1.00)
GFR calc Af Amer: >60 mL/min (ref >60)
GFR calc non Af Amer: >60 mL/min (ref >60)
Glucose, Bld: 203 mg/dL — ABNORMAL HIGH (ref 70–99)
Potassium: 4.7 mmol/L (ref 3.5–5.1)
Sodium: 140 mmol/L (ref 135–145)

## 2019-06-10 LAB — TSH: TSH: 0.561 u[IU]/mL (ref 0.350–4.500)

## 2019-06-10 LAB — CBC
HCT: 39.9 % (ref 36.0–46.0)
Hemoglobin: 13.3 g/dL (ref 12.0–15.0)
MCH: 31.7 pg (ref 26.0–34.0)
MCHC: 33.3 g/dL (ref 30.0–36.0)
MCV: 95 fL (ref 80.0–100.0)
Platelets: 317 10*3/uL (ref 150–400)
RBC: 4.2 MIL/uL (ref 3.87–5.11)
RDW: 12.7 % (ref 11.5–15.5)
WBC: 20.9 10*3/uL — ABNORMAL HIGH (ref 4.0–10.5)
nRBC: 0 % (ref 0.0–0.2)

## 2019-06-10 LAB — LACTIC ACID, PLASMA: Lactic Acid, Venous: 1.4 mmol/L (ref 0.5–1.9)

## 2019-06-10 LAB — ETHANOL: Alcohol, Ethyl (B): <10 mg/dL (ref <10)

## 2019-06-10 LAB — I-STAT BETA HCG BLOOD, ED (MC, WL, AP ONLY): I-stat hCG, quantitative: <5 m[IU]/mL (ref <5)

## 2019-06-10 LAB — CBG MONITORING, ED: Glucose-Capillary: 219 mg/dL — ABNORMAL HIGH (ref 70–99)

## 2019-06-10 MED ORDER — CEPHALEXIN 500 MG PO CAPS
500.0000 mg | ORAL_CAPSULE | Freq: Three times a day (TID) | ORAL | 0 refills | Status: DC
Start: 2019-06-10 — End: 2019-08-08

## 2019-06-10 MED ORDER — SODIUM CHLORIDE 0.9 % IV BOLUS (SEPSIS)
1000.0000 mL | Freq: Once | INTRAVENOUS | Status: AC
  Administered 2019-06-10: 1000 mL via INTRAVENOUS

## 2019-06-10 MED ORDER — SODIUM CHLORIDE 0.9 % IV SOLN: 1000.0000 mL | INTRAVENOUS | Status: DC

## 2019-06-10 MED ORDER — SODIUM CHLORIDE 0.9 % IV SOLN: 1.0000 g | Freq: Once | INTRAVENOUS | Status: DC

## 2019-06-10 MED ORDER — SODIUM CHLORIDE 0.9 % IV SOLN
1.0000 g | Freq: Once | INTRAVENOUS | Status: AC
  Administered 2019-06-10: 17:00:00 1 g via INTRAVENOUS
  Filled 2019-06-10: qty 10

## 2019-06-10 NOTE — ED Notes (Signed)
Per Tomi Bamberger MD: Pt may take home insulin at this time.

## 2019-06-10 NOTE — ED Triage Notes (Addendum)
Per EMS, patient from home, reports taking one extra klonopin last night because she was feeling more anxious. C/o two falls today due to weakness. Denies LOC, neck and back pain. Hx bipolar.   18g L AC

## 2019-06-10 NOTE — ED Notes (Signed)
Pt ambulated with out assistance. MD at bedside

## 2019-06-10 NOTE — ED Provider Notes (Signed)
[facility] DEPT Provider Note   CSN: [id number] Arrival date & time: 06/10/19  1200     History Chief Complaint  Patient presents with  . Weakness    Lynn Barnes is a 59 y.o. female.  HPI   Pt states she fell out of bed last night.  She felt like she was weak.  Pt sttes she was lying on the floor for 5 hours.  Pt states her wife helped her up in the morning.  She fell again. Pt states she is still feeling weak in her legs.  It is hard for her to walk.  No fevers.  No vomiting. No trouble with speech.  No head injury.  No neck or back pain.  Pt states she took two clonazepam last night.  No other change in medications  No covid exposure.  Pt has been vaccinated Past Medical History:  Diagnosis Date  . 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 ([place])    opium  . Addiction, opium ([place])   . Allergy    spring  . Anxiety    ADHD CHRONIC ANXIETY  . 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 Bipolar disorder ([place]) 02/15/2011  . Cervical spondylosis without myelopathy   . Depression   . Depression   . Diabetes mellitus    sees Dr Altheimer/endo  . Dysautonomia ([place]) 10/24/2011  . Dyskinesia     subacute,due to drugs  . Dyslipidemia   . History of nuclear stress test    Myoview 11/16: EF 81%, no ischemia; Low Risk  . Hyperlipidemia   . IBS (irritable bowel syndrome) 02/15/2011  . Insomnia 02/21/2011  . Multinodular thyroid    with large dominant solid nodule with calcifications in the lower left pole benign by FNA in 1/12 and stable by repeat ulltrasound in 4/13  . Neuropathy    with paresthesias in feet  . Orthostatic hypotension   . Orthostatic hypotension   . 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 Parkinsonism ([place])   . POTS (postural orthostatic tachycardia syndrome)   . Radiculopathy    cervical  . Resting tremor   . Rotator cuff syndrome of left shoulder   . S/P partial hysterectomy   . Spondylosis without  myelopathy   . Subacute dyskinesia due to drugs(333.85)    dr dohmeier/neuro  . Tachycardia    syndrome  . Therapy    on therapy by psychiatrist using sebutex   . Thyroid nodule    s/p biopsy - benign approx jan 2012  . Tremor     Patient Active Problem List   Diagnosis Date Noted  . Posterior neck pain 09/23/2018  . Parkinsonism with orthostatic hypotension ([place]) 01/05/2018  . 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 Severe recurrent major depressive disorder with psychotic features ([place]) 04/26/2015  . Parkinsonian syndrome associated with symptomatic orthostatic hypotension ([place]) 04/26/2015  . 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 Allergic rhinitis 02/20/2015  . Regurgitation of food 08/17/2014  . Memory dysfunction 08/17/2014  . Lumbar degenerative disc disease 12/20/2013  . Greater trochanteric bursitis of right hip 03/23/2013  . 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 POTS (postural orthostatic tachycardia syndrome) 03/06/2012  . Subacute dyskinesia due to drugs(333.85) 10/24/2011  . ADD (attention deficit disorder) 02/23/2011  . Insomnia 02/21/2011  . Syncope 02/19/2011  . Parkinsonism ([place])   . Thyroid nodule   . Preventative health care  02/15/2011  . IBS (irritable bowel syndrome) 02/15/2011  . Bipolar disorder ([place]) 02/15/2011  . CERVICAL RADICULOPATHY, LEFT 02/08/2010  . DIABETIC PERIPHERAL NEUROPATHY 03/20/2009  . DRUG DEPENDENCE 03/20/2009  . UNSPECIFIED DRUG DEPENDENCE IN REMISSION 07/18/2008  . Anxiety state 11/11/2007  . Type 1 diabetes mellitus ([place]) 12/02/2006  . Hyperlipidemia 12/02/2006  . DEPRESSIVE DISORDER, RCR, MODERATE 12/02/2006    Past Surgical History:  Procedure Laterality Date  . ACROMIOPLASTY Left 2002   Dr Gladstone Lighter  . BREAST EXCISIONAL BIOPSY Right    benign  . BREAST LUMPECTOMY Right   . herniated disc cervical spine    . lower back  1992  . OOPHORECTOMY    . PARTIAL  HYSTERECTOMY    . SHOULDER SURGERY Left   . SPINE SURGERY  feb 2012   c5-6 fusion, Dr Rolena Infante  . THYROID LOBECTOMY  11/11   nodule biopsy, benign     OB History   No obstetric history on file.     Family History  Problem Relation Age of Onset  . Lung cancer Mother   . Heart disease Father   . Hypertension Father   . Pneumonia Father   . Heart disease Paternal Grandmother   . ALS Paternal Grandmother   . Hypertension Sister     Social History   Tobacco Use  . Smoking status: Never Smoker  . Smokeless tobacco: Never Used  Substance Use Topics  . Alcohol use: No    Alcohol/week: 0.0 standard drinks  . Drug use: No    Home Medications Prior to Admission medications   Medication Sig Start Date End Date Taking? Authorizing Provider  atorvastatin (LIPITOR) 40 MG tablet TAKE 1 TABLET BY MOUTH  DAILY Patient taking differently: Take 40 mg by mouth daily.  01/18/19  Yes Biagio Borg, MD  carbidopa-levodopa (SINEMET IR) 25-100 MG tablet TAKE 1 TABLET BY MOUTH 3  TIMES A DAY BEFORE MEALS Patient taking differently: Take 1 tablet by mouth 3 (three) times daily.  01/19/19  Yes Dohmeier, Asencion Partridge, MD  clonazePAM (KLONOPIN) 1 MG tablet Take 1 mg by mouth 3 (three) times daily. 03/25/18  Yes [provider]  fludrocortisone (FLORINEF) 0.1 MG tablet Take 0.5 tablets (50 mcg total) by mouth 2 (two) times daily. 11/19/18  Yes Fay Records, MD  Glucagon 3 MG/DOSE POWD Place into the nose. 01/28/18  Yes [provider]  HUMALOG KWIKPEN 100 UNIT/ML KwikPen INJECT BEFORE EACH MEAL WITH 1 UNIT PER 10 GM CARBS PLUS CORRECTIN OF 1 UNIT PER 50 MG 02/25/18  Yes [provider]  insulin degludec (TRESIBA FLEXTOUCH) 100 UNIT/ML SOPN FlexTouch Pen Inject 27 Units into the skin daily.  01/28/18  Yes [provider]  LATUDA 120 MG TABS Take 1 tablet by mouth at bedtime. 03/24/19  Yes [provider]  pindolol (VISKEN) 5 MG tablet Take 1 tablet (5 mg total) by mouth  2 (two) times daily. 01/19/19  Yes Fay Records, MD  risperidone (RISPERDAL) 4 MG tablet Take 8 mg by mouth at bedtime. 01/15/15  Yes [provider]  traMADol (ULTRAM) 50 MG tablet TAKE 1 TABLET(50 MG) BY MOUTH EVERY 6 HOURS AS NEEDED Patient taking differently: Take 50 mg by mouth every 6 (six) hours as needed for moderate pain.  04/14/19  Yes Biagio Borg, MD  zolpidem (AMBIEN) 10 MG tablet Take 10 mg by mouth at bedtime. 04/06/15  Yes [provider]  glucose blood test strip 1 each by Other route. 7  times per day   - as instructed    [provider]  meloxicam (MOBIC) 15 MG tablet 1 tab by mouth daily as needed for mild to mod pain Patient not taking: Reported on 06/10/2019 09/21/18   Biagio Borg, MD    Allergies    Artane [trihexyphenidyl], Nsaids, and Sulfonamide derivatives  Review of Systems   Review of Systems  All other systems reviewed and are negative.   Physical Exam Updated Vital Signs BP (!) 147/82   Pulse (!) 116   Temp 98.8 F (37.1 C) (Oral)   Resp (!) 22   SpO2 96%   Physical Exam Vitals and nursing note reviewed.  Constitutional:      General: She is not in acute distress.    Appearance: She is well-developed.  HENT:     Head: Normocephalic and atraumatic.     Right Ear: External ear normal.     Left Ear: External ear normal.  Eyes:     General: No scleral icterus.       Right eye: No discharge.        Left eye: No discharge.     Conjunctiva/sclera: Conjunctivae normal.  Neck:     Trachea: No tracheal deviation.  Cardiovascular:     Rate and Rhythm: Regular rhythm. Tachycardia present.  Pulmonary:     Effort: Pulmonary effort is normal. No respiratory distress.     Breath sounds: Normal breath sounds. No stridor. No wheezing or rales.  Abdominal:     General: Bowel sounds are normal. There is no distension.     Palpations: Abdomen is soft.     Tenderness: There is no abdominal tenderness. There is no guarding or rebound.    Musculoskeletal:        General: No tenderness.     Cervical back: Neck supple.  Skin:    General: Skin is warm and dry.     Findings: No rash.  Neurological:     Mental Status: She is alert.     Cranial Nerves: No cranial nerve deficit (no facial droop, extraocular movements intact, no slurred speech).     Sensory: No sensory deficit.     Motor: Tremor present. No abnormal muscle tone or seizure activity.     Coordination: Coordination normal.     Comments: Able to move all extremities, difficulty lifting legs off the bed, movements slow     ED Results / Procedures / Treatments   Labs (all labs ordered are listed, but only abnormal results are displayed) Labs Reviewed  BASIC METABOLIC PANEL - Abnormal; Notable for the following components:      Result Value   Glucose, Bld 203 (*)    BUN 24 (*)    Calcium 8.5 (*)    All other components within normal limits  CBC - Abnormal; Notable for the following components:   WBC 20.9 (*)    All other components within normal limits  URINALYSIS, ROUTINE W REFLEX MICROSCOPIC  I-STAT BETA HCG BLOOD, ED ([facility], [facility], [facility] ONLY)    EKG EKG Interpretation  Date/Time:  Friday June 10 2019 12:22:28 EDT Ventricular Rate:  120 PR Interval:    QRS Duration: 76 QT Interval:  326 QTC Calculation: 461 R Axis:   -133 Text Interpretation: Sinus tachycardia Paired ventricular premature complexes Aberrant complex Right axis deviation , new since last tracing Abnormal R-wave progression, early transition Abnormal T, consider ischemia, lateral leads Confirmed by Dorie Rank [phone]) on 06/10/2019 12:40:13 PM  Radiology No results found.  Procedures Procedures (including critical care time)  Medications Ordered in ED Medications - No data to display  ED Course  I have reviewed the triage vital signs and the nursing notes.  Pertinent labs & imaging results that were available during my care of the patient were reviewed by me and considered in my  medical decision making (see chart for details).  Clinical Course as of Jun 10 1702  Fri Jun 10, 2019  1440 CT shows suspicious thyroid mass.  Will also need follow up on cervical spine findings   [JK]  1703 BP remains normal.  Labs notable for elevated wbc.  Still tachycardic.  Will check lactic acid level, give a dose of rocephin.  Waiting for ua   [JK]  1704 Pt was able to get up and walk around the ED.  She feels like she is at her baseline   [JK]    Clinical Course User Index [JK] Dorie Rank, MD   MDM Rules/Calculators/A&P                      Pt presented with weakness falls.  WBC elevated.  No pneumonia.  No complains of headache to suggest meningitis, encephalitis. Pt is able to walk now in the ED.  Doubt acute myelitis or CNS event.    Will hydrate, give a dose of abx, while waiting for ua and lactic acid level with her wbc.  Care turned over to Dr Wilson Singer  Final Clinical Impression(s) / ED Diagnoses pending   Dorie Rank, MD 06/10/19 [phone]

## 2019-06-10 NOTE — ED Notes (Signed)
Labeled urine specimen and culture sent to lab. ENMiles 

## 2019-06-10 NOTE — ED Notes (Signed)
Pt os requesting to take home insulin. CBG 217. MD made aware. Awaiting orders.

## 2019-06-10 NOTE — Discharge Instructions (Addendum)
Follow up with your doctor to discuss further evaluation of the thyroid mass

## 2019-06-15 DIAGNOSIS — F3189 Other bipolar disorder: Secondary | ICD-10-CM | POA: Diagnosis not present

## 2019-06-17 DIAGNOSIS — E1042 Type 1 diabetes mellitus with diabetic polyneuropathy: Secondary | ICD-10-CM | POA: Diagnosis not present

## 2019-06-17 DIAGNOSIS — E782 Mixed hyperlipidemia: Secondary | ICD-10-CM | POA: Diagnosis not present

## 2019-06-17 DIAGNOSIS — E1065 Type 1 diabetes mellitus with hyperglycemia: Secondary | ICD-10-CM | POA: Diagnosis not present

## 2019-06-17 DIAGNOSIS — E1043 Type 1 diabetes mellitus with diabetic autonomic (poly)neuropathy: Secondary | ICD-10-CM | POA: Diagnosis not present

## 2019-06-20 DIAGNOSIS — Z794 Long term (current) use of insulin: Secondary | ICD-10-CM | POA: Diagnosis not present

## 2019-06-20 DIAGNOSIS — E1043 Type 1 diabetes mellitus with diabetic autonomic (poly)neuropathy: Secondary | ICD-10-CM | POA: Diagnosis not present

## 2019-06-20 DIAGNOSIS — E041 Nontoxic single thyroid nodule: Secondary | ICD-10-CM | POA: Diagnosis not present

## 2019-06-20 DIAGNOSIS — E1065 Type 1 diabetes mellitus with hyperglycemia: Secondary | ICD-10-CM | POA: Diagnosis not present

## 2019-06-20 DIAGNOSIS — E782 Mixed hyperlipidemia: Secondary | ICD-10-CM | POA: Diagnosis not present

## 2019-06-20 DIAGNOSIS — E103293 Type 1 diabetes mellitus with mild nonproliferative diabetic retinopathy without macular edema, bilateral: Secondary | ICD-10-CM | POA: Diagnosis not present

## 2019-06-20 DIAGNOSIS — E1042 Type 1 diabetes mellitus with diabetic polyneuropathy: Secondary | ICD-10-CM | POA: Diagnosis not present

## 2019-06-20 DIAGNOSIS — I498 Other specified cardiac arrhythmias: Secondary | ICD-10-CM | POA: Diagnosis not present

## 2019-06-20 DIAGNOSIS — E042 Nontoxic multinodular goiter: Secondary | ICD-10-CM | POA: Diagnosis not present

## 2019-06-20 DIAGNOSIS — Z9641 Presence of insulin pump (external) (internal): Secondary | ICD-10-CM | POA: Diagnosis not present

## 2019-07-06 DIAGNOSIS — E1065 Type 1 diabetes mellitus with hyperglycemia: Secondary | ICD-10-CM | POA: Diagnosis not present

## 2019-08-08 ENCOUNTER — Ambulatory Visit: Payer: Medicare PPO | Admitting: Adult Health

## 2019-08-08 ENCOUNTER — Encounter: Payer: Self-pay | Admitting: Adult Health

## 2019-08-08 VITALS — BP 120/73 | HR 92 | Ht 67.0 in | Wt 134.0 lb

## 2019-08-08 DIAGNOSIS — G2119 Other drug induced secondary parkinsonism: Secondary | ICD-10-CM

## 2019-08-08 NOTE — Progress Notes (Signed)
PATIENT: Lynn Barnes DOB: 1960/06/23  REASON FOR VISIT: follow up HISTORY FROM: patient  HISTORY OF PRESENT ILLNESS: Today 08/08/19:  Lynn Barnes is a 59 year old female with a history of drug-induced parkinsonism.  She returns today for follow-up.  She remains on Sinemet 1 tablet 3 times a day.  Reports that this controls her tremor.  Denies any significant changes with her gait or balance.  Reports that she did have a fall approximately 6 weeks ago but fortunately no injuries.  She denies falling frequently.  She does not use an assistive device when ambulating.  No trouble chewing or swallowing.  She returns today for an evaluation.  HISTORY  10/06/18 Rv with Lynn Seat, MD, routine Rv.  No further falls, no panic attacks, anxiety reportedly well controlled.   DM type 1 childhood onset- with a dysautonomia and neuropathy. POTS followed by Lynn Carnes, MD  Tremors have responded very well to Sinemet( start in 2017)  and after she took a "break' from carbidopa levodopa she noted how severe the tremor was when untreated.  12-2017 DAT scan negative for primary parkinson's disease.   2-20202 Lynn Presto, NP  Lynn Barnes is a 59 y.o. 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  She was started on Sinemet about 2 years ago.  Sinemet helps but she still have some trouble walking and a tremor. She feels tremor is worse with activity like writing or drinking out of a cup.  She is able to perform all ADLs.  She does feel that she still has some trouble walking.  She has not had any falls.  She does not use any assistive devices.  DaTscan in November did not reveal any primary Parkinson's disease.  She is seen every three months by her psychiatrist, Lynn Barnes.  She continues Latuda, Risperdal, Valium and Ambien prescribed.  She reports that she continues to struggle with some anxiety but feels that she is stable overall.  She is seeing Lynn Barnes for POTS  and reports that symptoms are controlled. She is taking Pindolol 5mg  BID.    REVIEW OF SYSTEMS: Out of a complete 14 system review of symptoms, the patient complains only of the following symptoms, and all other reviewed systems are negative.  See HPI  ALLERGIES: Allergies  Allergen Reactions  . Artane [Trihexyphenidyl]     Made tremors worse.   . Nsaids Other (See Comments)    Irritates stomach  . Sulfonamide Derivatives Rash    HOME MEDICATIONS: Outpatient Medications Prior to Visit  Medication Sig Dispense Refill  . atorvastatin (LIPITOR) 40 MG tablet TAKE 1 TABLET BY MOUTH  DAILY (Patient taking differently: Take 40 mg by mouth daily. ) 90 tablet 3  . carbidopa-levodopa (SINEMET IR) 25-100 MG tablet TAKE 1 TABLET BY MOUTH 3  TIMES A DAY BEFORE MEALS (Patient taking differently: Take 1 tablet by mouth 3 (three) times daily. ) 270 tablet 1  . fludrocortisone (FLORINEF) 0.1 MG tablet Take 0.5 tablets (50 mcg total) by mouth 2 (two) times daily. 90 tablet 3  . Glucagon 3 MG/DOSE POWD Place into the nose.    [name] glucose blood test strip 1 each by Other route. 7 times per day   - as instructed    . HUMALOG KWIKPEN 100 UNIT/ML KwikPen INJECT BEFORE EACH MEAL WITH 1 UNIT PER 10 GM CARBS PLUS CORRECTIN OF 1 UNIT PER 50 MG    . insulin degludec (TRESIBA FLEXTOUCH) 100 UNIT/ML  SOPN FlexTouch Pen Inject 27 Units into the skin daily.     [name] LATUDA 120 MG TABS Take 1 tablet by mouth at bedtime.    . pindolol (VISKEN) 5 MG tablet Take 1 tablet (5 mg total) by mouth 2 (two) times daily. 180 tablet 2  . risperidone (RISPERDAL) 4 MG tablet Take 8 mg by mouth at bedtime.  0  . traMADol (ULTRAM) 50 MG tablet TAKE 1 TABLET(50 MG) BY MOUTH EVERY 6 HOURS AS NEEDED (Patient taking differently: Take 50 mg by mouth every 6 (six) hours as needed for moderate pain. ) 120 tablet 2  . zolpidem (AMBIEN) 10 MG tablet Take 10 mg by mouth at bedtime.  1  . cephALEXin (KEFLEX) 500 MG capsule Take 1 capsule (500 mg  total) by mouth 3 (three) times daily. 15 capsule 0  . clonazePAM (KLONOPIN) 1 MG tablet Take 1 mg by mouth 3 (three) times daily.    . meloxicam (MOBIC) 15 MG tablet 1 tab by mouth daily as needed for mild to mod pain (Patient not taking: Reported on 06/10/2019) 90 tablet 3   No facility-administered medications prior to visit.    PAST MEDICAL HISTORY: Past Medical History:  Diagnosis Date  . 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 ([place])    opium  . Addiction, opium ([place])   . Allergy    spring  . Anxiety    ADHD CHRONIC ANXIETY  . 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 Bipolar disorder ([place]) 02/15/2011  . Cervical spondylosis without myelopathy   . Depression   . Depression   . Diabetes mellitus    sees Lynn Altheimer/endo  . Dysautonomia ([place]) 10/24/2011  . Dyskinesia     subacute,due to drugs  . Dyslipidemia   . History of nuclear stress test    Myoview 11/16: EF 81%, no ischemia; Low Risk  . Hyperlipidemia   . IBS (irritable bowel syndrome) 02/15/2011  . Insomnia 02/21/2011  . Multinodular thyroid    with large dominant solid nodule with calcifications in the lower left pole benign by FNA in 1/12 and stable by repeat ulltrasound in 4/13  . Neuropathy    with paresthesias in feet  . Orthostatic hypotension   . Orthostatic hypotension   . 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 Parkinsonism ([place])   . POTS (postural orthostatic tachycardia syndrome)   . Radiculopathy    cervical  . Resting tremor   . Rotator cuff syndrome of left shoulder   . S/P partial hysterectomy   . Spondylosis without myelopathy   . Subacute dyskinesia due to drugs(333.85)    Lynn dohmeier/neuro  . Tachycardia    syndrome  . Therapy    on therapy by psychiatrist using sebutex   . Thyroid nodule    s/p biopsy - benign approx jan 2012  . Tremor     PAST SURGICAL HISTORY: Past Surgical History:  Procedure Laterality Date  . ACROMIOPLASTY Left 2002   Lynn Barnes  . BREAST  EXCISIONAL BIOPSY Right    benign  . BREAST LUMPECTOMY Right   . herniated disc cervical spine    . lower back  1992  . OOPHORECTOMY    . PARTIAL HYSTERECTOMY    . SHOULDER SURGERY Left   . SPINE SURGERY  feb 2012   c5-6 fusion, Lynn Rolena Infante  . THYROID LOBECTOMY  11/11   nodule biopsy, benign    FAMILY HISTORY: Family History  Problem Relation Age  of Onset  . Lung cancer Mother   . Heart disease Father   . Hypertension Father   . Pneumonia Father   . Heart disease Paternal Grandmother   . ALS Paternal Grandmother   . Hypertension Sister     SOCIAL HISTORY: Social History   Socioeconomic History  . Marital status: Married    Spouse name: Not on file  . Number of children: 0  . Years of education: BA  . Highest education level: Not on file  Occupational History  . Occupation: Product manager: [employer]  Tobacco Use  . Smoking status: Never Smoker  . Smokeless tobacco: Never Used  Vaping Use  . Vaping Use: Never used  Substance and Sexual Activity  . Alcohol use: No    Alcohol/week: 0.0 standard drinks  . Drug use: No  . Sexual activity: Never  Other Topics Concern  . Not on file  Social History Narrative   Patient is single and lives with my partner ([place]).   Patient is disabled.   Patient is right-handed.   Patient has a Haematologist.   Patient drinks one soda every other day.   Social Determinants of Health   Financial Resource Strain:   . Difficulty of Paying Living Expenses:   Food Insecurity:   . Worried About Charity fundraiser in the Last Year:   . Arboriculturist in the Last Year:   Transportation Needs:   . Film/video editor (Medical):   [name] Lack of Transportation (Non-Medical):   Physical Activity:   . Days of Exercise per Week:   . Minutes of Exercise per Session:   Stress:   . Feeling of Stress :   Social Connections:   . Frequency of Communication with Friends and Family:   . Frequency of Social  Gatherings with Friends and Family:   . Attends Religious Services:   . Active Member of Clubs or Organizations:   . Attends Archivist Meetings:   [name] Marital Status:   Intimate Partner Violence:   . Fear of Current or Ex-Partner:   . Emotionally Abused:   [name] Physically Abused:   . Sexually Abused:       PHYSICAL EXAM  Vitals:   08/08/19 1343  BP: 120/73  Pulse: 92  Weight: 134 lb (60.8 kg)  Barnes: 5\' 7"  (1.702 m)   Body mass index is 20.99 kg/m.  Generalized: Well developed, in no acute distress   Neurological examination  Mentation: Alert oriented to time, place, history taking. Follows all commands speech and language fluent Cranial nerve II-XII: Pupils were equal round reactive to light. Extraocular movements were full, visual field were full on confrontational test. Head turning and shoulder shrug  were normal and symmetric. Motor: The motor testing reveals 5 over 5 strength of all 4 extremities. Good symmetric motor tone is noted throughout.  Sensory: Sensory testing is intact to soft touch on all 4 extremities. No evidence of extinction is noted.  Coordination: Cerebellar testing reveals good finger-nose-finger and heel-to-shin bilaterally.  Gait and station: Gait is normal.  Reflexes: Deep tendon reflexes are symmetric and normal bilaterally.   DIAGNOSTIC DATA (LABS, IMAGING, TESTING) - I reviewed patient records, labs, notes, testing and imaging myself where available.  Lab Results  Component Value Date   WBC 20.9 (H) 06/10/2019   HGB 13.3 06/10/2019   HCT 39.9 06/10/2019   MCV 95.0 06/10/2019   PLT 317 06/10/2019  Component Value Date/Time   NA 140 06/10/2019 1216   NA 144 01/05/2018 1125   K 4.7 06/10/2019 1216   CL 106 06/10/2019 1216   CO2 25 06/10/2019 1216   GLUCOSE 203 (H) 06/10/2019 1216   BUN 24 (H) 06/10/2019 1216   BUN 19 01/05/2018 1125   CREATININE 0.75 06/10/2019 1216   CREATININE 0.87 07/06/2015 1523   CALCIUM 8.5 (L)  06/10/2019 1216   PROT 6.3 09/21/2018 1414   PROT 6.0 01/05/2018 1125   ALBUMIN 3.9 09/21/2018 1414   ALBUMIN 3.9 01/05/2018 1125   AST 13 09/21/2018 1414   ALT 11 09/21/2018 1414   ALKPHOS 63 09/21/2018 1414   BILITOT 0.5 09/21/2018 1414   BILITOT 0.4 01/05/2018 1125   GFRNONAA >60 06/10/2019 1216   GFRAA >60 06/10/2019 1216   Lab Results  Component Value Date   CHOL 169 09/21/2018   HDL 56.40 09/21/2018   LDLCALC 99 09/21/2018   TRIG 67.0 09/21/2018   CHOLHDL 3 09/21/2018   Lab Results  Component Value Date   HGBA1C 8.3 (H) 09/21/2018   Lab Results  Component Value Date   KPQAESLP53 005 09/21/2018   Lab Results  Component Value Date   TSH 0.561 06/10/2019      ASSESSMENT AND PLAN 59 y.o. 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 ([place]), Bipolar disorder ([place]) (02/15/2011), Cervical spondylosis without myelopathy, Depression, Depression, Diabetes mellitus, Dysautonomia ([place]) (10/24/2011), Dyskinesia, Dyslipidemia, History of nuclear stress test, Hyperlipidemia, IBS (irritable bowel syndrome) (02/15/2011), Insomnia (02/21/2011), Multinodular thyroid, Neuropathy, Orthostatic hypotension, Orthostatic hypotension, Parkinsonism ([place]), Parkinsonism ([place]), POTS (postural orthostatic tachycardia syndrome), Radiculopathy, Resting tremor, Rotator cuff syndrome of left shoulder, S/P partial hysterectomy, Spondylosis without myelopathy, Subacute dyskinesia due to drugs(333.85), Tachycardia, Therapy, Thyroid nodule, and Tremor. here with :  Drug-induced parkinsonism   Continue Sinemet 3 times a day  Advised if symptoms worsen or she develops new symptoms she was advised to let us know  Follow-up in 1 year or sooner if needed   I spent 20 minutes of face-to-face and non-face-to-face time with patient.  This included previsit chart review, lab review, study review, order entry,  electronic health record documentation, patient education.  Ward Givens, MSN, NP-C 08/08/2019, 1:43 PM [facility] [address] [place], [place] [address] [phone]

## 2019-08-08 NOTE — Patient Instructions (Signed)
Your Plan:  Continue Sinemet If your symptoms worsen or you develop new symptoms please let us know.   Thank you for coming to see us at [facility]. I hope we have been able to provide you high quality care today.  You may receive a patient satisfaction survey over the next few weeks. We would appreciate your feedback and comments so that we may continue to improve ourselves and the health of our patients.  

## 2019-08-09 DIAGNOSIS — F411 Generalized anxiety disorder: Secondary | ICD-10-CM | POA: Diagnosis not present

## 2019-08-09 DIAGNOSIS — F3189 Other bipolar disorder: Secondary | ICD-10-CM | POA: Diagnosis not present

## 2019-09-07 ENCOUNTER — Other Ambulatory Visit: Payer: Self-pay | Admitting: Internal Medicine

## 2019-09-07 NOTE — Telephone Encounter (Signed)
1.Medication Requested:traMADol (ULTRAM) 50 MG tablet  2. Pharmacy (Name, Street, City):[facility] [phone] - [place], [address]  3. On Med List: yes  4. Last Visit with PCP: 8.11.20   5. Next visit date with PCP: 8.12.21    Agent: Please be advised that RX refills may take up to 3 business days. We ask that you follow-up with your pharmacy.

## 2019-09-07 NOTE — Telephone Encounter (Signed)
Tramadol done erx for 2 mo tx  Ok to let pt know - needs ROV for further refills after this

## 2019-09-15 DIAGNOSIS — F3189 Other bipolar disorder: Secondary | ICD-10-CM | POA: Diagnosis not present

## 2019-09-22 ENCOUNTER — Encounter: Payer: Medicare Other | Admitting: Internal Medicine

## 2019-09-24 DIAGNOSIS — E1065 Type 1 diabetes mellitus with hyperglycemia: Secondary | ICD-10-CM | POA: Diagnosis not present

## 2019-10-11 ENCOUNTER — Ambulatory Visit (INDEPENDENT_AMBULATORY_CARE_PROVIDER_SITE_OTHER): Payer: Medicare PPO | Admitting: Internal Medicine

## 2019-10-11 ENCOUNTER — Other Ambulatory Visit: Payer: Self-pay

## 2019-10-11 ENCOUNTER — Encounter: Payer: Self-pay | Admitting: Internal Medicine

## 2019-10-11 VITALS — BP 120/80 | HR 83 | Temp 98.2°F | Ht 67.0 in | Wt 134.0 lb

## 2019-10-11 DIAGNOSIS — Z23 Encounter for immunization: Secondary | ICD-10-CM | POA: Diagnosis not present

## 2019-10-11 DIAGNOSIS — Z1211 Encounter for screening for malignant neoplasm of colon: Secondary | ICD-10-CM | POA: Diagnosis not present

## 2019-10-11 DIAGNOSIS — E1042 Type 1 diabetes mellitus with diabetic polyneuropathy: Secondary | ICD-10-CM | POA: Diagnosis not present

## 2019-10-11 DIAGNOSIS — Z0000 Encounter for general adult medical examination without abnormal findings: Secondary | ICD-10-CM

## 2019-10-11 NOTE — Patient Instructions (Addendum)
We will sign you up for the Cologuard testing, and you get the box in the mail  Please call for your yearly mammogram to [place] had the flu shot today  Please continue all other medications as before, and refills have been done if requested.  Please have the pharmacy call with any other refills you may need.  Please continue your efforts at being more active, low cholesterol diet, and weight control.  You are otherwise up to date with prevention measures today.  Please keep your appointments with your specialists as you may have planned  Please go to the LAB at the blood drawing area for the tests to be done  You will be contacted by phone if any changes need to be made immediately.  Otherwise, you will receive a letter about your results with an explanation, but please check with MyChart first.  Please make an Appointment to return for your 1 year visit, or sooner if needed

## 2019-10-11 NOTE — Progress Notes (Addendum)
Subjective:    Patient ID: Lynn Barnes, female    DOB: 1960/10/27, 59 y.o.   MRN: [id number]  HPI  Here for wellness and f/u;  Overall doing ok;  Pt denies Chest pain, worsening SOB, DOE, wheezing, orthopnea, PND, worsening LE edema, palpitations, dizziness or syncope.  Pt denies neurological change such as new headache, facial or extremity weakness.  Pt denies polydipsia, polyuria, or low sugar symptoms. Pt states overall good compliance with treatment and medications, good tolerability, and has been trying to follow appropriate diet.  Pt denies worsening depressive symptoms, suicidal ideation or panic. No fever, night sweats, wt loss, loss of appetite, or other constitutional symptoms.  Pt states good ability with ADL's, has low fall risk, home safety reviewed and adequate, no other significant changes in hearing or vision, and only occasionally active with exercise.  Pt plans to call soon for eye doctor appt.   April 2021 uti treated Past Medical History:  Diagnosis Date  . 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 ([place])    opium  . Addiction, opium ([place])   . Allergy    spring  . Anxiety    ADHD CHRONIC ANXIETY  . 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 Bipolar disorder ([place]) 02/15/2011  . Cervical spondylosis without myelopathy   . Depression   . Depression   . Diabetes mellitus    sees Dr Altheimer/endo  . Dysautonomia ([place]) 10/24/2011  . Dyskinesia     subacute,due to drugs  . Dyslipidemia   . History of nuclear stress test    Myoview 11/16: EF 81%, no ischemia; Low Risk  . Hyperlipidemia   . IBS (irritable bowel syndrome) 02/15/2011  . Insomnia 02/21/2011  . Multinodular thyroid    with large dominant solid nodule with calcifications in the lower left pole benign by FNA in 1/12 and stable by repeat ulltrasound in 4/13  . Neuropathy    with paresthesias in feet  . Orthostatic hypotension   . Orthostatic hypotension   . 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 Parkinsonism ([place])   . POTS (postural orthostatic tachycardia syndrome)   . Radiculopathy    cervical  . Resting tremor   . Rotator cuff syndrome of left shoulder   . S/P partial hysterectomy   . Spondylosis without myelopathy   . Subacute dyskinesia due to drugs(333.85)    dr dohmeier/neuro  . Tachycardia    syndrome  . Therapy    on therapy by psychiatrist using sebutex   . Thyroid nodule    s/p biopsy - benign approx jan 2012  . Tremor    Past Surgical History:  Procedure Laterality Date  . ACROMIOPLASTY Left 2002   Dr Gladstone Lighter  . BREAST EXCISIONAL BIOPSY Right    benign  . BREAST LUMPECTOMY Right   . herniated disc cervical spine    . lower back  1992  . OOPHORECTOMY    . PARTIAL HYSTERECTOMY    . SHOULDER SURGERY Left   . SPINE SURGERY  feb 2012   c5-6 fusion, Dr Rolena Infante  . THYROID LOBECTOMY  11/11   nodule biopsy, benign    reports that she has never smoked. She has never used smokeless tobacco. She reports that she does not drink alcohol and does not use drugs. family history includes ALS in her paternal grandmother; Heart disease in her father and paternal grandmother; Hypertension in her father and sister; Lung cancer in her mother; Pneumonia in  her father. Allergies  Allergen Reactions  . Artane [Trihexyphenidyl]     Made tremors worse.   [name] Klonopin [Clonazepam] Other (See Comments)    Severe weakness  . Nsaids Other (See Comments)    Irritates stomach  . Sulfonamide Derivatives Rash   Current Outpatient Medications on File Prior to Visit  Medication Sig Dispense Refill  . atorvastatin (LIPITOR) 40 MG tablet TAKE 1 TABLET BY MOUTH  DAILY (Patient taking differently: Take 40 mg by mouth daily. ) 90 tablet 3  . busPIRone (BUSPAR) 10 MG tablet Take 10 mg by mouth 2 (two) times daily.    . carbidopa-levodopa (SINEMET IR) 25-100 MG tablet TAKE 1 TABLET BY MOUTH 3  TIMES A DAY BEFORE MEALS (Patient taking differently: Take 1 tablet by mouth 3 (three)  times daily. ) 270 tablet 1  . fludrocortisone (FLORINEF) 0.1 MG tablet Take 0.5 tablets (50 mcg total) by mouth 2 (two) times daily. 90 tablet 3  . Glucagon 3 MG/DOSE POWD Place into the nose.    [name] glucose blood test strip 1 each by Other route. 7 times per day   - as instructed    . HUMALOG KWIKPEN 100 UNIT/ML KwikPen INJECT BEFORE EACH MEAL WITH 1 UNIT PER 10 GM CARBS PLUS CORRECTIN OF 1 UNIT PER 50 MG    . insulin degludec (TRESIBA FLEXTOUCH) 100 UNIT/ML SOPN FlexTouch Pen Inject 27 Units into the skin daily.     [name] lamoTRIgine (LAMICTAL) 200 MG tablet Take 200 mg by mouth at bedtime.    [name] LATUDA 120 MG TABS Take 1 tablet by mouth at bedtime.    . pindolol (VISKEN) 5 MG tablet Take 1 tablet (5 mg total) by mouth 2 (two) times daily. 180 tablet 2  . risperidone (RISPERDAL) 4 MG tablet Take 8 mg by mouth at bedtime.  0  . traMADol (ULTRAM) 50 MG tablet Take 1 tablet (50 mg total) by mouth every 6 (six) hours as needed for moderate pain. 120 tablet 1  . zolpidem (AMBIEN) 10 MG tablet Take 10 mg by mouth at bedtime.  1   No current facility-administered medications on file prior to visit.   Review of Systems All otherwise neg per pt     Objective:   Physical Exam BP 120/80 (BP Location: Left Arm, Patient Position: Sitting, Cuff Size: Large)   Pulse 83   Temp 98.2 F (36.8 C) (Oral)   Ht 5\' 7"  (1.702 m)   Wt 134 lb (60.8 kg)   SpO2 94%   BMI 20.99 kg/m  VS noted,  Constitutional: Pt appears in NAD HENT: Head: NCAT.  Right Ear: External ear normal.  Left Ear: External ear normal.  Eyes: . Pupils are equal, round, and reactive to light. Conjunctivae and EOM are normal Nose: without d/c or deformity Neck: Neck supple. Gross normal ROM Cardiovascular: Normal rate and regular rhythm.   Pulmonary/Chest: Effort normal and breath sounds without rales or wheezing.  Abd:  Soft, NT, ND, + BS, no organomegaly Neurological: Pt is alert. At baseline orientation, motor grossly intact Skin:  Skin is warm. No rashes, other new lesions, no LE edema Psychiatric: Pt behavior is normal without agitation  ,All otherwise neg per pt Lab Results  Component Value Date   WBC 20.9 (H) 06/10/2019   HGB 13.3 06/10/2019   HCT 39.9 06/10/2019   PLT 317 06/10/2019   GLUCOSE 203 (H) 06/10/2019   CHOL 169 09/21/2018   TRIG 67.0 09/21/2018   HDL 56.40 09/21/2018  LDLCALC 99 09/21/2018   ALT 11 09/21/2018   AST 13 09/21/2018   NA 140 06/10/2019   K 4.7 06/10/2019   CL 106 06/10/2019   CREATININE 0.75 06/10/2019   BUN 24 (H) 06/10/2019   CO2 25 06/10/2019   TSH 0.561 06/10/2019   HGBA1C 8.3 (H) 09/21/2018   MICROALBUR 2.5 (H) 09/21/2018      Assessment & Plan:

## 2019-10-13 DIAGNOSIS — F3189 Other bipolar disorder: Secondary | ICD-10-CM | POA: Diagnosis not present

## 2019-10-16 ENCOUNTER — Encounter: Payer: Self-pay | Admitting: Internal Medicine

## 2019-10-16 NOTE — Assessment & Plan Note (Signed)

## 2019-10-16 NOTE — Assessment & Plan Note (Signed)
Cont endo f/u

## 2019-10-21 ENCOUNTER — Ambulatory Visit: Payer: Medicare PPO | Admitting: Internal Medicine

## 2019-10-25 ENCOUNTER — Ambulatory Visit: Payer: Medicare PPO | Admitting: Podiatry

## 2019-10-25 DIAGNOSIS — E1065 Type 1 diabetes mellitus with hyperglycemia: Secondary | ICD-10-CM | POA: Diagnosis not present

## 2019-10-25 DIAGNOSIS — E782 Mixed hyperlipidemia: Secondary | ICD-10-CM | POA: Diagnosis not present

## 2019-10-27 ENCOUNTER — Other Ambulatory Visit: Payer: Self-pay | Admitting: Neurology

## 2019-10-27 DIAGNOSIS — E782 Mixed hyperlipidemia: Secondary | ICD-10-CM | POA: Diagnosis not present

## 2019-10-27 DIAGNOSIS — E1065 Type 1 diabetes mellitus with hyperglycemia: Secondary | ICD-10-CM | POA: Diagnosis not present

## 2019-10-27 DIAGNOSIS — F329 Major depressive disorder, single episode, unspecified: Secondary | ICD-10-CM | POA: Diagnosis not present

## 2019-10-27 DIAGNOSIS — E041 Nontoxic single thyroid nodule: Secondary | ICD-10-CM | POA: Diagnosis not present

## 2019-10-27 DIAGNOSIS — E1042 Type 1 diabetes mellitus with diabetic polyneuropathy: Secondary | ICD-10-CM | POA: Diagnosis not present

## 2019-10-27 DIAGNOSIS — I951 Orthostatic hypotension: Secondary | ICD-10-CM | POA: Diagnosis not present

## 2019-10-27 DIAGNOSIS — F909 Attention-deficit hyperactivity disorder, unspecified type: Secondary | ICD-10-CM | POA: Diagnosis not present

## 2019-10-27 DIAGNOSIS — G20 Parkinson's disease: Secondary | ICD-10-CM | POA: Diagnosis not present

## 2019-10-27 DIAGNOSIS — E10319 Type 1 diabetes mellitus with unspecified diabetic retinopathy without macular edema: Secondary | ICD-10-CM | POA: Diagnosis not present

## 2019-11-07 ENCOUNTER — Other Ambulatory Visit: Payer: Self-pay | Admitting: Internal Medicine

## 2019-11-07 ENCOUNTER — Telehealth: Payer: Self-pay | Admitting: Internal Medicine

## 2019-11-07 NOTE — Telephone Encounter (Signed)
Sent to Dr. John. 

## 2019-11-07 NOTE — Telephone Encounter (Signed)
rx refill has been sent to Dr. Jenny Reichmann.

## 2019-11-07 NOTE — Telephone Encounter (Signed)
Done erx just now

## 2019-11-07 NOTE — Telephone Encounter (Signed)
Patient called and was requesting a medication refill for traMADol (ULTRAM) 50 MG tablet It can be sent to [facility] #[id number] - [place], [address] AT [address]

## 2019-11-10 DIAGNOSIS — M205X1 Other deformities of toe(s) (acquired), right foot: Secondary | ICD-10-CM | POA: Diagnosis not present

## 2019-11-10 DIAGNOSIS — M205X2 Other deformities of toe(s) (acquired), left foot: Secondary | ICD-10-CM | POA: Diagnosis not present

## 2019-11-10 DIAGNOSIS — M21611 Bunion of right foot: Secondary | ICD-10-CM | POA: Diagnosis not present

## 2019-11-10 DIAGNOSIS — L602 Onychogryphosis: Secondary | ICD-10-CM | POA: Diagnosis not present

## 2019-11-12 DIAGNOSIS — Z1231 Encounter for screening mammogram for malignant neoplasm of breast: Secondary | ICD-10-CM | POA: Diagnosis not present

## 2019-11-14 ENCOUNTER — Other Ambulatory Visit: Payer: Self-pay | Admitting: Internal Medicine

## 2019-11-15 ENCOUNTER — Other Ambulatory Visit: Payer: Self-pay

## 2019-11-15 MED ORDER — FLUDROCORTISONE ACETATE 0.1 MG PO TABS: 50.0000 ug | ORAL_TABLET | Freq: Two times a day (BID) | ORAL | 0 refills | Status: DC

## 2019-11-15 NOTE — Telephone Encounter (Signed)
Pt's medication was sent to pt's pharmacy as requested. Confirmation received.  °

## 2019-11-22 DIAGNOSIS — F3189 Other bipolar disorder: Secondary | ICD-10-CM | POA: Diagnosis not present

## 2019-11-24 DIAGNOSIS — H2513 Age-related nuclear cataract, bilateral: Secondary | ICD-10-CM | POA: Diagnosis not present

## 2019-11-24 DIAGNOSIS — E103292 Type 1 diabetes mellitus with mild nonproliferative diabetic retinopathy without macular edema, left eye: Secondary | ICD-10-CM | POA: Diagnosis not present

## 2019-11-24 DIAGNOSIS — H40013 Open angle with borderline findings, low risk, bilateral: Secondary | ICD-10-CM | POA: Diagnosis not present

## 2019-11-24 DIAGNOSIS — H04223 Epiphora due to insufficient drainage, bilateral lacrimal glands: Secondary | ICD-10-CM | POA: Diagnosis not present

## 2019-11-24 LAB — HM DIABETES EYE EXAM

## 2019-12-08 ENCOUNTER — Encounter: Payer: Self-pay | Admitting: Internal Medicine

## 2019-12-21 NOTE — Progress Notes (Signed)
Cardiology Office Note   Date:  12/22/2019   ID:  Lynn Barnes, DOB [birth year], MRN [id number]  PCP:  Biagio Borg, MD  Cardiologist:   Dorris Carnes, MD    Pt returns for f/u of autonomic dysfunction.   History of Present Illness: Lynn Barnes is a 59 y.o. female with a history of parkinsons and autonomic dysfunciton   I saw her in  The fall of 2020  Since seen she says she has been doing pretty good   She did have a syncopal spell about 1 month ago  Says she is drinknig   Appetite is OK    Current Meds  Medication Sig  . atorvastatin (LIPITOR) 40 MG tablet TAKE 1 TABLET BY MOUTH  DAILY (Patient taking differently: Take 40 mg by mouth daily. )  . busPIRone (BUSPAR) 10 MG tablet Take 10 mg by mouth 2 (two) times daily.  . carbidopa-levodopa (SINEMET IR) 25-100 MG tablet TAKE 1 TABLET BY MOUTH THREE TIMES DAILY BEFORE MEALS  . fludrocortisone (FLORINEF) 0.1 MG tablet Take 0.5 tablets (50 mcg total) by mouth 2 (two) times daily. Please keep upcoming appt in November with Dr. Harrington Challenger before anymore refills. Thank you  . Glucagon 3 MG/DOSE POWD Place into the nose.  [name] glucose blood test strip 1 each by Other route. 7 times per day   - as instructed  . HUMALOG KWIKPEN 100 UNIT/ML KwikPen INJECT BEFORE EACH MEAL WITH 1 UNIT PER 10 GM CARBS PLUS CORRECTIN OF 1 UNIT PER 50 MG  . insulin degludec (TRESIBA FLEXTOUCH) 100 UNIT/ML SOPN FlexTouch Pen Inject 27 Units into the skin daily.   [name] LATUDA 120 MG TABS Take 1 tablet by mouth at bedtime.  . pindolol (VISKEN) 5 MG tablet Take 1 tablet (5 mg total) by mouth 2 (two) times daily.  . risperidone (RISPERDAL) 4 MG tablet Take 8 mg by mouth at bedtime.  . traMADol (ULTRAM) 50 MG tablet TAKE 1 TABLET(50 MG) BY MOUTH EVERY 6 HOURS AS NEEDED FOR MODERATE PAIN  . zolpidem (AMBIEN) 10 MG tablet Take 10 mg by mouth at bedtime.     Allergies:   Artane [trihexyphenidyl], Klonopin [clonazepam], Nsaids, and Sulfonamide derivatives   Past Medical  History:  Diagnosis Date  . 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 ([place])    opium  . Addiction, opium ([place])   . Allergy    spring  . Anxiety    ADHD CHRONIC ANXIETY  . 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 Bipolar disorder ([place]) 02/15/2011  . Cervical spondylosis without myelopathy   . Depression   . Depression   . Diabetes mellitus    sees Dr Altheimer/endo  . Dysautonomia ([place]) 10/24/2011  . Dyskinesia     subacute,due to drugs  . Dyslipidemia   . History of nuclear stress test    Myoview 11/16: EF 81%, no ischemia; Low Risk  . Hyperlipidemia   . IBS (irritable bowel syndrome) 02/15/2011  . Insomnia 02/21/2011  . Multinodular thyroid    with large dominant solid nodule with calcifications in the lower left pole benign by FNA in 1/12 and stable by repeat ulltrasound in 4/13  . Neuropathy    with paresthesias in feet  . Orthostatic hypotension   . Orthostatic hypotension   . 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 Parkinsonism ([place])   . POTS (postural orthostatic tachycardia syndrome)   . Radiculopathy    cervical  .  Resting tremor   . Rotator cuff syndrome of left shoulder   . S/P partial hysterectomy   . Spondylosis without myelopathy   . Subacute dyskinesia due to drugs(333.85)    dr dohmeier/neuro  . Tachycardia    syndrome  . Therapy    on therapy by psychiatrist using sebutex   . Thyroid nodule    s/p biopsy - benign approx jan 2012  . Tremor     Past Surgical History:  Procedure Laterality Date  . ACROMIOPLASTY Left 2002   Dr Gladstone Lighter  . BREAST EXCISIONAL BIOPSY Right    benign  . BREAST LUMPECTOMY Right   . herniated disc cervical spine    . lower back  1992  . OOPHORECTOMY    . PARTIAL HYSTERECTOMY    . SHOULDER SURGERY Left   . SPINE SURGERY  feb 2012   c5-6 fusion, Dr Rolena Infante  . THYROID LOBECTOMY  11/11   nodule biopsy, benign     Social History:  The patient  reports that she has never smoked. She has never used  smokeless tobacco. She reports that she does not drink alcohol and does not use drugs.   Family History:  The patient's family history includes ALS in her paternal grandmother; Heart disease in her father and paternal grandmother; Hypertension in her father and sister; Lung cancer in her mother; Pneumonia in her father.    ROS:  Please see the history of present illness. All other systems are reviewed and  Negative to the above problem except as noted.    PHYSICAL EXAM: VS:  BP 102/60   Pulse 90   Ht 5\' 7"  (1.702 m)   Wt 142 lb 9.6 oz (64.7 kg)   SpO2 97%   BMI 22.33 kg/m   BP laying 112/80  P 87  Sitting   118/70   91   Standing 110/68   94   Standing 4 min  92/80  ) 94    GEN: Well nourished, well developed, in no acute distress  HEENT: normal  Neck: JVP is normal  No  carotid bruits, Cardiac: RRR; no murmurs,no LE  edema  Respiratory:  clear to auscultation bilaterally, GI: soft, nontender, nondistended, + BS  No hepatomegaly  MS: no deformity Moving all extremities   Skin: warm and dry  Tanned Neuro:  Deferred  Psych: euthymic mood, full affect   EKG:  EKG is not ordered today.    Lipid Panel    Component Value Date/Time   CHOL 169 09/21/2018 1414   CHOL 168 10/26/2017 1347   TRIG 67.0 09/21/2018 1414   HDL 56.40 09/21/2018 1414   HDL 57 10/26/2017 1347   CHOLHDL 3 09/21/2018 1414   VLDL 13.4 09/21/2018 1414   LDLCALC 99 09/21/2018 1414   LDLCALC 95 10/26/2017 1347      Wt Readings from Last 3 Encounters:  12/22/19 142 lb 9.6 oz (64.7 kg)  10/11/19 134 lb (60.8 kg)  08/08/19 134 lb (60.8 kg)      ASSESSMENT AND PLAN:  1  Autonomic dysfunction BP drops with standing  Will decrease pindolol to 2.5 bid since BP low   Keep on florinef   Hydrate (she says she is )   Exercise quads    F/U in the summer    2  Dyslipidemia  On lipitor  Check lipids  3  Parkinson's   Continue on Sinemet 4  DM   Follows with M Altheimer    Sugar low today  Stay active   Stay hydrated     Current medicines are reviewed at length with the patient today.  The patient does not have concerns regarding medicines.  Signed, Dorris Carnes, MD  12/22/2019 2:31 PM    [address], [place], [place]  [address] Phone: [phone]; Fax: [phone]

## 2019-12-22 ENCOUNTER — Encounter: Payer: Self-pay | Admitting: Internal Medicine

## 2019-12-22 ENCOUNTER — Other Ambulatory Visit: Payer: Self-pay

## 2019-12-22 ENCOUNTER — Ambulatory Visit: Payer: Medicare PPO | Admitting: Internal Medicine

## 2019-12-22 VITALS — BP 102/60 | HR 90 | Ht 67.0 in | Wt 142.6 lb

## 2019-12-22 DIAGNOSIS — G90A Postural orthostatic tachycardia syndrome (POTS): Secondary | ICD-10-CM

## 2019-12-22 DIAGNOSIS — I498 Other specified cardiac arrhythmias: Secondary | ICD-10-CM | POA: Diagnosis not present

## 2019-12-22 DIAGNOSIS — E782 Mixed hyperlipidemia: Secondary | ICD-10-CM

## 2019-12-22 DIAGNOSIS — E1065 Type 1 diabetes mellitus with hyperglycemia: Secondary | ICD-10-CM | POA: Diagnosis not present

## 2019-12-22 MED ORDER — PINDOLOL 5 MG PO TABS
2.5000 mg | ORAL_TABLET | Freq: Two times a day (BID) | ORAL | 1 refills | Status: DC
Start: 2019-12-22 — End: 2020-06-22

## 2019-12-22 NOTE — Patient Instructions (Signed)
Medication Instructions:  Your physician has recommended you make the following change in your medication:  1.) decrease pindolol to 2.5 mg twice a day (HALF TABLET TWICE DAILY)  *If you need a refill on your cardiac medications before your next appointment, please call your pharmacy*   Lab Work: Today: bmet, lipids  If you have labs (blood work) drawn today and your tests are completely normal, you will receive your results only by: [name] MyChart Message (if you have MyChart) OR . A paper copy in the mail If you have any lab test that is abnormal or we need to change your treatment, we will call you to review the results.   Testing/Procedures: none   Follow-Up: At [facility], you and your health needs are our priority.  As part of our continuing mission to provide you with exceptional heart care, we have created designated Provider Care Teams.  These Care Teams include your primary Cardiologist (physician) and Advanced Practice Providers (APPs -  Physician Assistants and Nurse Practitioners) who all work together to provide you with the care you need, when you need it.   Your next appointment:   8 month(s)  The format for your next appointment:   In Person  Provider:   You may see Dorris Carnes, MD or one of the following Advanced Practice Providers on your designated Care Team:    Richardson Dopp, PA-C  Robbie Lis, [place]    Other Instructions

## 2019-12-23 LAB — LIPID PANEL
Chol/HDL Ratio: 2.8 ratio (ref 0.0–4.4)
Cholesterol, Total: 156 mg/dL (ref 100–199)
HDL: 56 mg/dL (ref >39)
LDL Chol Calc (NIH): 81 mg/dL (ref 0–99)
Triglycerides: 105 mg/dL (ref 0–149)
VLDL Cholesterol Cal: 19 mg/dL (ref 5–40)

## 2019-12-23 LAB — BASIC METABOLIC PANEL
BUN/Creatinine Ratio: 18 (ref 12–28)
BUN: 17 mg/dL (ref 8–27)
CO2: 30 mmol/L — ABNORMAL HIGH (ref 20–29)
Calcium: 9.6 mg/dL (ref 8.7–10.3)
Chloride: 102 mmol/L (ref 96–106)
Creatinine, Ser: 0.93 mg/dL (ref 0.57–1.00)
GFR calc Af Amer: 77 mL/min/{1.73_m2} (ref >59)
GFR calc non Af Amer: 67 mL/min/{1.73_m2} (ref >59)
Glucose: 41 mg/dL — ABNORMAL LOW (ref 65–99)
Potassium: 4.5 mmol/L (ref 3.5–5.2)
Sodium: 143 mmol/L (ref 134–144)

## 2020-01-11 DIAGNOSIS — M24874 Other specific joint derangements of right foot, not elsewhere classified: Secondary | ICD-10-CM | POA: Diagnosis not present

## 2020-01-11 DIAGNOSIS — E119 Type 2 diabetes mellitus without complications: Secondary | ICD-10-CM | POA: Diagnosis not present

## 2020-01-11 DIAGNOSIS — M205X1 Other deformities of toe(s) (acquired), right foot: Secondary | ICD-10-CM | POA: Diagnosis not present

## 2020-01-11 DIAGNOSIS — M21611 Bunion of right foot: Secondary | ICD-10-CM | POA: Diagnosis not present

## 2020-01-12 DIAGNOSIS — F3189 Other bipolar disorder: Secondary | ICD-10-CM | POA: Diagnosis not present

## 2020-01-12 DIAGNOSIS — Z01818 Encounter for other preprocedural examination: Secondary | ICD-10-CM | POA: Diagnosis not present

## 2020-01-12 DIAGNOSIS — G8929 Other chronic pain: Secondary | ICD-10-CM | POA: Diagnosis not present

## 2020-01-20 DIAGNOSIS — M2021 Hallux rigidus, right foot: Secondary | ICD-10-CM | POA: Diagnosis not present

## 2020-01-20 DIAGNOSIS — M25571 Pain in right ankle and joints of right foot: Secondary | ICD-10-CM | POA: Diagnosis not present

## 2020-01-20 DIAGNOSIS — M21611 Bunion of right foot: Secondary | ICD-10-CM | POA: Diagnosis not present

## 2020-01-20 DIAGNOSIS — M24874 Other specific joint derangements of right foot, not elsewhere classified: Secondary | ICD-10-CM | POA: Diagnosis not present

## 2020-02-05 ENCOUNTER — Other Ambulatory Visit: Payer: Self-pay | Admitting: Internal Medicine

## 2020-02-06 DIAGNOSIS — B351 Tinea unguium: Secondary | ICD-10-CM | POA: Diagnosis not present

## 2020-02-08 ENCOUNTER — Other Ambulatory Visit: Payer: Self-pay | Admitting: Internal Medicine

## 2020-02-20 DIAGNOSIS — M2021 Hallux rigidus, right foot: Secondary | ICD-10-CM | POA: Diagnosis not present

## 2020-02-20 DIAGNOSIS — B351 Tinea unguium: Secondary | ICD-10-CM | POA: Diagnosis not present

## 2020-03-05 DIAGNOSIS — M2021 Hallux rigidus, right foot: Secondary | ICD-10-CM | POA: Diagnosis not present

## 2020-03-12 DIAGNOSIS — E1065 Type 1 diabetes mellitus with hyperglycemia: Secondary | ICD-10-CM | POA: Diagnosis not present

## 2020-03-19 DIAGNOSIS — M2021 Hallux rigidus, right foot: Secondary | ICD-10-CM | POA: Diagnosis not present

## 2020-03-27 DIAGNOSIS — F3189 Other bipolar disorder: Secondary | ICD-10-CM | POA: Diagnosis not present

## 2020-03-30 ENCOUNTER — Telehealth: Payer: Self-pay | Admitting: Internal Medicine

## 2020-03-30 NOTE — Telephone Encounter (Signed)
1.Medication Requested:  atorvastatin (LIPITOR) 40 MG tablet 2. Pharmacy (Name[facility]): [id number] - [place], [address] AT [facility] [address] Phone:  [phone]  Fax:  [phone]     3. On Med List: Yes   4. Last Visit with PCP: 8.31.2021 5. Next visit date with PCP: 9.6.2022   Agent: Please be advised that RX refills may take up to 3 business days. We ask that you follow-up with your pharmacy.

## 2020-04-02 ENCOUNTER — Other Ambulatory Visit: Payer: Self-pay

## 2020-04-02 MED ORDER — ATORVASTATIN CALCIUM 40 MG PO TABS: 40.0000 mg | ORAL_TABLET | Freq: Every day | ORAL | 0 refills | Status: DC

## 2020-04-02 NOTE — Telephone Encounter (Signed)
Patient notified prescribed request was refill today within the three day time frame.

## 2020-04-02 NOTE — Telephone Encounter (Signed)
Patient calling to check on status of medication, she has been out of it for 5 days.

## 2020-04-03 DIAGNOSIS — E1065 Type 1 diabetes mellitus with hyperglycemia: Secondary | ICD-10-CM | POA: Diagnosis not present

## 2020-04-03 DIAGNOSIS — E041 Nontoxic single thyroid nodule: Secondary | ICD-10-CM | POA: Diagnosis not present

## 2020-04-03 DIAGNOSIS — E782 Mixed hyperlipidemia: Secondary | ICD-10-CM | POA: Diagnosis not present

## 2020-04-05 ENCOUNTER — Other Ambulatory Visit: Payer: Self-pay | Admitting: Internal Medicine

## 2020-04-05 DIAGNOSIS — Z9641 Presence of insulin pump (external) (internal): Secondary | ICD-10-CM | POA: Diagnosis not present

## 2020-04-05 DIAGNOSIS — I498 Other specified cardiac arrhythmias: Secondary | ICD-10-CM | POA: Diagnosis not present

## 2020-04-05 DIAGNOSIS — Z978 Presence of other specified devices: Secondary | ICD-10-CM | POA: Diagnosis not present

## 2020-04-05 DIAGNOSIS — Z794 Long term (current) use of insulin: Secondary | ICD-10-CM | POA: Diagnosis not present

## 2020-04-05 DIAGNOSIS — E1043 Type 1 diabetes mellitus with diabetic autonomic (poly)neuropathy: Secondary | ICD-10-CM | POA: Diagnosis not present

## 2020-04-05 DIAGNOSIS — E1065 Type 1 diabetes mellitus with hyperglycemia: Secondary | ICD-10-CM | POA: Diagnosis not present

## 2020-04-05 DIAGNOSIS — E1042 Type 1 diabetes mellitus with diabetic polyneuropathy: Secondary | ICD-10-CM | POA: Diagnosis not present

## 2020-04-05 DIAGNOSIS — E103293 Type 1 diabetes mellitus with mild nonproliferative diabetic retinopathy without macular edema, bilateral: Secondary | ICD-10-CM | POA: Diagnosis not present

## 2020-04-05 DIAGNOSIS — E782 Mixed hyperlipidemia: Secondary | ICD-10-CM | POA: Diagnosis not present

## 2020-04-05 NOTE — Telephone Encounter (Signed)
Follow up message, Tramadol  Patient requesting refill today because he will be leaving, going out of town

## 2020-04-16 DIAGNOSIS — M2021 Hallux rigidus, right foot: Secondary | ICD-10-CM | POA: Diagnosis not present

## 2020-04-16 DIAGNOSIS — E1151 Type 2 diabetes mellitus with diabetic peripheral angiopathy without gangrene: Secondary | ICD-10-CM | POA: Diagnosis not present

## 2020-04-16 DIAGNOSIS — B351 Tinea unguium: Secondary | ICD-10-CM | POA: Diagnosis not present

## 2020-04-24 DIAGNOSIS — F3189 Other bipolar disorder: Secondary | ICD-10-CM | POA: Diagnosis not present

## 2020-05-05 ENCOUNTER — Other Ambulatory Visit: Payer: Self-pay | Admitting: Adult Health

## 2020-05-24 DIAGNOSIS — F3189 Other bipolar disorder: Secondary | ICD-10-CM | POA: Diagnosis not present

## 2020-06-13 DIAGNOSIS — M2021 Hallux rigidus, right foot: Secondary | ICD-10-CM | POA: Diagnosis not present

## 2020-06-13 DIAGNOSIS — M21611 Bunion of right foot: Secondary | ICD-10-CM | POA: Diagnosis not present

## 2020-06-13 DIAGNOSIS — M24874 Other specific joint derangements of right foot, not elsewhere classified: Secondary | ICD-10-CM | POA: Diagnosis not present

## 2020-06-13 DIAGNOSIS — M205X2 Other deformities of toe(s) (acquired), left foot: Secondary | ICD-10-CM | POA: Diagnosis not present

## 2020-06-14 DIAGNOSIS — E1065 Type 1 diabetes mellitus with hyperglycemia: Secondary | ICD-10-CM | POA: Diagnosis not present

## 2020-06-19 DIAGNOSIS — F3189 Other bipolar disorder: Secondary | ICD-10-CM | POA: Diagnosis not present

## 2020-06-22 ENCOUNTER — Other Ambulatory Visit: Payer: Self-pay | Admitting: Internal Medicine

## 2020-06-22 ENCOUNTER — Other Ambulatory Visit: Payer: Self-pay | Admitting: *Deleted

## 2020-06-22 MED ORDER — PINDOLOL 5 MG PO TABS: 2.5000 mg | ORAL_TABLET | Freq: Two times a day (BID) | ORAL | 1 refills | Status: DC

## 2020-06-22 NOTE — Telephone Encounter (Signed)
Please refill as per office routine med refill policy (all routine meds refilled for 3 mo or monthly per pt preference up to one year from last visit, then month to month grace period for 3 mo, then further med refills will have to be denied)  

## 2020-06-29 ENCOUNTER — Telehealth: Payer: Self-pay | Admitting: Internal Medicine

## 2020-06-29 MED ORDER — TRAMADOL HCL 50 MG PO TABS: ORAL_TABLET | ORAL | 2 refills | Status: DC

## 2020-06-29 NOTE — Telephone Encounter (Signed)
Patient going out of town. No Tramadol remaining  Requesting refill for traMADol (ULTRAM) 50 MG tablet Pharmacy [facility] #[id number] - [place], [address] AT [place]

## 2020-07-03 DIAGNOSIS — E782 Mixed hyperlipidemia: Secondary | ICD-10-CM | POA: Diagnosis not present

## 2020-07-03 DIAGNOSIS — E1065 Type 1 diabetes mellitus with hyperglycemia: Secondary | ICD-10-CM | POA: Diagnosis not present

## 2020-07-05 DIAGNOSIS — Z978 Presence of other specified devices: Secondary | ICD-10-CM | POA: Diagnosis not present

## 2020-07-05 DIAGNOSIS — E041 Nontoxic single thyroid nodule: Secondary | ICD-10-CM | POA: Diagnosis not present

## 2020-07-05 DIAGNOSIS — I498 Other specified cardiac arrhythmias: Secondary | ICD-10-CM | POA: Diagnosis not present

## 2020-07-05 DIAGNOSIS — E782 Mixed hyperlipidemia: Secondary | ICD-10-CM | POA: Diagnosis not present

## 2020-07-05 DIAGNOSIS — E103293 Type 1 diabetes mellitus with mild nonproliferative diabetic retinopathy without macular edema, bilateral: Secondary | ICD-10-CM | POA: Diagnosis not present

## 2020-07-05 DIAGNOSIS — Z9641 Presence of insulin pump (external) (internal): Secondary | ICD-10-CM | POA: Diagnosis not present

## 2020-07-05 DIAGNOSIS — E1043 Type 1 diabetes mellitus with diabetic autonomic (poly)neuropathy: Secondary | ICD-10-CM | POA: Diagnosis not present

## 2020-07-05 DIAGNOSIS — E1065 Type 1 diabetes mellitus with hyperglycemia: Secondary | ICD-10-CM | POA: Diagnosis not present

## 2020-07-05 DIAGNOSIS — E1042 Type 1 diabetes mellitus with diabetic polyneuropathy: Secondary | ICD-10-CM | POA: Diagnosis not present

## 2020-07-26 ENCOUNTER — Telehealth: Payer: Self-pay | Admitting: Internal Medicine

## 2020-07-26 NOTE — Telephone Encounter (Signed)
LVM for pt to rtn my call to schedule AWV with [facility]. Please schedule awv if pt calls the office.  

## 2020-08-01 DIAGNOSIS — F3189 Other bipolar disorder: Secondary | ICD-10-CM | POA: Diagnosis not present

## 2020-08-03 DIAGNOSIS — R0981 Nasal congestion: Secondary | ICD-10-CM | POA: Diagnosis not present

## 2020-08-03 DIAGNOSIS — Z20822 Contact with and (suspected) exposure to covid-19: Secondary | ICD-10-CM | POA: Diagnosis not present

## 2020-08-03 DIAGNOSIS — R059 Cough, unspecified: Secondary | ICD-10-CM | POA: Diagnosis not present

## 2020-08-08 DIAGNOSIS — J019 Acute sinusitis, unspecified: Secondary | ICD-10-CM | POA: Diagnosis not present

## 2020-08-09 ENCOUNTER — Ambulatory Visit: Payer: Medicare PPO

## 2020-08-14 ENCOUNTER — Ambulatory Visit: Payer: Medicare PPO

## 2020-08-20 ENCOUNTER — Ambulatory Visit: Payer: Medicare PPO | Admitting: Neurology

## 2020-08-20 ENCOUNTER — Encounter: Payer: Self-pay | Admitting: Neurology

## 2020-08-20 ENCOUNTER — Telehealth: Payer: Self-pay

## 2020-08-20 VITALS — BP 148/83 | HR 95 | Ht 67.0 in | Wt 164.5 lb

## 2020-08-20 DIAGNOSIS — E1042 Type 1 diabetes mellitus with diabetic polyneuropathy: Secondary | ICD-10-CM | POA: Diagnosis not present

## 2020-08-20 DIAGNOSIS — G2119 Other drug induced secondary parkinsonism: Secondary | ICD-10-CM

## 2020-08-20 DIAGNOSIS — I951 Orthostatic hypotension: Secondary | ICD-10-CM

## 2020-08-20 DIAGNOSIS — F3131 Bipolar disorder, current episode depressed, mild: Secondary | ICD-10-CM | POA: Diagnosis not present

## 2020-08-20 MED ORDER — CARBIDOPA-LEVODOPA ER 50-200 MG PO TBCR: 1.0000 | EXTENDED_RELEASE_TABLET | Freq: Two times a day (BID) | ORAL | 3 refills | Status: DC

## 2020-08-20 NOTE — Patient Instructions (Signed)
Secondary parkinsonism.    Carbidopa; Levodopa tablets What is this medication? CARBIDOPA;LEVODOPA (kar bi DOE pa; lee voe DOE pa) is used to treat thesymptoms of Parkinson's disease. This medicine may be used for other purposes; ask your health care provider orpharmacist if you have questions. COMMON BRAND NAME(S): Atamet, Dhivy, SINEMET What should I tell my care team before I take this medication? They need to know if you have any of these conditions: depression or other mental illness diabetes glaucoma heart disease, including history of a heart attack history of irregular heartbeat kidney disease liver disease lung or breathing disease, like asthma narcolepsy sleep apnea stomach or intestine problems an unusual or allergic reaction to levodopa, carbidopa, other medicines, foods, dyes, or preservatives pregnant or trying to get pregnant breast-feeding How should I use this medication? Take this medicine by mouth with a glass of water. Follow the directions on the prescription label. Take your doses at regular intervals. Do not take your medicine more often than directed. Do not stop taking except on the advice ofyour doctor or health care professional. Talk to your pediatrician regarding the use of this medicine in children.Special care may be needed. Overdosage: If you think you have taken too much of this medicine contact apoison control center or emergency room at once. NOTE: This medicine is only for you. Do not share this medicine with others. What if I miss a dose? If you miss a dose, take it as soon as you can. If it is almost time for yournext dose, take only that dose. Do not take double or extra doses. What may interact with this medication? Do not take this medicine with any of the following medications: MAOIs like Marplan, Nardil, and Parnate reserpine tetrabenazine This medicine may also interact with the following  medications: alcohol droperidol entacapone iron supplements or multivitamins with iron isoniazid, INH linezolid medicines for depression, anxiety, or psychotic disturbances medicines for high blood pressure medicines for sleep metoclopramide papaverine procarbazine tedizolid rasagiline selegiline tolcapone This list may not describe all possible interactions. Give your health care provider a list of all the medicines, herbs, non-prescription drugs, or dietary supplements you use. Also tell them if you smoke, drink alcohol, or use illegaldrugs. Some items may interact with your medicine. What should I watch for while using this medication? Visit your health care professional for regular checks on your progress. Tell your health care professional if your symptoms do not start to get better or if they get worse. Do not stop taking except on your health care professional's advice. You may develop a severe reaction. Your health care professional willtell you how much medicine to take. You may experience a wearing of effect prior to the time for your next dose of this medicine. You may also experience an on-off effect where the medicine apparently stops working for anything from a minute to several hours, then suddenly starts working again. Tell your doctor or health care professional ifany of these symptoms happen to you. Your dose may need to be changed. A high protein diet can slow or prevent absorption of this medicine. Avoid high protein foods near the time of taking this medicine to help to prevent these problems. Take this medicine at least 30 minutes before eating or one hour after meals. You may want to eat higher protein foods later in the day or in small amounts. Discuss your diet with your doctor or health care professionalor nutritionist. You may get drowsy or dizzy. Do not drive, use machinery,  or do anything that needs mental alertness until you know how this drug affects you. Do not  stand or sit up quickly, especially if you are an older patient. This reduces the risk of dizzy or fainting spells. Alcohol may interfere with the effect of thismedicine. Avoid alcoholic drinks. When taking this medicine, you may fall asleep without notice. You may be doing activities like driving a car, talking, or eating. You may not feel drowsy before it happens. Contact your health care provider right away if this happensto you. There have been reports of increased sexual urges or other strong urges such as gambling while taking this medicine. If you experience any of these while taking this medicine, you should report this to your health care provider assoon as possible. If you are diabetic, this medicine may interfere with the accuracy of some tests for sugar or ketones in the urine (does not interfere with blood tests). Check with your doctor or health care professional before changing the dose ofyour diabetic medicine. This medicine may discolor the urine or sweat, making it look darker or red in color. This is of no cause for concern. However, this may stain clothing orfabrics. This medicine may cause a decrease in vitamin B6. You should make sure that you get enough vitamin B6 while you are taking this medicine. Discuss the foods youeat and the vitamins you take with your health care professional. What side effects may I notice from receiving this medication? Side effects that you should report to your doctor or health care professionalas soon as possible: allergic reactions like skin rash, itching or hives, swelling of the face, lips, or tongue changes in emotions or moods falling asleep during normal activities like driving fast, irregular heartbeat feeling faint or lightheaded, falls fever hallucinations new or increased gambling urges, sexual urges, uncontrolled spending, binge or compulsive eating, or other urges stomach pain trouble passing urine or change in the amount of  urine uncontrollable movements of the arms, face, head, mouth, neck, or upper body Side effects that usually do not require medical attention (report to yourdoctor or health care professional if they continue or are bothersome): dizziness headache loss of appetite nausea trouble sleeping This list may not describe all possible side effects. Call your doctor for medical advice about side effects. You may report side effects to FDA at1-800-FDA-1088. Where should I keep my medication? Keep out of the reach of children. Store at room temperature between 15 and 30 degrees C (59 and 86 degrees F).Protect from light. Throw away any unused medicine after the expiration date. NOTE: This sheet is a summary. It may not cover all possible information. If you have questions about this medicine, talk to your doctor, pharmacist, orhealth care provider.  2022 Elsevier/Gold Standard (2018-10-04 19:49:59)

## 2020-08-20 NOTE — Progress Notes (Signed)
PATIENT: Lynn Barnes DOB: [birth year]  REASON FOR VISIT: follow up HISTORY FROM: patient   Chief Complaint  Patient presents with   Follow-up    Rm 11, alone. Here today for parkinsonism f/u, hand and arm tremors have worsen over the last few months. Reports no falls.      HISTORY OF PRESENT ILLNESS:    08/20/20 Rv with [name], MD, routine Rv.  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  Over the years tremors have increased and at times she had problems with orthostatic hypotension.  She does have a resting trauma which affects both upper extremities, she has noticed it with action as well as at rest.  Even that a DaTscan in November 2019 did not confirm Parkinson pathology I have wanted her to see Dr. Angelina Ok For a differentiation between parkinsonism which could be drug-induced and a secondary disorder of true Parkinson disease which can sometimes still occur.  I also think that a deep brain stimulator sometimes treats our essential tremor patients better than high-dose medication.   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      No further falls, no panic attacks, anxiety reportedly well controlled.   DM type 1 childhood onset- with a dysautonomia and neuropathy. POTS followed by Dorris Carnes, MD Tremors have responded very well to Sinemet( start in 2017)  and after she took a "break' from carbidopa levodopa she noted how severe the tremor was when untreated.  12-2017 DAT scan  negative for primary parkinson's disease.   60 y.o. 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 [facility]), Bipolar disorder ([place]) (02/15/2011), Cervical spondylosis without myelopathy, Depression, Depression, Diabetes mellitus, Dysautonomia ([place]) (10/24/2011), Dyskinesia, Dyslipidemia, History of nuclear stress test, Hyperlipidemia, IBS (irritable bowel syndrome) (02/15/2011), Insomnia (02/21/2011), Multinodular thyroid, Neuropathy, Orthostatic hypotension, Orthostatic hypotension, Parkinsonism ([place]), Parkinsonism ([place]), POTS (postural orthostatic tachycardia syndrome), Radiculopathy, Resting tremor, Rotator cuff syndrome of left shoulder, S/P partial hysterectomy, Spondylosis without myelopathy, Subacute dyskinesia due to drugs(333.85), Tachycardia, Therapy, Thyroid nodule, and Tremor.   2-20202 Debbora Presto, NP  Lynn Barnes is a 60 y.o. 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  She was started on Sinemet about 2 years ago.  Sinemet helps but she still have some trouble walking and a tremor. She feels tremor is worse with activity like writing or drinking out of a cup.  She is able to perform all ADLs.  She does feel that she still has some trouble walking.  She has not had any falls.  She does not use any assistive devices.  DaTscan in November did not reveal any primary Parkinson's disease.  She is seen every three months by her psychiatrist, Dr Gus Height.  She continues Latuda, Risperdal, Valium and Ambien prescribed.  She reports that she continues to struggle with some anxiety but feels that she is stable overall.  She is seeing Dorris Carnes for POTS and reports  that symptoms are controlled. She is taking Pindolol 5mg  BID.    HISTORY: (copied from Dr Kegan Mckeithan's note on 01/05/2018) RV 01-05-2018, follow up after 6 month visit with NP, last seen May 2019 by Ward Givens,  NP.  Patient repots she is doing poorly, her legs have become very weak, shuffling gait and the feeling of " heavy -limbs".  " I can't walk more than 100 m without having to break". No SOB.  She feels her balance and fine motor skills are affected by tremor. "Penmanship has declined. Can't apply make up".  No falls over 6 month, but several near misses.      HISTORY OF PRESENT ILLNESS:   I have the pleasure of meeting today again with Lynn Barnes, meanwhile 60 year old patient, I had placed on carbidopa levodopa after she presented in February with mouth, jaw and tongue trembling and tremors.  She feels that this has been beneficial .  She is calmer without any obvious tremor today she still has vocal cord Trembling.  She also reports a greater fluidity of movement.   She had no further POTs related problems, I had always suspected that her dysautonomia was related to her juvenile diabetes long-standing insulin dependence.   However, I would not expect dysautonomia to get better on Sinemet. Shy- Draeger ?      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 She returns today for follow-up. She was started on Sinemet at the last visit. She reports this has been beneficial. She states that her tremor has improved. Reports that her gait and balance have remained the same. Denies any falls. She does report decreased appetite and has lost weight since her last visit in February. She does report some trouble falling asleep. She reports that her tremor is primarily in the jaw and tongue. Denies any new neurological symptoms. He returns today for an evaluation.   HISTORY 04/02/16 Dr. Brett Fairy: "there are times when my mouth, jaw and tongue trembling". Reglan induced? Lynn Barnes has not used Reglan in the past., Her depression however has been treated with Geodon and Risperdal. Neuroleptic induced? [name]  She has been evaluated for Parkinsonism  secondary induced due to medication here before, and one of her concerns is memory loss, the feeling of not tracking thought process all the way through.  I evaluated her memory last 8 year ago in [place] 2017 when she scored 26 out of 30 points on a [place] cognitive assessment. Today she scored 25 out of 30 and I will print those results for comparison below.  I do not think that Lynn Barnes has early onset dementia but maybe a cognitive disorder. She should take prenatal vitamins and I will try Artane for tremor.   REVIEW OF SYSTEMS: Out of a complete 14 system review of symptoms, the patient complains only of the following symptoms, heat heat intolerance, tremor, passing out, agitation, depression, nervousness/anxiousness and all other reviewed systems are negative.  ALLERGIES: Allergies  Allergen Reactions   Artane [Trihexyphenidyl]     Made tremors worse.    Klonopin [Clonazepam] Other (See Comments)    Severe weakness   Nsaids Other (See Comments)    Irritates stomach   Sulfonamide Derivatives Rash    HOME MEDICATIONS: Outpatient Medications Prior to Visit  Medication Sig Dispense Refill   atorvastatin (LIPITOR) 40 MG tablet TAKE 1 TABLET(40 MG) BY MOUTH DAILY 90 tablet 0   busPIRone (  BUSPAR) 10 MG tablet Take 10 mg by mouth 2 (two) times daily.     carbidopa-levodopa (SINEMET IR) 25-100 MG tablet TAKE 1 TABLET BY MOUTH THREE TIMES DAILY BEFORE MEALS 270 tablet 1   fludrocortisone (FLORINEF) 0.1 MG tablet TAKE 1/2 TABLET BY MOUTH TWICE DAILY. PLEASE KEEP UPCOMING APPT IN NOVEMBER WITH*DR. ROSS BEFORE ANYMORE REFILLS. THANK YOU 90 tablet 3   Glucagon 3 MG/DOSE POWD Place into the nose.     glucose blood test strip 1 each by Other route. 7 times per day   - as instructed     HUMALOG KWIKPEN 100 UNIT/ML KwikPen INJECT BEFORE EACH MEAL WITH 1 UNIT PER 10 GM CARBS PLUS CORRECTIN OF 1 UNIT PER 50 MG     insulin degludec (TRESIBA) 100 UNIT/ML FlexTouch Pen Inject 27 Units into the skin  daily.      LATUDA 120 MG TABS Take 1 tablet by mouth at bedtime.     pindolol (VISKEN) 5 MG tablet Take 0.5 tablets (2.5 mg total) by mouth 2 (two) times daily. 90 tablet 1   risperidone (RISPERDAL) 4 MG tablet Take 8 mg by mouth at bedtime.  0   traMADol (ULTRAM) 50 MG tablet TAKE 1 TABLET(50 MG) BY MOUTH EVERY 6 HOURS AS NEEDED FOR MODERATE PAIN 120 tablet 2   zolpidem (AMBIEN) 10 MG tablet Take 10 mg by mouth at bedtime.  1   No facility-administered medications prior to visit.    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 ([place])    Bipolar disorder ([place]) 02/15/2011   Cervical spondylosis without myelopathy    Depression    Depression    Diabetes mellitus    sees Dr Altheimer/endo   Dysautonomia ([place]) 10/24/2011   Dyskinesia     subacute,due to drugs   Dyslipidemia    History of nuclear stress test    Myoview 11/16: EF 81%, no ischemia; Low Risk   Hyperlipidemia    IBS (irritable bowel syndrome) 02/15/2011   Insomnia 02/21/2011   Multinodular thyroid    with large dominant solid nodule with calcifications in the lower left pole benign by FNA in 1/12 and stable by repeat ulltrasound in 4/13   Neuropathy    with paresthesias in feet   Orthostatic hypotension    Orthostatic hypotension    Parkinsonism ([facility])    due to prolonged prozac/risperdl for yrs/Dr Dohmeir   Parkinsonism ([facility])    POTS (postural orthostatic tachycardia syndrome)    Radiculopathy    cervical   Resting tremor    Rotator cuff syndrome of left shoulder    S/P partial hysterectomy    Spondylosis without myelopathy    Subacute dyskinesia due to drugs(333.85)    dr Loranzo Desha/neuro   Tachycardia    syndrome   Therapy    on therapy by psychiatrist using sebutex    Thyroid nodule    s/p biopsy - benign approx jan 2012   Tremor      PAST SURGICAL HISTORY: Past Surgical History:  Procedure Laterality Date   ACROMIOPLASTY Left 2002   Dr Gladstone Lighter   BREAST EXCISIONAL BIOPSY Right    benign   BREAST LUMPECTOMY Right    herniated disc cervical spine     lower back  1992  OOPHORECTOMY     PARTIAL HYSTERECTOMY     SHOULDER SURGERY Left    SPINE SURGERY  feb 2012   c5-6 fusion, Dr Rolena Infante   THYROID LOBECTOMY  11/11   nodule biopsy, benign    FAMILY HISTORY: Family History  Problem Relation Age of Onset   Lung cancer Mother    Heart disease Father    Hypertension Father    Pneumonia Father    Heart disease Paternal Grandmother    ALS Paternal Grandmother    Hypertension Sister     SOCIAL HISTORY: Social History   Socioeconomic History   Marital status: Married    Spouse name: Not on file   Number of children: 0   Years of education: BA   Highest education level: Not on file  Occupational History   Occupation: TEACHER    Employer: [employer]  Tobacco Use   Smoking status: Never   Smokeless tobacco: Never  Vaping Use   Vaping Use: Never used  Substance and Sexual Activity   Alcohol use: No    Alcohol/week: 0.0 standard drinks   Drug use: No   Sexual activity: Never  Other Topics Concern   Not on file  Social History Narrative   Patient is single and lives with my partner Anne Ng [place]).   Patient is disabled.   Patient is right-handed.   Patient has a Haematologist.   Patient drinks one soda every other day.   Social Determinants of Health   Financial Resource Strain: Not on file  Food Insecurity: Not on file  Transportation Needs: Not on file  Physical Activity: Not on file  Stress: Not on file  Social Connections: Not on file  Intimate Partner Violence: Not on file      PHYSICAL EXAM  Vitals:   08/20/20 1310  BP: (!) 148/83  Pulse: 95  Weight: 164 lb 8 oz (74.6 kg)  Height: 5\' 7"  (1.702 m)   Body mass index is 25.76 kg/m.  Generalized: Well  developed, in no acute distress  Cardiology: normal rate and rhythm, no murmur noted Neurological examination  Mentation: Alert oriented to time, place, history taking. Follows all commands, and her  speech and language are fluent Cranial nerve : MASKED FACE: Pupils were equal in size and shape and reactive to light. Extraocular movements were full, visual field were full on confrontational test.  Facial sensation and strength were normal. Uvula tongue midline. Head turning and shoulder shrug  were normal and symmetric. Motor: Full strength and bulk with ROM of bilateral upper extremities.  4/5 of bilateral lower extremities.   No bradykinesia noted today.  She does have tremor of her jaw that is worse with action. No cogwheeling.  Sensory: deferred.  Coordination: Cerebellar testing reveals good finger-nose-finger bilaterally.  There is tremor at rest more than at action. Strong grip.  Gait and station: Gait is stable.  The patient reports that she is sometimes furniture surfing or looking for wall to hold onto and that her turns are fragmented.  Her highest risk of falling is with turning bending forwards or getting something from a higher shelf, stretching upwards.  Reflexes: Deep tendon reflexes are decreased but symmetric bilaterally in upper and lower extremities,.   DIAGNOSTIC DATA (LABS, IMAGING, TESTING) - I reviewed patient records, labs, notes, testing and imaging myself where available.  MMSE - Mini Mental State Exam 08/21/2014  Orientation to time 5  Orientation to Place 5  Registration 3  Attention/ Calculation 5  Recall 3  Language- name 2 objects 2  Language- repeat 1  Language- follow 3 step command 2  Language- read & follow direction 1  Write a sentence 1  Copy design 1  Total score 29     Lab Results  Component Value Date   WBC 20.9 (H) 06/10/2019   HGB 13.3 06/10/2019   HCT 39.9 06/10/2019   MCV 95.0 06/10/2019   PLT 317 06/10/2019      Component Value  Date/Time   NA 143 12/22/2019 1506   K 4.5 12/22/2019 1506   CL 102 12/22/2019 1506   CO2 30 (H) 12/22/2019 1506   GLUCOSE 41 (L) 12/22/2019 1506   GLUCOSE 203 (H) 06/10/2019 1216   BUN 17 12/22/2019 1506   CREATININE 0.93 12/22/2019 1506   CREATININE 0.87 07/06/2015 1523   CALCIUM 9.6 12/22/2019 1506   PROT 6.3 09/21/2018 1414   PROT 6.0 01/05/2018 1125   ALBUMIN 3.9 09/21/2018 1414   ALBUMIN 3.9 01/05/2018 1125   AST 13 09/21/2018 1414   ALT 11 09/21/2018 1414   ALKPHOS 63 09/21/2018 1414   BILITOT 0.5 09/21/2018 1414   BILITOT 0.4 01/05/2018 1125   GFRNONAA 67 12/22/2019 1506   GFRAA 77 12/22/2019 1506   Lab Results  Component Value Date   CHOL 156 12/22/2019   HDL 56 12/22/2019   LDLCALC 81 12/22/2019   TRIG 105 12/22/2019   CHOLHDL 2.8 12/22/2019   Lab Results  Component Value Date   HGBA1C 8.3 (H) 09/21/2018   Lab Results  Component Value Date   JKDTOIZT24 580 09/21/2018   Lab Results  Component Value Date   TSH 0.561 06/10/2019       ASSESSMENT AND PLAN  Patient is long established here with Secondary parkinsonism, tremor dominant, responding to Dopamine therapy.   Overall Lynn Barnes is doing well on Sinemet.  She has noted about a 50% improvement in her symptoms since increasing dose to 3 times a day about a year ago.  She now takes sinemet tid 2 tab each time.   She does continue to have concerns with her gait and tremor.  Tremor does seem to be worse with activity.  DaTscan November 2019 had not revealed a primary Parkinson's disease.   Symptoms most likely related to medications. She also clearly has resting tremor now.   She continues close follow-up with her psychiatrist and feels stable from mental health standpoint.  She is also working closely with cardiology for POTS. Feels that part is not bothering her, no near fainting.  We will continue her current therapy.   Patient has labs with Dr. Elyse Hsu.  A new appointment request will be issued  for Dr Deboraha Sprang.     No blood work needed.  I spent 25  minutes with the patient. About  50% of this time was spent counseling and educating patient on plan of care and medications.    [name] , MD  08/20/2020, 2:00 PM [facility] [address] [place], [place] [address] [phone]

## 2020-08-20 NOTE — Telephone Encounter (Signed)
Referral for Neurology sent to [facility]. P: [id number].

## 2020-08-21 ENCOUNTER — Other Ambulatory Visit: Payer: Self-pay

## 2020-08-21 ENCOUNTER — Ambulatory Visit (INDEPENDENT_AMBULATORY_CARE_PROVIDER_SITE_OTHER): Payer: Medicare PPO

## 2020-08-21 VITALS — BP 120/70 | HR 93 | Temp 98.3°F | Ht 67.0 in | Wt 163.2 lb

## 2020-08-21 DIAGNOSIS — Z1211 Encounter for screening for malignant neoplasm of colon: Secondary | ICD-10-CM

## 2020-08-21 DIAGNOSIS — Z0000 Encounter for general adult medical examination without abnormal findings: Secondary | ICD-10-CM | POA: Diagnosis not present

## 2020-08-21 NOTE — Patient Instructions (Signed)
[name] , Thank you for taking time to come for your Medicare Wellness Visit. I appreciate your ongoing commitment to your health goals. Please review the following plan we discussed and let me know if I can assist you in the future.   Screening recommendations/referrals: Colonoscopy: 05/15/2011; due every 5 years; Cologuard referral Mammogram: 11/12/2019; due every 1-2 years Bone Density: never done Recommended yearly ophthalmology/optometry visit for glaucoma screening and checkup Recommended yearly dental visit for hygiene and checkup  Vaccinations: Influenza vaccine: 10/11/2019 Pneumococcal vaccine: 08/21/2016, 08/24/2017 Tdap vaccine: 12/12/2010; due every 10 years Shingles vaccine: never done; can check with insurance for out of pocket expense or your local pharmacy.  Covid-19: 05/09/2019, 06/11/2019, 11/11/2019  Advanced directives: Please bring a copy of your health care power of attorney and living will to the office at your convenience.  Conditions/risks identified: Client understands the importance of follow-up with providers by attending scheduled visits and discussed goals to eat healthier, increase physical activity, exercise the brain, socialize more,  get enough rest and make time for laughter.  Next appointment: Please schedule your next Medicare Wellness Visit with your Nurse Health Advisor in 1 year by calling [phone].  Preventive Care 40-64 Years, Female Preventive care refers to lifestyle choices and visits with your health care provider that can promote health and wellness. What does preventive care include? A yearly physical exam. This is also called an annual well check. Dental exams once or twice a year. Routine eye exams. Ask your health care provider how often you should have your eyes checked. Personal lifestyle choices, including: Daily care of your teeth and gums. Regular physical activity. Eating a healthy diet. Avoiding tobacco and drug use. Limiting  alcohol use. Practicing safe sex. Taking low-dose aspirin daily starting at age 71. Taking vitamin and mineral supplements as recommended by your health care provider. What happens during an annual well check? The services and screenings done by your health care provider during your annual well check will depend on your age, overall health, lifestyle risk factors, and family history of disease. Counseling  Your health care provider may ask you questions about your: Alcohol use. Tobacco use. Drug use. Emotional well-being. Home and relationship well-being. Sexual activity. Eating habits. Work and work Statistician. Method of birth control. Menstrual cycle. Pregnancy history. Screening  You may have the following tests or measurements: Height, weight, and BMI. Blood pressure. Lipid and cholesterol levels. These may be checked every 5 years, or more frequently if you are over 43 years old. Skin check. Lung cancer screening. You may have this screening every year starting at age 54 if you have a 30-pack-year history of smoking and currently smoke or have quit within the past 15 years. Fecal occult blood test (FOBT) of the stool. You may have this test every year starting at age 42. Flexible sigmoidoscopy or colonoscopy. You may have a sigmoidoscopy every 5 years or a colonoscopy every 10 years starting at age 47. Hepatitis C blood test. Hepatitis B blood test. Sexually transmitted disease (STD) testing. Diabetes screening. This is done by checking your blood sugar (glucose) after you have not eaten for a while (fasting). You may have this done every 1-3 years. Mammogram. This may be done every 1-2 years. Talk to your health care provider about when you should start having regular mammograms. This may depend on whether you have a family history of breast cancer. BRCA-related cancer screening. This may be done if you have a family history of breast, ovarian, tubal,  or peritoneal  cancers. Pelvic exam and Pap test. This may be done every 3 years starting at age 63. Starting at age 52, this may be done every 5 years if you have a Pap test in combination with an HPV test. Bone density scan. This is done to screen for osteoporosis. You may have this scan if you are at high risk for osteoporosis. Discuss your test results, treatment options, and if necessary, the need for more tests with your health care provider. Vaccines  Your health care provider may recommend certain vaccines, such as: Influenza vaccine. This is recommended every year. Tetanus, diphtheria, and acellular pertussis (Tdap, Td) vaccine. You may need a Td booster every 10 years. Zoster vaccine. You may need this after age 77. Pneumococcal 13-valent conjugate (PCV13) vaccine. You may need this if you have certain conditions and were not previously vaccinated. Pneumococcal polysaccharide (PPSV23) vaccine. You may need one or two doses if you smoke cigarettes or if you have certain conditions. Talk to your health care provider about which screenings and vaccines you need and how often you need them. This information is not intended to replace advice given to you by your health care provider. Make sure you discuss any questions you have with your health care provider. Document Released: 02/23/2015 Document Revised: 10/17/2015 Document Reviewed: 11/28/2014 Elsevier Interactive Patient Education  2017 [employer] Prevention in the Home Falls can cause injuries. They can happen to people of all ages. There are many things you can do to make your home safe and to help prevent falls. What can I do on the outside of my home? Regularly fix the edges of walkways and driveways and fix any cracks. Remove anything that might make you trip as you walk through a door, such as a raised step or threshold. Trim any bushes or trees on the path to your home. Use bright outdoor lighting. Clear any walking paths of  anything that might make someone trip, such as rocks or tools. Regularly check to see if handrails are loose or broken. Make sure that both sides of any steps have handrails. Any raised decks and porches should have guardrails on the edges. Have any leaves, snow, or ice cleared regularly. Use sand or salt on walking paths during winter. Clean up any spills in your garage right away. This includes oil or grease spills. What can I do in the bathroom? Use night lights. Install grab bars by the toilet and in the tub and shower. Do not use towel bars as grab bars. Use non-skid mats or decals in the tub or shower. If you need to sit down in the shower, use a plastic, non-slip stool. Keep the floor dry. Clean up any water that spills on the floor as soon as it happens. Remove soap buildup in the tub or shower regularly. Attach bath mats securely with double-sided non-slip rug tape. Do not have throw rugs and other things on the floor that can make you trip. What can I do in the bedroom? Use night lights. Make sure that you have a light by your bed that is easy to reach. Do not use any sheets or blankets that are too big for your bed. They should not hang down onto the floor. Have a firm chair that has side arms. You can use this for support while you get dressed. Do not have throw rugs and other things on the floor that can make you trip. What can I do  in the kitchen? Clean up any spills right away. Avoid walking on wet floors. Keep items that you use a lot in easy-to-reach places. If you need to reach something above you, use a strong step stool that has a grab bar. Keep electrical cords out of the way. Do not use floor polish or wax that makes floors slippery. If you must use wax, use non-skid floor wax. Do not have throw rugs and other things on the floor that can make you trip. What can I do with my stairs? Do not leave any items on the stairs. Make sure that there are handrails on both  sides of the stairs and use them. Fix handrails that are broken or loose. Make sure that handrails are as long as the stairways. Check any carpeting to make sure that it is firmly attached to the stairs. Fix any carpet that is loose or worn. Avoid having throw rugs at the top or bottom of the stairs. If you do have throw rugs, attach them to the floor with carpet tape. Make sure that you have a light switch at the top of the stairs and the bottom of the stairs. If you do not have them, ask someone to add them for you. What else can I do to help prevent falls? Wear shoes that: Do not have high heels. Have rubber bottoms. Are comfortable and fit you well. Are closed at the toe. Do not wear sandals. If you use a stepladder: Make sure that it is fully opened. Do not climb a closed stepladder. Make sure that both sides of the stepladder are locked into place. Ask someone to hold it for you, if possible. Clearly mark and make sure that you can see: Any grab bars or handrails. First and last steps. Where the edge of each step is. Use tools that help you move around (mobility aids) if they are needed. These include: Canes. Walkers. Scooters. Crutches. Turn on the lights when you go into a dark area. Replace any light bulbs as soon as they burn out. Set up your furniture so you have a clear path. Avoid moving your furniture around. If any of your floors are uneven, fix them. If there are any pets around you, be aware of where they are. Review your medicines with your doctor. Some medicines can make you feel dizzy. This can increase your chance of falling. Ask your doctor what other things that you can do to help prevent falls. This information is not intended to replace advice given to you by your health care provider. Make sure you discuss any questions you have with your health care provider. Document Released: 11/23/2008 Document Revised: 07/05/2015 Document Reviewed: 03/03/2014 Elsevier  Interactive Patient Education  2017 [employer].

## 2020-08-21 NOTE — Progress Notes (Signed)
Subjective:   Lynn Barnes is a 60 y.o. female who presents for Medicare Annual (Subsequent) preventive examination.  Review of Systems     Cardiac Risk Factors include: advanced age (>11men, >34 women);diabetes mellitus;dyslipidemia;family history of premature cardiovascular disease     Objective:    Today's Vitals   08/21/20 1407  BP: 120/70  Pulse: 93  Temp: 98.3 F (36.8 C)  SpO2: 98%  Weight: 163 lb 3.2 oz (74 kg)  Height: 5\' 7"  (1.702 m)  PainSc: 0-No pain   Body mass index is 25.56 kg/m.  Advanced Directives 08/21/2020 06/10/2019 05/06/2019 03/23/2018 08/21/2014  Does Patient Have a Medical Advance Directive? Yes No No Yes Yes  Type of Advance Directive Living will;Healthcare Power of [place];Living will [place];Living will  Does patient want to make changes to medical advance directive? No - Patient declined - - - -  Copy of [place] in Chart? No - copy requested - - No - copy requested No - copy requested  Would patient like information on creating a medical advance directive? - - No - Patient declined - -    Current Medications (verified) Outpatient Encounter Medications as of 08/21/2020  Medication Sig   atorvastatin (LIPITOR) 40 MG tablet TAKE 1 TABLET(40 MG) BY MOUTH DAILY   busPIRone (BUSPAR) 10 MG tablet Take 10 mg by mouth 3 (three) times daily.   carbidopa-levodopa (SINEMET CR) 50-200 MG tablet Take 1 tablet by mouth 2 (two) times daily.   fludrocortisone (FLORINEF) 0.1 MG tablet TAKE 1/2 TABLET BY MOUTH TWICE DAILY. PLEASE KEEP UPCOMING APPT IN NOVEMBER WITH*DR. ROSS BEFORE ANYMORE REFILLS. THANK YOU   Glucagon 3 MG/DOSE POWD Place into the nose.   glucose blood test strip 1 each by Other route. 7 times per day   - as instructed   HUMALOG KWIKPEN 100 UNIT/ML KwikPen INJECT BEFORE EACH MEAL WITH 1 UNIT PER 10 GM CARBS PLUS CORRECTIN OF 1 UNIT PER 50 MG   insulin degludec (TRESIBA)  100 UNIT/ML FlexTouch Pen Inject 27 Units into the skin daily.    LATUDA 120 MG TABS Take 1 tablet by mouth at bedtime.   pindolol (VISKEN) 5 MG tablet Take 0.5 tablets (2.5 mg total) by mouth 2 (two) times daily.   risperidone (RISPERDAL) 4 MG tablet Take 8 mg by mouth at bedtime.   traMADol (ULTRAM) 50 MG tablet TAKE 1 TABLET(50 MG) BY MOUTH EVERY 6 HOURS AS NEEDED FOR MODERATE PAIN   zolpidem (AMBIEN) 10 MG tablet Take 10 mg by mouth at bedtime.   No facility-administered encounter medications on file as of 08/21/2020.    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[place])    Bipolar disorder ([place]) 02/15/2011   Cervical spondylosis without myelopathy    Depression    Depression    Diabetes mellitus    sees Dr Altheimer/endo   Dysautonomia ([place]) 10/24/2011   Dyskinesia     subacute,due to drugs   Dyslipidemia    History of nuclear stress test    Myoview 11/16: EF 81%, no ischemia; Low Risk   Hyperlipidemia    IBS (irritable bowel syndrome) 02/15/2011  Insomnia 02/21/2011   Multinodular thyroid    with large dominant solid nodule with calcifications in the lower left pole benign by FNA in 1/12 and stable by repeat ulltrasound in 4/13   Neuropathy    with paresthesias in feet   Orthostatic hypotension    Orthostatic hypotension    Parkinsonism (HCC)    due to prolonged prozac/risperdl for yrs/Dr Dohmeir   Parkinsonism (HCC)    POTS (postural orthostatic tachycardia syndrome)    Radiculopathy    cervical   Resting tremor    Rotator cuff syndrome of left shoulder    S/P partial hysterectomy    Spondylosis without myelopathy    Subacute dyskinesia due to drugs(333.85)    dr dohmeier/neuro   Tachycardia     syndrome   Therapy    on therapy by psychiatrist using sebutex    Thyroid nodule    s/p biopsy - benign approx jan 2012   Tremor    Past Surgical History:  Procedure Laterality Date   ACROMIOPLASTY Left 2002   Dr Gladstone Lighter   BREAST EXCISIONAL BIOPSY Right    benign   BREAST LUMPECTOMY Right    herniated disc cervical spine     lower back  1992   OOPHORECTOMY     PARTIAL HYSTERECTOMY     SHOULDER SURGERY Left    SPINE SURGERY  feb 2012   c5-6 fusion, Dr Rolena Infante   THYROID LOBECTOMY  11/11   nodule biopsy, benign   Family History  Problem Relation Age of Onset   Lung cancer Mother    Heart disease Father    Hypertension Father    Pneumonia Father    Heart disease Paternal Grandmother    ALS Paternal Grandmother    Hypertension Sister    Social History   Socioeconomic History   Marital status: Married    Spouse name: Not on file   Number of children: 0   Years of education: BA   Highest education level: Not on file  Occupational History   Occupation: TEACHER    Employer: [employer]  Tobacco Use   Smoking status: Never   Smokeless tobacco: Never  Vaping Use   Vaping Use: Never used  Substance and Sexual Activity   Alcohol use: No    Alcohol/week: 0.0 standard drinks   Drug use: No   Sexual activity: Never  Other Topics Concern   Not on file  Social History Narrative   Patient is single and lives with my partner Anne Ng [place]).   Patient is disabled.   Patient is right-handed.   Patient has a Haematologist.   Patient drinks one soda every other day.   Social Determinants of Health   Financial Resource Strain: Low Risk    Difficulty of Paying Living Expenses: Not hard at all  Food Insecurity: No Food Insecurity   Worried About Charity fundraiser in the Last Year: Never true   [place] in the Last Year: Never true  Transportation Needs: No Transportation Needs   Lack of Transportation (Medical): No   Lack of Transportation  (Non-Medical): No  Physical Activity: Sufficiently Active   Days of Exercise per Week: 3 days   Minutes of Exercise per Session: 60 min  Stress: No Stress Concern Present   Feeling of Stress : Not at all  Social Connections: Moderately Isolated   Frequency of Communication with Friends and Family: More than three times a week   Frequency of Social Gatherings  with Friends and Family: More than three times a week   Attends Religious Services: Never   Marine scientist or Organizations: No   Attends Music therapist: Never   Marital Status: Married    Tobacco Counseling Counseling given: Not Answered   Clinical Intake:  Pre-visit preparation completed: Yes  Pain : No/denies pain Pain Score: 0-No pain     BMI - recorded: 25.56 Nutritional Status: BMI 25 -29 Overweight Nutritional Risks: None Diabetes: Yes CBG done?: No Did pt. bring in CBG monitor from home?: No  How often do you need to have someone help you when you read instructions, pamphlets, or other written materials from your doctor or pharmacy?: 1 - Never What is the last grade level you completed in school?: Master's degree  Diabetic? yes  Interpreter Needed?: No  Information entered by :: Lisette Abu, LPN   Activities of Daily Living In your present state of health, do you have any difficulty performing the following activities: 08/21/2020  Hearing? N  Vision? N  Difficulty concentrating or making decisions? N  Walking or climbing stairs? N  Dressing or bathing? N  Doing errands, shopping? N  Preparing Food and eating ? N  Using the Toilet? N  In the past six months, have you accidently leaked urine? N  Do you have problems with loss of bowel control? N  Managing your Medications? N  Managing your Finances? N  Housekeeping or managing your Housekeeping? N  Some recent data might be hidden    Patient Care Team: Biagio Borg, MD as PCP - General (Internal Medicine) Fay Records, MD as PCP - Cardiology (Cardiology) Dohmeier, Asencion Partridge, MD as Consulting Physician (Neurology) Altheimer, Legrand Como, MD as Referring Physician (Endocrinology) Dan Humphreys (Nurse Practitioner) Monna Fam, MD as Consulting Physician (Ophthalmology)  Indicate any recent Medical Services you may have received from other than [facility] providers in the past year (date may be approximate).     Assessment:   This is a routine wellness examination for Circle.  Hearing/Vision screen Hearing Screening - Comments:: Patient declined any hearing difficulty. Vision Screening - Comments:: Patient does not wear glasses/contacts.  Eye exam done once a year by [facility].  Dietary issues and exercise activities discussed: Current Exercise Habits: Home exercise routine, Type of exercise: walking, Time (Minutes): 60, Frequency (Times/Week): 3, Weekly Exercise (Minutes/Week): 180, Intensity: Moderate, Exercise limited by: neurologic condition(s)   Goals Addressed   None   Depression Screen PHQ 2/9 Scores 08/21/2020 05/06/2019 09/21/2018 03/23/2018 08/24/2017 08/21/2016 08/21/2015  PHQ - 2 Score 0 0 0 2 2 0 1  PHQ- 9 Score - - - 7 - - -    Fall Risk Fall Risk  08/21/2020 05/06/2019 09/21/2018 03/23/2018 01/05/2018  Falls in the past year? 0 1 0 1 0  Number falls in past yr: 0 1 - 0 -  Comment - - - - -  Injury with Fall? 0 0 - 0 -  Risk Factor Category  - - - - -  Risk for fall due to : No Fall Risks Medication side effect;Impaired balance/gait;Impaired mobility - Impaired mobility;Impaired balance/gait -  Follow up Falls evaluation completed Falls evaluation completed;Falls prevention discussed - Falls prevention discussed -    FALL RISK PREVENTION PERTAINING TO THE HOME:  Any stairs in or around the home? No  If so, are there any without handrails? No  Home free of loose throw rugs in walkways, pet beds, electrical cords,  etc? Yes  Adequate lighting in your home to reduce risk  of falls? Yes   ASSISTIVE DEVICES UTILIZED TO PREVENT FALLS:  Life alert? No  Use of a cane, walker or w/c? No  Grab bars in the bathroom? Yes  Shower chair or bench in shower? Yes  Elevated toilet seat or a handicapped toilet? Yes   TIMED UP AND GO:  Was the test performed? Yes .  Length of time to ambulate 10 feet: 7 sec.   Gait steady and fast without use of assistive device  Cognitive Function: Normal cognitive status assessed by direct observation by this Nurse Health Advisor. No abnormalities found.   MMSE - Mini Mental State Exam 08/21/2014  Orientation to time 5  Orientation to Place 5  Registration 3  Attention/ Calculation 5  Recall 3  Language- name 2 objects 2  Language- repeat 1  Language- follow 3 step command 2  Language- read & follow direction 1  Write a sentence 1  Copy design 1  Total score 29   Montreal Cognitive Assessment  04/02/2016 02/21/2015  Visuospatial/ Executive (0/5) 5 5  Naming (0/3) 3 3  Attention: Read list of digits (0/2) 2 2  Attention: Read list of letters (0/1) 1 1  Attention: Serial 7 subtraction starting at 100 (0/3) 3 3  Language: Repeat phrase (0/2) 2 2  Language : Fluency (0/1) 0 0  Abstraction (0/2) 2 2  Delayed Recall (0/5) 2 3  Orientation (0/6) 5 5  Total 25 26  Adjusted Score (based on education) 25 26   6CIT Screen 05/06/2019  What Year? 0 points  What month? 0 points  What time? 0 points  Count back from 20 0 points  Months in reverse 0 points  Repeat phrase 0 points  Total Score 0    Immunizations Immunization History  Administered Date(s) Administered   Influenza Inj Mdck Quad Pf 10/29/2018, 10/29/2018   Influenza Whole 11/11/2007, 04/24/2008   Influenza, Seasonal, Injecte, Preservative Fre 11/10/2012   Influenza,inj,Quad PF,6+ Mos 09/27/2013, 10/11/2019   Influenza-Unspecified 11/13/2017, 10/29/2018   Moderna Sars-Covid-2 Vaccination 05/09/2019, 06/11/2019   Pneumococcal Conjugate-13 08/21/2016    Pneumococcal Polysaccharide-23 08/24/2017    TDAP status: Up to date  Flu Vaccine status: Up to date  Pneumococcal vaccine status: Up to date  Covid-19 vaccine status: Completed vaccines  Qualifies for Shingles Vaccine? Yes   Zostavax completed No   Shingrix Completed?: No.    Education has been provided regarding the importance of this vaccine. Patient has been advised to call insurance company to determine out of pocket expense if they have not yet received this vaccine. Advised may also receive vaccine at local pharmacy or Health Dept. Verbalized acceptance and understanding.  Screening Tests Health Maintenance  Topic Date Due   PAP SMEAR-Modifier  09/05/2008   Zoster Vaccines- Shingrix (1 of 2) Never done   HEMOGLOBIN A1C  03/24/2019   MAMMOGRAM  09/16/2019   URINE MICROALBUMIN  09/21/2019   COVID-19 Vaccine (4 - Booster for Moderna series) 03/13/2020   COLONOSCOPY (Pts 45-6yrs Insurance coverage will need to be confirmed)  10/10/2020 (Originally 05/14/2016)   INFLUENZA VACCINE  09/10/2020   FOOT EXAM  10/10/2020   OPHTHALMOLOGY EXAM  11/23/2020   TETANUS/TDAP  12/11/2020   PNEUMOCOCCAL POLYSACCHARIDE VACCINE AGE 84-64 HIGH RISK  Completed   Hepatitis C Screening  Completed   HIV Screening  Completed   Pneumococcal Vaccine 40-100 Years old  Aged Out   HPV VACCINES  Aged Out  Health Maintenance  Health Maintenance Due  Topic Date Due   PAP SMEAR-Modifier  09/05/2008   Zoster Vaccines- Shingrix (1 of 2) Never done   HEMOGLOBIN A1C  03/24/2019   MAMMOGRAM  09/16/2019   URINE MICROALBUMIN  09/21/2019   COVID-19 Vaccine (4 - Booster for Moderna series) 03/13/2020    Colorectal cancer screening: Type of screening: Colonoscopy. Completed 05/15/2011. Repeat every 5 years (Patient requesting referral for Cologuard).  Mammogram status: Completed 11/12/2019. Repeat every year  Bone Density status: never done  Lung Cancer Screening: (Low Dose CT Chest recommended if Age 67-80  years, 30 pack-year currently smoking OR have quit w/in 15years.) does not qualify.   Lung Cancer Screening Referral: no  Additional Screening:  Hepatitis C Screening: does qualify; Completed yes  Vision Screening: Recommended annual ophthalmology exams for early detection of glaucoma and other disorders of the eye. Is the patient up to date with their annual eye exam?  Yes  Who is the provider or what is the name of the office in which the patient attends annual eye exams? [facility] If pt is not established with a provider, would they like to be referred to a provider to establish care? No .   Dental Screening: Recommended annual dental exams for proper oral hygiene  Community Resource Referral / Chronic Care Management: CRR required this visit?  No   CCM required this visit?  No      Plan:     I have personally reviewed and noted the following in the patient's chart:   Medical and social history Use of alcohol, tobacco or illicit drugs  Current medications and supplements including opioid prescriptions.  Functional ability and status Nutritional status Physical activity Advanced directives List of other physicians Hospitalizations, surgeries, and ER visits in previous 12 months Vitals Screenings to include cognitive, depression, and falls Referrals and appointments  In addition, I have reviewed and discussed with patient certain preventive protocols, quality metrics, and best practice recommendations. A written personalized care plan for preventive services as well as general preventive health recommendations were provided to patient.     Sheral Flow, LPN   0/11/9321   Nurse Notes:  Referral for Cologuard placed 08/21/2020.

## 2020-09-03 DIAGNOSIS — E1065 Type 1 diabetes mellitus with hyperglycemia: Secondary | ICD-10-CM | POA: Diagnosis not present

## 2020-09-07 ENCOUNTER — Other Ambulatory Visit: Payer: Self-pay

## 2020-09-07 ENCOUNTER — Encounter: Payer: Self-pay | Admitting: Internal Medicine

## 2020-09-07 ENCOUNTER — Ambulatory Visit: Payer: Medicare PPO | Admitting: Internal Medicine

## 2020-09-07 VITALS — BP 110/70 | HR 91 | Temp 98.3°F | Ht 67.0 in | Wt 163.0 lb

## 2020-09-07 DIAGNOSIS — S90821A Blister (nonthermal), right foot, initial encounter: Secondary | ICD-10-CM | POA: Diagnosis not present

## 2020-09-07 DIAGNOSIS — E1042 Type 1 diabetes mellitus with diabetic polyneuropathy: Secondary | ICD-10-CM

## 2020-09-07 DIAGNOSIS — F411 Generalized anxiety disorder: Secondary | ICD-10-CM

## 2020-09-07 DIAGNOSIS — G629 Polyneuropathy, unspecified: Secondary | ICD-10-CM | POA: Diagnosis not present

## 2020-09-07 MED ORDER — AMOXICILLIN-POT CLAVULANATE 875-125 MG PO TABS: 1.0000 | ORAL_TABLET | Freq: Two times a day (BID) | ORAL | 0 refills | Status: DC

## 2020-09-07 MED ORDER — TRAMADOL HCL 50 MG PO TABS: ORAL_TABLET | ORAL | 2 refills | Status: DC

## 2020-09-07 NOTE — Progress Notes (Signed)
Chief Complaint: follow up right heel blister pain, neuoropathy, hld, dm       HPI:  Lynn Barnes is a 60 y.o. female here with 2 days onset blister with swelling and pain after wearing certain shoes with walking, has some numbness so not aware until this AM, but has not unroofed, and no fever, chills, red streaks, drainage.  Has podiatry appt next wk but wanted to have checked before that.  Pt denies chest pain, increased sob or doe, wheezing, orthopnea, PND, increased LE swelling, palpitations, dizziness or syncope.   Pt denies polydipsia, polyuria, or new focal neuro s/s.  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[place])    Bipolar disorder ([place]) 02/15/2011   Cervical spondylosis without myelopathy    Depression    Depression    Diabetes mellitus    sees Dr Altheimer/endo   Dysautonomia ([place]) 10/24/2011   Dyskinesia     subacute,due to drugs   Dyslipidemia    History of nuclear stress test    Myoview 11/16: EF 81%, no ischemia; Low Risk   Hyperlipidemia    IBS (irritable bowel syndrome) 02/15/2011   Insomnia 02/21/2011   Multinodular thyroid    with large dominant solid nodule with calcifications in the lower left pole benign by FNA in 1/12 and stable by repeat ulltrasound in 4/13   Neuropathy    with paresthesias in feet   Orthostatic hypotension    Orthostatic hypotension    Parkinsonism (HCC)    due to prolonged prozac/risperdl for yrs/Dr Dohmeir   Parkinsonism (HCC)    POTS (postural  orthostatic tachycardia syndrome)    Radiculopathy    cervical   Resting tremor    Rotator cuff syndrome of left shoulder    S/P partial hysterectomy    Spondylosis without myelopathy    Subacute dyskinesia due to drugs(333.85)    dr dohmeier/neuro   Tachycardia    syndrome   Therapy    on therapy by psychiatrist using sebutex    Thyroid nodule    s/p biopsy - benign approx jan 2012   Tremor    Past Surgical History:  Procedure Laterality Date   ACROMIOPLASTY Left 2002   Dr Gladstone Lighter   BREAST EXCISIONAL BIOPSY Right    benign   BREAST LUMPECTOMY Right    herniated disc cervical spine     lower back  1992   OOPHORECTOMY     PARTIAL HYSTERECTOMY     SHOULDER SURGERY Left    SPINE SURGERY  feb 2012   c5-6 fusion, Dr Rolena Infante   THYROID LOBECTOMY  11/11   nodule biopsy, benign    reports that she has never smoked. She has never used smokeless tobacco. She reports that she does not drink alcohol and does not use drugs. family history includes ALS in her paternal grandmother;  Heart disease in her father and paternal grandmother; Hypertension in her father and sister; Lung cancer in her mother; Pneumonia in her father. Allergies  Allergen Reactions   Artane [Trihexyphenidyl]     Made tremors worse.    Klonopin [Clonazepam] Other (See Comments)    Severe weakness   Nsaids Other (See Comments)    Irritates stomach   Sulfonamide Derivatives Rash   Current Outpatient Medications on File Prior to Visit  Medication Sig Dispense Refill   atorvastatin (LIPITOR) 40 MG tablet TAKE 1 TABLET(40 MG) BY MOUTH DAILY 90 tablet 0   busPIRone (BUSPAR) 10 MG tablet Take 10 mg by mouth 3 (three) times daily.     carbidopa-levodopa (SINEMET CR) 50-200 MG tablet Take 1 tablet by mouth 2 (two) times daily. 270 tablet 3   fludrocortisone (FLORINEF) 0.1 MG tablet TAKE 1/2 TABLET BY MOUTH TWICE DAILY. PLEASE KEEP UPCOMING APPT IN NOVEMBER WITH*DR. ROSS BEFORE ANYMORE REFILLS. THANK YOU 90 tablet 3    Glucagon 3 MG/DOSE POWD Place into the nose.     glucose blood test strip 1 each by Other route. 7 times per day   - as instructed     HUMALOG KWIKPEN 100 UNIT/ML KwikPen INJECT BEFORE EACH MEAL WITH 1 UNIT PER 10 GM CARBS PLUS CORRECTIN OF 1 UNIT PER 50 MG     insulin degludec (TRESIBA) 100 UNIT/ML FlexTouch Pen Inject 27 Units into the skin daily.      LATUDA 120 MG TABS Take 1 tablet by mouth at bedtime.     pindolol (VISKEN) 5 MG tablet Take 0.5 tablets (2.5 mg total) by mouth 2 (two) times daily. 90 tablet 1   risperidone (RISPERDAL) 4 MG tablet Take 8 mg by mouth at bedtime.  0   zolpidem (AMBIEN) 10 MG tablet Take 10 mg by mouth at bedtime.  1   No current facility-administered medications on file prior to visit.        ROS:  All others reviewed and negative.  Objective        PE:  BP 110/70   Pulse 91   Temp 98.3 F (36.8 C) (Oral)   Ht '5\' 7"'$  (1.702 m)   Wt 163 lb (73.9 kg)   SpO2 94%   BMI 25.53 kg/m                 Constitutional: Pt appears in NAD               HENT: Head: NCAT.                Right Ear: External ear normal.                 Left Ear: External ear normal.                Eyes: . Pupils are equal, round, and reactive to light. Conjunctivae and EOM are normal               Nose: without d/c or deformity               Neck: Neck supple. Gross normal ROM               Cardiovascular: Normal rate and regular rhythm.                 Pulmonary/Chest: Effort normal and breath sounds without rales or wheezing.  Abd:  Soft, NT, ND, + BS, no organomegaly               Neurological: Pt is alert. At baseline orientation, motor grossly intact               Skin, LE edema - none, but right heel has raised mild tender intact blister with fluid approx 1.5 cm, no surrounding erythema, swelling or red streaks or drainage               Psychiatric: Pt behavior is normal without agitation   Micro: none  Cardiac tracings I have personally interpreted today:   none  Pertinent Radiological findings (summarize): none   Lab Results  Component Value Date   WBC 20.9 (H) 06/10/2019   HGB 13.3 06/10/2019   HCT 39.9 06/10/2019   PLT 317 06/10/2019   GLUCOSE 41 (L) 12/22/2019   CHOL 156 12/22/2019   TRIG 105 12/22/2019   HDL 56 12/22/2019   LDLCALC 81 12/22/2019   ALT 11 09/21/2018   AST 13 09/21/2018   NA 143 12/22/2019   K 4.5 12/22/2019   CL 102 12/22/2019   CREATININE 0.93 12/22/2019   BUN 17 12/22/2019   CO2 30 (H) 12/22/2019   TSH 0.561 06/10/2019   HGBA1C 8.3 (H) 09/21/2018   MICROALBUR 2.5 (H) 09/21/2018   Assessment/Plan:  TORRY DONA is a 60 y.o. 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 [facility]), Bipolar disorder ([place]) (02/15/2011), Cervical spondylosis without myelopathy, Depression, Depression, Diabetes mellitus, Dysautonomia ([place]) (10/24/2011), Dyskinesia, Dyslipidemia, History of nuclear stress test, Hyperlipidemia, IBS (irritable bowel syndrome) (02/15/2011), Insomnia (02/21/2011), Multinodular thyroid, Neuropathy, Orthostatic hypotension, Orthostatic hypotension, Parkinsonism ([place]), Parkinsonism ([place]), POTS (postural orthostatic tachycardia syndrome), Radiculopathy, Resting tremor, Rotator cuff syndrome of left shoulder, S/P partial hysterectomy, Spondylosis without myelopathy, Subacute dyskinesia due to drugs(333.85), Tachycardia, Therapy, Thyroid nodule, and Tremor.  Blister of right heel No s/s of infection but possible so for antibx prn infection, but o/w to avoid any further pressure to the heel, such as heel cushions at night; for f/u podiatry next wed as planned  Anxiety state Stable, cont currrent med tx - buspar  Neuropathy Chronic persistent, no other ulcer, cont to work on DM control to avoid worsening  Type 1 diabetes mellitus ([place]) Lab Results  Component Value Date   HGBA1C 8.3 (H)  09/21/2018   Uncontrolled chronically,, pt to continue current medical treatment insulin, and f/u endo as planned  Followup: No follow-ups on file.  Cathlean Cower, MD 09/07/2020 10:10 PM [facility]

## 2020-09-07 NOTE — Assessment & Plan Note (Signed)
Chronic persistent, no other ulcer, cont to work on DM control to avoid worsening

## 2020-09-07 NOTE — Assessment & Plan Note (Signed)
Stable, cont currrent med tx - buspar

## 2020-09-07 NOTE — Assessment & Plan Note (Signed)
No s/s of infection but possible so for antibx prn infection, but o/w to avoid any further pressure to the heel, such as heel cushions at night; for f/u podiatry next wed as planned

## 2020-09-07 NOTE — Patient Instructions (Addendum)
Please take all new medication as prescribed - the antibiotic only if the blister bursts and it seems to get red, swelling and infected   You are given the prescription for the heel cushions to take to the [facility] on [address]  Please use loose covering to help protect until you see podiatry  Please continue all other medications as before, and refills have been done if requested - the tramadol  Please have the pharmacy call with any other refills you may need.  Please keep your appointments with your specialists as you may have planned  podiatry next WED

## 2020-09-07 NOTE — Assessment & Plan Note (Signed)
Lab Results  Component Value Date   HGBA1C 8.3 (H) 09/21/2018   Uncontrolled chronically,, pt to continue current medical treatment insulin, and f/u endo as planned

## 2020-09-12 DIAGNOSIS — B351 Tinea unguium: Secondary | ICD-10-CM | POA: Diagnosis not present

## 2020-09-12 DIAGNOSIS — E119 Type 2 diabetes mellitus without complications: Secondary | ICD-10-CM | POA: Diagnosis not present

## 2020-09-12 DIAGNOSIS — R238 Other skin changes: Secondary | ICD-10-CM | POA: Diagnosis not present

## 2020-09-12 DIAGNOSIS — M205X1 Other deformities of toe(s) (acquired), right foot: Secondary | ICD-10-CM | POA: Diagnosis not present

## 2020-09-13 DIAGNOSIS — F3189 Other bipolar disorder: Secondary | ICD-10-CM | POA: Diagnosis not present

## 2020-09-17 ENCOUNTER — Other Ambulatory Visit: Payer: Self-pay | Admitting: Internal Medicine

## 2020-09-17 NOTE — Telephone Encounter (Signed)
Please refill as per office routine med refill policy (all routine meds refilled for 3 mo or monthly per pt preference up to one year from last visit, then month to month grace period for 3 mo, then further med refills will have to be denied)  

## 2020-09-19 DIAGNOSIS — E119 Type 2 diabetes mellitus without complications: Secondary | ICD-10-CM | POA: Diagnosis not present

## 2020-09-19 DIAGNOSIS — M205X1 Other deformities of toe(s) (acquired), right foot: Secondary | ICD-10-CM | POA: Diagnosis not present

## 2020-09-19 DIAGNOSIS — M205X2 Other deformities of toe(s) (acquired), left foot: Secondary | ICD-10-CM | POA: Diagnosis not present

## 2020-09-19 DIAGNOSIS — M2021 Hallux rigidus, right foot: Secondary | ICD-10-CM | POA: Diagnosis not present

## 2020-10-11 ENCOUNTER — Encounter: Payer: Medicare PPO | Admitting: Internal Medicine

## 2020-10-16 ENCOUNTER — Other Ambulatory Visit: Payer: Self-pay

## 2020-10-16 ENCOUNTER — Encounter: Payer: Self-pay | Admitting: Internal Medicine

## 2020-10-16 ENCOUNTER — Ambulatory Visit (INDEPENDENT_AMBULATORY_CARE_PROVIDER_SITE_OTHER): Payer: Medicare PPO | Admitting: Internal Medicine

## 2020-10-16 VITALS — BP 132/68 | HR 83 | Temp 98.3°F | Ht 67.0 in | Wt 164.0 lb

## 2020-10-16 DIAGNOSIS — Z23 Encounter for immunization: Secondary | ICD-10-CM | POA: Diagnosis not present

## 2020-10-16 DIAGNOSIS — Z0000 Encounter for general adult medical examination without abnormal findings: Secondary | ICD-10-CM | POA: Diagnosis not present

## 2020-10-16 DIAGNOSIS — E7800 Pure hypercholesterolemia, unspecified: Secondary | ICD-10-CM

## 2020-10-16 DIAGNOSIS — E1042 Type 1 diabetes mellitus with diabetic polyneuropathy: Secondary | ICD-10-CM

## 2020-10-16 DIAGNOSIS — E559 Vitamin D deficiency, unspecified: Secondary | ICD-10-CM | POA: Diagnosis not present

## 2020-10-16 DIAGNOSIS — B029 Zoster without complications: Secondary | ICD-10-CM | POA: Diagnosis not present

## 2020-10-16 DIAGNOSIS — E538 Deficiency of other specified B group vitamins: Secondary | ICD-10-CM

## 2020-10-16 LAB — URINALYSIS, ROUTINE W REFLEX MICROSCOPIC
Bilirubin Urine: NEGATIVE
Hgb urine dipstick: NEGATIVE
Ketones, ur: NEGATIVE
Leukocytes,Ua: NEGATIVE
Nitrite: NEGATIVE
RBC / HPF: NONE SEEN (ref 0–?)
Specific Gravity, Urine: 1.025 (ref 1.000–1.030)
Total Protein, Urine: NEGATIVE
Urine Glucose: >=1000 — AB
Urobilinogen, UA: 0.2 (ref 0.0–1.0)
pH: 5.5 (ref 5.0–8.0)

## 2020-10-16 LAB — CBC WITH DIFFERENTIAL/PLATELET
Basophils Absolute: 0 10*3/uL (ref 0.0–0.1)
Basophils Relative: 0.5 % (ref 0.0–3.0)
Eosinophils Absolute: 0 10*3/uL (ref 0.0–0.7)
Eosinophils Relative: 0 % (ref 0.0–5.0)
HCT: 41.8 % (ref 36.0–46.0)
Hemoglobin: 13.9 g/dL (ref 12.0–15.0)
Lymphocytes Relative: 14.1 % (ref 12.0–46.0)
Lymphs Abs: 0.9 10*3/uL (ref 0.7–4.0)
MCHC: 33.3 g/dL (ref 30.0–36.0)
MCV: 95.8 fl (ref 78.0–100.0)
Monocytes Absolute: 0.4 10*3/uL (ref 0.1–1.0)
Monocytes Relative: 5.7 % (ref 3.0–12.0)
Neutro Abs: 5 10*3/uL (ref 1.4–7.7)
Neutrophils Relative %: 79.7 % — ABNORMAL HIGH (ref 43.0–77.0)
Platelets: 298 10*3/uL (ref 150.0–400.0)
RBC: 4.36 Mil/uL (ref 3.87–5.11)
RDW: 12.6 % (ref 11.5–15.5)
WBC: 6.2 10*3/uL (ref 4.0–10.5)

## 2020-10-16 LAB — BASIC METABOLIC PANEL
BUN: 23 mg/dL (ref 6–23)
CO2: 30 mEq/L (ref 19–32)
Calcium: 9.4 mg/dL (ref 8.4–10.5)
Chloride: 98 mEq/L (ref 96–112)
Creatinine, Ser: 1.07 mg/dL (ref 0.40–1.20)
GFR: 56.26 mL/min — ABNORMAL LOW (ref >60.00)
Glucose, Bld: 366 mg/dL — ABNORMAL HIGH (ref 70–99)
Potassium: 3.9 mEq/L (ref 3.5–5.1)
Sodium: 137 mEq/L (ref 135–145)

## 2020-10-16 LAB — VITAMIN D 25 HYDROXY (VIT D DEFICIENCY, FRACTURES): VITD: 56.35 ng/mL (ref 30.00–100.00)

## 2020-10-16 LAB — HEPATIC FUNCTION PANEL
ALT: 8 U/L (ref 0–35)
AST: 18 U/L (ref 0–37)
Albumin: 3.9 g/dL (ref 3.5–5.2)
Alkaline Phosphatase: 70 U/L (ref 39–117)
Bilirubin, Direct: 0.1 mg/dL (ref 0.0–0.3)
Total Bilirubin: 0.7 mg/dL (ref 0.2–1.2)
Total Protein: 6.8 g/dL (ref 6.0–8.3)

## 2020-10-16 LAB — LIPID PANEL
Cholesterol: 162 mg/dL (ref 0–200)
HDL: 63.8 mg/dL (ref >39.00)
LDL Cholesterol: 84 mg/dL (ref 0–99)
NonHDL: 98.64
Total CHOL/HDL Ratio: 3
Triglycerides: 71 mg/dL (ref 0.0–149.0)
VLDL: 14.2 mg/dL (ref 0.0–40.0)

## 2020-10-16 LAB — HEMOGLOBIN A1C: Hgb A1c MFr Bld: 8 % — ABNORMAL HIGH (ref 4.6–6.5)

## 2020-10-16 LAB — MICROALBUMIN / CREATININE URINE RATIO
Creatinine,U: 107.8 mg/dL
Microalb Creat Ratio: 0.6 mg/g (ref 0.0–30.0)
Microalb, Ur: <0.7 mg/dL (ref 0.0–1.9)

## 2020-10-16 LAB — TSH: TSH: 0.81 u[IU]/mL (ref 0.35–5.50)

## 2020-10-16 LAB — VITAMIN B12: Vitamin B-12: 789 pg/mL (ref 211–911)

## 2020-10-16 NOTE — Progress Notes (Signed)
Patient ID: Lynn Barnes, female   DOB: [birth year], 60 y.o.   MRN: [id number]         Chief Complaint:: wellness exam       HPI:  Lynn Barnes is a 60 y.o. female here for wellness exam; has cologuard kit at home to do soon; plans to call GYN for pap soon, declines shingrix and colonoscopy, o/w up to date with preventive referrals vs immunizations.  Pt denies chest pain, increased sob or doe, wheezing, orthopnea, PND, increased LE swelling, palpitations, dizziness or syncope.   Pt denies polydipsia, polyuria, or new focal neuro s/s   Pt denies fever, wt loss, night sweats, loss of appetite, or other constitutional symptoms    Wt Readings from Last 3 Encounters:  10/16/20 164 lb (74.4 kg)  09/07/20 163 lb (73.9 kg)  08/21/20 163 lb 3.2 oz (74 kg)   BP Readings from Last 3 Encounters:  10/16/20 132/68  09/07/20 110/70  08/21/20 120/70   Immunization History  Administered Date(s) Administered   Influenza Inj Mdck Quad Pf 10/29/2018, 10/29/2018   Influenza Whole 11/11/2007, 04/24/2008   Influenza, Seasonal, Injecte, Preservative Fre 11/10/2012   Influenza,inj,Quad PF,6+ Mos 09/27/2013, 10/11/2019, 10/16/2020   Influenza-Unspecified 11/13/2017, 10/29/2018   Moderna Sars-Covid-2 Vaccination 05/09/2019, 06/11/2019, 11/11/2019, 08/31/2020   Pneumococcal Conjugate-13 08/21/2016   Pneumococcal Polysaccharide-23 08/24/2017   Health Maintenance Due  Topic Date Due   PAP SMEAR-Modifier  09/05/2008      Past Medical History:  Diagnosis Date   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   Addiction, 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[place])    Allergy    spring   Anxiety    ADHD CHRONIC ANXIETY   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 ([place])    Bipolar disorder ([place]) 02/15/2011   Cervical spondylosis without myelopathy    Depression    Depression    Diabetes mellitus    sees Dr Altheimer/endo   Dysautonomia ([place]) 10/24/2011   Dyskinesia     subacute,due to drugs   Dyslipidemia    History of nuclear stress test     Myoview 11/16: EF 81%, no ischemia; Low Risk   Hyperlipidemia    IBS (irritable bowel syndrome) 02/15/2011   Insomnia 02/21/2011   Multinodular thyroid    with large dominant solid nodule with calcifications in the lower left pole benign by FNA in 1/12 and stable by repeat ulltrasound in 4/13   Neuropathy    with paresthesias in feet   Orthostatic hypotension    Orthostatic hypotension    Parkinsonism (HCC)    due to prolonged prozac/risperdl for yrs/Dr Dohmeir   Parkinsonism (HCC)    POTS (postural orthostatic tachycardia syndrome)    Radiculopathy    cervical   Resting tremor    Rotator cuff syndrome of left shoulder    S/P partial hysterectomy    Spondylosis without myelopathy    Subacute dyskinesia due to drugs(333.85)    dr dohmeier/neuro   Tachycardia    syndrome   Therapy    on therapy by psychiatrist using sebutex    Thyroid nodule    s/p biopsy - benign approx jan 2012   Tremor    Past Surgical History:  Procedure Laterality Date   ACROMIOPLASTY Left 2002   Dr Gladstone Lighter   BREAST EXCISIONAL BIOPSY Right    benign   BREAST LUMPECTOMY Right    herniated disc cervical spine     lower back  1992   OOPHORECTOMY     PARTIAL HYSTERECTOMY  SHOULDER SURGERY Left    SPINE SURGERY  feb 2012   c5-6 fusion, Dr Rolena Infante   THYROID LOBECTOMY  11/11   nodule biopsy, benign    reports that she has never smoked. She has never used smokeless tobacco. She reports that she does not drink alcohol and does not use drugs. family history includes ALS in her paternal grandmother; Heart disease in her father and paternal grandmother; Hypertension in her father and sister; Lung cancer in her mother; Pneumonia in her father. Allergies  Allergen Reactions   Artane [Trihexyphenidyl]     Made tremors worse.    Klonopin [Clonazepam] Other (See Comments)    Severe weakness   Nsaids Other (See Comments)    Irritates stomach   Sulfonamide Derivatives Rash   Current Outpatient Medications on  File Prior to Visit  Medication Sig Dispense Refill   amoxicillin-clavulanate (AUGMENTIN) 875-125 MG tablet Take 1 tablet by mouth 2 (two) times daily. 20 tablet 0   atorvastatin (LIPITOR) 40 MG tablet TAKE 1 TABLET(40 MG) BY MOUTH DAILY 90 tablet 0   busPIRone (BUSPAR) 10 MG tablet Take 10 mg by mouth 3 (three) times daily.     carbidopa-levodopa (SINEMET CR) 50-200 MG tablet Take 1 tablet by mouth 2 (two) times daily. 270 tablet 3   fludrocortisone (FLORINEF) 0.1 MG tablet TAKE 1/2 TABLET BY MOUTH TWICE DAILY. PLEASE KEEP UPCOMING APPT IN NOVEMBER WITH*DR. ROSS BEFORE ANYMORE REFILLS. THANK YOU 90 tablet 3   Glucagon 3 MG/DOSE POWD Place into the nose.     glucose blood test strip 1 each by Other route. 7 times per day   - as instructed     HUMALOG KWIKPEN 100 UNIT/ML KwikPen INJECT BEFORE EACH MEAL WITH 1 UNIT PER 10 GM CARBS PLUS CORRECTIN OF 1 UNIT PER 50 MG     insulin degludec (TRESIBA) 100 UNIT/ML FlexTouch Pen Inject 27 Units into the skin daily.      LATUDA 120 MG TABS Take 1 tablet by mouth at bedtime.     pindolol (VISKEN) 5 MG tablet Take 0.5 tablets (2.5 mg total) by mouth 2 (two) times daily. 90 tablet 1   risperidone (RISPERDAL) 4 MG tablet Take 8 mg by mouth at bedtime.  0   traMADol (ULTRAM) 50 MG tablet TAKE 1 TABLET(50 MG) BY MOUTH EVERY 6 HOURS AS NEEDED FOR MODERATE PAIN 120 tablet 2   zolpidem (AMBIEN) 10 MG tablet Take 10 mg by mouth at bedtime.  1   No current facility-administered medications on file prior to visit.        ROS:  All others reviewed and negative.  Objective        PE:  BP 132/68 (BP Location: Left Arm, Patient Position: Sitting, Cuff Size: Normal)   Pulse 83   Temp 98.3 F (36.8 C) (Oral)   Ht 5' 7"  (1.702 m)   Wt 164 lb (74.4 kg)   SpO2 98%   BMI 25.69 kg/m                 Constitutional: Pt appears in NAD               HENT: Head: NCAT.                Right Ear: External ear normal.                 Left Ear: External ear normal.  Eyes: . Pupils are equal, round, and reactive to light. Conjunctivae and EOM are normal               Nose: without d/c or deformity               Neck: Neck supple. Gross normal ROM               Cardiovascular: Normal rate and regular rhythm.                 Pulmonary/Chest: Effort normal and breath sounds without rales or wheezing.                Abd:  Soft, NT, ND, + BS, no organomegaly               Neurological: Pt is alert. At baseline orientation, motor grossly intact               Skin: Skin is warm. No rashes, no other new lesions, LE edema - none               Psychiatric: Pt behavior is normal without agitation   Micro: none  Cardiac tracings I have personally interpreted today:  none  Pertinent Radiological findings (summarize): none   Lab Results  Component Value Date   WBC 6.2 10/16/2020   HGB 13.9 10/16/2020   HCT 41.8 10/16/2020   PLT 298.0 10/16/2020   GLUCOSE 366 (H) 10/16/2020   CHOL 162 10/16/2020   TRIG 71.0 10/16/2020   HDL 63.80 10/16/2020   LDLCALC 84 10/16/2020   ALT 8 10/16/2020   AST 18 10/16/2020   NA 137 10/16/2020   K 3.9 10/16/2020   CL 98 10/16/2020   CREATININE 1.07 10/16/2020   BUN 23 10/16/2020   CO2 30 10/16/2020   TSH 0.81 10/16/2020   HGBA1C 8.0 (H) 10/16/2020   MICROALBUR <0.7 10/16/2020   Assessment/Plan:  Lynn Barnes is a 60 y.o. 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 [facility]), Bipolar disorder ([place]) (02/15/2011), Cervical spondylosis without myelopathy, Depression, Depression, Diabetes mellitus, Dysautonomia ([place]) (10/24/2011), Dyskinesia, Dyslipidemia, History of nuclear stress test, Hyperlipidemia, IBS (irritable bowel syndrome) (02/15/2011), Insomnia (02/21/2011), Multinodular thyroid, Neuropathy, Orthostatic hypotension, Orthostatic hypotension, Parkinsonism ([place]), Parkinsonism ([place]), POTS (postural  orthostatic tachycardia syndrome), Radiculopathy, Resting tremor, Rotator cuff syndrome of left shoulder, S/P partial hysterectomy, Spondylosis without myelopathy, Subacute dyskinesia due to drugs(333.85), Tachycardia, Therapy, Thyroid nodule, and Tremor.  Preventative health care Age and sex appropriate education and counseling updated with regular exercise and diet Referrals for preventative services - for cologuard, pt to call for GYN appt soon Immunizations addressed - declines shignrix and colonoscopy Smoking counseling  - none needed Evidence for depression or other mood disorder - none significant Most recent labs reviewed. I have personally reviewed and have noted: 1) the patient's medical and social history 2) The patient's current medications and supplements 3) The patient's height, weight, and BMI have been recorded in the chart   Type 1 diabetes mellitus ([place]) Lab Results  Component Value Date   HGBA1C 8.0 (H) 10/16/2020   Stable, pt to continue current medical treatment insulin - to f/u endo as planned   Hyperlipidemia Lab Results  Component Value Date   LDLCALC 84 10/16/2020   Uncontrolled, goal ldl < 70, pt to continue current statin lipitor 40 as declines change for now  Followup: Return in about 1 year (around 10/16/2021).  Cathlean Cower, MD 10/21/2020 1:38 PM [facility]

## 2020-10-16 NOTE — Patient Instructions (Signed)
You had the flu shot today  Please continue all other medications as before, and refills have been done if requested.  Please have the pharmacy call with any other refills you may need.  Please continue your efforts at being more active, low cholesterol diet, and weight control.  You are otherwise up to date with prevention measures today.  Please keep your appointments with your specialists as you may have planned - your new Endocrinologist  Please go to the LAB at the blood drawing area for the tests to be done  You will be contacted by phone if any changes need to be made immediately.  Otherwise, you will receive a letter about your results with an explanation, but please check with MyChart first.  Please remember to sign up for MyChart if you have not done so, as this will be important to you in the future with finding out test results, communicating by private email, and scheduling acute appointments online when needed.  Please make an Appointment to return for your 1 year visit, or sooner if needed

## 2020-10-17 ENCOUNTER — Encounter: Payer: Self-pay | Admitting: Internal Medicine

## 2020-10-17 LAB — VARICELLA ZOSTER ANTIBODY, IGG: Varicella IgG: 603.6 index

## 2020-10-21 ENCOUNTER — Encounter: Payer: Self-pay | Admitting: Internal Medicine

## 2020-10-21 NOTE — Assessment & Plan Note (Signed)
Lab Results  Component Value Date   HGBA1C 8.0 (H) 10/16/2020   Stable, pt to continue current medical treatment insulin - to f/u endo as planned

## 2020-10-21 NOTE — Assessment & Plan Note (Signed)
Age and sex appropriate education and counseling updated with regular exercise and diet Referrals for preventative services - for cologuard, pt to call for GYN appt soon Immunizations addressed - declines shignrix and colonoscopy Smoking counseling  - none needed Evidence for depression or other mood disorder - none significant Most recent labs reviewed. I have personally reviewed and have noted: 1) the patient's medical and social history 2) The patient's current medications and supplements 3) The patient's height, weight, and BMI have been recorded in the chart

## 2020-10-21 NOTE — Assessment & Plan Note (Signed)
Lab Results  Component Value Date   LDLCALC 84 10/16/2020   Uncontrolled, goal ldl < 70, pt to continue current statin lipitor 40 as declines change for now

## 2020-10-24 ENCOUNTER — Telehealth: Payer: Self-pay | Admitting: Neurology

## 2020-10-24 ENCOUNTER — Other Ambulatory Visit: Payer: Self-pay | Admitting: Adult Health

## 2020-10-24 NOTE — Telephone Encounter (Signed)
Pt would like a call to discuss why she is unable to get the refill on her carbidopa-levodopa (SINEMET CR) 50-200 MG tablet

## 2020-10-24 NOTE — Telephone Encounter (Signed)
Pt has asked this message be cx, she found her medication.  There is no need to respond to this message or the one from 9:57

## 2020-10-29 ENCOUNTER — Telehealth: Payer: Self-pay | Admitting: Neurology

## 2020-10-29 NOTE — Telephone Encounter (Signed)
Called pt. Relayed Dr. Clydene Fake message. She verbalized understanding. She will contact PCP. If PCP feels she needs to f.u with [place] after, she will call back.

## 2020-10-29 NOTE — Telephone Encounter (Signed)
Called pt back. Sx intermittent. This is not new. Still taking sinemet as prescribed. Fatigue in legs got worse around June. No falls. No signs of infection. Did start Gabapentin '300mg'$ , twice daily about 2-3 months ago. Aware Dr. Brett Fairy out of the office. Would like message sent to [facility] for recommendation.

## 2020-10-29 NOTE — Telephone Encounter (Signed)
Pt called, having leg fatigue, can walk 200 ft before feeling fatigue in legs. Would like a call from the nurse.

## 2020-11-07 ENCOUNTER — Telehealth: Payer: Self-pay | Admitting: Internal Medicine

## 2020-11-07 NOTE — Telephone Encounter (Signed)
If I recall, I believe she is followed per psychiatry, so this may be better to ask that provider

## 2020-11-07 NOTE — Telephone Encounter (Signed)
Patient notified

## 2020-11-07 NOTE — Telephone Encounter (Signed)
Patient wants to know if provider can send her something over to pharmacy.. says she has been having several anxiety spells recently  Pharmacy:  [facility], [place] AT [facility] OF [address]  Phone:  [phone] Fax:  [phone]

## 2020-11-13 DIAGNOSIS — F3189 Other bipolar disorder: Secondary | ICD-10-CM | POA: Diagnosis not present

## 2020-11-22 DIAGNOSIS — E1065 Type 1 diabetes mellitus with hyperglycemia: Secondary | ICD-10-CM | POA: Diagnosis not present

## 2020-11-24 DIAGNOSIS — Z1231 Encounter for screening mammogram for malignant neoplasm of breast: Secondary | ICD-10-CM | POA: Diagnosis not present

## 2020-12-12 DIAGNOSIS — F3189 Other bipolar disorder: Secondary | ICD-10-CM | POA: Diagnosis not present

## 2020-12-14 ENCOUNTER — Other Ambulatory Visit: Payer: Self-pay | Admitting: Internal Medicine

## 2020-12-14 NOTE — Telephone Encounter (Signed)
Please refill as per office routine med refill policy (all routine meds to be refilled for 3 mo or monthly (per pt preference) up to one year from last visit, then month to month grace period for 3 mo, then further med refills will have to be denied) ? ?

## 2020-12-17 ENCOUNTER — Telehealth: Payer: Self-pay | Admitting: Internal Medicine

## 2020-12-17 MED ORDER — TRAMADOL HCL 50 MG PO TABS: ORAL_TABLET | ORAL | 2 refills | Status: DC

## 2020-12-17 NOTE — Telephone Encounter (Signed)
Patient calling back to check status of refill request

## 2020-12-17 NOTE — Telephone Encounter (Signed)
1.Medication Requested: traMADol (ULTRAM) 50 MG tablet 2. Pharmacy (Name, [place]): [id number] - [place], [place] AT [facility] [address] Phone:  [phone]  Fax:  [phone]     3. On Med List: Y  4. Last Visit with PCP: 10/16/2020  5. Next visit date with PCP: 10/18/2021    Agent: Please be advised that RX refills may take up to 3 business days. We ask that you follow-up with your pharmacy.

## 2020-12-19 ENCOUNTER — Telehealth: Payer: Self-pay | Admitting: Neurology

## 2020-12-19 DIAGNOSIS — R29898 Other symptoms and signs involving the musculoskeletal system: Secondary | ICD-10-CM

## 2020-12-19 DIAGNOSIS — G2119 Other drug induced secondary parkinsonism: Secondary | ICD-10-CM

## 2020-12-19 NOTE — Telephone Encounter (Signed)
I am worried that weakness can mean heaviness,spasticity, neuropathy or myopathy. Lets do NCV and EMG.

## 2020-12-19 NOTE — Telephone Encounter (Signed)
Pt called states the leg fatigue is much worse. Pt requesting a call back.

## 2020-12-19 NOTE — Addendum Note (Signed)
Addended by: Darleen Crocker on: 12/19/2020 04:20 PM   Modules accepted: Orders

## 2020-12-19 NOTE — Telephone Encounter (Signed)
Called the pt back. Advised Dr Dohmeier recommends completed NCV/EMG to assess for muscle damage. Pt verbalized understanding and is agreeable to the plan. Advised order will be placed and someone will be in touch to get her set up with the procedure.

## 2020-12-19 NOTE — Telephone Encounter (Signed)
Called the patient back. Per previous documentation from when she called in September with this concern. Pt was advised to reach out to pcp to ensure there was nothing else causing this. Pt was advised to do this but states she never contacted PCP. She states that they would just have her call here to discuss.   I asked her to describe what she means by leg fatigue.  Patient states an example is when she goes to the grocery store and she is holding onto a cart after a few steps she finds that it is difficult to ambulate.  She states that her legs become tired and her walk is uncoordinated.  Patient denies falling but states it feels like her legs are going to give out.  Patient states the coordination is a continued symptom that has been a problem before but the fatigue/giving out feeling is new.  I advised that I had spoke with Dr. Brett Fairy prior to calling who agreed that the patient should contact primary care per Dr.Sethi recommendation.  I advised I would let Dr. Brett Fairy know what she describes and will reach out to her if she has a recommendation. Advised that she may still recommend follow up with primary care but will call back with what her thoughts are.

## 2020-12-20 NOTE — Progress Notes (Signed)
Cardiology Office Note   Date:  12/21/2020   ID:  Lynn Barnes, DOB 1960/03/20, MRN [id number]  PCP:  Biagio Borg, MD  Cardiologist:   Dorris Carnes, MD    Pt returns for f/u of autonomic dysfunction.   History of Present Illness: Lynn Barnes is a 60 y.o. female with a history of parkinsons and autonomic dysfunciton   I saw her in  The fall of 2020  Since seen she says she has been doing pretty good   She did have a syncopal spell about 1 month ago  Says she is drinknig   Appetite is OK    I saw the pt in NOv 2021  Since seen the pt says she has actually been doing well   Rare dizziness  Breathing is good   No CP   Current Meds  Medication Sig   atorvastatin (LIPITOR) 40 MG tablet TAKE 1 TABLET(40 MG) BY MOUTH DAILY   busPIRone (BUSPAR) 10 MG tablet Take 10 mg by mouth 3 (three) times daily.   carbidopa-levodopa (SINEMET CR) 50-200 MG tablet Take 1 tablet by mouth 2 (two) times daily.   fludrocortisone (FLORINEF) 0.1 MG tablet TAKE 1/2 TABLET BY MOUTH TWICE DAILY. PLEASE KEEP UPCOMING APPT IN NOVEMBER WITH*DR. Hamzeh Tall BEFORE ANYMORE REFILLS. THANK YOU   Glucagon 3 MG/DOSE POWD Place into the nose.   glucose blood test strip 1 each by Other route. 7 times per day   - as instructed   HUMALOG KWIKPEN 100 UNIT/ML KwikPen INJECT BEFORE EACH MEAL WITH 1 UNIT PER 10 GM CARBS PLUS CORRECTIN OF 1 UNIT PER 50 MG   insulin degludec (TRESIBA) 100 UNIT/ML FlexTouch Pen Inject 27 Units into the skin daily.    LATUDA 120 MG TABS Take 1 tablet by mouth at bedtime.   pindolol (VISKEN) 5 MG tablet Take 0.5 tablets (2.5 mg total) by mouth 2 (two) times daily.   risperidone (RISPERDAL) 4 MG tablet Take 8 mg by mouth at bedtime.   traMADol (ULTRAM) 50 MG tablet TAKE 1 TABLET(50 MG) BY MOUTH EVERY 6 HOURS AS NEEDED FOR MODERATE PAIN   zolpidem (AMBIEN) 10 MG tablet Take 10 mg by mouth at bedtime.     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 ([place])    Bipolar disorder ([place]) 02/15/2011   Cervical spondylosis without myelopathy    Depression    Depression    Diabetes mellitus    sees Dr Altheimer/endo   Dysautonomia ([place]) 10/24/2011   Dyskinesia     subacute,due to drugs   Dyslipidemia    History of nuclear stress test    Myoview 11/16: EF 81%, no ischemia; Low Risk   Hyperlipidemia    IBS (irritable bowel syndrome) 02/15/2011   Insomnia 02/21/2011   Multinodular thyroid    with large dominant solid nodule with calcifications in the lower left pole benign by FNA in 1/12 and stable by repeat ulltrasound in 4/13   Neuropathy    with paresthesias in feet   Orthostatic hypotension    Orthostatic hypotension    Parkinsonism (HCC)    due to prolonged prozac/risperdl for yrs/Dr Dohmeir   Parkinsonism ([place])    POTS (  postural orthostatic tachycardia syndrome)    Radiculopathy    cervical   Resting tremor    Rotator cuff syndrome of left shoulder    S/P partial hysterectomy    Spondylosis without myelopathy    Subacute dyskinesia due to drugs(333.85)    dr dohmeier/neuro   Tachycardia    syndrome   Therapy    on therapy by psychiatrist using sebutex    Thyroid nodule    s/p biopsy - benign approx jan 2012   Tremor     Past Surgical History:  Procedure Laterality Date   ACROMIOPLASTY Left 2002   Dr Gladstone Lighter   BREAST EXCISIONAL BIOPSY Right    benign   BREAST LUMPECTOMY Right    herniated disc cervical spine     lower back  1992   OOPHORECTOMY     PARTIAL HYSTERECTOMY     SHOULDER SURGERY Left    SPINE SURGERY  feb 2012   c5-6 fusion, Dr Rolena Infante   THYROID LOBECTOMY  11/11   nodule biopsy, benign     Social History:  The patient  reports that she has never smoked. She has never used smokeless tobacco.  She reports that she does not drink alcohol and does not use drugs.   Family History:  The patient's family history includes ALS in her paternal grandmother; Heart disease in her father and paternal grandmother; Hypertension in her father and sister; Lung cancer in her mother; Pneumonia in her father.    ROS:  Please see the history of present illness. All other systems are reviewed and  Negative to the above problem except as noted.    PHYSICAL EXAM: VS:  BP 118/64   Pulse 81   Ht 5\' 7"  (1.702 m)   Wt 169 lb 9.6 oz (76.9 kg)   SpO2 93%   BMI 26.56 kg/m    GEN: Well nourished, well developed, in no acute distress  HEENT: normal  Neck: JVP is normal  No  carotid bruits, Cardiac: RRR; no murmurs,no LE  edema  Respiratory:  clear to auscultation bilaterally, GI: soft, nontender, nondistended, + BS  No hepatomegaly  MS: no deformity Moving all extremities   Skin: warm and dry  Tanned Neuro:  Deferred  Psych: euthymic mood, full affect   EKG:  EKG is  ordered today.   NSR 81 bpm     Lipid Panel    Component Value Date/Time   CHOL 162 10/16/2020 1444   CHOL 156 12/22/2019 1506   TRIG 71.0 10/16/2020 1444   HDL 63.80 10/16/2020 1444   HDL 56 12/22/2019 1506   CHOLHDL 3 10/16/2020 1444   VLDL 14.2 10/16/2020 1444   LDLCALC 84 10/16/2020 1444   LDLCALC 81 12/22/2019 1506      Wt Readings from Last 3 Encounters:  12/21/20 169 lb 9.6 oz (76.9 kg)  10/16/20 164 lb (74.4 kg)  09/07/20 163 lb (73.9 kg)      ASSESSMENT AND PLAN:  1  Autonomic dysfunction Pt is doing fairly well from standpoint of BP   Only occsaional dizziness Keep on same regimen   Refills provided F/U in the summer    2  Dyslipidemia  Continue lipitor    3  Parkinson's   Continue on Sinemet 4  DM   Follows with M Altheimer    Sugar low today        Current medicines are reviewed at length with the patient today.  The patient does not have concerns regarding medicines.  Signed, Dorris Carnes, MD   12/21/2020 10:30 PM    [facility] [place], [place], [place]  [address] Phone: [phone]; Fax: [phone]

## 2020-12-21 ENCOUNTER — Other Ambulatory Visit: Payer: Self-pay

## 2020-12-21 ENCOUNTER — Ambulatory Visit: Payer: Medicare PPO | Admitting: Internal Medicine

## 2020-12-21 ENCOUNTER — Encounter: Payer: Self-pay | Admitting: Internal Medicine

## 2020-12-21 VITALS — BP 118/64 | HR 81 | Ht 67.0 in | Wt 169.6 lb

## 2020-12-21 DIAGNOSIS — E785 Hyperlipidemia, unspecified: Secondary | ICD-10-CM

## 2020-12-21 DIAGNOSIS — G20 Parkinson's disease: Secondary | ICD-10-CM

## 2020-12-21 DIAGNOSIS — G90A Postural orthostatic tachycardia syndrome (POTS): Secondary | ICD-10-CM

## 2020-12-21 NOTE — Patient Instructions (Signed)
Medication Instructions:  Your physician recommends that you continue on your current medications as directed. Please refer to the Current Medication list given to you today.  *If you need a refill on your cardiac medications before your next appointment, please call your pharmacy*   Lab Work: [place]   If you have labs (blood work) drawn today and your tests are completely normal, you will receive your results only by: [place] (if you have MyChart) OR A paper copy in the mail If you have any lab test that is abnormal or we need to change your treatment, we will call you to review the results.   Testing/Procedures: NONE ORDERED  TODAY    Follow-Up: At [facility], you and your health needs are our priority.  As part of our continuing mission to provide you with exceptional heart care, we have created designated Provider Care Teams.  These Care Teams include your primary Cardiologist (physician) and Advanced Practice Providers (APPs -  Physician Assistants and Nurse Practitioners) who all work together to provide you with the care you need, when you need it.  We recommend signing up for the patient portal called "MyChart".  Sign up information is provided on this After Visit Summary.  MyChart is used to connect with patients for Virtual Visits (Telemedicine).  Patients are able to view lab/test results, encounter notes, upcoming appointments, etc.  Non-urgent messages can be sent to your provider as well.   To learn more about what you can do with MyChart, go to [email or web address].    Your next appointment:   11 month(s)  The format for your next appointment:   In Person  Provider:   Dorris Carnes, MD   {  Other Instructions

## 2020-12-25 DIAGNOSIS — F3189 Other bipolar disorder: Secondary | ICD-10-CM | POA: Diagnosis not present

## 2020-12-27 ENCOUNTER — Ambulatory Visit: Payer: Medicare PPO | Admitting: Family Medicine

## 2020-12-27 ENCOUNTER — Ambulatory Visit (INDEPENDENT_AMBULATORY_CARE_PROVIDER_SITE_OTHER): Payer: Medicare PPO

## 2020-12-27 ENCOUNTER — Other Ambulatory Visit: Payer: Self-pay

## 2020-12-27 ENCOUNTER — Ambulatory Visit: Payer: Self-pay

## 2020-12-27 VITALS — BP 140/80 | HR 80 | Ht 67.0 in | Wt 170.0 lb

## 2020-12-27 DIAGNOSIS — M25562 Pain in left knee: Secondary | ICD-10-CM

## 2020-12-27 DIAGNOSIS — G8929 Other chronic pain: Secondary | ICD-10-CM

## 2020-12-27 DIAGNOSIS — M542 Cervicalgia: Secondary | ICD-10-CM

## 2020-12-27 NOTE — Patient Instructions (Addendum)
Thank you for coming in today.   I've referred you to Physical Therapy.  Let us know if you don't hear from them in one week.   Please get an Xray today before you leave   You received a steroid injection in your left knee today. Seek immediate medical attention if the joint becomes red, extremely painful, or is oozing fluid.   Recheck back in 2 months

## 2020-12-27 NOTE — Progress Notes (Signed)
I, Peterson Lombard, LAT, ATC acting as a scribe for Lynne Leader, MD.  Subjective:    CC: Neck/upper back and L knee pain  HPI: Pt is a 60 y/o female c/o neck and upper back pain ongoing ever since her fusion 5 years ago. Pt locates pain to midline of the neck and T-spine and across bilat scapular areas.  Radiates: no UE numbness/tingling: no UE weakness: no Aggravates: house work,  Treatments tried: tramadol, IBU, Tylenol  Pt also c/o L knee pain x 2-3 month w/ no MOI. Pt locates pain to the anterior aspect of the L knee.  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  Diabetes.   Objective:    Vitals:   12/27/20 1419  BP: 140/80  Pulse: 80  SpO2: 99%   General: Well Developed, well nourished, and in no acute distress.   MSK: C-spine normal-appearing Nontender midline. Tender palpation cervical paraspinal musculature. Upper extremity strength is intact.  Left knee: Normal-appearing Normal motion with crepitation. Tender palpation medial joint line. Stable ligamentous exam. Intact strength.  Lab and Radiology Results  Procedure: Real-time Ultrasound Guided Injection of left knee superior lateral patellar space Device: Philips Affiniti 50G Images permanently stored and available for review in PACS Ultrasound evaluation prior to injection reveals mild joint effusion.  Narrowed medial joint line and narrowed lateral joint line.  Small Baker's cyst present. Verbal informed consent obtained.  Discussed risks and benefits of procedure. Warned about infection bleeding damage to structures skin hypopigmentation and fat atrophy among others. Patient expresses understanding and agreement Time-out conducted.   Noted no overlying  erythema, induration, or other signs of local infection.   Skin prepped in a sterile fashion.   Local anesthesia: Topical Ethyl chloride.   With sterile technique and under real time ultrasound guidance: 40 mg of Kenalog and 2 mL of Marcaine injected into knee joint. Fluid seen entering the joint capsule.   Completed without difficulty   Pain immediately resolved suggesting accurate placement of the medication.   Advised to call if fevers/chills, erythema, induration, drainage, or persistent bleeding.   Images permanently stored and available for review in the ultrasound unit.  Impression: Technically successful ultrasound guided injection.    X-ray images left knee and C-spine obtained today personally and independently interpreted  Left knee: Mild medial compartment DJD.  Chondrocalcinosis present both medial and lateral compartment.  No acute fractures.  C-spine: Anterior fusion C5-6 appears solid with no hardware abnormality.  DDD C6-7 and C7-T1 present.  No acute fractures.  Await formal radiology review    Impression and Recommendations:    Assessment and Plan: 60 y.o. female with left knee pain thought to be due to exacerbation of DJD with possible degenerative meniscus tear.  Plan for steroid injection today and referral to physical therapy.  Also recommend Voltaren gel.  Recheck in about 2 months.  Chronic neck pain: Thought to be due to degenerative changes in the cervical spine as well as fundamental cervical paraspinal muscle dysfunction.  She did have a trial of physical therapy immediately following her cervical fusion about 5 years ago but none since.  She is a good candidate for retrial of physical therapy.  Plan for PT trial.  She does note that she has some transportation difficulties but we found a physical  therapy location nearby where she lives and she thinks that she will be able to do physical therapy once a week there ([place]).  If not improved with trial of PT  consider MRI cervical spine for facet injection planning.  PDMP not reviewed this encounter. Orders Placed This Encounter  Procedures   DG Knee AP/LAT W/Sunrise Left    Standing Status:   Future    Number of Occurrences:   1    Standing Expiration Date:   12/27/2021    Order Specific Question:   Reason for Exam (SYMPTOM  OR DIAGNOSIS REQUIRED)    Answer:   left knee pain    Order Specific Question:   Preferred imaging location?    Answer:   [facility]   [place] LIMITED JOINT SPACE STRUCTURES LOW LEFT(NO LINKED CHARGES)    Standing Status:   Future    Number of Occurrences:   1    Standing Expiration Date:   06/26/2021    Order Specific Question:   Reason for Exam (SYMPTOM  OR DIAGNOSIS REQUIRED)    Answer:   left knee pain    Order Specific Question:   Preferred imaging location?    Answer:   [facility]   DG Cervical Spine Complete    Standing Status:   Future    Number of Occurrences:   1    Standing Expiration Date:   12/27/2021    Order Specific Question:   Reason for Exam (SYMPTOM  OR DIAGNOSIS REQUIRED)    Answer:   neck pain    Order Specific Question:   Preferred imaging location?    Answer:   [facility]   Ambulatory referral to Physical Therapy    Referral Priority:   Routine    Referral Type:   Physical Medicine    Referral Reason:   Specialty Services Required    Requested Specialty:   Physical Therapy    Number of Visits Requested:   1   No orders of the defined types were placed in this encounter.   Discussed warning signs or symptoms. Please see discharge instructions. Patient expresses understanding.   The above documentation has been reviewed and is accurate and complete Lynne Leader, M.D.

## 2020-12-28 ENCOUNTER — Other Ambulatory Visit: Payer: Self-pay | Admitting: Internal Medicine

## 2020-12-31 NOTE — Progress Notes (Signed)
Fusion at C5-6 is stable.  Increased degenerative changes are present around the fusion.

## 2020-12-31 NOTE — Progress Notes (Signed)
Left knee x-ray shows chondrocalcinosis.  This can be due to pseudogout which can cause knee pain at times.  The cortisone shot that I gave you on November 17 should help.

## 2021-01-09 DIAGNOSIS — B351 Tinea unguium: Secondary | ICD-10-CM | POA: Diagnosis not present

## 2021-01-09 DIAGNOSIS — M792 Neuralgia and neuritis, unspecified: Secondary | ICD-10-CM | POA: Diagnosis not present

## 2021-01-09 DIAGNOSIS — Z79899 Other long term (current) drug therapy: Secondary | ICD-10-CM | POA: Diagnosis not present

## 2021-01-09 DIAGNOSIS — E1151 Type 2 diabetes mellitus with diabetic peripheral angiopathy without gangrene: Secondary | ICD-10-CM | POA: Diagnosis not present

## 2021-01-09 DIAGNOSIS — F319 Bipolar disorder, unspecified: Secondary | ICD-10-CM | POA: Diagnosis not present

## 2021-01-09 DIAGNOSIS — G2119 Other drug induced secondary parkinsonism: Secondary | ICD-10-CM | POA: Diagnosis not present

## 2021-01-09 DIAGNOSIS — S9032XA Contusion of left foot, initial encounter: Secondary | ICD-10-CM | POA: Diagnosis not present

## 2021-01-17 DIAGNOSIS — F3189 Other bipolar disorder: Secondary | ICD-10-CM | POA: Diagnosis not present

## 2021-01-17 DIAGNOSIS — H35361 Drusen (degenerative) of macula, right eye: Secondary | ICD-10-CM | POA: Diagnosis not present

## 2021-01-17 DIAGNOSIS — H40013 Open angle with borderline findings, low risk, bilateral: Secondary | ICD-10-CM | POA: Diagnosis not present

## 2021-01-17 DIAGNOSIS — H04223 Epiphora due to insufficient drainage, bilateral lacrimal glands: Secondary | ICD-10-CM | POA: Diagnosis not present

## 2021-01-17 DIAGNOSIS — E103293 Type 1 diabetes mellitus with mild nonproliferative diabetic retinopathy without macular edema, bilateral: Secondary | ICD-10-CM | POA: Diagnosis not present

## 2021-01-17 LAB — HM DIABETES EYE EXAM

## 2021-01-31 ENCOUNTER — Other Ambulatory Visit: Payer: Self-pay | Admitting: Internal Medicine

## 2021-02-12 DIAGNOSIS — E1065 Type 1 diabetes mellitus with hyperglycemia: Secondary | ICD-10-CM | POA: Diagnosis not present

## 2021-02-19 DIAGNOSIS — F3189 Other bipolar disorder: Secondary | ICD-10-CM | POA: Diagnosis not present

## 2021-02-21 ENCOUNTER — Encounter: Payer: Medicare PPO | Admitting: Diagnostic Neuroimaging

## 2021-02-21 ENCOUNTER — Ambulatory Visit (INDEPENDENT_AMBULATORY_CARE_PROVIDER_SITE_OTHER): Payer: Medicare PPO | Admitting: Diagnostic Neuroimaging

## 2021-02-21 DIAGNOSIS — R29898 Other symptoms and signs involving the musculoskeletal system: Secondary | ICD-10-CM

## 2021-02-21 DIAGNOSIS — G2119 Other drug induced secondary parkinsonism: Secondary | ICD-10-CM | POA: Diagnosis not present

## 2021-02-21 DIAGNOSIS — Z0289 Encounter for other administrative examinations: Secondary | ICD-10-CM

## 2021-02-21 NOTE — Procedures (Signed)
[facility]  NCS (NERVE CONDUCTION STUDY) WITH EMG (ELECTROMYOGRAPHY) REPORT   STUDY DATE: 02/22/11 PATIENT NAME: Lynn Barnes DOB: [birth year] MRN: [id number]  ORDERING CLINICIAN: [name], MD   TECHNOLOGIST: Sherre Scarlet ELECTROMYOGRAPHER: Earlean Polka. Reganne Messerschmidt, MD  CLINICAL INFORMATION: 61 year old female with bilateral leg fatigue and incoordination.  FINDINGS: NERVE CONDUCTION STUDY:  Bilateral peroneal and tibial motor responses are normal.  Bilateral tibial F wave latencies are normal.  Bilateral sural sensory responses are normal.  Bilateral superficial peroneal sensory responses could not be obtained.   NEEDLE ELECTROMYOGRAPHY:  Needle examination of left lower extremity is normal.   IMPRESSION:   Overall normal nerve conduction studies and left leg EMG.  Bilateral superficial peroneal sensory responses could not be obtained, but this could be due to technical limitations.   INTERPRETING PHYSICIAN:  Penni Bombard, MD Certified in Neurology, Neurophysiology and Neuroimaging  [facility] [address] [place], [place] [address] [phone]  [facility]    Nerve / Sites Muscle Latency Ref. Amplitude Ref. Rel Amp Segments Distance Velocity Ref. Area    ms ms mV mV %  cm m/s m/s mVms  R Peroneal - EDB     Ankle EDB 5.0 ?6.5 3.6 ?2.0 100 Ankle - EDB 9   12.6     Fib head EDB 11.0  2.9  80.9 Fib head - Ankle 27 45 ?44 11.1     Pop fossa EDB 13.2  2.7  93.9 Pop fossa - Fib head 10 45 ?44 10.8         Pop fossa - Ankle      L Peroneal - EDB     Ankle EDB 4.5 ?6.5 2.2 ?2.0 100 Ankle - EDB 9   8.3     Fib head EDB 10.8  1.9  86.1 Fib head - Ankle 28 45 ?44 8.3     Pop fossa EDB 13.1  1.8  96 Pop fossa - Fib head 10 44 ?44 9.5         Pop fossa - Ankle      R Tibial - AH     Ankle AH 3.4 ?5.8 4.3 ?4.0 100 Ankle - AH 9   7.7     Pop fossa AH 12.4  3.0  68.8 Pop fossa - Ankle 37 41 ?41 9.2  L Tibial - AH      Ankle AH 3.4 ?5.8 8.5 ?4.0 100 Ankle - AH 9   17.7     Pop fossa AH 12.2  5.9  69 Pop fossa - Ankle 38 43 ?41 18.6             SNC    Nerve / Sites Rec. Site Peak Lat Ref.  Amp Ref. Segments Distance    ms ms V V  cm  R Sural - Ankle (Calf)     Calf Ankle 3.5 ?4.4 9 ?6 Calf - Ankle 14  L Sural - Ankle (Calf)     Calf Ankle 2.4 ?4.4 13 ?6 Calf - Ankle 14  R Superficial peroneal - Ankle     Lat leg Ankle NR ?4.4 NR ?6 Lat leg - Ankle 14  L Superficial peroneal - Ankle     Lat leg Ankle NR ?4.4 NR ?6 Lat leg - Ankle 14             F  Wave    Nerve F Lat Ref.   ms ms  R Tibial -  AH 54.1 ?56.0  L Tibial - AH 53.0 ?56.0         EMG Summary Table    Spontaneous MUAP Recruitment  Muscle IA Fib PSW Fasc Other Amp Dur. Poly Pattern  L. Vastus medialis Normal None None None _______ Normal Normal Normal Normal  L. Tibialis anterior Normal None None None _______ Normal Normal Normal Normal  L. Gastrocnemius (Medial head) Normal None None None _______ Normal Normal Normal Normal

## 2021-02-26 ENCOUNTER — Ambulatory Visit: Payer: Medicare PPO | Admitting: Family Medicine

## 2021-03-12 ENCOUNTER — Telehealth: Payer: Self-pay

## 2021-03-12 MED ORDER — TRAMADOL HCL 50 MG PO TABS: ORAL_TABLET | ORAL | 2 refills | Status: DC

## 2021-03-12 NOTE — Telephone Encounter (Signed)
Pt is calling requesting a refill on: traMADol (ULTRAM) 50 MG tablet  Pharmacy: [id number] [address], [address] AT [address]  LOV 10/16/20  Pt CB [phone]

## 2021-03-13 DIAGNOSIS — M205X1 Other deformities of toe(s) (acquired), right foot: Secondary | ICD-10-CM | POA: Diagnosis not present

## 2021-03-13 DIAGNOSIS — E1151 Type 2 diabetes mellitus with diabetic peripheral angiopathy without gangrene: Secondary | ICD-10-CM | POA: Diagnosis not present

## 2021-03-13 DIAGNOSIS — M792 Neuralgia and neuritis, unspecified: Secondary | ICD-10-CM | POA: Diagnosis not present

## 2021-03-13 DIAGNOSIS — B351 Tinea unguium: Secondary | ICD-10-CM | POA: Diagnosis not present

## 2021-03-19 DIAGNOSIS — F3189 Other bipolar disorder: Secondary | ICD-10-CM | POA: Diagnosis not present

## 2021-04-01 DIAGNOSIS — L02611 Cutaneous abscess of right foot: Secondary | ICD-10-CM | POA: Diagnosis not present

## 2021-04-01 DIAGNOSIS — M792 Neuralgia and neuritis, unspecified: Secondary | ICD-10-CM | POA: Diagnosis not present

## 2021-04-01 DIAGNOSIS — B351 Tinea unguium: Secondary | ICD-10-CM | POA: Diagnosis not present

## 2021-04-01 DIAGNOSIS — E1151 Type 2 diabetes mellitus with diabetic peripheral angiopathy without gangrene: Secondary | ICD-10-CM | POA: Diagnosis not present

## 2021-04-01 DIAGNOSIS — M205X1 Other deformities of toe(s) (acquired), right foot: Secondary | ICD-10-CM | POA: Diagnosis not present

## 2021-04-16 DIAGNOSIS — L03031 Cellulitis of right toe: Secondary | ICD-10-CM | POA: Diagnosis not present

## 2021-05-01 DIAGNOSIS — F3189 Other bipolar disorder: Secondary | ICD-10-CM | POA: Diagnosis not present

## 2021-05-03 DIAGNOSIS — E1065 Type 1 diabetes mellitus with hyperglycemia: Secondary | ICD-10-CM | POA: Diagnosis not present

## 2021-05-09 DIAGNOSIS — E1151 Type 2 diabetes mellitus with diabetic peripheral angiopathy without gangrene: Secondary | ICD-10-CM | POA: Diagnosis not present

## 2021-05-09 DIAGNOSIS — L03031 Cellulitis of right toe: Secondary | ICD-10-CM | POA: Diagnosis not present

## 2021-05-21 DIAGNOSIS — E782 Mixed hyperlipidemia: Secondary | ICD-10-CM | POA: Diagnosis not present

## 2021-05-21 DIAGNOSIS — E1065 Type 1 diabetes mellitus with hyperglycemia: Secondary | ICD-10-CM | POA: Diagnosis not present

## 2021-05-24 DIAGNOSIS — E1065 Type 1 diabetes mellitus with hyperglycemia: Secondary | ICD-10-CM | POA: Diagnosis not present

## 2021-05-24 DIAGNOSIS — E10649 Type 1 diabetes mellitus with hypoglycemia without coma: Secondary | ICD-10-CM | POA: Insufficient documentation

## 2021-05-24 DIAGNOSIS — E041 Nontoxic single thyroid nodule: Secondary | ICD-10-CM | POA: Diagnosis not present

## 2021-05-24 DIAGNOSIS — E1042 Type 1 diabetes mellitus with diabetic polyneuropathy: Secondary | ICD-10-CM | POA: Diagnosis not present

## 2021-05-27 DIAGNOSIS — D485 Neoplasm of uncertain behavior of skin: Secondary | ICD-10-CM | POA: Diagnosis not present

## 2021-05-27 DIAGNOSIS — C4441 Basal cell carcinoma of skin of scalp and neck: Secondary | ICD-10-CM | POA: Diagnosis not present

## 2021-05-28 ENCOUNTER — Ambulatory Visit: Payer: Medicare PPO | Admitting: Family Medicine

## 2021-05-28 NOTE — Progress Notes (Deleted)
   I, Peterson Lombard, LAT, ATC acting as a scribe for Lynne Leader, MD.  Lynn Barnes is a 61 y.o. female who presents to [place] at [facility] today for L hip pain Pt was last seen by Dr. Georgina Snell on 12/27/20 for L knee and neck and upper back pain. Pt was seen by Dr. Tamala Julian in 2015 for lumbar radiculopathy and R hip pain. Today, pt c/o L hip pain  x /. Pt locates pain to   Low back pain: Radiating pain: LE numbness/tingling: LE weakness: Aggravates: Treatments tried:  Dx imaging:11/29/13 Lumbar CT  10/25/13 L-spine & R hip XR  Pertinent review of systems: ***  Relevant historical information: ***   Exam:  There were no vitals taken for this visit. General: Well Developed, well nourished, and in no acute distress.   MSK: ***    Lab and Radiology Results No results found for this or any previous visit (from the past 72 hour(s)). No results found.     Assessment and Plan: 61 y.o. female with ***   PDMP not reviewed this encounter. No orders of the defined types were placed in this encounter.  No orders of the defined types were placed in this encounter.    Discussed warning signs or symptoms. Please see discharge instructions. Patient expresses understanding.   ***

## 2021-05-29 ENCOUNTER — Encounter: Payer: Self-pay | Admitting: *Deleted

## 2021-05-29 ENCOUNTER — Ambulatory Visit (INDEPENDENT_AMBULATORY_CARE_PROVIDER_SITE_OTHER): Payer: Medicare PPO

## 2021-05-29 ENCOUNTER — Ambulatory Visit: Payer: Medicare PPO | Admitting: Family Medicine

## 2021-05-29 VITALS — BP 128/84 | HR 80 | Ht 67.0 in | Wt 167.0 lb

## 2021-05-29 DIAGNOSIS — M5440 Lumbago with sciatica, unspecified side: Secondary | ICD-10-CM

## 2021-05-29 DIAGNOSIS — M545 Low back pain, unspecified: Secondary | ICD-10-CM | POA: Diagnosis not present

## 2021-05-29 MED ORDER — PREDNISONE 10 MG PO TABS: 30.0000 mg | ORAL_TABLET | Freq: Every day | ORAL | 0 refills | Status: DC

## 2021-05-29 MED ORDER — GABAPENTIN 800 MG PO TABS: 800.0000 mg | ORAL_TABLET | Freq: Three times a day (TID) | ORAL | 1 refills | Status: DC

## 2021-05-29 NOTE — Patient Instructions (Addendum)
Good to see you  ?Get x-ray on your way out ?Referral to physical therapy sent ?Medications sent to pharmacy  ?Check back in 6 weeks  ? ? ?

## 2021-05-29 NOTE — Progress Notes (Signed)
? ?I, Lynn Barnes, am serving as a Education administrator for Dr. Lynne Leader. ? ?Lynn Barnes is a 61 y.o. female who presents to [place] at [facility] today c/o L hip pain. Pt was previously seen by Dr. Georgina Snell on 12/27/20 for L knee and neck pain. Pt was seen by Dr. Tamala Julian in 2015 for lumbar radiculopathy and degenerative changes in the L-spine. Today, pt reports the pain that she picked father in law off the ground when he had fallen and that was three months ago. Patient declines hearing anything pop or feeling anything pull or stretch. Pt locates pain to Left lower back radiates down the left calf and will sometime cause tingling in the front L leg. Patient describes pain as a toothache and the pain does come and go.  ? ?Low back pain: yes  ?Radiates: yes from L low back to L calf  ?LE Numbness/tingling: yes down the front of L leg to the foot ?LE Weakness: no ?Aggravates: morning when getting up  ?Treatments tried: lots of ibuprofen and tylenol  ? ?Dx imaging: 11/29/13 Lumbar CT ? 10/25/13 Lumbar & R hip XR ? ?Pertinent review of systems: No fevers or chills ? ?Relevant historical information: Type 1 diabetes. ?Currently taking gabapentin 800 twice daily for anxiety. ?Parkinson syndrome with orthostatic hypotension. ? ?Exam:  ?BP 128/84   Pulse 80   Ht '5\' 7"'$  (1.702 m)   Wt 167 lb (75.8 kg)   SpO2 96%   BMI 26.16 kg/m?  ?General: Well Developed, well nourished, and in no acute distress.  ? ?MSK: L-spine: Nontender midline. ?Decreased lumbar motion pain with extension. ?Positive left-sided slump test. ?Lower extremity strength is generally intact. ?Reflexes are intact. ?Sensation is intact. ? ? ? ? ?Lab and Radiology Results ? ?X-ray images L-spine obtained today personally and independently interpreted ?DDD worse at L5-S1.  Facet DJD L4-5 and L5-S1.  No acute fractures are present. ?Await formal radiology review ? ? ? ? ?Assessment and Plan: ?62 y.o. female with left-sided lumbar radiculopathy  thought to be likely L5 dermatomal pattern.  Plan for increasing gabapentin to 800 3 times daily, backup course for prednisone, and physical therapy.  Recheck back in 6 weeks.  If not improved neck step will be MRI very likely.  MRI would be for diagnostic in for injection planning. ?Acute exacerbation of chronic problem. ? ?PDMP not reviewed this encounter. ?Orders Placed This Encounter  ?Procedures  ? DG Lumbar Spine 2-3 Views  ?  Standing Status:   Future  ?  Number of Occurrences:   1  ?  Standing Expiration Date:   05/30/2022  ?  Order Specific Question:   Reason for Exam (SYMPTOM  OR DIAGNOSIS REQUIRED)  ?  Answer:   Low back pain  ?  Order Specific Question:   Preferred imaging location?  ?  Answer:   [facility]  ? Ambulatory referral to Physical Therapy  ?  Referral Priority:   Routine  ?  Referral Type:   Physical Medicine  ?  Referral Reason:   Specialty Services Required  ?  Requested Specialty:   Physical Therapy  ?  Number of Visits Requested:   1  ? ?Meds ordered this encounter  ?Medications  ? gabapentin (NEURONTIN) 800 MG tablet  ?  Sig: Take 1 tablet (800 mg total) by mouth 3 (three) times daily.  ?  Dispense:  270 tablet  ?  Refill:  1  ? predniSONE (DELTASONE) 10 MG tablet  ?  Sig: Take 3 tablets (30 mg total) by mouth daily with breakfast.  ?  Dispense:  15 tablet  ?  Refill:  0  ? ? ? ?Discussed warning signs or symptoms. Please see discharge instructions. Patient expresses understanding. ? ? ?The above documentation has been reviewed and is accurate and complete Lynne Leader, M.D. ? ? ?

## 2021-05-31 ENCOUNTER — Other Ambulatory Visit: Payer: Self-pay | Admitting: Internal Medicine

## 2021-06-03 NOTE — Progress Notes (Signed)
Lumbar spine x-ray shows arthritis changes

## 2021-06-13 DIAGNOSIS — C4441 Basal cell carcinoma of skin of scalp and neck: Secondary | ICD-10-CM | POA: Diagnosis not present

## 2021-06-13 DIAGNOSIS — L905 Scar conditions and fibrosis of skin: Secondary | ICD-10-CM | POA: Diagnosis not present

## 2021-06-20 DIAGNOSIS — E042 Nontoxic multinodular goiter: Secondary | ICD-10-CM | POA: Diagnosis not present

## 2021-06-25 DIAGNOSIS — F3189 Other bipolar disorder: Secondary | ICD-10-CM | POA: Diagnosis not present

## 2021-07-10 ENCOUNTER — Ambulatory Visit: Payer: Medicare PPO | Admitting: Family Medicine

## 2021-07-30 DIAGNOSIS — C44722 Squamous cell carcinoma of skin of right lower limb, including hip: Secondary | ICD-10-CM | POA: Diagnosis not present

## 2021-07-30 DIAGNOSIS — L409 Psoriasis, unspecified: Secondary | ICD-10-CM | POA: Diagnosis not present

## 2021-07-30 DIAGNOSIS — D1801 Hemangioma of skin and subcutaneous tissue: Secondary | ICD-10-CM | POA: Diagnosis not present

## 2021-07-30 DIAGNOSIS — D485 Neoplasm of uncertain behavior of skin: Secondary | ICD-10-CM | POA: Diagnosis not present

## 2021-07-31 DIAGNOSIS — E1065 Type 1 diabetes mellitus with hyperglycemia: Secondary | ICD-10-CM | POA: Diagnosis not present

## 2021-08-21 ENCOUNTER — Ambulatory Visit: Payer: Medicare PPO | Admitting: Neurology

## 2021-08-22 ENCOUNTER — Ambulatory Visit (INDEPENDENT_AMBULATORY_CARE_PROVIDER_SITE_OTHER): Payer: Medicare PPO

## 2021-08-22 DIAGNOSIS — Z0000 Encounter for general adult medical examination without abnormal findings: Secondary | ICD-10-CM | POA: Diagnosis not present

## 2021-08-22 NOTE — Progress Notes (Signed)
I connected with Lynn Barnes today by telephone and verified that I am speaking with the correct person using two identifiers. Location patient: home Location provider: work Persons participating in the virtual visit: patient, provider.   I discussed the limitations, risks, security and privacy concerns of performing an evaluation and management service by telephone and the availability of in person appointments. I also discussed with the patient that there may be a patient responsible charge related to this service. The patient expressed understanding and verbally consented to this telephonic visit.    Interactive audio and video telecommunications were attempted between this provider and patient, however failed, due to patient having technical difficulties OR patient did not have access to video capability.  We continued and completed visit with audio only.  Some vital signs may be absent or patient reported.   Time Spent with patient on telephone encounter: 30 minutes  Subjective:   Lynn Barnes is a 61 y.o. female who presents for Medicare Annual (Subsequent) preventive examination.  Review of Systems     Cardiac Risk Factors include: advanced age (>72mn, >>55women);diabetes mellitus;dyslipidemia;family history of premature cardiovascular disease     Objective:    There were no vitals filed for this visit. There is no height or weight on file to calculate BMI.     08/22/2021   11:32 AM 08/21/2020    2:10 PM 06/10/2019   12:16 PM 05/06/2019    3:46 PM 03/23/2018    2:38 PM 08/21/2014    2:57 PM  Advanced Directives  Does Patient Have a Medical Advance Directive? Yes Yes No No Yes Yes  Type of Advance Directive Living will;Healthcare Power of Attorney Living will;Healthcare Power of [place] will  Does patient want to make changes to medical advance directive? No - Patient declined No - Patient  declined      Copy of [place]? No - copy requested No - copy requested   No - copy requested No - copy requested  Would patient like information on creating a medical advance directive?    No - Patient declined      Current Medications (verified) Outpatient Encounter Medications as of 08/22/2021  Medication Sig   atorvastatin (LIPITOR) 40 MG tablet TAKE 1 TABLET(40 MG) BY MOUTH DAILY   carbidopa-levodopa (SINEMET CR) 50-200 MG tablet Take 1 tablet by mouth 2 (two) times daily.   fludrocortisone (FLORINEF) 0.1 MG tablet TAKE 1/2 TABLET BY MOUTH TWICE DAILY   gabapentin (NEURONTIN) 800 MG tablet Take 1 tablet (800 mg total) by mouth 3 (three) times daily.   Glucagon 3 MG/DOSE POWD Place into the nose.   glucose blood test strip 1 each by Other route. 7 times per day   - as instructed   HUMALOG KWIKPEN 100 UNIT/ML KwikPen INJECT BEFORE EACH MEAL WITH 1 UNIT PER 10 GM CARBS PLUS CORRECTIN OF 1 UNIT PER 50 MG   insulin degludec (TRESIBA) 100 UNIT/ML FlexTouch Pen Inject 27 Units into the skin daily.    LATUDA 120 MG TABS Take 1 tablet by mouth at bedtime.   pindolol (VISKEN) 5 MG tablet TAKE 1/2 TABLET(2.5 MG) BY MOUTH TWICE DAILY   predniSONE (DELTASONE) 10 MG tablet Take 3 tablets (30 mg total) by mouth daily with breakfast.   risperidone (RISPERDAL) 4 MG tablet Take 8 mg by mouth at bedtime.   traMADol (ULTRAM) 50 MG tablet TAKE 1 TABLET(50 MG) BY  MOUTH EVERY 6 HOURS AS NEEDED FOR MODERATE PAIN   zolpidem (AMBIEN) 10 MG tablet Take 10 mg by mouth at bedtime.   No facility-administered encounter medications on file as of 08/22/2021.    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[place])     Bipolar disorder ([place]) 02/15/2011   Cervical spondylosis without myelopathy    Depression    Depression    Diabetes mellitus    sees Dr Altheimer/endo   Dysautonomia ([place]) 10/24/2011   Dyskinesia     subacute,due to drugs   Dyslipidemia    History of nuclear stress test    Myoview 11/16: EF 81%, no ischemia; Low Risk   Hyperlipidemia    IBS (irritable bowel syndrome) 02/15/2011   Insomnia 02/21/2011   Multinodular thyroid    with large dominant solid nodule with calcifications in the lower left pole benign by FNA in 1/12 and stable by repeat ulltrasound in 4/13   Neuropathy    with paresthesias in feet   Orthostatic hypotension    Orthostatic hypotension    Parkinsonism (HCC)    due to prolonged prozac/risperdl for yrs/Dr Dohmeir   Parkinsonism (HCC)    POTS (postural orthostatic tachycardia syndrome)    Radiculopathy    cervical   Resting tremor    Rotator cuff syndrome of left shoulder    S/P partial hysterectomy    Spondylosis without myelopathy    Subacute dyskinesia due to drugs(333.85)    dr dohmeier/neuro   Tachycardia    syndrome   Therapy    on therapy by psychiatrist using sebutex    Thyroid nodule    s/p biopsy - benign approx jan 2012   Tremor    Past Surgical History:  Procedure Laterality Date   ACROMIOPLASTY Left 2002   Dr Gladstone Lighter   BREAST EXCISIONAL BIOPSY Right    benign   BREAST LUMPECTOMY Right    herniated disc cervical spine     lower back  1992   OOPHORECTOMY     PARTIAL HYSTERECTOMY     SHOULDER SURGERY Left    SPINE SURGERY  feb 2012   c5-6 fusion, Dr Rolena Infante   THYROID LOBECTOMY  11/11   nodule biopsy, benign   Family History  Problem Relation Age of Onset   Lung cancer Mother    Heart disease Father    Hypertension Father    Pneumonia Father    Heart disease Paternal Grandmother    ALS Paternal Grandmother    Hypertension Sister    Social History   Socioeconomic History   Marital status: Married    Spouse name: Not on file    Number of children: 0   Years of education: BA   Highest education level: Not on file  Occupational History   Occupation: TEACHER    Employer: [employer]  Tobacco Use   Smoking status: Never   Smokeless tobacco: Never  Vaping Use   Vaping Use: Never used  Substance and Sexual Activity   Alcohol use: No    Alcohol/week: 0.0 standard drinks of alcohol   Drug use: No   Sexual activity: Never  Other Topics Concern   Not on file  Social History  Narrative   Patient is single and lives with my partner Bearl Mulberry).   Patient is disabled.   Patient is right-handed.   Patient has a Haematologist.   Patient drinks one soda every other day.   Social Determinants of Health   Financial Resource Strain: Low Risk  (08/21/2020)   Overall Financial Resource Strain (CARDIA)    Difficulty of Paying Living Expenses: Not hard at all  Food Insecurity: No Food Insecurity (08/21/2020)   Hunger Vital Sign    Worried About Running Out of Food in the Last Year: Never true    Ran Out of Food in the Last Year: Never true  Transportation Needs: No Transportation Needs (08/21/2020)   PRAPARE - Hydrologist (Medical): No    Lack of Transportation (Non-Medical): No  Physical Activity: Sufficiently Active (08/21/2020)   Exercise Vital Sign    Days of Exercise per Week: 3 days    Minutes of Exercise per Session: 60 min  Stress: No Stress Concern Present (08/21/2020)   [place]    Feeling of Stress : Not at all  Social Connections: Moderately Isolated (08/21/2020)   Social Connection and Isolation Panel [NHANES]    Frequency of Communication with Friends and Family: More than three times a week    Frequency of Social Gatherings with Friends and Family: More than three times a week    Attends Religious Services: Never    Marine scientist or Organizations: No    Attends Programme researcher, broadcasting/film/video: Never    Marital Status: Married    Tobacco Counseling Counseling given: Not Answered   Clinical Intake:  Pre-visit preparation completed: Yes  Pain : No/denies pain     BMI - recorded: 26.16 Nutritional Status: BMI 25 -29 Overweight Nutritional Risks: None Diabetes: Yes (Type 1) CBG done?: No Did pt. bring in CBG monitor from home?: No  How often do you need to have someone help you when you read instructions, pamphlets, or other written materials from your doctor or pharmacy?: 1 - Never What is the last grade level you completed in school?: 2 years of Post Graduate; Retired Pharmacist, hospital  Diabetic? yes  Interpreter Needed?: No  Information entered by :: [employer], LPN.   Activities of Daily Living    08/22/2021   11:36 AM  In your present state of health, do you have any difficulty performing the following activities:  Hearing? 0  Vision? 0  Difficulty concentrating or making decisions? 0  Walking or climbing stairs? 0  Dressing or bathing? 0  Doing errands, shopping? 0  Preparing Food and eating ? N  Using the Toilet? N  In the past six months, have you accidently leaked urine? N  Do you have problems with loss of bowel control? N  Managing your Medications? N  Managing your Finances? N  Housekeeping or managing your Housekeeping? N    Patient Care Team: Biagio Borg, MD as PCP - General (Internal Medicine) Fay Records, MD as PCP - Cardiology (Cardiology) Dohmeier, Asencion Partridge, MD as Consulting Physician (Neurology) Altheimer, Legrand Como, MD as Referring Physician (Endocrinology) Dan Humphreys (Nurse Practitioner) Monna Fam, MD as Consulting Physician (Ophthalmology)  Indicate any recent Medical Services you may have received from other than [facility] providers in the past year (date may be approximate).     Assessment:   This is a routine wellness examination for [place].  Hearing/Vision screen Hearing  Screening -  Comments:: Patient denied any hearing difficulty.   No hearing aids.  Vision Screening - Comments:: Patient does not wear any corrective lenses/contacts.   Eye exam done by: Monna Fam, MD.   Dietary issues and exercise activities discussed: Current Exercise Habits: Home exercise routine, Time (Minutes): 30, Frequency (Times/Week): 7, Weekly Exercise (Minutes/Week): 210, Intensity: Moderate, Exercise limited by: None identified   Goals Addressed             This Visit's Progress    My goal is to keep from fallen by being very careful.        Depression Screen    08/22/2021   11:35 AM 10/16/2020    2:09 PM 10/16/2020    1:52 PM 09/07/2020    2:46 PM 08/21/2020    2:23 PM 05/06/2019    3:56 PM 09/21/2018    1:51 PM  PHQ 2/9 Scores  PHQ - 2 Score 1 0 0 0 0 0 0    Fall Risk    08/22/2021   11:33 AM 10/16/2020    2:09 PM 10/16/2020    1:52 PM 09/07/2020    2:45 PM 08/21/2020    2:26 PM  [place] in the past year? 0 0 0 0 0  Number falls in past yr: 0 0 0 0 0  Injury with Fall? 0 0 0 0 0  Risk for fall due to : No Fall Risks    No Fall Risks  Follow up Falls evaluation completed    Falls evaluation completed    FALL RISK PREVENTION PERTAINING TO THE HOME:  Any stairs in or around the home? No  If so, are there any without handrails? No  Home free of loose throw rugs in walkways, pet beds, electrical cords, etc? Yes  Adequate lighting in your home to reduce risk of falls? Yes   ASSISTIVE DEVICES UTILIZED TO PREVENT FALLS:  Life alert? No  Use of a cane, walker or w/c? No  Grab bars in the bathroom? Yes  Shower chair or bench in shower? Yes  Elevated toilet seat or a handicapped toilet? Yes   TIMED UP AND GO:  Was the test performed? No .  Length of time to ambulate 10 feet: n/a sec.   Appearance of gait: Gait not evaluated during this visit.  Cognitive Function:    08/21/2014    3:26 PM  MMSE - Mini Mental State Exam  Orientation to time 5   Orientation to Place 5  Registration 3  Attention/ Calculation 5  Recall 3  Language- name 2 objects 2  Language- repeat 1  Language- follow 3 step command 2  Language- read & follow direction 1  Write a sentence 1  Copy design 1  Total score 29      04/02/2016    3:45 PM 02/21/2015    3:37 PM  Montreal Cognitive Assessment   Visuospatial/ Executive (0/5) 5 5  Naming (0/3) 3 3  Attention: Read list of digits (0/2) 2 2  Attention: Read list of letters (0/1) 1 1  Attention: Serial 7 subtraction starting at 100 (0/3) 3 3  Language: Repeat phrase (0/2) 2 2  Language : Fluency (0/1) 0 0  Abstraction (0/2) 2 2  Delayed Recall (0/5) 2 3  Orientation (0/6) 5 5  Total 25 26  Adjusted Score (based on education) 25 26      08/22/2021   11:39 AM 05/06/2019    3:57 PM  6CIT Screen  What Year? 0 points 0 points  What month? 0 points 0 points  What time? 0 points 0 points  Count back from 20 0 points 0 points  Months in reverse 0 points 0 points  Repeat phrase 0 points 0 points  Total Score 0 points 0 points    Immunizations Immunization History  Administered Date(s) Administered   Influenza Inj Mdck Quad Pf 10/29/2018, 10/29/2018   Influenza Whole 11/11/2007, 04/24/2008   Influenza, Seasonal, Injecte, Preservative Fre 11/10/2012   Influenza,inj,Quad PF,6+ Mos 09/27/2013, 10/11/2019, 10/16/2020   Influenza-Unspecified 11/13/2017, 10/29/2018   Moderna Sars-Covid-2 Vaccination 05/09/2019, 06/11/2019, 11/11/2019, 08/31/2020   Pneumococcal Conjugate-13 08/21/2016   Pneumococcal Polysaccharide-23 08/24/2017    TDAP status: Due, Education has been provided regarding the importance of this vaccine. Advised may receive this vaccine at local pharmacy or Health Dept. Aware to provide a copy of the vaccination record if obtained from local pharmacy or Health Dept. Verbalized acceptance and understanding.  Flu Vaccine status: Up to date  Pneumococcal vaccine status: Up to  date  Covid-19 vaccine status: Completed vaccines  Qualifies for Shingles Vaccine? Yes   Zostavax completed No   Shingrix Completed?: No.    Education has been provided regarding the importance of this vaccine. Patient has been advised to call insurance company to determine out of pocket expense if they have not yet received this vaccine. Advised may also receive vaccine at local pharmacy or Health Dept. Verbalized acceptance and understanding.  Screening Tests Health Maintenance  Topic Date Due   PAP SMEAR-Modifier  09/05/2008   Zoster Vaccines- Shingrix (1 of 2) Never done   COVID-19 Vaccine (5 - Moderna series) 10/26/2020   TETANUS/TDAP  12/11/2020   HEMOGLOBIN A1C  04/15/2021   COLONOSCOPY (Pts 45-59yr Insurance coverage will need to be confirmed)  10/16/2021 (Originally 05/14/2016)   INFLUENZA VACCINE  09/10/2021   Diabetic kidney evaluation - GFR measurement  10/16/2021   Diabetic kidney evaluation - Urine ACR  10/16/2021   FOOT EXAM  10/16/2021   OPHTHALMOLOGY EXAM  01/17/2022   MAMMOGRAM  01/30/2022   Hepatitis C Screening  Completed   HIV Screening  Completed   HPV VACCINES  Aged Out    Health Maintenance  Health Maintenance Due  Topic Date Due   PAP SMEAR-Modifier  09/05/2008   Zoster Vaccines- Shingrix (1 of 2) Never done   COVID-19 Vaccine (5 - Moderna series) 10/26/2020   TETANUS/TDAP  12/11/2020   HEMOGLOBIN A1C  04/15/2021    Colorectal cancer screening: Type of screening: Colonoscopy. Completed 05/15/2011. Repeat every 5 years (Patient declined)  Mammogram status: Completed 11/24/2020. Repeat every year  Bone Density status: never done  Lung Cancer Screening: (Low Dose CT Chest recommended if Age 62-80years, 30 pack-year currently smoking OR have quit w/in 15years.) does not qualify.   Lung Cancer Screening Referral: no  Additional Screening:  Hepatitis C Screening: does qualify; Completed 08/21/2016  Vision Screening: Recommended annual ophthalmology  exams for early detection of glaucoma and other disorders of the eye. Is the patient up to date with their annual eye exam?  Yes  Who is the provider or what is the name of the office in which the patient attends annual eye exams? KMonna Fam MD. If pt is not established with a provider, would they like to be referred to a provider to establish care? No .   Dental Screening: Recommended annual dental exams for proper oral hygiene  Community Resource Referral / Chronic Care Management:  CRR required this visit?  No   CCM required this visit?  No      Plan:     I have personally reviewed and noted the following in the patient's chart:   Medical and social history Use of alcohol, tobacco or illicit drugs  Current medications and supplements including opioid prescriptions.  Functional ability and status Nutritional status Physical activity Advanced directives List of other physicians Hospitalizations, surgeries, and ER visits in previous 12 months Vitals Screenings to include cognitive, depression, and falls Referrals and appointments  In addition, I have reviewed and discussed with patient certain preventive protocols, quality metrics, and best practice recommendations. A written personalized care plan for preventive services as well as general preventive health recommendations were provided to patient.     Sheral Flow, LPN   [id number]   Nurse Notes:  There were no vitals filed for this visit. There is no height or weight on file to calculate BMI. Patient stated that she has no issues with gait or balance; does not use any assistive devices. Medications reviewed with patient; no opioid use noted.

## 2021-08-22 NOTE — Patient Instructions (Signed)
Lynn Barnes , Thank you for taking time to come for your Medicare Wellness Visit. I appreciate your ongoing commitment to your health goals. Please review the following plan we discussed and let me know if I can assist you in the future.   Screening recommendations/referrals: Colonoscopy: Patient declined Mammogram: 11/24/2020; due every 1-2 years Bone Density: Never done Recommended yearly ophthalmology/optometry visit for glaucoma screening and checkup Recommended yearly dental visit for hygiene and checkup  Vaccinations: Influenza vaccine: 10/16/2020 Pneumococcal vaccine: 08/21/2016, 08/24/2017 Tdap vaccine: Overdue Shingles vaccine: Never done   Covid-19: 05/09/2019, 06/11/2019, 10/1/221, 08/31/2020  Advanced directives: Yes; Please bring a copy of your health care power of attorney and living will to the office at your convenience.  Conditions/risks identified: Yes  Next appointment: Please schedule your next Medicare Wellness Visit with your Nurse Health Advisor in 1 year by calling [phone].   Preventive Care 58 Years and Older, Female Preventive care refers to lifestyle choices and visits with your health care provider that can promote health and wellness. What does preventive care include? A yearly physical exam. This is also called an annual well check. Dental exams once or twice a year. Routine eye exams. Ask your health care provider how often you should have your eyes checked. Personal lifestyle choices, including: Daily care of your teeth and gums. Regular physical activity. Eating a healthy diet. Avoiding tobacco and drug use. Limiting alcohol use. Practicing safe sex. Taking low-dose aspirin every day. Taking vitamin and mineral supplements as recommended by your health care provider. What happens during an annual well check? The services and screenings done by your health care provider during your annual well check will depend on your age, overall health, lifestyle  risk factors, and family history of disease. Counseling  Your health care provider may ask you questions about your: Alcohol use. Tobacco use. Drug use. Emotional well-being. Home and relationship well-being. Sexual activity. Eating habits. History of falls. Memory and ability to understand (cognition). Work and work Statistician. Reproductive health. Screening  You may have the following tests or measurements: Height, weight, and BMI. Blood pressure. Lipid and cholesterol levels. These may be checked every 5 years, or more frequently if you are over 1 years old. Skin check. Lung cancer screening. You may have this screening every year starting at age 36 if you have a 30-pack-year history of smoking and currently smoke or have quit within the past 15 years. Fecal occult blood test (FOBT) of the stool. You may have this test every year starting at age 36. Flexible sigmoidoscopy or colonoscopy. You may have a sigmoidoscopy every 5 years or a colonoscopy every 10 years starting at age 51. Hepatitis C blood test. Hepatitis B blood test. Sexually transmitted disease (STD) testing. Diabetes screening. This is done by checking your blood sugar (glucose) after you have not eaten for a while (fasting). You may have this done every 1-3 years. Bone density scan. This is done to screen for osteoporosis. You may have this done starting at age 2. Mammogram. This may be done every 1-2 years. Talk to your health care provider about how often you should have regular mammograms. Talk with your health care provider about your test results, treatment options, and if necessary, the need for more tests. Vaccines  Your health care provider may recommend certain vaccines, such as: Influenza vaccine. This is recommended every year. Tetanus, diphtheria, and acellular pertussis (Tdap, Td) vaccine. You may need a Td booster every 10 years. Zoster vaccine. You may need  this after age 31. Pneumococcal  13-valent conjugate (PCV13) vaccine. One dose is recommended after age 54. Pneumococcal polysaccharide (PPSV23) vaccine. One dose is recommended after age 84. Talk to your health care provider about which screenings and vaccines you need and how often you need them. This information is not intended to replace advice given to you by your health care provider. Make sure you discuss any questions you have with your health care provider. Document Released: 02/23/2015 Document Revised: 10/17/2015 Document Reviewed: 11/28/2014 Elsevier Interactive Patient Education  2017 [place] Prevention in the Home Falls can cause injuries. They can happen to people of all ages. There are many things you can do to make your home safe and to help prevent falls. What can I do on the outside of my home? Regularly fix the edges of walkways and driveways and fix any cracks. Remove anything that might make you trip as you walk through a door, such as a raised step or threshold. Trim any bushes or trees on the path to your home. Use bright outdoor lighting. Clear any walking paths of anything that might make someone trip, such as rocks or tools. Regularly check to see if handrails are loose or broken. Make sure that both sides of any steps have handrails. Any raised decks and porches should have guardrails on the edges. Have any leaves, snow, or ice cleared regularly. Use sand or salt on walking paths during winter. Clean up any spills in your garage right away. This includes oil or grease spills. What can I do in the bathroom? Use night lights. Install grab bars by the toilet and in the tub and shower. Do not use towel bars as grab bars. Use non-skid mats or decals in the tub or shower. If you need to sit down in the shower, use a plastic, non-slip stool. Keep the floor dry. Clean up any water that spills on the floor as soon as it happens. Remove soap buildup in the tub or shower regularly. Attach  bath mats securely with double-sided non-slip rug tape. Do not have throw rugs and other things on the floor that can make you trip. What can I do in the bedroom? Use night lights. Make sure that you have a light by your bed that is easy to reach. Do not use any sheets or blankets that are too big for your bed. They should not hang down onto the floor. Have a firm chair that has side arms. You can use this for support while you get dressed. Do not have throw rugs and other things on the floor that can make you trip. What can I do in the kitchen? Clean up any spills right away. Avoid walking on wet floors. Keep items that you use a lot in easy-to-reach places. If you need to reach something above you, use a strong step stool that has a grab bar. Keep electrical cords out of the way. Do not use floor polish or wax that makes floors slippery. If you must use wax, use non-skid floor wax. Do not have throw rugs and other things on the floor that can make you trip. What can I do with my stairs? Do not leave any items on the stairs. Make sure that there are handrails on both sides of the stairs and use them. Fix handrails that are broken or loose. Make sure that handrails are as long as the stairways. Check any carpeting to make sure that it is firmly attached to  the stairs. Fix any carpet that is loose or worn. Avoid having throw rugs at the top or bottom of the stairs. If you do have throw rugs, attach them to the floor with carpet tape. Make sure that you have a light switch at the top of the stairs and the bottom of the stairs. If you do not have them, ask someone to add them for you. What else can I do to help prevent falls? Wear shoes that: Do not have high heels. Have rubber bottoms. Are comfortable and fit you well. Are closed at the toe. Do not wear sandals. If you use a stepladder: Make sure that it is fully opened. Do not climb a closed stepladder. Make sure that both sides of the  stepladder are locked into place. Ask someone to hold it for you, if possible. Clearly mark and make sure that you can see: Any grab bars or handrails. First and last steps. Where the edge of each step is. Use tools that help you move around (mobility aids) if they are needed. These include: Canes. Walkers. Scooters. Crutches. Turn on the lights when you go into a dark area. Replace any light bulbs as soon as they burn out. Set up your furniture so you have a clear path. Avoid moving your furniture around. If any of your floors are uneven, fix them. If there are any pets around you, be aware of where they are. Review your medicines with your doctor. Some medicines can make you feel dizzy. This can increase your chance of falling. Ask your doctor what other things that you can do to help prevent falls. This information is not intended to replace advice given to you by your health care provider. Make sure you discuss any questions you have with your health care provider. Document Released: 11/23/2008 Document Revised: 07/05/2015 Document Reviewed: 03/03/2014 Elsevier Interactive Patient Education  2017 [employer].

## 2021-08-29 ENCOUNTER — Telehealth: Payer: Self-pay | Admitting: Internal Medicine

## 2021-08-29 MED ORDER — TRAMADOL HCL 50 MG PO TABS
ORAL_TABLET | ORAL | 2 refills | Status: DC
Start: 2021-08-29 — End: 2021-11-06

## 2021-08-29 NOTE — Telephone Encounter (Signed)
Caller & Relationship to patient: Lynn Barnes  Call back number: [phone]  Date of last office visit: 10/16/20  Date of next office visit: 10/18/21  Medication(s) to be refilled: traMADol (ULTRAM) 50 MG tablet   Preferred Pharmacy:  [id number] [address], [address] AT [facility] [address] Phone:  [phone]  Fax:  [phone]

## 2021-09-02 DIAGNOSIS — E1040 Type 1 diabetes mellitus with diabetic neuropathy, unspecified: Secondary | ICD-10-CM | POA: Diagnosis not present

## 2021-09-02 DIAGNOSIS — E10649 Type 1 diabetes mellitus with hypoglycemia without coma: Secondary | ICD-10-CM | POA: Diagnosis not present

## 2021-09-02 DIAGNOSIS — E042 Nontoxic multinodular goiter: Secondary | ICD-10-CM | POA: Diagnosis not present

## 2021-09-02 DIAGNOSIS — E1065 Type 1 diabetes mellitus with hyperglycemia: Secondary | ICD-10-CM | POA: Diagnosis not present

## 2021-09-02 DIAGNOSIS — E041 Nontoxic single thyroid nodule: Secondary | ICD-10-CM | POA: Diagnosis not present

## 2021-09-02 DIAGNOSIS — E103299 Type 1 diabetes mellitus with mild nonproliferative diabetic retinopathy without macular edema, unspecified eye: Secondary | ICD-10-CM | POA: Diagnosis not present

## 2021-09-03 ENCOUNTER — Telehealth: Payer: Self-pay | Admitting: Internal Medicine

## 2021-09-03 DIAGNOSIS — F3189 Other bipolar disorder: Secondary | ICD-10-CM | POA: Diagnosis not present

## 2021-09-03 MED ORDER — SCOPOLAMINE 1 MG/3DAYS TD PT72
1.0000 | MEDICATED_PATCH | TRANSDERMAL | 12 refills | Status: AC
Start: 2021-09-03 — End: ?

## 2021-09-03 NOTE — Telephone Encounter (Signed)
Done erx 

## 2021-09-03 NOTE — Telephone Encounter (Signed)
Notified pt rx sent to [facility]  

## 2021-09-03 NOTE — Telephone Encounter (Signed)
Patient is going on a cruise on 09/20/2021 - she is requesting motion sickness patches be sent in to [facility] on [address] in [place].  Patient has visit scheduled for 10/18/2021.

## 2021-10-15 DIAGNOSIS — C44722 Squamous cell carcinoma of skin of right lower limb, including hip: Secondary | ICD-10-CM | POA: Diagnosis not present

## 2021-10-18 ENCOUNTER — Encounter: Payer: Medicare PPO | Admitting: Internal Medicine

## 2021-10-22 DIAGNOSIS — E1065 Type 1 diabetes mellitus with hyperglycemia: Secondary | ICD-10-CM | POA: Diagnosis not present

## 2021-10-30 ENCOUNTER — Encounter: Payer: Medicare PPO | Admitting: Internal Medicine

## 2021-11-05 ENCOUNTER — Emergency Department (HOSPITAL_BASED_OUTPATIENT_CLINIC_OR_DEPARTMENT_OTHER): Payer: Medicare PPO

## 2021-11-05 ENCOUNTER — Other Ambulatory Visit: Payer: Self-pay

## 2021-11-05 ENCOUNTER — Encounter (HOSPITAL_BASED_OUTPATIENT_CLINIC_OR_DEPARTMENT_OTHER): Payer: Self-pay | Admitting: Emergency Medicine

## 2021-11-05 ENCOUNTER — Emergency Department (HOSPITAL_BASED_OUTPATIENT_CLINIC_OR_DEPARTMENT_OTHER)
Admission: EM | Admit: 2021-11-05 | Discharge: 2021-11-05 | Disposition: A | Discharge status: Home / Self Care | Payer: Medicare PPO | Attending: Emergency Medicine | Admitting: Emergency Medicine

## 2021-11-05 ENCOUNTER — Telehealth: Payer: Self-pay | Admitting: Neurology

## 2021-11-05 DIAGNOSIS — E1143 Type 2 diabetes mellitus with diabetic autonomic (poly)neuropathy: Secondary | ICD-10-CM | POA: Insufficient documentation

## 2021-11-05 DIAGNOSIS — G20 Parkinson's disease: Secondary | ICD-10-CM | POA: Insufficient documentation

## 2021-11-05 DIAGNOSIS — I6523 Occlusion and stenosis of bilateral carotid arteries: Secondary | ICD-10-CM | POA: Diagnosis not present

## 2021-11-05 DIAGNOSIS — R4182 Altered mental status, unspecified: Secondary | ICD-10-CM

## 2021-11-05 DIAGNOSIS — R479 Unspecified speech disturbances: Secondary | ICD-10-CM | POA: Diagnosis not present

## 2021-11-05 DIAGNOSIS — E1165 Type 2 diabetes mellitus with hyperglycemia: Secondary | ICD-10-CM | POA: Diagnosis not present

## 2021-11-05 DIAGNOSIS — R739 Hyperglycemia, unspecified: Secondary | ICD-10-CM

## 2021-11-05 DIAGNOSIS — Z794 Long term (current) use of insulin: Secondary | ICD-10-CM | POA: Insufficient documentation

## 2021-11-05 LAB — URINALYSIS, ROUTINE W REFLEX MICROSCOPIC
Bilirubin Urine: NEGATIVE
Glucose, UA: >=500 mg/dL — AB
Hgb urine dipstick: NEGATIVE
Ketones, ur: >=80 mg/dL — AB
Leukocytes,Ua: NEGATIVE
Nitrite: NEGATIVE
Protein, ur: NEGATIVE mg/dL
Specific Gravity, Urine: 1.015 (ref 1.005–1.030)
pH: 5.5 (ref 5.0–8.0)

## 2021-11-05 LAB — COMPREHENSIVE METABOLIC PANEL
ALT: 28 U/L (ref 0–44)
AST: 27 U/L (ref 15–41)
Albumin: 3.9 g/dL (ref 3.5–5.0)
Alkaline Phosphatase: 83 U/L (ref 38–126)
Anion gap: 10 (ref 5–15)
BUN: 24 mg/dL — ABNORMAL HIGH (ref 8–23)
CO2: 23 mmol/L (ref 22–32)
Calcium: 9.2 mg/dL (ref 8.9–10.3)
Chloride: 103 mmol/L (ref 98–111)
Creatinine, Ser: 0.87 mg/dL (ref 0.44–1.00)
GFR, Estimated: >60 mL/min (ref >60)
Glucose, Bld: 371 mg/dL — ABNORMAL HIGH (ref 70–99)
Potassium: 4.1 mmol/L (ref 3.5–5.1)
Sodium: 136 mmol/L (ref 135–145)
Total Bilirubin: 1 mg/dL (ref 0.3–1.2)
Total Protein: 7.1 g/dL (ref 6.5–8.1)

## 2021-11-05 LAB — CBC
HCT: 42 % (ref 36.0–46.0)
Hemoglobin: 14.9 g/dL (ref 12.0–15.0)
MCH: 32.4 pg (ref 26.0–34.0)
MCHC: 35.5 g/dL (ref 30.0–36.0)
MCV: 91.3 fL (ref 80.0–100.0)
Platelets: 297 10*3/uL (ref 150–400)
RBC: 4.6 MIL/uL (ref 3.87–5.11)
RDW: 11.3 % — ABNORMAL LOW (ref 11.5–15.5)
WBC: 7.7 10*3/uL (ref 4.0–10.5)
nRBC: 0 % (ref 0.0–0.2)

## 2021-11-05 LAB — URINALYSIS, MICROSCOPIC (REFLEX)

## 2021-11-05 LAB — CBG MONITORING, ED
Glucose-Capillary: 380 mg/dL — ABNORMAL HIGH (ref 70–99)
Glucose-Capillary: 324 mg/dL — ABNORMAL HIGH (ref 70–99)

## 2021-11-05 LAB — TSH: TSH: 0.737 u[IU]/mL (ref 0.350–4.500)

## 2021-11-05 MED ORDER — SODIUM CHLORIDE 0.9 % IV BOLUS
1000.0000 mL | Freq: Once | INTRAVENOUS | Status: AC
  Administered 2021-11-05: 1000 mL via INTRAVENOUS

## 2021-11-05 NOTE — Telephone Encounter (Signed)
Pt is calling. Stated that Dr. Brett Fairy referred her to a doctor office in [place]. Stated she was not a candide for the surgery. Said the doctor wrote her a prescription for prihexalphinidyl and she wants to know if they Dr. Brett Fairy can write this prescription, so she want have to drive to [place].

## 2021-11-05 NOTE — ED Notes (Signed)
Pt requested to take her home medication of her long-acting insulin. Per EDMD it is ok for pt to take while in the ER. Pt wife at bedside and administered 25 units of Tresiba to pt.

## 2021-11-05 NOTE — Telephone Encounter (Signed)
Pt was called back, she declined the 1:00 for tomorrow, she has another appointment that exact time. This is FYI for [employer], Therapist, sports

## 2021-11-05 NOTE — ED Provider Notes (Signed)
[facility] EMERGENCY DEPARTMENT Provider Note   CSN: [id number] Arrival date & time: 11/05/21  5176     History  Chief Complaint  Patient presents with   Altered Mental Status    Lynn Barnes is a 61 y.o. female.   Altered Mental Status Patient presents with mental status change.  Difficulty getting words out.  Some mild confusion with it.  Began yesterday.  Reportedly has had the words right in her head but cannot get them out.  Occasional have some trouble with occasional words but this is more severe.  No fevers.  No headache.  No trauma.  No numbness weakness.  Has had a tremor for a while now.  Had been on Artane for tremors but stopped that a couple weeks ago.    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[place])    Bipolar disorder ([place]) 02/15/2011   Cervical spondylosis without myelopathy    Depression    Depression    Diabetes mellitus    sees Dr Altheimer/endo   Dysautonomia ([place]) 10/24/2011   Dyskinesia     subacute,due to drugs   Dyslipidemia    History of nuclear stress test    Myoview 11/16: EF 81%, no ischemia; Low Risk   Hyperlipidemia    IBS (irritable bowel syndrome) 02/15/2011   Insomnia 02/21/2011   Multinodular thyroid    with large dominant solid nodule with calcifications in the lower left pole benign by FNA in 1/12 and stable by repeat ulltrasound in 4/13   Neuropathy    with paresthesias in feet   Orthostatic hypotension    Orthostatic hypotension    Parkinsonism (HCC)    due to prolonged prozac/risperdl for yrs/Dr Dohmeir   Parkinsonism (HCC)    POTS (postural orthostatic tachycardia syndrome)    Radiculopathy    cervical   Resting tremor    Rotator cuff syndrome of left shoulder    S/P partial hysterectomy    Spondylosis without myelopathy    Subacute dyskinesia due to  drugs(333.85)    dr dohmeier/neuro   Tachycardia    syndrome   Therapy    on therapy by psychiatrist using sebutex    Thyroid nodule    s/p biopsy - benign approx jan 2012   Tremor     Home Medications Prior to Admission medications   Medication Sig Start Date End Date Taking? Authorizing Provider  atorvastatin (LIPITOR) 40 MG tablet TAKE 1 TABLET(40 MG) BY MOUTH DAILY 12/14/20   Biagio Borg, MD  carbidopa-levodopa (SINEMET CR) 50-200 MG tablet Take 1 tablet by mouth 2 (two) times daily. 08/20/20   Dohmeier, Asencion Partridge, MD  fludrocortisone (FLORINEF) 0.1 MG tablet TAKE 1/2 TABLET BY MOUTH TWICE DAILY 01/31/21   Fay Records, MD  gabapentin (NEURONTIN) 800 MG tablet Take 1 tablet (800 mg total) by mouth 3 (three) times daily. 05/29/21   Gregor Hams, MD  Glucagon 3 MG/DOSE POWD Place into the nose. 01/28/18   [provider]  glucose blood test strip 1 each by Other route. 7 times per day   - as instructed    [provider]  HUMALOG KWIKPEN 100 UNIT/ML KwikPen INJECT BEFORE EACH MEAL WITH 1 UNIT PER 10 GM CARBS PLUS CORRECTIN OF 1 UNIT PER  50 MG 02/25/18   [provider]  insulin degludec (TRESIBA) 100 UNIT/ML FlexTouch Pen Inject 27 Units into the skin daily.  01/28/18   [provider]  LATUDA 120 MG TABS Take 1 tablet by mouth at bedtime. 03/24/19   [provider]  pindolol (VISKEN) 5 MG tablet TAKE 1/2 TABLET(2.5 MG) BY MOUTH TWICE DAILY 12/28/20   Fay Records, MD  predniSONE (DELTASONE) 10 MG tablet Take 3 tablets (30 mg total) by mouth daily with breakfast. 05/29/21   Gregor Hams, MD  risperidone (RISPERDAL) 4 MG tablet Take 8 mg by mouth at bedtime. 01/15/15   [provider]  scopolamine (TRANSDERM SCOP, 1.5 MG,) 1 MG/3DAYS Place 1 patch (1.5 mg total) onto the skin every 3 (three) days. 09/03/21   Biagio Borg, MD  traMADol (ULTRAM) 50 MG tablet TAKE 1 TABLET(50 MG) BY MOUTH EVERY 6 HOURS AS NEEDED FOR MODERATE PAIN 08/29/21    Biagio Borg, MD  zolpidem (AMBIEN) 10 MG tablet Take 10 mg by mouth at bedtime. 04/06/15   [provider]      Allergies    Artane [trihexyphenidyl], Klonopin [clonazepam], Nsaids, and Sulfonamide derivatives    Review of Systems   Review of Systems  Physical Exam Updated Vital Signs BP (!) 168/86 (BP Location: Left Arm)   Pulse (!) 106   Temp 98.4 F (36.9 C) (Oral)   Resp 18   Ht '5\' 7"'$  (1.702 m)   Wt 72.6 kg   SpO2 99%   BMI 25.06 kg/m  Physical Exam Vitals and nursing note reviewed.  Eyes:     Pupils: Pupils are equal, round, and reactive to light.  Cardiovascular:     Rate and Rhythm: Tachycardia present.  Pulmonary:     Breath sounds: No wheezing or rhonchi.  Abdominal:     Tenderness: There is no abdominal tenderness.  Musculoskeletal:        General: No tenderness.     Cervical back: Neck supple.  Skin:    Capillary Refill: Capillary refill takes less than 2 seconds.     Comments: Has a tremor in left hand.  Awake and appropriate.  Does have some mild difficulty expressing herself.  Pauses before answering some of the questions.  Reportedly this is not her baseline.  Moving all extremities.  Finger-nose intact bilaterally.  Normal speech, except for the positives and difficulty finding words.  Neurological:     Mental Status: She is alert.     ED Results / Procedures / Treatments   Labs (all labs ordered are listed, but only abnormal results are displayed) Labs Reviewed  COMPREHENSIVE METABOLIC PANEL - Abnormal; Notable for the following components:      Result Value   Glucose, Bld 371 (*)    BUN 24 (*)    All other components within normal limits  CBC - Abnormal; Notable for the following components:   RDW 11.3 (*)    All other components within normal limits  URINALYSIS, ROUTINE W REFLEX MICROSCOPIC - Abnormal; Notable for the following components:   Glucose, UA >=500 (*)    Ketones, ur >=80 (*)    All other components within normal limits   URINALYSIS, MICROSCOPIC (REFLEX) - Abnormal; Notable for the following components:   Bacteria, UA RARE (*)    All other components within normal limits  CBG MONITORING, ED - Abnormal; Notable for the following components:   Glucose-Capillary 380 (*)    All other components within normal  limits  CBG MONITORING, ED - Abnormal; Notable for the following components:   Glucose-Capillary 324 (*)    All other components within normal limits  TSH    EKG None  Radiology CT Head Wo Contrast  Result Date: 11/05/2021 CLINICAL DATA:  Difficulty speaking incompletely tasks since last night. EXAM: CT HEAD WITHOUT CONTRAST TECHNIQUE: Contiguous axial images were obtained from the base of the skull through the vertex without intravenous contrast. RADIATION DOSE REDUCTION: This exam was performed according to the departmental dose-optimization program which includes automated exposure control, adjustment of the mA and/or kV according to patient size and/or use of iterative reconstruction technique. COMPARISON:  CT head 06/10/2019 FINDINGS: Brain: There is no acute intracranial hemorrhage, extra-axial fluid collection, or acute infarct. Parenchymal volume is normal. The ventricles are normal in size. Gray-white differentiation is preserved. There is no mass lesion.  There is no mass effect or midline shift. Vascular: There is calcification of the bilateral carotid siphons. Skull: Normal. Negative for fracture or focal lesion. Sinuses/Orbits: There is complete opacification of the left sphenoid sinus. Globes and orbits are unremarkable. Other: None. IMPRESSION: 1. No acute intracranial pathology. 2. Complete opacification of the left sphenoid sinus. Electronically Signed   By: Valetta Mole M.D.   On: 11/05/2021 08:18    Procedures Procedures    Medications Ordered in ED Medications  sodium chloride 0.9 % bolus 1,000 mL (0 mLs Intravenous Stopped 11/05/21 1002)  sodium chloride 0.9 % bolus 1,000 mL (0 mLs  Intravenous Stopped 11/05/21 1108)    ED Course/ Medical Decision Making/ A&P                           Medical Decision Making Amount and/or Complexity of Data Reviewed Labs: ordered. Radiology: ordered.  Patient presents with some mental status change.  Some confusion and difficulty speaking.  Began sounds like yesterday but has had episodes with word finding in the past but not this severe.  Nonfocal exam otherwise.  Will get head CT and some basic blood work.  Has a history of hypothyroidism will check back.  Has had some Parkinson's symptoms in the past likely related to medications per notes.  Around 2 weeks ago had stopped her Artane.  Lab work reviewed.  Does have mild hyperglycemia.  Fluids given.  Has had some improvement.  Also improvement in her tachycardia.  However patient does have ketones in the urine.  However and has no anion gap and normal bicarb on the lab work.  Improving with some fluids.  Head CT done and reassuring.  Potentially could be a metabolic encephalopathy due to the hyperglycemia particular status improved.  Has had work-up for mental status changes including some speech issues in the past.  Appears stable for discharge home however with the speech issues having is reasonable to follow-up with neurology who she is seen before.  Follow-up instructions given and if more difficulty controlling sugars will return.  Will discharge home.  DKA felt less likely.        Final Clinical Impression(s) / ED Diagnoses Final diagnoses:  Altered mental status, unspecified altered mental status type  Hyperglycemia    Rx / DC Orders ED Discharge Orders     None         Davonna Belling, MD 11/05/21 1242

## 2021-11-05 NOTE — Inpatient Diabetes Management (Signed)
Inpatient Diabetes Program Recommendations  AACE/ADA: New Consensus Statement on Inpatient Glycemic Control (2015)  Target Ranges:  Prepandial:   less than 140 mg/dL      Peak postprandial:   less than 180 mg/dL (1-2 hours)      Critically ill patients:  140 - 180 mg/dL   Lab Results  Component Value Date   GLUCAP 380 (H) 11/05/2021   HGBA1C 8.0 (H) 10/16/2020    Review of Glycemic Control  Latest Reference Range & Units 11/05/21 07:12  Glucose 70 - 99 mg/dL 371 (H)   Diabetes history: DM type 1 Outpatient Diabetes medications: Tresiba 27 units Daily, Humalog 1 unit for every 10 grams of carbs 1 unit for every 50 point above target glucose Current orders for Inpatient glycemic control:  None being evaluated in [facility] ED  Inpatient Diabetes Program Recommendations:    -   Start Novolog 0-9 units Q4 hours while NPO (at least in ED) -   Semglee 25 units Q24 hours  Thanks,  Tama Headings RN, MSN, BC-ADM Inpatient Diabetes Coordinator Team Pager [phone] (8a-5p)

## 2021-11-05 NOTE — Discharge Instructions (Addendum)
Try and keep yourself hydrated.  Watch for worsening symptoms.  Follow-up with your doctors as needed.

## 2021-11-05 NOTE — ED Triage Notes (Signed)
Per pt family pt having cognitive issues difficulty getting words out, difficulty completing task.

## 2021-11-06 ENCOUNTER — Ambulatory Visit (INDEPENDENT_AMBULATORY_CARE_PROVIDER_SITE_OTHER): Payer: Medicare PPO | Admitting: Internal Medicine

## 2021-11-06 ENCOUNTER — Encounter: Payer: Self-pay | Admitting: Neurology

## 2021-11-06 VITALS — BP 160/80 | HR 88 | Temp 99.0°F | Wt 162.0 lb

## 2021-11-06 DIAGNOSIS — G2119 Other drug induced secondary parkinsonism: Secondary | ICD-10-CM

## 2021-11-06 DIAGNOSIS — Z0001 Encounter for general adult medical examination with abnormal findings: Secondary | ICD-10-CM

## 2021-11-06 DIAGNOSIS — E1042 Type 1 diabetes mellitus with diabetic polyneuropathy: Secondary | ICD-10-CM

## 2021-11-06 DIAGNOSIS — Z86010 Personal history of colonic polyps: Secondary | ICD-10-CM

## 2021-11-06 DIAGNOSIS — Z23 Encounter for immunization: Secondary | ICD-10-CM | POA: Diagnosis not present

## 2021-11-06 DIAGNOSIS — R030 Elevated blood-pressure reading, without diagnosis of hypertension: Secondary | ICD-10-CM

## 2021-11-06 DIAGNOSIS — Z20828 Contact with and (suspected) exposure to other viral communicable diseases: Secondary | ICD-10-CM

## 2021-11-06 DIAGNOSIS — E7800 Pure hypercholesterolemia, unspecified: Secondary | ICD-10-CM | POA: Diagnosis not present

## 2021-11-06 MED ORDER — TRIHEXYPHENIDYL HCL 2 MG PO TABS: 2.0000 mg | ORAL_TABLET | Freq: Three times a day (TID) | ORAL | 11 refills | Status: AC

## 2021-11-06 MED ORDER — TRAMADOL HCL 50 MG PO TABS: ORAL_TABLET | ORAL | 2 refills | Status: DC

## 2021-11-06 NOTE — Progress Notes (Signed)
Patient ID: Lynn Barnes, female   DOB: [birth year], 61 y.o.   MRN: [id number]         Chief Complaint:: wellness exam and Follow-up (Pt stated that she was in the hospital yesterday, and was spilling ketones. And also slurring words. She also stated that she was very dehydrated. Sugar was also high (380).  )  , htn, hld, dm       HPI:  Lynn Barnes is a 61 y.o. female here for wellness exam; plans to call for pap with GYN soon, due for colonoscopy, and flu shot today; declines covid booster and shingrix and tdap, ow up to date  Had multiple labs at ED yesterday, declines further labs .                Also out of artane, asks for restart, plans to f/u with neurology Nov 1, as well as endo soon at [facility].  Needs tramadol refill, working well for neuropathic pain.  Was seen in ED yesterday with AMS but since resolved.  CT head neg, labs without over concerning.  Pt denies chest pain, increased sob or doe, wheezing, orthopnea, PND, increased LE swelling, palpitations, dizziness or syncope.  Needs tramadol refil for chronic arthritis pain.   Wt Readings from Last 3 Encounters:  11/06/21 162 lb (73.5 kg)  11/05/21 160 lb (72.6 kg)  05/29/21 167 lb (75.8 kg)   BP Readings from Last 3 Encounters:  11/06/21 (!) 160/80  11/05/21 (!) 168/86  05/29/21 128/84   Immunization History  Administered Date(s) Administered   Influenza Inj Mdck Quad Pf 10/29/2018, 10/29/2018   Influenza Whole 11/11/2007, 04/24/2008   Influenza, Seasonal, Injecte, Preservative Fre 11/10/2012   Influenza,inj,Quad PF,6+ Mos 09/27/2013, 10/11/2019, 10/16/2020, 11/06/2021   Influenza-Unspecified 11/13/2017, 10/29/2018   Moderna Sars-Covid-2 Vaccination 05/09/2019, 06/11/2019, 11/11/2019, 08/31/2020   Pneumococcal Conjugate-13 08/21/2016   Pneumococcal Polysaccharide-23 08/24/2017   Health Maintenance Due  Topic Date Due   PAP SMEAR-Modifier  09/05/2008   COLONOSCOPY (Pts 45-13yr Insurance coverage will need to be  confirmed)  05/14/2016   HEMOGLOBIN A1C  04/15/2021      Past Medical History:  Diagnosis Date   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 ([place]    opium   Addiction, opium ([facility]    Allergy    spring   Anxiety    ADHD CHRONIC ANXIETY   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 ([facility]    Bipolar disorder ([place] 02/15/2011   Cervical spondylosis without myelopathy    Depression    Depression    Diabetes mellitus    sees Dr Altheimer/endo   Dysautonomia ([place] 10/24/2011   Dyskinesia     subacute,due to drugs   Dyslipidemia    History of nuclear stress test    Myoview 11/16: EF 81%, no ischemia; Low Risk   Hyperlipidemia    IBS (irritable bowel syndrome) 02/15/2011   Insomnia 02/21/2011   Multinodular thyroid    with large dominant solid nodule with calcifications in the lower left pole benign by FNA in 1/12 and stable by repeat ulltrasound in 4/13   Neuropathy    with paresthesias in feet   Orthostatic hypotension    Orthostatic hypotension    Parkinsonism (HCC)    due to prolonged prozac/risperdl for yrs/Dr Dohmeir   Parkinsonism (HCC)    POTS (postural orthostatic tachycardia syndrome)    Radiculopathy    cervical   Resting tremor    Rotator cuff syndrome of left shoulder    S/P partial hysterectomy  Spondylosis without myelopathy    Subacute dyskinesia due to drugs(333.85)    dr dohmeier/neuro   Tachycardia    syndrome   Therapy    on therapy by psychiatrist using sebutex    Thyroid nodule    s/p biopsy - benign approx jan 2012   Tremor    Past Surgical History:  Procedure Laterality Date   ACROMIOPLASTY Left 2002   Dr Gladstone Lighter   BREAST EXCISIONAL BIOPSY Right    benign   BREAST LUMPECTOMY Right    herniated disc cervical spine     lower back  1992   OOPHORECTOMY     PARTIAL HYSTERECTOMY     SHOULDER SURGERY Left    SPINE SURGERY  feb 2012   c5-6 fusion, Dr Rolena Infante   THYROID LOBECTOMY  11/11   nodule biopsy, benign    reports that she has never smoked. She has never used smokeless  tobacco. She reports that she does not drink alcohol and does not use drugs. family history includes ALS in her paternal grandmother; Heart disease in her father and paternal grandmother; Hypertension in her father and sister; Lung cancer in her mother; Pneumonia in her father. Allergies  Allergen Reactions   Klonopin [Clonazepam] Other (See Comments)    Severe weakness   Nsaids Other (See Comments)    Irritates stomach   Sulfonamide Derivatives Rash   Current Outpatient Medications on File Prior to Visit  Medication Sig Dispense Refill   atorvastatin (LIPITOR) 40 MG tablet TAKE 1 TABLET(40 MG) BY MOUTH DAILY 90 tablet 3   carbidopa-levodopa (SINEMET CR) 50-200 MG tablet Take 1 tablet by mouth 2 (two) times daily. 270 tablet 3   fludrocortisone (FLORINEF) 0.1 MG tablet TAKE 1/2 TABLET BY MOUTH TWICE DAILY 90 tablet 3   gabapentin (NEURONTIN) 800 MG tablet Take 1 tablet (800 mg total) by mouth 3 (three) times daily. 270 tablet 1   Glucagon 3 MG/DOSE POWD Place into the nose.     glucose blood test strip 1 each by Other route. 7 times per day   - as instructed     HUMALOG KWIKPEN 100 UNIT/ML KwikPen INJECT BEFORE EACH MEAL WITH 1 UNIT PER 10 GM CARBS PLUS CORRECTIN OF 1 UNIT PER 50 MG     insulin degludec (TRESIBA) 100 UNIT/ML FlexTouch Pen Inject 27 Units into the skin daily.      LATUDA 120 MG TABS Take 1 tablet by mouth at bedtime.     pindolol (VISKEN) 5 MG tablet TAKE 1/2 TABLET(2.5 MG) BY MOUTH TWICE DAILY 90 tablet 3   predniSONE (DELTASONE) 10 MG tablet Take 3 tablets (30 mg total) by mouth daily with breakfast. 15 tablet 0   risperidone (RISPERDAL) 4 MG tablet Take 8 mg by mouth at bedtime.  0   scopolamine (TRANSDERM SCOP, 1.5 MG,) 1 MG/3DAYS Place 1 patch (1.5 mg total) onto the skin every 3 (three) days. 10 patch 12   zolpidem (AMBIEN) 10 MG tablet Take 10 mg by mouth at bedtime.  1   No current facility-administered medications on file prior to visit.        ROS:  All others  reviewed and negative.  Objective        PE:  BP (!) 160/80 (BP Location: Left Arm, Patient Position: Sitting, Cuff Size: Normal)   Pulse 88   Temp 99 F (37.2 C) (Oral)   Wt 162 lb (73.5 kg)   SpO2 97%   BMI 25.37 kg/m  Constitutional: Pt appears in NAD               HENT: Head: NCAT.                Right Ear: External ear normal.                 Left Ear: External ear normal.                Eyes: . Pupils are equal, round, and reactive to light. Conjunctivae and EOM are normal               Nose: without d/c or deformity               Neck: Neck supple. Gross normal ROM               Cardiovascular: Normal rate and regular rhythm.                 Pulmonary/Chest: Effort normal and breath sounds without rales or wheezing.                Abd:  Soft, NT, ND, + BS, no organomegaly               Neurological: Pt is alert. At baseline orientation, motor grossly intact               Skin: Skin is warm. No rashes, no other new lesions, LE edema - none               Psychiatric: Pt behavior is normal without agitation   Micro: none  Cardiac tracings I have personally interpreted today:  none  Pertinent Radiological findings (summarize): none   Lab Results  Component Value Date   WBC 7.7 11/05/2021   HGB 14.9 11/05/2021   HCT 42.0 11/05/2021   PLT 297 11/05/2021   GLUCOSE 371 (H) 11/05/2021   CHOL 162 10/16/2020   TRIG 71.0 10/16/2020   HDL 63.80 10/16/2020   LDLCALC 84 10/16/2020   ALT 28 11/05/2021   AST 27 11/05/2021   NA 136 11/05/2021   K 4.1 11/05/2021   CL 103 11/05/2021   CREATININE 0.87 11/05/2021   BUN 24 (H) 11/05/2021   CO2 23 11/05/2021   TSH 0.737 11/05/2021   HGBA1C 8.0 (H) 10/16/2020   MICROALBUR <0.7 10/16/2020   Assessment/Plan:  Lynn Barnes is a 61 y.o. 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 [facility]),  Bipolar disorder ([place]) (02/15/2011), Cervical spondylosis without myelopathy, Depression, Depression, Diabetes mellitus, Dysautonomia ([place]) (10/24/2011), Dyskinesia, Dyslipidemia, History of nuclear stress test, Hyperlipidemia, IBS (irritable bowel syndrome) (02/15/2011), Insomnia (02/21/2011), Multinodular thyroid, Neuropathy, Orthostatic hypotension, Orthostatic hypotension, Parkinsonism ([place]), Parkinsonism ([employer]), POTS (postural orthostatic tachycardia syndrome), Radiculopathy, Resting tremor, Rotator cuff syndrome of left shoulder, S/P partial hysterectomy, Spondylosis without myelopathy, Subacute dyskinesia due to drugs(333.85), Tachycardia, Therapy, Thyroid nodule, and Tremor.  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  I have personally reviewed and have noted: 1) the patient's medical and social history 2) The patient's current medications and supplements 3) The patient's height, weight, and BMI have been recorded in the chart   Type 1 diabetes mellitus ([place]) Lab Results  Component Value Date   HGBA1C 8.0 (H) 10/16/2020   Uncontrolled, pt to continue current medical treatment humalog and tresiba as plans to f/u with endo soon   Hyperlipidemia Lab Results  Component Value Date   LDLCALC 84 10/16/2020   Uncontrolled,, pt to continue current statin lipior 40 mg as declines increased dose   Blood pressure elevated without history of HTN Pt with hx of POTs and orthostatic hypotension, currently on florinef, o/w stable,  continue current med tx   Followup: Return in about 1 year (around 11/07/2022).  Cathlean Cower, MD 11/09/2021 5:42 PM [facility]

## 2021-11-06 NOTE — Patient Instructions (Signed)
You had the flu shot today  Please continue all other medications as before, and refills have been done if requested - the tramadol  Please have the pharmacy call with any other refills you may need.  Please continue your efforts at being more active, low cholesterol diet, and weight control.  You are otherwise up to date with prevention measures today.  Please keep your appointments with your specialists as you may have planned - neurology and endocrinology  You will be contacted regarding the referral for: colonoscopy  We can hold on other lab testing today  Please make an Appointment to return for your 1 year visit, or sooner if needed

## 2021-11-07 ENCOUNTER — Other Ambulatory Visit: Payer: Self-pay | Admitting: Family Medicine

## 2021-11-07 NOTE — Telephone Encounter (Signed)
Called the patient and worked her in sooner for an apt since over a year since last seen. I will cancel the November apt she has scheduled.

## 2021-11-08 NOTE — Telephone Encounter (Signed)
Rx refill request approved per Dr. Corey's orders. 

## 2021-11-09 ENCOUNTER — Encounter: Payer: Self-pay | Admitting: Internal Medicine

## 2021-11-09 ENCOUNTER — Other Ambulatory Visit: Payer: Self-pay | Admitting: Neurology

## 2021-11-09 DIAGNOSIS — R030 Elevated blood-pressure reading, without diagnosis of hypertension: Secondary | ICD-10-CM | POA: Insufficient documentation

## 2021-11-09 DIAGNOSIS — I10 Essential (primary) hypertension: Secondary | ICD-10-CM | POA: Insufficient documentation

## 2021-11-09 NOTE — Assessment & Plan Note (Signed)
Cont's to follow with neurology

## 2021-11-09 NOTE — Assessment & Plan Note (Signed)
Lab Results  Component Value Date   HGBA1C 8.0 (H) 10/16/2020   Uncontrolled, pt to continue current medical treatment humalog and tresiba as plans to f/u with endo soon

## 2021-11-09 NOTE — Assessment & Plan Note (Signed)
Lab Results  Component Value Date   LDLCALC 84 10/16/2020   Uncontrolled,, pt to continue current statin lipior 40 mg as declines increased dose

## 2021-11-09 NOTE — Assessment & Plan Note (Signed)
Pt with hx of POTs and orthostatic hypotension, currently on florinef, o/w stable, continue current med tx

## 2021-11-09 NOTE — Assessment & Plan Note (Signed)
Age and sex appropriate education and counseling updated with regular exercise and diet Referrals for preventative services - pt to call for GYn appt soon, ok for colonoscopy Immunizations addressed - declines covid booster, shingrix, tdap but for flu shot today Smoking counseling  - none needed Evidence for depression or other mood disorder - none significant changes Most recent labs reviewed. I have personally reviewed and have noted: 1) the patient's medical and social history 2) The patient's current medications and supplements 3) The patient's height, weight, and BMI have been recorded in the chart

## 2021-11-14 ENCOUNTER — Ambulatory Visit: Payer: Medicare PPO | Admitting: Neurology

## 2021-11-14 ENCOUNTER — Encounter: Payer: Self-pay | Admitting: Neurology

## 2021-11-14 VITALS — BP 134/74 | HR 84 | Ht 67.0 in | Wt 163.0 lb

## 2021-11-14 DIAGNOSIS — E1043 Type 1 diabetes mellitus with diabetic autonomic (poly)neuropathy: Secondary | ICD-10-CM

## 2021-11-14 DIAGNOSIS — G90A Postural orthostatic tachycardia syndrome (POTS): Secondary | ICD-10-CM

## 2021-11-14 DIAGNOSIS — G20C Parkinsonism, unspecified: Secondary | ICD-10-CM

## 2021-11-14 DIAGNOSIS — I951 Orthostatic hypotension: Secondary | ICD-10-CM

## 2021-11-14 NOTE — Progress Notes (Signed)
PATIENT: Lynn Barnes DOB: [birth year]  REASON FOR VISIT: follow up on recent hsopitalization.  HISTORY FROM: patient and spouse    Chief Complaint  Patient presents with   Follow-up    RM 11. Last seen 08/20/20.  Went to [employer] last week d/t inbility to find words. They feel it was d/t dehydration. Did CT/labs and everything looked okay. She has increased water intake. No episode since.     HISTORY OF PRESENT ILLNESS:      11/14/21 Rv with Lynn Seat, Barnes, Interval history:   Lynn Lynn Barnes saw her 02-2021 with normal EMG, neuropathic changes in lower extremity nerve conductions,  11-05-2021:  Resent Hospitalization for hyperglycemia. She is a DM type 1.  Has had labs indicating dehydration. She had felt well earlier that day, neither vomiting nor diarrhea nor excessive diaphoresis.  Ct normal, reviewed and discussed with patient here   Lynn Barnes has been started on a trihexyphenidyl medication to treat tardive movements and she is currently taking tablets of 2 mg total by mouth 3 times a day with meals.  My limited observational time today tells me that it does reduce her tremor.  She has remained on Ultram as needed on scopolamine as needed when she travels she is still on Risperdal at bedtime 8 mg pindolol half tablet which is 2.5 mg by mouth twice daily she is on Latuda 120 mg by mouth at bedtime, she is taking Humalog insulin and Tresiba, gabapentin has been reduced significantly to 400 mg twice daily Florinef for low blood pressure is at 1.1 mg and she takes only half tablet by mouth twice daily she still takes carbidopa levodopa 50 over 200 mg 1 tablet by mouth twice daily and she is also on Lipitor 40 mg.   Provided through this office are Sinemet, Artane.      Saw Lynn Bellows, Lynn Barnes - 09/02/2021 4:00 PM EDT   Formatting of this note might be different from the original. Continue Tresiba 30 units daily  Humalog ICR 1:10 and adjust correction  1:50>200 (BG-200/50). Continue Dexcom    routine Rv.  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  Over the years tremors have increased and at times she had problems with orthostatic hypotension.  She does have a resting trauma which affects both upper extremities, she has noticed it with action as well as at rest.  Even that a DaTscan in November 2019 did not confirm Parkinson pathology I have wanted her to see Lynn Barnes For a differentiation between parkinsonism which could be drug-induced and a secondary disorder of true Parkinson disease which can sometimes still occur.  I also think that a deep brain stimulator sometimes treats our essential tremor patients better than high-dose medication.   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      No further falls, no panic attacks, anxiety reportedly well controlled.   DM type 1 childhood onset- with a dysautonomia and neuropathy. POTS followed by Lynn Barnes Tremors have responded very well to  Sinemet( start in 2017)  and after she took a "break' from carbidopa levodopa she noted how severe the tremor was when untreated.  12-2017 DAT scan negative for primary parkinson's disease.   61 y.o. 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 [facility]), Bipolar disorder ([place]) (02/15/2011), Cervical spondylosis without myelopathy, Depression, Depression, Diabetes  mellitus, Dysautonomia ([place]) (10/24/2011), Dyskinesia, Dyslipidemia, History of nuclear stress test, Hyperlipidemia, IBS (irritable bowel syndrome) (02/15/2011), Insomnia (02/21/2011), Multinodular thyroid, Neuropathy, Orthostatic hypotension, Orthostatic hypotension, Parkinsonism ([place]), Parkinsonism ([place]), POTS (postural orthostatic tachycardia syndrome), Radiculopathy, Resting tremor, Rotator cuff syndrome of left shoulder, S/P partial hysterectomy, Spondylosis without myelopathy, Subacute dyskinesia due to drugs(333.85), Tachycardia, Therapy, Thyroid nodule, and Tremor.   2-20202 Lynn Presto, NP  Lynn Barnes is a 61 y.o. 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  She was started on Sinemet about 2 years ago.  Sinemet helps but she still have some trouble walking and a tremor. She feels tremor is worse with activity like writing or drinking out of a cup.  She is able to perform all ADLs.  She does feel that she still has some trouble walking.  She has not had any falls.  She does not use any assistive devices.  DaTscan in November did not reveal any primary Parkinson's disease.  She is seen every three months by her psychiatrist, Lynn Barnes.  She continues Latuda, Risperdal, Valium and Ambien prescribed.  She reports that she continues to struggle with some anxiety but feels that she is stable overall.  She is seeing Lynn Barnes for POTS and reports that symptoms are controlled. She is taking Pindolol '5mg'$  BID.    HISTORY: (copied from Lynn Lynn Barnes's note on 01/05/2018) RV 01-05-2018, follow up after 6 month visit with NP, last seen May 2019 by Lynn Givens, NP.  Patient repots she is doing poorly, her legs have become very weak, shuffling gait and the feeling of " heavy -limbs".  " I can't walk more than 100 m without having to break". No SOB.  She feels her balance and fine motor skills are affected by tremor. "Penmanship has declined. Can't apply make up".  No falls  over 6 month, but several near misses.      HISTORY OF PRESENT ILLNESS:   I have the pleasure of meeting today again with [name], meanwhile 61 year old patient, I had placed on carbidopa levodopa after she presented in February with mouth, jaw and tongue trembling and tremors.  She feels that this has been beneficial .  She is calmer without any obvious tremor today she still has vocal cord Trembling.  She also reports a greater fluidity of movement.   She had no further POTs related problems, I had always suspected that her dysautonomia was related to her juvenile diabetes long-standing insulin dependence.   However, I would not expect dysautonomia to get better on Sinemet. Shy- Draeger ?      May 2018 , MM- NP  Ms. 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 She returns today for follow-up. She was started on Sinemet at the last visit. She reports this has been beneficial. She states that her tremor has improved. Reports that her gait and balance have remained the same. Denies any falls. She does report decreased appetite and has lost weight since her last visit in February. She  does report some trouble falling asleep. She reports that her tremor is primarily in the jaw and tongue. Denies any new neurological symptoms. He returns today for an evaluation.   HISTORY 04/02/16 Lynn. Brett Fairy: "there are times when my mouth, jaw and tongue trembling". Reglan induced? Mrs. Mcmaster has not used Reglan in the past., Her depression however has been treated with Geodon and Risperdal. Neuroleptic induced? [name]  She has been evaluated for Parkinsonism secondary induced due to medication here before, and one of her concerns is memory loss, the feeling of not tracking thought process all the way through.  I evaluated her memory last 8 year ago in Barnes 2017 when she scored 26 out of 30 points on a [place] cognitive assessment. Today she scored 25 out of 30 and I  will print those results for comparison below.  I do not think that Mrs. Deakins has early onset dementia but maybe a cognitive disorder. She should take prenatal vitamins and I will try Artane for tremor.   REVIEW OF SYSTEMS: Out of a complete 14 system review of symptoms, the patient complains only of the following symptoms, heat heat intolerance, tremor, passing out, agitation, depression, nervousness/anxiousness and all other reviewed systems are negative.  ALLERGIES: Allergies  Allergen Reactions   Klonopin [Clonazepam] Other (See Comments)    Severe weakness   Nsaids Other (See Comments)    Irritates stomach   Sulfonamide Derivatives Rash    HOME MEDICATIONS: Outpatient Medications Prior to Visit  Medication Sig Dispense Refill   atorvastatin (LIPITOR) 40 MG tablet TAKE 1 TABLET(40 MG) BY MOUTH DAILY 90 tablet 3   carbidopa-levodopa (SINEMET CR) 50-200 MG tablet TAKE 1 TABLET BY MOUTH TWICE DAILY. 270 tablet 3   fludrocortisone (FLORINEF) 0.1 MG tablet TAKE 1/2 TABLET BY MOUTH TWICE DAILY 90 tablet 3   gabapentin (NEURONTIN) 400 MG capsule TAKE 1 CAPSULE BY MOUTH TWICE DAILY 60 capsule 3   gabapentin (NEURONTIN) 800 MG tablet Take 1 tablet (800 mg total) by mouth 3 (three) times daily. 270 tablet 1   Glucagon 3 MG/DOSE POWD Place into the nose.     glucose blood test strip 1 each by Other route. 7 times per day   - as instructed     HUMALOG KWIKPEN 100 UNIT/ML KwikPen INJECT BEFORE EACH MEAL WITH 1 UNIT PER 10 GM CARBS PLUS CORRECTIN OF 1 UNIT PER 50 MG     insulin degludec (TRESIBA) 100 UNIT/ML FlexTouch Pen Inject 27 Units into the skin daily.      LATUDA 120 MG TABS Take 1 tablet by mouth at bedtime.     pindolol (VISKEN) 5 MG tablet TAKE 1/2 TABLET(2.5 MG) BY MOUTH TWICE DAILY 90 tablet 3   risperidone (RISPERDAL) 4 MG tablet Take 8 mg by mouth at bedtime.  0   scopolamine (TRANSDERM SCOP, 1.5 MG,) 1 MG/3DAYS Place 1 patch (1.5 mg total) onto the skin every 3 (three) days. 10  patch 12   traMADol (ULTRAM) 50 MG tablet TAKE 1 TABLET(50 MG) BY MOUTH EVERY 6 HOURS AS NEEDED FOR MODERATE PAIN 120 tablet 2   trihexyphenidyl (ARTANE) 2 MG tablet Take 1 tablet (2 mg total) by mouth 3 (three) times daily with meals. 90 tablet 11   zolpidem (AMBIEN) 10 MG tablet Take 10 mg by mouth at bedtime.  1   predniSONE (DELTASONE) 10 MG tablet Take 3 tablets (30 mg total) by mouth daily with breakfast. 15 tablet 0   No facility-administered medications prior to visit.  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 ([place])    Bipolar disorder ([place]) 02/15/2011   Cervical spondylosis without myelopathy    Depression    Depression    Diabetes mellitus    sees Lynn Altheimer/endo   Dysautonomia ([place]) 10/24/2011   Dyskinesia     subacute,due to drugs   Dyslipidemia    History of nuclear stress test    Myoview 11/16: EF 81%, no ischemia; Low Risk   Hyperlipidemia    IBS (irritable bowel syndrome) 02/15/2011   Insomnia 02/21/2011   Multinodular thyroid    with large dominant solid nodule with calcifications in the lower left pole benign by FNA in 1/12 and stable by repeat ulltrasound in 4/13   Neuropathy    with paresthesias in feet   Orthostatic hypotension    Orthostatic hypotension    Parkinsonism (HCC)    due to prolonged prozac/risperdl for yrs/Lynn Dohmeir   Parkinsonism (HCC)    POTS (postural orthostatic tachycardia syndrome)    Radiculopathy    cervical   Resting tremor    Rotator cuff syndrome of left shoulder    S/P partial hysterectomy    Spondylosis without myelopathy    Subacute dyskinesia due to drugs(333.85)    Lynn Nneoma Harral/neuro   Tachycardia    syndrome   Therapy    on therapy by psychiatrist using sebutex    Thyroid nodule    s/p biopsy - benign approx jan 2012   Tremor     PAST  SURGICAL HISTORY: Past Surgical History:  Procedure Laterality Date   ACROMIOPLASTY Left 2002   Lynn [place] Lighter   BREAST EXCISIONAL BIOPSY Right    benign   BREAST LUMPECTOMY Right    herniated disc cervical spine     lower back  1992   OOPHORECTOMY     PARTIAL HYSTERECTOMY     SHOULDER SURGERY Left    SPINE SURGERY  feb 2012   c5-6 fusion, Lynn Rolena Infante   THYROID LOBECTOMY  11/11   nodule biopsy, benign    FAMILY HISTORY: Family History  Problem Relation Age of Onset   Lung cancer Mother    Heart disease Father    Hypertension Father    Pneumonia Father    Heart disease Paternal Grandmother    ALS Paternal Grandmother    Hypertension Sister     SOCIAL HISTORY: Social History   Socioeconomic History   Marital status: Married    Spouse name: Not on file   Number of children: 0   Years of education: BA   Highest education level: Not on file  Occupational History   Occupation: TEACHER    Employer: [employer]  Tobacco Use   Smoking status: Never   Smokeless tobacco: Never  Vaping Use   Vaping Use: Never used  Substance and Sexual Activity   Alcohol use: No    Alcohol/week: 0.0 standard drinks of alcohol   Drug use: No   Sexual activity: Never  Other Topics Concern   Not on file  Social History Narrative   Patient is single and lives with my partner Anne Ng [place]).   Patient is disabled.   Patient is right-handed.   Patient has a Haematologist.   Patient drinks one soda every other  day.   Social Determinants of Health   Financial Resource Strain: Low Risk  (08/21/2020)   Overall Financial Resource Strain (CARDIA)    Difficulty of Paying Living Expenses: Not hard at all  Food Insecurity: No Food Insecurity (08/21/2020)   Hunger Vital Sign    Worried About Running Out of Food in the Last Year: Never true    Ran Out of Food in the Last Year: Never true  Transportation Needs: No Transportation Needs (08/21/2020)   PRAPARE - Civil engineer, contracting (Medical): No    Lack of Transportation (Non-Medical): No  Physical Activity: Sufficiently Active (08/21/2020)   Exercise Vital Sign    Days of Exercise per Week: 3 days    Minutes of Exercise per Session: 60 min  Stress: No Stress Concern Present (08/21/2020)   [place]    Feeling of Stress : Not at all  Social Connections: Moderately Isolated (08/21/2020)   Social Connection and Isolation Panel [NHANES]    Frequency of Communication with Friends and Family: More than three times a week    Frequency of Social Gatherings with Friends and Family: More than three times a week    Attends Religious Services: Never    Marine scientist or Organizations: No    Attends Archivist Meetings: Never    Marital Status: Married  Human resources officer Violence: Not At Risk (08/21/2020)   Humiliation, Afraid, Rape, and Kick questionnaire    Fear of Current or Ex-Partner: No    Emotionally Abused: No    Physically Abused: No    Sexually Abused: No      PHYSICAL EXAM  Vitals:   11/14/21 1354  BP: 134/74  Pulse: 84  Weight: 163 lb (73.9 kg)  Barnes: '5\' 7"'$  (1.702 m)   Body mass index is 25.53 kg/m.  Generalized: Well developed, in no acute distress  Cardiology: normal rate and rhythm, no murmur noted Neurological examination  Mentation: Alert oriented to time, place, history taking. Follows all commands, and her  speech and language are fluent Cranial nerve : MASKED FACE: Pupils were equal in size and shape and reactive to light. Extraocular movements were full, visual field were full on confrontational test.  Facial sensation and strength were normal. Uvula tongue midline. Head turning and shoulder shrug  were normal and symmetric. Motor: Full strength and bulk with ROM of bilateral upper extremities.  4/5 of bilateral lower extremities.   No bradykinesia noted today.  She does have tremor of her  jaw that is worse with action. No cogwheeling.  Sensory: deferred.  Coordination: Cerebellar testing reveals good finger-nose-finger bilaterally.  There is tremor at rest more than at action. Strong grip.  Gait and station: Gait is stable.  The patient reports that she is sometimes furniture surfing or looking for wall to hold onto and that her turns are fragmented.  Her highest risk of falling is with turning bending forwards or getting something from a higher shelf, stretching upwards.  Reflexes: Deep tendon reflexes are decreased but symmetric bilaterally in upper and lower extremities,.   DIAGNOSTIC DATA (LABS, IMAGING, TESTING) - I reviewed patient records, labs, notes, testing and imaging myself where available.     08/21/2014    3:26 PM  MMSE - Mini Mental State Exam  Orientation to time 5  Orientation to Place 5  Registration 3  Attention/ Calculation 5  Recall 3  Language- name 2 objects 2  Language- repeat 1  Language- follow 3 step command 2  Language- read & follow direction 1  Write a sentence 1  Copy design 1  Total score 29     Lab Results  Component Value Date   WBC 7.7 11/05/2021   HGB 14.9 11/05/2021   HCT 42.0 11/05/2021   MCV 91.3 11/05/2021   PLT 297 11/05/2021      Component Value Date/Time   NA 136 11/05/2021 0712   NA 143 12/22/2019 1506   K 4.1 11/05/2021 0712   CL 103 11/05/2021 0712   CO2 23 11/05/2021 0712   GLUCOSE 371 (H) 11/05/2021 0712   BUN 24 (H) 11/05/2021 0712   BUN 17 12/22/2019 1506   CREATININE 0.87 11/05/2021 0712   CREATININE 0.87 07/06/2015 1523   CALCIUM 9.2 11/05/2021 0712   PROT 7.1 11/05/2021 0712   PROT 6.0 01/05/2018 1125   ALBUMIN 3.9 11/05/2021 0712   ALBUMIN 3.9 01/05/2018 1125   AST 27 11/05/2021 0712   ALT 28 11/05/2021 0712   ALKPHOS 83 11/05/2021 0712   BILITOT 1.0 11/05/2021 0712   BILITOT 0.4 01/05/2018 1125   GFRNONAA >60 11/05/2021 0712   GFRAA 77 12/22/2019 1506   Lab Results  Component Value Date    CHOL 162 10/16/2020   HDL 63.80 10/16/2020   LDLCALC 84 10/16/2020   TRIG 71.0 10/16/2020   CHOLHDL 3 10/16/2020   Lab Results  Component Value Date   HGBA1C 8.0 (H) 10/16/2020   Lab Results  Component Value Date   [id number] 989 10/16/2020   Lab Results  Component Value Date   TSH 0.737 11/05/2021       ASSESSMENT AND PLAN  Patient is long established here with Secondary parkinsonism, tremor dominant, responding to Dopamine therapy.   Overall Mrs. Chalk is doing well on Sinemet.  She has noted about a 50% improvement in her symptoms since increasing dose to 3 times a day about a year ago.  She now takes sinemet tid 2 tab each time.   She does continue to have concerns with her gait and tremor.  Tremor does seem to be worse with activity.  DaTscan November 2019 had not revealed a primary Parkinson's disease.  Artane has helped to reduce tremor as well.   She continues close follow-up with her psychiatrist and feels stable from mental health standpoint.   She is also working closely with cardiology for POTS.   Patient has labs with [facility].  No blood work needed.  I spent 25  minutes with the patient. About  50% of this time was spent counseling and educating patient on plan of care and medications.    Lynn Barnes , Barnes  11/14/2021, 2:54 PM [facility] [address] [place], [place] [address] [phone]

## 2021-11-14 NOTE — Patient Instructions (Signed)
Trihexyphenidyl Tablets What is this medication? TRIHEXYPHENIDYL (trye hex ee FEN i dil) treats movement disorders, including those caused by Parkinson disease and some medications. It works by balancing substances in your brain that help manage body movements and coordination. This reduces symptoms, such as body stiffness and tremors. This medicine may be used for other purposes; ask your health care provider or pharmacist if you have questions. COMMON BRAND NAME(S): Artane What should I tell my care team before I take this medication? They need to know if you have any of these conditions: Glaucoma Heart disease High blood pressure Kidney disease Liver disease Prostate problems An unusual or allergic reaction to trihexyphenidyl, lactose, other medications, foods, dyes, or preservatives Pregnant or trying to get pregnant Breast-feeding How should I use this medication? Take this medication by mouth with a full glass of water. Take it as directed on the prescription label. Do not suddenly stop taking your medication because you may develop a severe reaction. Keep taking it unless your care team tells you to stop. Talk to your care team about the use of this medication in children. Special care may be needed. People over 101 years of age may have a stronger reaction and need a smaller dose. Overdosage: If you think you have taken too much of this medicine contact a poison control center or emergency room at once. NOTE: This medicine is only for you. Do not share this medicine with others. What if I miss a dose? If you miss a dose, take it as soon as you can. If it is almost time for your next dose, take only that dose. Do not take double or extra doses. What may interact with this medication? Benztropine Certain medications for bladder problems, such as oxybutynin, tolterodine Certain medications for breathing problems, such as ipratropium and tiotropium Certain medications for certain  stomach or intestinal problems, such as propantheline, homatropine methylbromide, glycopyrrolate, atropine, belladonna, and dicyclomine Certain medications for travel sickness, such as scopolamine Levodopa This list may not describe all possible interactions. Give your health care provider a list of all the medicines, herbs, non-prescription drugs, or dietary supplements you use. Also tell them if you smoke, drink alcohol, or use illegal drugs. Some items may interact with your medicine. What should I watch for while using this medication? Visit your care team for regular checks on your progress. Tell your care team if your symptoms do not start to get better or if they get worse. Your mouth may get dry. Chewing sugarless gum or sucking hard candy, and drinking plenty of water may help. Contact your care team if the problem does not go away or is severe. This medication may cause dry eyes and blurred vision. If you wear contact lenses, you may feel some discomfort. Lubricating eye drops may help. See your care team if the problem does not go away or is severe. This medication may affect your coordination, reaction time, or judgement. Do not drive or operate machinery until you know how this medication affects you. Sit up or stand slowly to reduce the risk of dizzy or fainting spells. Drinking alcohol with this medication can increase the risk of these side effects. What side effects may I notice from receiving this medication? Side effects that you should report to your care team as soon as possible: Allergic reactions--skin rash, itching, hives, swelling of the face, lips, tongue, or throat Fever that does not go away, decreased sweating Sudden eye pain or change in vision such as blurry  vision, seeing halos around lights, vision loss Side effects that usually do not require medical attention (report to your care team if they continue or are bothersome): Anxiety,  nervousness Confusion Dizziness Dry mouth Nausea Vomiting This list may not describe all possible side effects. Call your doctor for medical advice about side effects. You may report side effects to FDA at 1-800-FDA-1088. Where should I keep my medication? Keep out of the reach of children and pets. Store between 15 and 30 degrees C (59 and 86 degrees F). Get rid of any unused medication after the expiration date. To get rid of medications that are no longer needed or have expired: Take the medication to a take-back program. Check with your pharmacy or law enforcement to find a location. If you cannot return the medication, check the label or package insert to see if the medication should be thrown out in the garbage or flushed down the toilet. If you are not sure, ask your care team. If it is safe to put it in the trash, empty the medication out of the container. Mix the medication with cat litter, dirt, coffee grounds, or other unwanted substance. Seal the mixture in a bag or container. Put it in the trash. NOTE: This sheet is a summary. It may not cover all possible information. If you have questions about this medicine, talk to your doctor, pharmacist, or health care provider.  2023 Elsevier/Gold Standard (2007-03-20 00:00:00)

## 2021-11-28 DIAGNOSIS — F3189 Other bipolar disorder: Secondary | ICD-10-CM | POA: Diagnosis not present

## 2021-12-04 ENCOUNTER — Other Ambulatory Visit: Payer: Self-pay | Admitting: Internal Medicine

## 2021-12-07 ENCOUNTER — Other Ambulatory Visit: Payer: Self-pay | Admitting: Internal Medicine

## 2021-12-07 NOTE — Telephone Encounter (Signed)
Please refill as per office routine med refill policy (all routine meds to be refilled for 3 mo or monthly (per pt preference) up to one year from last visit, then month to month grace period for 3 mo, then further med refills will have to be denied) ? ?

## 2021-12-11 ENCOUNTER — Ambulatory Visit: Payer: Medicare PPO | Admitting: Adult Health

## 2021-12-12 DIAGNOSIS — Z86010 Personal history of colonic polyps: Secondary | ICD-10-CM | POA: Diagnosis not present

## 2021-12-12 DIAGNOSIS — Z83719 Family history of colon polyps, unspecified: Secondary | ICD-10-CM | POA: Diagnosis not present

## 2021-12-26 DIAGNOSIS — F3189 Other bipolar disorder: Secondary | ICD-10-CM | POA: Diagnosis not present

## 2021-12-28 DIAGNOSIS — Z1231 Encounter for screening mammogram for malignant neoplasm of breast: Secondary | ICD-10-CM | POA: Diagnosis not present

## 2021-12-28 LAB — HM MAMMOGRAPHY

## 2021-12-30 DIAGNOSIS — K5730 Diverticulosis of large intestine without perforation or abscess without bleeding: Secondary | ICD-10-CM | POA: Diagnosis not present

## 2021-12-30 DIAGNOSIS — Z09 Encounter for follow-up examination after completed treatment for conditions other than malignant neoplasm: Secondary | ICD-10-CM | POA: Diagnosis not present

## 2021-12-30 DIAGNOSIS — Z86010 Personal history of colonic polyps: Secondary | ICD-10-CM | POA: Diagnosis not present

## 2021-12-30 DIAGNOSIS — K648 Other hemorrhoids: Secondary | ICD-10-CM | POA: Diagnosis not present

## 2021-12-30 DIAGNOSIS — Z83719 Family history of colon polyps, unspecified: Secondary | ICD-10-CM | POA: Diagnosis not present

## 2021-12-31 DIAGNOSIS — R921 Mammographic calcification found on diagnostic imaging of breast: Secondary | ICD-10-CM | POA: Diagnosis not present

## 2021-12-31 LAB — HM MAMMOGRAPHY

## 2022-01-06 ENCOUNTER — Encounter: Payer: Self-pay | Admitting: Internal Medicine

## 2022-01-14 ENCOUNTER — Other Ambulatory Visit: Payer: Self-pay | Admitting: Radiology

## 2022-01-14 DIAGNOSIS — N6489 Other specified disorders of breast: Secondary | ICD-10-CM | POA: Diagnosis not present

## 2022-01-14 DIAGNOSIS — R921 Mammographic calcification found on diagnostic imaging of breast: Secondary | ICD-10-CM | POA: Diagnosis not present

## 2022-01-21 DIAGNOSIS — E1065 Type 1 diabetes mellitus with hyperglycemia: Secondary | ICD-10-CM | POA: Diagnosis not present

## 2022-02-04 ENCOUNTER — Other Ambulatory Visit: Payer: Self-pay

## 2022-02-04 MED ORDER — FLUDROCORTISONE ACETATE 0.1 MG PO TABS: 50.0000 ug | ORAL_TABLET | Freq: Two times a day (BID) | ORAL | 0 refills | Status: DC

## 2022-02-12 ENCOUNTER — Encounter: Payer: Self-pay | Admitting: Internal Medicine

## 2022-02-12 DIAGNOSIS — F5105 Insomnia due to other mental disorder: Secondary | ICD-10-CM | POA: Diagnosis not present

## 2022-02-12 DIAGNOSIS — F3189 Other bipolar disorder: Secondary | ICD-10-CM | POA: Diagnosis not present

## 2022-02-14 DIAGNOSIS — H35361 Drusen (degenerative) of macula, right eye: Secondary | ICD-10-CM | POA: Diagnosis not present

## 2022-02-14 DIAGNOSIS — E103293 Type 1 diabetes mellitus with mild nonproliferative diabetic retinopathy without macular edema, bilateral: Secondary | ICD-10-CM | POA: Diagnosis not present

## 2022-02-14 DIAGNOSIS — H40013 Open angle with borderline findings, low risk, bilateral: Secondary | ICD-10-CM | POA: Diagnosis not present

## 2022-02-14 DIAGNOSIS — H2513 Age-related nuclear cataract, bilateral: Secondary | ICD-10-CM | POA: Diagnosis not present

## 2022-02-14 LAB — HM DIABETES EYE EXAM

## 2022-02-19 ENCOUNTER — Telehealth: Payer: Self-pay | Admitting: Internal Medicine

## 2022-02-19 MED ORDER — TRAMADOL HCL 50 MG PO TABS: ORAL_TABLET | ORAL | 2 refills | Status: DC

## 2022-02-19 NOTE — Telephone Encounter (Signed)
Ok done erx 

## 2022-02-19 NOTE — Telephone Encounter (Signed)
Caller & Relationship to patient: self   Call back [phone]   Date of last office visit:  11/06/2021   Date of next office visit: not scheduled yet - will schedule before September - did not need to be seen before then   Medication(s) to be refilled:  Tramadol        Preferred Pharmacy:  [facility] on [address] [place]

## 2022-03-05 ENCOUNTER — Other Ambulatory Visit: Payer: Self-pay | Admitting: Internal Medicine

## 2022-03-07 ENCOUNTER — Other Ambulatory Visit: Payer: Self-pay | Admitting: Internal Medicine

## 2022-03-13 DIAGNOSIS — F5105 Insomnia due to other mental disorder: Secondary | ICD-10-CM | POA: Diagnosis not present

## 2022-03-13 DIAGNOSIS — F3189 Other bipolar disorder: Secondary | ICD-10-CM | POA: Diagnosis not present

## 2022-03-19 ENCOUNTER — Other Ambulatory Visit: Payer: Self-pay | Admitting: Internal Medicine

## 2022-03-20 ENCOUNTER — Other Ambulatory Visit: Payer: Self-pay

## 2022-03-20 MED ORDER — FLUDROCORTISONE ACETATE 0.1 MG PO TABS: 0.0500 mg | ORAL_TABLET | Freq: Two times a day (BID) | ORAL | 0 refills | Status: DC

## 2022-03-20 NOTE — Telephone Encounter (Signed)
Pt's medication was sent to pt's pharmacy as requested. Confirmation received.  °

## 2022-03-25 ENCOUNTER — Telehealth: Payer: Self-pay | Admitting: Internal Medicine

## 2022-03-25 ENCOUNTER — Other Ambulatory Visit: Payer: Self-pay | Admitting: Internal Medicine

## 2022-03-25 NOTE — Telephone Encounter (Signed)
*  STAT* If patient is at the pharmacy, call can be transferred to refill team.   1. Which medications need to be refilled? (please list name of each medication and dose if known)  fludrocortisone (FLORINEF) 0.1 MG tablet  2. Which pharmacy/location (including street and city if local pharmacy) is medication to be sent to? [id number] - [place], [place] - [address]  3. Do they need a 30 day or 90 day supply?  90 day supply

## 2022-03-25 NOTE — Telephone Encounter (Signed)
Pt's medication was sent to pt's pharmacy as requested. Confirmation received.  °

## 2022-03-27 NOTE — Progress Notes (Signed)
Cardiology Office Note   Date:  03/28/2022   ID:  Lynn Barnes, DOB Jul 31, 1960, MRN [id number]  PCP:  Biagio Borg, MD  Cardiologist:   Dorris Carnes, MD    Pt returns for f/u of autonomic dysfunction.   History of Present Illness: Lynn Barnes is a 62 y.o. female with a history of parkinsons and autonomic dysfunction   I saw her in the fall of 2020  Since seen she says she has been doing pretty good   She did have a syncopal spell about 1 month ago  Says she is drinknig   Appetite is OK    I saw the pt in clinic in Nov 2022  Since seen she says she has been doing pretty good  No syncope or severe dizziness    Breathing is OK   Food going OK  No signficiant palpitations   Current Meds  Medication Sig   atorvastatin (LIPITOR) 40 MG tablet TAKE 1 TABLET(40 MG) BY MOUTH DAILY   busPIRone (BUSPAR) 10 MG tablet Take 20 mg by mouth 2 (two) times daily.   carbidopa-levodopa (SINEMET CR) 50-200 MG tablet TAKE 1 TABLET BY MOUTH TWICE DAILY.   fludrocortisone (FLORINEF) 0.1 MG tablet TAKE 1/2 TABLET(0.05 MG) BY MOUTH TWICE DAILY   gabapentin (NEURONTIN) 400 MG capsule TAKE 1 CAPSULE BY MOUTH TWICE DAILY   Glucagon 3 MG/DOSE POWD Place into the nose.   HUMALOG KWIKPEN 100 UNIT/ML KwikPen INJECT BEFORE EACH MEAL WITH 1 UNIT PER 10 GM CARBS PLUS CORRECTIN OF 1 UNIT PER 50 MG   insulin degludec (TRESIBA) 100 UNIT/ML FlexTouch Pen Inject 27 Units into the skin daily.    LATUDA 120 MG TABS Take 1 tablet by mouth at bedtime.   pindolol (VISKEN) 5 MG tablet TAKE 1/2 TABLET(2.5 MG) BY MOUTH TWICE DAILY   risperidone (RISPERDAL) 4 MG tablet Take 8 mg by mouth at bedtime.   scopolamine (TRANSDERM SCOP, 1.5 MG,) 1 MG/3DAYS Place 1 patch (1.5 mg total) onto the skin every 3 (three) days.   traMADol (ULTRAM) 50 MG tablet TAKE 1 TABLET(50 MG) BY MOUTH EVERY 6 HOURS AS NEEDED FOR MODERATE PAIN   trihexyphenidyl (ARTANE) 2 MG tablet Take 1 tablet (2 mg total) by mouth 3 (three) times daily with meals.    zolpidem (AMBIEN) 10 MG tablet Take 10 mg by mouth at bedtime.     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 ([place])    Bipolar disorder ([place]) 02/15/2011   Cervical spondylosis without myelopathy    Depression    Depression    Diabetes mellitus    sees Dr Altheimer/endo   Dysautonomia ([place]) 10/24/2011   Dyskinesia     subacute,due to drugs   Dyslipidemia    History of nuclear stress test    Myoview 11/16: EF 81%, no ischemia; Low Risk   Hyperlipidemia    IBS (irritable bowel syndrome) 02/15/2011   Insomnia 02/21/2011   Multinodular thyroid    with large dominant solid nodule with calcifications in the lower left pole benign by FNA in 1/12 and stable by repeat ulltrasound in 4/13   Neuropathy    with paresthesias in feet   Orthostatic hypotension  Orthostatic hypotension    Parkinsonism    due to prolonged prozac/risperdl for yrs/Dr Dohmeir   Parkinsonism    POTS (postural orthostatic tachycardia syndrome)    Radiculopathy    cervical   Resting tremor    Rotator cuff syndrome of left shoulder    S/P partial hysterectomy    Spondylosis without myelopathy    Subacute dyskinesia due to drugs(333.85)    dr dohmeier/neuro   Tachycardia    syndrome   Therapy    on therapy by psychiatrist using sebutex    Thyroid nodule    s/p biopsy - benign approx jan 2012   Tremor     Past Surgical History:  Procedure Laterality Date   ACROMIOPLASTY Left 2002   Dr [place] Lighter   BREAST EXCISIONAL BIOPSY Right    benign   BREAST LUMPECTOMY Right    herniated disc cervical spine     lower back  1992   OOPHORECTOMY     PARTIAL HYSTERECTOMY     SHOULDER SURGERY Left    SPINE SURGERY  feb 2012   c5-6 fusion, Dr Rolena Infante   THYROID LOBECTOMY  11/11   nodule  biopsy, benign     Social History:  The patient  reports that she has never smoked. She has never used smokeless tobacco. She reports that she does not drink alcohol and does not use drugs.   Family History:  The patient's family history includes ALS in her paternal grandmother; Heart disease in her father and paternal grandmother; Hypertension in her father and sister; Lung cancer in her mother; Pneumonia in her father.    ROS:  Please see the history of present illness. All other systems are reviewed and  Negative to the above problem except as noted.    PHYSICAL EXAM: VS:  BP 110/72   Pulse 77   Ht 5' 7"$  (1.702 m)   Wt 155 lb (70.3 kg)   BMI 24.28 kg/m    GEN: Well nourished, well developed, in no acute distress  HEENT: normal  Neck: JVP is normal  No  carotid bruit Cardiac: RRR; no murmurs,no LE  edema  Respiratory:  clear to auscultation  GI: soft, nontender, nondistended, + BS  No hepatomegaly  MS: no deformity Moving all extremities   Skin: warm, sweaty   Neuro:  Deferred  Psych: euthymic mood, full affect   EKG:  EKG is  not ordered NSR 77 bpm  Occasional PVC     Lipid Panel    Component Value Date/Time   CHOL 162 10/16/2020 1444   CHOL 156 12/22/2019 1506   TRIG 71.0 10/16/2020 1444   HDL 63.80 10/16/2020 1444   HDL 56 12/22/2019 1506   CHOLHDL 3 10/16/2020 1444   VLDL 14.2 10/16/2020 1444   LDLCALC 84 10/16/2020 1444   LDLCALC 81 12/22/2019 1506      Wt Readings from Last 3 Encounters:  03/28/22 155 lb (70.3 kg)  11/14/21 163 lb (73.9 kg)  11/06/21 162 lb (73.5 kg)      ASSESSMENT AND PLAN:  1  Autonomic dysfunction Pt doing fairly well   NO syncope  Stays active    2  Dyslipidemia  Continue lipitor   Will check lipomed   Does have vascluar calcifications       3  Parkinson's   Continue on Sinemet  Follows in neuro    4  DM   Follows [facility]   Dr Burney Gauze    Stay active   Plan  follow up in 1 year     Current medicines are reviewed at  length with the patient today.  The patient does not have concerns regarding medicines.  Signed, Dorris Carnes, MD  03/28/2022 10:54 AM    [address], [place], [place]  [address] Phone: [phone]; Fax: [phone]

## 2022-03-28 ENCOUNTER — Encounter: Payer: Self-pay | Admitting: Internal Medicine

## 2022-03-28 ENCOUNTER — Ambulatory Visit: Payer: Medicare PPO | Attending: Internal Medicine | Admitting: Internal Medicine

## 2022-03-28 VITALS — BP 110/72 | HR 77 | Ht 67.0 in | Wt 155.0 lb

## 2022-03-28 DIAGNOSIS — E785 Hyperlipidemia, unspecified: Secondary | ICD-10-CM

## 2022-03-28 DIAGNOSIS — Z131 Encounter for screening for diabetes mellitus: Secondary | ICD-10-CM | POA: Diagnosis not present

## 2022-03-28 NOTE — Patient Instructions (Signed)
Medication Instructions:   *If you need a refill on your cardiac medications before your next appointment, please call your pharmacy*   Lab Work:  NMR, APOB, HGBA1C  If you have labs (blood work) drawn today and your tests are completely normal, you will receive your results only by: [facility] (if you have MyChart) OR A paper copy in the mail If you have any lab test that is abnormal or we need to change your treatment, we will call you to review the results.   Testing/Procedures:    Follow-Up: At [facility], you and your health needs are our priority.  As part of our continuing mission to provide you with exceptional heart care, we have created designated Provider Care Teams.  These Care Teams include your primary Cardiologist (physician) and Advanced Practice Providers (APPs -  Physician Assistants and Nurse Practitioners) who all work together to provide you with the care you need, when you need it.  We recommend signing up for the patient portal called "MyChart".  Sign up information is provided on this After Visit Summary.  MyChart is used to connect with patients for Virtual Visits (Telemedicine).  Patients are able to view lab/test results, encounter notes, upcoming appointments, etc.  Non-urgent messages can be sent to your provider as well.   To learn more about what you can do with MyChart, go to [email or web address].    Your next appointment:   ONE  year(s)  Provider:   Dorris Carnes, MD     Other Instructions

## 2022-03-29 LAB — NMR, LIPOPROFILE
Cholesterol, Total: 182 mg/dL (ref 100–199)
HDL Particle Number: 39.5 umol/L (ref >=30.5)
HDL-C: 61 mg/dL (ref >39)
LDL Particle Number: 1368 nmol/L — ABNORMAL HIGH (ref <1000)
LDL Size: 21 nm (ref >20.5)
LDL-C (NIH Calc): 106 mg/dL — ABNORMAL HIGH (ref 0–99)
LP-IR Score: 55 — ABNORMAL HIGH (ref <=45)
Small LDL Particle Number: 347 nmol/L (ref <=527)
Triglycerides: 83 mg/dL (ref 0–149)

## 2022-03-29 LAB — HEMOGLOBIN A1C
Est. average glucose Bld gHb Est-mCnc: 206 mg/dL
Hgb A1c MFr Bld: 8.8 % — ABNORMAL HIGH (ref 4.8–5.6)

## 2022-03-29 LAB — APOLIPOPROTEIN B: Apolipoprotein B: 97 mg/dL — ABNORMAL HIGH (ref <90)

## 2022-03-31 ENCOUNTER — Telehealth: Payer: Self-pay

## 2022-03-31 DIAGNOSIS — Z131 Encounter for screening for diabetes mellitus: Secondary | ICD-10-CM

## 2022-03-31 DIAGNOSIS — Z79899 Other long term (current) drug therapy: Secondary | ICD-10-CM

## 2022-03-31 DIAGNOSIS — E785 Hyperlipidemia, unspecified: Secondary | ICD-10-CM

## 2022-03-31 MED ORDER — EZETIMIBE 10 MG PO TABS: 10.0000 mg | ORAL_TABLET | Freq: Every day | ORAL | 3 refills | Status: DC

## 2022-03-31 NOTE — Telephone Encounter (Signed)
The patient has been notified of the result and verbalized understanding.  All questions (if any) were answered. Stephani Police, RN 03/31/2022 4:13 PM

## 2022-03-31 NOTE — Telephone Encounter (Signed)
-----   Message from Fay Records, MD sent at 03/31/2022  1:36 PM EST ----- LDL 106    CT in past showed plaquing on arteries    LDL should be lower    I would recomm adding Zetia to regimen   Follow up lipid and liver panel in 8 wks   A1C high at 8.8   Watch diet    Pt follows with Marshall Cork

## 2022-04-04 ENCOUNTER — Other Ambulatory Visit: Payer: Self-pay | Admitting: Internal Medicine

## 2022-04-04 ENCOUNTER — Other Ambulatory Visit: Payer: Self-pay

## 2022-04-04 MED ORDER — PINDOLOL 5 MG PO TABS: ORAL_TABLET | ORAL | 3 refills | Status: DC

## 2022-04-04 NOTE — Telephone Encounter (Signed)
Pt's medication was sent to pt's pharmacy as requested. Confirmation received.  °

## 2022-04-08 ENCOUNTER — Telehealth: Payer: Self-pay

## 2022-04-08 DIAGNOSIS — E10649 Type 1 diabetes mellitus with hypoglycemia without coma: Secondary | ICD-10-CM | POA: Diagnosis not present

## 2022-04-08 DIAGNOSIS — E042 Nontoxic multinodular goiter: Secondary | ICD-10-CM | POA: Diagnosis not present

## 2022-04-08 DIAGNOSIS — E1065 Type 1 diabetes mellitus with hyperglycemia: Secondary | ICD-10-CM | POA: Diagnosis not present

## 2022-04-08 DIAGNOSIS — E1040 Type 1 diabetes mellitus with diabetic neuropathy, unspecified: Secondary | ICD-10-CM | POA: Diagnosis not present

## 2022-04-08 DIAGNOSIS — E103299 Type 1 diabetes mellitus with mild nonproliferative diabetic retinopathy without macular edema, unspecified eye: Secondary | ICD-10-CM | POA: Diagnosis not present

## 2022-04-08 NOTE — Patient Outreach (Signed)
  Care Coordination   Care Coordination  Visit Note   04/08/2022 Name: Lynn Barnes MRN: [id number] DOB: Barnes 20, 1962  Lynn Barnes is a 62 y.o. year old female who sees Lynn Borg, MD for primary care. I spoke with  Lynn Barnes by phone today.  What matters to the patients health and wellness today?  RNCM called to discussed care coordination program. Patient reports this is not a good time and request to schedule a time for telephone assessment.     Goals Addressed             This Visit's Progress    Care Coordination Activities       Interventions Today    Flowsheet Row Most Recent Value  General Interventions   General Interventions Discussed/Reviewed General Interventions Discussed  [discussed briefly care coordination program. patient scheduled for assessment per patient request.]            SDOH assessments and interventions completed:  No  Care Coordination Interventions:  Yes, provided   Follow up plan: Follow up call scheduled for 04/10/22    Encounter Outcome:  Pt. Visit Completed

## 2022-04-10 ENCOUNTER — Ambulatory Visit: Payer: Self-pay

## 2022-04-10 NOTE — Patient Outreach (Signed)
  Care Coordination   Initial Visit Note   04/10/2022 Name: Lynn Barnes MRN: [id number] DOB: Nov 20, 1960  Lynn Barnes is a 62 y.o. year old female who sees Biagio Borg, MD for primary care. I spoke with  Lynn Barnes by phone today.  What matters to the patients health and wellness today?  RNCM discussed care coordination program. Patient denies any care coordination, disease management/education needs or resource needs at this time.   Goals Addressed             This Visit's Progress    COMPLETED: Care Coordination Activities       Interventions Today    Flowsheet Row Most Recent Value  General Interventions   General Interventions Discussed/Reviewed General Interventions Discussed, Labs, Durable Medical Equipment (DME), Doctor Visits  [discussed care coordination program. discussed health conditions and management of health. provided RNCM contact number and encouraged to call with care coordination needs prn]  Labs --  [A1C 8.8 on 04/08/22]  Doctor Visits Discussed/Reviewed Doctor Visits Discussed, Doctor Visits Reviewed  [discussed instructions per endocrinologist visit on 04/08/22. reviewed upcoming/scheduled appointments]  Durable Medical Equipment (DME) Glucomoter  [has dexcome 6-denies any issues or concerns.]  Education Interventions   Education Provided Provided Education  Provided Verbal Education On When to see the doctor, Eye Care  [reviewed instructions per endocrinologist for office visit 04/08/22 encouragaed to contact provider BS consistently <70 or >300. discussed annual eye exam. patient reports eye exam completed last month.]            SDOH assessments and interventions completed:  Yes  SDOH Interventions Today    Flowsheet Row Most Recent Value  SDOH Interventions   Food Insecurity Interventions Intervention Not Indicated  Housing Interventions Intervention Not Indicated  Transportation Interventions Intervention Not Indicated  Utilities  Interventions Intervention Not Indicated     Care Coordination Interventions:  Yes, provided   Follow up plan: No further intervention required.   Encounter Outcome:  Pt. Visit Completed   Thea Silversmith, RN, MSN, BSN, [place] Coordinator [phone]

## 2022-04-21 DIAGNOSIS — E1065 Type 1 diabetes mellitus with hyperglycemia: Secondary | ICD-10-CM | POA: Diagnosis not present

## 2022-04-22 DIAGNOSIS — F5105 Insomnia due to other mental disorder: Secondary | ICD-10-CM | POA: Diagnosis not present

## 2022-04-22 DIAGNOSIS — F3189 Other bipolar disorder: Secondary | ICD-10-CM | POA: Diagnosis not present

## 2022-05-04 ENCOUNTER — Other Ambulatory Visit: Payer: Self-pay | Admitting: Internal Medicine

## 2022-05-06 DIAGNOSIS — E1151 Type 2 diabetes mellitus with diabetic peripheral angiopathy without gangrene: Secondary | ICD-10-CM | POA: Diagnosis not present

## 2022-05-06 DIAGNOSIS — M21962 Unspecified acquired deformity of left lower leg: Secondary | ICD-10-CM | POA: Diagnosis not present

## 2022-05-06 DIAGNOSIS — M21961 Unspecified acquired deformity of right lower leg: Secondary | ICD-10-CM | POA: Diagnosis not present

## 2022-05-06 DIAGNOSIS — M792 Neuralgia and neuritis, unspecified: Secondary | ICD-10-CM | POA: Diagnosis not present

## 2022-05-15 ENCOUNTER — Telehealth: Payer: Self-pay | Admitting: Internal Medicine

## 2022-05-15 MED ORDER — TRAMADOL HCL 50 MG PO TABS: ORAL_TABLET | ORAL | 2 refills | Status: DC

## 2022-05-15 NOTE — Telephone Encounter (Signed)
Done erx 

## 2022-05-15 NOTE — Telephone Encounter (Signed)
Caller & Relationship to patient: Zabrina - Patient  Call back number: [phone]  Date of last office visit: 11-06-21  Date of next office visit: None scheduled  Medication(s) to be refilled:  traMADol (ULTRAM) 50 MG tablet    Preferred Pharmacy: [facility] [place], [address] AT [place]

## 2022-06-03 ENCOUNTER — Ambulatory Visit: Payer: Medicare PPO

## 2022-06-04 DIAGNOSIS — F5105 Insomnia due to other mental disorder: Secondary | ICD-10-CM | POA: Diagnosis not present

## 2022-06-04 DIAGNOSIS — F3189 Other bipolar disorder: Secondary | ICD-10-CM | POA: Diagnosis not present

## 2022-06-05 ENCOUNTER — Ambulatory Visit: Payer: Medicare PPO | Attending: Internal Medicine

## 2022-06-05 DIAGNOSIS — Z131 Encounter for screening for diabetes mellitus: Secondary | ICD-10-CM

## 2022-06-05 DIAGNOSIS — Z79899 Other long term (current) drug therapy: Secondary | ICD-10-CM

## 2022-06-05 DIAGNOSIS — E785 Hyperlipidemia, unspecified: Secondary | ICD-10-CM | POA: Diagnosis not present

## 2022-06-06 LAB — HEPATIC FUNCTION PANEL
ALT: 17 IU/L (ref 0–32)
AST: 21 IU/L (ref 0–40)
Albumin: 4.1 g/dL (ref 3.9–4.9)
Alkaline Phosphatase: 82 IU/L (ref 44–121)
Bilirubin Total: 0.4 mg/dL (ref 0.0–1.2)
Bilirubin, Direct: 0.13 mg/dL (ref 0.00–0.40)
Total Protein: 6.3 g/dL (ref 6.0–8.5)

## 2022-06-06 LAB — LIPID PANEL
Chol/HDL Ratio: 2.4 ratio (ref 0.0–4.4)
Cholesterol, Total: 131 mg/dL (ref 100–199)
HDL: 55 mg/dL (ref >39)
LDL Chol Calc (NIH): 64 mg/dL (ref 0–99)
Triglycerides: 52 mg/dL (ref 0–149)
VLDL Cholesterol Cal: 12 mg/dL (ref 5–40)

## 2022-06-09 DIAGNOSIS — E1065 Type 1 diabetes mellitus with hyperglycemia: Secondary | ICD-10-CM | POA: Diagnosis not present

## 2022-06-09 DIAGNOSIS — E1040 Type 1 diabetes mellitus with diabetic neuropathy, unspecified: Secondary | ICD-10-CM | POA: Diagnosis not present

## 2022-06-09 DIAGNOSIS — E103299 Type 1 diabetes mellitus with mild nonproliferative diabetic retinopathy without macular edema, unspecified eye: Secondary | ICD-10-CM | POA: Diagnosis not present

## 2022-07-14 DIAGNOSIS — H6013 Cellulitis of external ear, bilateral: Secondary | ICD-10-CM | POA: Diagnosis not present

## 2022-07-15 DIAGNOSIS — F3189 Other bipolar disorder: Secondary | ICD-10-CM | POA: Diagnosis not present

## 2022-07-15 DIAGNOSIS — F5105 Insomnia due to other mental disorder: Secondary | ICD-10-CM | POA: Diagnosis not present

## 2022-07-20 DIAGNOSIS — E1065 Type 1 diabetes mellitus with hyperglycemia: Secondary | ICD-10-CM | POA: Diagnosis not present

## 2022-08-04 ENCOUNTER — Ambulatory Visit (INDEPENDENT_AMBULATORY_CARE_PROVIDER_SITE_OTHER): Payer: Medicare PPO | Admitting: Internal Medicine

## 2022-08-04 DIAGNOSIS — Z0000 Encounter for general adult medical examination without abnormal findings: Secondary | ICD-10-CM

## 2022-08-04 NOTE — Patient Instructions (Signed)

## 2022-08-04 NOTE — Progress Notes (Signed)
Subjective:   Lynn Barnes is a 62 y.o. female who presents for Medicare Annual (Subsequent) preventive examination.  Visit Complete: Virtual  I connected with  [name] on 08/04/22 by a audio enabled telemedicine application and verified that I am speaking with the correct person using two identifiers.  Patient Location: Home  Provider Location: Home Office  I discussed the limitations of evaluation and management by telemedicine. The patient expressed understanding and agreed to proceed.  Patient Medicare AWV questionnaire was completed by the patient on 08/04/2022; I have confirmed that all information answered by patient is correct and no changes since this date.       Objective:    There were no vitals filed for this visit. There is no height or weight on file to calculate BMI.     08/22/2021   11:32 AM 08/21/2020    2:10 PM 06/10/2019   12:16 PM 05/06/2019    3:46 PM 03/23/2018    2:38 PM 08/21/2014    2:57 PM  Advanced Directives  Does Patient Have a Medical Advance Directive? Yes Yes No No Yes Yes  Type of Advance Directive Living will;Healthcare Power of Attorney Living will;Healthcare Power of [employer] Power of [place];Living will Healthcare Power of [place];Living will  Does patient want to make changes to medical advance directive? No - Patient declined No - Patient declined      Copy of Healthcare Power of Attorney in Chart? No - copy requested No - copy requested   No - copy requested No - copy requested  Would patient like information on creating a medical advance directive?    No - Patient declined      Current Medications (verified) Outpatient Encounter Medications as of 08/04/2022  Medication Sig   atorvastatin (LIPITOR) 40 MG tablet TAKE 1 TABLET(40 MG) BY MOUTH DAILY   busPIRone (BUSPAR) 10 MG tablet Take 20 mg by mouth 2 (two) times daily.   carbidopa-levodopa (SINEMET CR) 50-200 MG tablet TAKE 1 TABLET BY MOUTH TWICE DAILY.    ezetimibe (ZETIA) 10 MG tablet Take 1 tablet (10 mg total) by mouth daily.   fludrocortisone (FLORINEF) 0.1 MG tablet TAKE 1/2 TABLET(0.05 MG) BY MOUTH TWICE DAILY   gabapentin (NEURONTIN) 400 MG capsule TAKE 1 CAPSULE BY MOUTH TWICE DAILY   Glucagon 3 MG/DOSE POWD Place into the nose.   glucose blood test strip 1 each by Other route. 7 times per day   - as instructed   HUMALOG KWIKPEN 100 UNIT/ML KwikPen INJECT BEFORE EACH MEAL WITH 1 UNIT PER 10 GM CARBS PLUS CORRECTIN OF 1 UNIT PER 50 MG   insulin degludec (TRESIBA) 100 UNIT/ML FlexTouch Pen Inject 27 Units into the skin daily.    LATUDA 120 MG TABS Take 1 tablet by mouth at bedtime.   pindolol (VISKEN) 5 MG tablet TAKE 1/2 TABLET(2.5 MG) BY MOUTH TWICE DAILY   POTASSIUM CITRATE PO Take 1 capsule by mouth every other day.   risperidone (RISPERDAL) 4 MG tablet Take 8 mg by mouth at bedtime.   scopolamine (TRANSDERM SCOP, 1.5 MG,) 1 MG/3DAYS Place 1 patch (1.5 mg total) onto the skin every 3 (three) days.   traMADol (ULTRAM) 50 MG tablet TAKE 1 TABLET(50 MG) BY MOUTH EVERY 6 HOURS AS NEEDED FOR MODERATE PAIN   trihexyphenidyl (ARTANE) 2 MG tablet Take 1 tablet (2 mg total) by mouth 3 (three) times daily with meals.   zolpidem (AMBIEN) 10 MG tablet Take 10 mg by  mouth at bedtime.   No facility-administered encounter medications on file as of 08/04/2022.    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)    Bipolar disorder (HCC) 02/15/2011   Cervical spondylosis without myelopathy    Depression    Depression    Diabetes mellitus    sees Dr Altheimer/endo   Dysautonomia (HCC) 10/24/2011   Dyskinesia     subacute,due to drugs   Dyslipidemia    History of nuclear stress test    Myoview 11/16:  EF 81%, no ischemia; Low Risk   Hyperlipidemia    IBS (irritable bowel syndrome) 02/15/2011   Insomnia 02/21/2011   Multinodular thyroid    with large dominant solid nodule with calcifications in the lower left pole benign by FNA in 1/12 and stable by repeat ulltrasound in 4/13   Neuropathy    with paresthesias in feet   Orthostatic hypotension    Orthostatic hypotension    Parkinsonism    due to prolonged prozac/risperdl for yrs/Dr Dohmeir   Parkinsonism    POTS (postural orthostatic tachycardia syndrome)    Radiculopathy    cervical   Resting tremor    Rotator cuff syndrome of left shoulder    S/P partial hysterectomy    Spondylosis without myelopathy    Subacute dyskinesia due to drugs(333.85)    dr dohmeier/neuro   Tachycardia    syndrome   Therapy    on therapy by psychiatrist using sebutex    Thyroid nodule    s/p biopsy - benign approx jan 2012   Tremor    Past Surgical History:  Procedure Laterality Date   ACROMIOPLASTY Left 2002   Dr Darrelyn Hillock   BREAST EXCISIONAL BIOPSY Right    benign   BREAST LUMPECTOMY Right    herniated disc cervical spine     lower back  1992   OOPHORECTOMY     PARTIAL HYSTERECTOMY     SHOULDER SURGERY Left    SPINE SURGERY  feb 2012   c5-6 fusion, Dr Shon Baton   THYROID LOBECTOMY  11/11   nodule biopsy, benign   Family History  Problem Relation Age of Onset   Lung cancer Mother    Heart disease Father    Hypertension Father    Pneumonia Father    Heart disease Paternal Grandmother    ALS Paternal Grandmother    Hypertension Sister    Social History   Socioeconomic History   Marital status: Married    Spouse name: Not on file   Number of children: 0   Years of education: BA   Highest education level: Not on file  Occupational History   Occupation: TEACHER    Employer: [employer]  Tobacco Use   Smoking status: Never   Smokeless tobacco: Never  Vaping Use   Vaping Use: Never used  Substance and Sexual Activity    Alcohol use: No    Alcohol/week: 0.0 standard drinks of alcohol   Drug use: No   Sexual activity: Never  Other Topics Concern   Not on file  Social History Narrative   Patient is single and lives with my partner Drinda Butts [place]).   Patient is disabled.   Patient is right-handed.  Patient has a Probation officer.   Patient drinks one soda every other day.   Social Determinants of Health   Financial Resource Strain: Low Risk  (08/21/2020)   Overall Financial Resource Strain (CARDIA)    Difficulty of Paying Living Expenses: Not hard at all  Food Insecurity: No Food Insecurity (04/10/2022)   Hunger Vital Sign    Worried About Running Out of Food in the Last Year: Never true    Ran Out of Food in the Last Year: Never true  Transportation Needs: No Transportation Needs (04/10/2022)   PRAPARE - Administrator, Civil Service (Medical): No    Lack of Transportation (Non-Medical): No  Physical Activity: Sufficiently Active (08/21/2020)   Exercise Vital Sign    Days of Exercise per Week: 3 days    Minutes of Exercise per Session: 60 min  Stress: No Stress Concern Present (08/21/2020)   [employer] of Occupational Health - Occupational Stress Questionnaire    Feeling of Stress : Not at all  Social Connections: Moderately Isolated (08/21/2020)   Social Connection and Isolation Panel [NHANES]    Frequency of Communication with Friends and Family: More than three times a week    Frequency of Social Gatherings with Friends and Family: More than three times a week    Attends Religious Services: Never    Database administrator or Organizations: No    Attends Banker Meetings: Never    Marital Status: Married    Tobacco Counseling Counseling given: Not Answered   Clinical Intake:                        Activities of Daily Living    08/22/2021   11:36 AM  In your present state of health, do you have any difficulty performing the following  activities:  Hearing? 0  Vision? 0  Difficulty concentrating or making decisions? 0  Walking or climbing stairs? 0  Dressing or bathing? 0  Doing errands, shopping? 0  Preparing Food and eating ? N  Using the Toilet? N  In the past six months, have you accidently leaked urine? N  Do you have problems with loss of bowel control? N  Managing your Medications? N  Managing your Finances? N  Housekeeping or managing your Housekeeping? N    Patient Care Team: Corwin Levins, MD as PCP - General (Internal Medicine) Pricilla Riffle, MD as PCP - Cardiology (Cardiology) Dohmeier, Porfirio Mylar, MD as Consulting Physician (Neurology) Altheimer, Casimiro Needle, MD as Referring Physician (Endocrinology) Albertina Parr (Nurse Practitioner) Mateo Flow, MD as Consulting Physician (Ophthalmology)  Indicate any recent Medical Services you may have received from other than [facility] providers in the past year (date may be approximate).     Assessment:   This is a routine wellness examination for [employer].  Hearing/Vision screen No results found.  Dietary issues and exercise activities discussed:     Goals Addressed   None   Depression Screen    11/06/2021    1:27 PM 11/06/2021    1:18 PM 08/22/2021   11:35 AM 10/16/2020    2:09 PM 10/16/2020    1:52 PM 09/07/2020    2:46 PM 08/21/2020    2:23 PM  PHQ 2/9 Scores  PHQ - 2 Score 1 2 1  0 0 0 0  PHQ- 9 Score  10         Fall Risk    11/06/2021    1:27 PM  11/06/2021    1:18 PM 08/22/2021   11:33 AM 10/16/2020    2:09 PM 10/16/2020    1:52 PM  Fall Risk   Falls in the past year? 0 0 0 0 0  Number falls in past yr: 0 0 0 0 0  Injury with Fall? 0 0 0 0 0  Risk for fall due to :  No Fall Risks No Fall Risks    Follow up  Falls evaluation completed Falls evaluation completed      MEDICARE RISK AT HOME:   TIMED UP AND GO:  Was the test performed?  No    Cognitive Function:    08/21/2014    3:26 PM  MMSE - Mini Mental State Exam  Orientation to time  5  Orientation to Place 5  Registration 3  Attention/ Calculation 5  Recall 3  Language- name 2 objects 2  Language- repeat 1  Language- follow 3 step command 2  Language- read & follow direction 1  Write a sentence 1  Copy design 1  Total score 29      04/02/2016    3:45 PM 02/21/2015    3:37 PM  Montreal Cognitive Assessment   Visuospatial/ Executive (0/5) 5 5  Naming (0/3) 3 3  Attention: Read list of digits (0/2) 2 2  Attention: Read list of letters (0/1) 1 1  Attention: Serial 7 subtraction starting at 100 (0/3) 3 3  Language: Repeat phrase (0/2) 2 2  Language : Fluency (0/1) 0 0  Abstraction (0/2) 2 2  Delayed Recall (0/5) 2 3  Orientation (0/6) 5 5  Total 25 26  Adjusted Score (based on education) 25 26      08/22/2021   11:39 AM 05/06/2019    3:57 PM  6CIT Screen  What Year? 0 points 0 points  What month? 0 points 0 points  What time? 0 points 0 points  Count back from 20 0 points 0 points  Months in reverse 0 points 0 points  Repeat phrase 0 points 0 points  Total Score 0 points 0 points    Immunizations Immunization History  Administered Date(s) Administered   Influenza Inj Mdck Quad Pf 10/29/2018, 10/29/2018   Influenza Whole 11/11/2007, 04/24/2008   Influenza, Seasonal, Injecte, Preservative Fre 11/10/2012   Influenza,inj,Quad PF,6+ Mos 09/27/2013, 10/11/2019, 10/16/2020, 11/06/2021   Influenza-Unspecified 11/13/2017, 10/29/2018   Moderna Sars-Covid-2 Vaccination 05/09/2019, 06/11/2019, 11/11/2019, 08/31/2020   Pneumococcal Conjugate-13 08/21/2016   Pneumococcal Polysaccharide-23 08/24/2017    TDAP status: Up to date  Flu Vaccine status: Up to date  Pneumococcal vaccine status: Up to date  Covid-19 vaccine status: Completed vaccines  Qualifies for Shingles Vaccine? Yes   Zostavax completed No   Shingrix Completed?: No.    Education has been provided regarding the importance of this vaccine. Patient has been advised to call insurance company  to determine out of pocket expense if they have not yet received this vaccine. Advised may also receive vaccine at local pharmacy or Health Dept. Verbalized acceptance and understanding.  Screening Tests Health Maintenance  Topic Date Due   DTaP/Tdap/Td (1 - Tdap) Never done   Zoster Vaccines- Shingrix (1 of 2) Never done   PAP SMEAR-Modifier  09/05/2008   Colonoscopy  05/14/2016   COVID-19 Vaccine (5 - 2023-24 season) 10/11/2021   Diabetic kidney evaluation - Urine ACR  10/16/2021   INFLUENZA VACCINE  09/11/2022   HEMOGLOBIN A1C  09/26/2022   Diabetic kidney evaluation - eGFR measurement  11/06/2022  FOOT EXAM  11/07/2022   OPHTHALMOLOGY EXAM  02/15/2023   Medicare Annual Wellness (AWV)  08/04/2023   MAMMOGRAM  01/07/2024   Hepatitis C Screening  Completed   HIV Screening  Completed   HPV VACCINES  Aged Out    Health Maintenance  Health Maintenance Due  Topic Date Due   DTaP/Tdap/Td (1 - Tdap) Never done   Zoster Vaccines- Shingrix (1 of 2) Never done   PAP SMEAR-Modifier  09/05/2008   Colonoscopy  05/14/2016   COVID-19 Vaccine (5 - 2023-24 season) 10/11/2021   Diabetic kidney evaluation - Urine ACR  10/16/2021    Colorectal cancer screening: Type of screening: Colonoscopy. Completed 05/14/2016. Repeat every 010 years  Mammogram status: Completed MARCH 2024. Repeat every year  Bone Density status: Ordered DECLINES AT THIS TIME. Pt provided with contact info and advised to call to schedule appt.  Lung Cancer Screening: (Low Dose CT Chest recommended if Age 36-80 years, 20 pack-year currently smoking OR have quit w/in 15years.) does not qualify.   Lung Cancer Screening Referral: NA  Additional Screening:  Hepatitis C Screening: does qualify; Completed YES  Vision Screening: Recommended annual ophthalmology exams for early detection of glaucoma and other disorders of the eye. Is the patient up to date with their annual eye exam?  Yes  Who is the provider or what is  the name of the office in which the patient attends annual eye exams? [facility] If pt is not established with a provider, would they like to be referred to a provider to establish care?  [facility] .   Dental Screening: Recommended annual dental exams for proper oral hygiene  Diabetic Foot Exam: Diabetic Foot Exam: Completed    Community Resource Referral / Chronic Care Management: CRR required this visit?  No   CCM required this visit?  No     Plan:     I have personally reviewed and noted the following in the patient's chart:   Medical and social history Use of alcohol, tobacco or illicit drugs  Current medications and supplements including opioid prescriptions. Patient is currently taking opioid prescriptions. Information provided to patient regarding non-opioid alternatives. Patient advised to discuss non-opioid treatment plan with their provider. Functional ability and status Nutritional status Physical activity Advanced directives List of other physicians Hospitalizations, surgeries, and ER visits in previous 12 months Vitals Screenings to include cognitive, depression, and falls Referrals and appointments  In addition, I have reviewed and discussed with patient certain preventive protocols, quality metrics, and best practice recommendations. A written personalized care plan for preventive services as well as general preventive health recommendations were provided to patient.     Delana Meyer   08/04/2022   After Visit Summary: (MyChart) Due to this being a telephonic visit, the after visit summary with patients personalized plan was offered to patient via MyChart   Nurse Notes:

## 2022-08-06 DIAGNOSIS — F3189 Other bipolar disorder: Secondary | ICD-10-CM | POA: Diagnosis not present

## 2022-08-06 DIAGNOSIS — F5105 Insomnia due to other mental disorder: Secondary | ICD-10-CM | POA: Diagnosis not present

## 2022-08-06 NOTE — Addendum Note (Signed)
Addended by: Nelda Bucks on: 08/06/2022 10:02 AM   Modules accepted: Level of Service

## 2022-08-07 NOTE — Progress Notes (Signed)
Subjective:   Lynn Barnes is a 62 y.o. female who presents for Medicare Annual (Subsequent) preventive examination.  Visit Complete: Virtual  I connected with  [name] on 08/04/2022 by a audio enabled telemedicine application and verified that I am speaking with the correct person using two identifiers.  Patient Location: Home  Provider Location: Home Office  I discussed the limitations of evaluation and management by telemedicine. The patient expressed understanding and agreed to proceed.  Patient Medicare AWV questionnaire was completed by the patient on 08/04/2022; I have confirmed that all information answered by patient is correct and no changes since this date.       Objective:    Today's Vitals   There is no height or weight on file to calculate BMI.     08/04/2022   12:46 PM 08/22/2021   11:32 AM 08/21/2020    2:10 PM 06/10/2019   12:16 PM 05/06/2019    3:46 PM 03/23/2018    2:38 PM 08/21/2014    2:57 PM  Advanced Directives  Does Patient Have a Medical Advance Directive? No Yes Yes No No Yes Yes  Type of Advance Directive  Living will;Healthcare Power of Attorney Living will;Healthcare Power of [employer] Power of [place];Living will Healthcare Power of [place];Living will  Does patient want to make changes to medical advance directive?  No - Patient declined No - Patient declined      Copy of Healthcare Power of Attorney in Chart?  No - copy requested No - copy requested   No - copy requested No - copy requested  Would patient like information on creating a medical advance directive? Yes (MAU/Ambulatory/Procedural Areas - Information given)    No - Patient declined      Current Medications (verified) Outpatient Encounter Medications as of 08/04/2022  Medication Sig   atorvastatin (LIPITOR) 40 MG tablet TAKE 1 TABLET(40 MG) BY MOUTH DAILY   busPIRone (BUSPAR) 10 MG tablet Take 20 mg by mouth 2 (two) times daily.   carbidopa-levodopa (SINEMET  CR) 50-200 MG tablet TAKE 1 TABLET BY MOUTH TWICE DAILY.   ezetimibe (ZETIA) 10 MG tablet Take 1 tablet (10 mg total) by mouth daily.   fludrocortisone (FLORINEF) 0.1 MG tablet TAKE 1/2 TABLET(0.05 MG) BY MOUTH TWICE DAILY   gabapentin (NEURONTIN) 400 MG capsule TAKE 1 CAPSULE BY MOUTH TWICE DAILY   Glucagon 3 MG/DOSE POWD Place into the nose.   glucose blood test strip 1 each by Other route. 7 times per day   - as instructed   HUMALOG KWIKPEN 100 UNIT/ML KwikPen INJECT BEFORE EACH MEAL WITH 1 UNIT PER 10 GM CARBS PLUS CORRECTIN OF 1 UNIT PER 50 MG   insulin degludec (TRESIBA) 100 UNIT/ML FlexTouch Pen Inject 27 Units into the skin daily.    LATUDA 120 MG TABS Take 1 tablet by mouth at bedtime.   pindolol (VISKEN) 5 MG tablet TAKE 1/2 TABLET(2.5 MG) BY MOUTH TWICE DAILY   POTASSIUM CITRATE PO Take 1 capsule by mouth every other day.   risperidone (RISPERDAL) 4 MG tablet Take 8 mg by mouth at bedtime.   traMADol (ULTRAM) 50 MG tablet TAKE 1 TABLET(50 MG) BY MOUTH EVERY 6 HOURS AS NEEDED FOR MODERATE PAIN   trihexyphenidyl (ARTANE) 2 MG tablet Take 1 tablet (2 mg total) by mouth 3 (three) times daily with meals.   zolpidem (AMBIEN) 10 MG tablet Take 10 mg by mouth at bedtime.   scopolamine (TRANSDERM SCOP, 1.5 MG,) 1  MG/3DAYS Place 1 patch (1.5 mg total) onto the skin every 3 (three) days. (Patient not taking: Reported on 08/04/2022)   No facility-administered encounter medications on file as of 08/04/2022.    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)    Bipolar disorder (HCC) 02/15/2011   Cervical spondylosis without myelopathy    Depression    Depression    Diabetes mellitus    sees Dr Altheimer/endo   Dysautonomia (HCC) 10/24/2011    Dyskinesia     subacute,due to drugs   Dyslipidemia    History of nuclear stress test    Myoview 11/16: EF 81%, no ischemia; Low Risk   Hyperlipidemia    IBS (irritable bowel syndrome) 02/15/2011   Insomnia 02/21/2011   Multinodular thyroid    with large dominant solid nodule with calcifications in the lower left pole benign by FNA in 1/12 and stable by repeat ulltrasound in 4/13   Neuropathy    with paresthesias in feet   Orthostatic hypotension    Orthostatic hypotension    Parkinsonism    due to prolonged prozac/risperdl for yrs/Dr Dohmeir   Parkinsonism    POTS (postural orthostatic tachycardia syndrome)    Radiculopathy    cervical   Resting tremor    Rotator cuff syndrome of left shoulder    S/P partial hysterectomy    Spondylosis without myelopathy    Subacute dyskinesia due to drugs(333.85)    dr dohmeier/neuro   Tachycardia    syndrome   Therapy    on therapy by psychiatrist using sebutex    Thyroid nodule    s/p biopsy - benign approx jan 2012   Tremor    Past Surgical History:  Procedure Laterality Date   ACROMIOPLASTY Left 2002   Dr Darrelyn Hillock   BREAST EXCISIONAL BIOPSY Right    benign   BREAST LUMPECTOMY Right    herniated disc cervical spine     lower back  1992   OOPHORECTOMY     PARTIAL HYSTERECTOMY     SHOULDER SURGERY Left    SPINE SURGERY  feb 2012   c5-6 fusion, Dr Shon Baton   THYROID LOBECTOMY  11/11   nodule biopsy, benign   Family History  Problem Relation Age of Onset   Lung cancer Mother    Heart disease Father    Hypertension Father    Pneumonia Father    Heart disease Paternal Grandmother    ALS Paternal Grandmother    Hypertension Sister    Social History   Socioeconomic History   Marital status: Married    Spouse name: Not on file   Number of children: 0   Years of education: BA   Highest education level: Not on file  Occupational History   Occupation: TEACHER    Employer: [employer]  Tobacco Use   Smoking  status: Never   Smokeless tobacco: Never  Vaping Use   Vaping Use: Never used  Substance and Sexual Activity   Alcohol use: No    Alcohol/week: 0.0 standard drinks of alcohol   Drug use: No   Sexual activity: Never  Other Topics Concern   Not on file  Social History Narrative   Patient is single and  lives with my partner Drinda Butts [place]).   Patient is disabled.   Patient is right-handed.   Patient has a Probation officer.   Patient drinks one soda every other day.   Social Determinants of Health   Financial Resource Strain: Low Risk  (08/04/2022)   Overall Financial Resource Strain (CARDIA)    Difficulty of Paying Living Expenses: Not hard at all  Food Insecurity: No Food Insecurity (08/04/2022)   Hunger Vital Sign    Worried About Running Out of Food in the Last Year: Never true    Ran Out of Food in the Last Year: Never true  Transportation Needs: No Transportation Needs (08/04/2022)   PRAPARE - Administrator, Civil Service (Medical): No    Lack of Transportation (Non-Medical): No  Physical Activity: Sufficiently Active (08/04/2022)   Exercise Vital Sign    Days of Exercise per Week: 3 days    Minutes of Exercise per Session: 60 min  Stress: No Stress Concern Present (08/04/2022)   [employer] of Occupational Health - Occupational Stress Questionnaire    Feeling of Stress : Only a little  Social Connections: Moderately Integrated (08/04/2022)   Social Connection and Isolation Panel [NHANES]    Frequency of Communication with Friends and Family: More than three times a week    Frequency of Social Gatherings with Friends and Family: Twice a week    Attends Religious Services: Never    Database administrator or Organizations: Yes    Attends Banker Meetings: Never    Marital Status: Married    Tobacco Counseling Counseling given: Not Answered   Clinical Intake:  Pre-visit preparation completed: Yes  Pain : No/denies pain     Diabetes:  Yes CBG done?: No Did pt. bring in CBG monitor from home?: No  How often do you need to have someone help you when you read instructions, pamphlets, or other written materials from your doctor or pharmacy?: 1 - Never What is the last grade level you completed in school?: GRAD SCHOOL  Interpreter Needed?: No      Activities of Daily Living    08/04/2022   12:40 PM 08/22/2021   11:36 AM  In your present state of health, do you have any difficulty performing the following activities:  Hearing? 0 0  Vision? 0 0  Difficulty concentrating or making decisions? 1 0  Walking or climbing stairs? 1 0  Dressing or bathing? 0 0  Doing errands, shopping? 0 0  Preparing Food and eating ? N N  Using the Toilet? N N  In the past six months, have you accidently leaked urine? N N  Do you have problems with loss of bowel control? N N  Managing your Medications? N N  Managing your Finances? N N  Housekeeping or managing your Housekeeping? N N    Patient Care Team: Corwin Levins, MD as PCP - General (Internal Medicine) Pricilla Riffle, MD as PCP - Cardiology (Cardiology) Dohmeier, Porfirio Mylar, MD as Consulting Physician (Neurology) Altheimer, Casimiro Needle, MD as Referring Physician (Endocrinology) Albertina Parr (Nurse Practitioner) Mateo Flow, MD as Consulting Physician (Ophthalmology)  Indicate any recent Medical Services you may have received from other than [facility] providers in the past year (date may be approximate).     Assessment:   This is a routine wellness examination for [place].  Hearing/Vision screen No results found.  Dietary issues and exercise activities discussed:     Goals Addressed  This Visit's Progress     Diabetes Patient stated goal (pt-stated)        Depression Screen    08/04/2022   12:45 PM 11/06/2021    1:27 PM 11/06/2021    1:18 PM 08/22/2021   11:35 AM 10/16/2020    2:09 PM 10/16/2020    1:52 PM 09/07/2020    2:46 PM  PHQ 2/9 Scores  PHQ -  2 Score 0 1 2 1  0 0 0  PHQ- 9 Score 0  10        Fall Risk    08/04/2022   12:47 PM 11/06/2021    1:27 PM 11/06/2021    1:18 PM 08/22/2021   11:33 AM 10/16/2020    2:09 PM  Fall Risk   Falls in the past year? 1 0 0 0 0  Number falls in past yr: 1 0 0 0 0  Injury with Fall? 0 0 0 0 0  Risk for fall due to : Impaired balance/gait  No Fall Risks No Fall Risks   Follow up Falls evaluation completed  Falls evaluation completed Falls evaluation completed     MEDICARE RISK AT HOME:   TIMED UP AND GO:  Was the test performed?  No    Cognitive Function:    08/21/2014    3:26 PM  MMSE - Mini Mental State Exam  Orientation to time 5  Orientation to Place 5  Registration 3  Attention/ Calculation 5  Recall 3  Language- name 2 objects 2  Language- repeat 1  Language- follow 3 step command 2  Language- read & follow direction 1  Write a sentence 1  Copy design 1  Total score 29      04/02/2016    3:45 PM 02/21/2015    3:37 PM  Montreal Cognitive Assessment   Visuospatial/ Executive (0/5) 5 5  Naming (0/3) 3 3  Attention: Read list of digits (0/2) 2 2  Attention: Read list of letters (0/1) 1 1  Attention: Serial 7 subtraction starting at 100 (0/3) 3 3  Language: Repeat phrase (0/2) 2 2  Language : Fluency (0/1) 0 0  Abstraction (0/2) 2 2  Delayed Recall (0/5) 2 3  Orientation (0/6) 5 5  Total 25 26  Adjusted Score (based on education) 25 26      08/04/2022   12:49 PM 08/22/2021   11:39 AM 05/06/2019    3:57 PM  6CIT Screen  What Year? 0 points 0 points 0 points  What month? 0 points 0 points 0 points  What time? 0 points 0 points 0 points  Count back from 20 0 points 0 points 0 points  Months in reverse 0 points 0 points 0 points  Repeat phrase 2 points 0 points 0 points  Total Score 2 points 0 points 0 points    Immunizations Immunization History  Administered Date(s) Administered   Influenza Inj Mdck Quad Pf 10/29/2018, 10/29/2018   Influenza Whole 11/11/2007,  04/24/2008   Influenza, Seasonal, Injecte, Preservative Fre 11/10/2012   Influenza,inj,Quad PF,6+ Mos 09/27/2013, 10/11/2019, 10/16/2020, 11/06/2021   Influenza-Unspecified 11/13/2017, 10/29/2018   Moderna Sars-Covid-2 Vaccination 05/09/2019, 06/11/2019, 11/11/2019, 08/31/2020   Pneumococcal Conjugate-13 08/21/2016   Pneumococcal Polysaccharide-23 08/24/2017    TDAP status: Up to date  Flu Vaccine status: Up to date  Pneumococcal vaccine status: Up to date  Covid-19 vaccine status: Completed vaccines  Qualifies for Shingles Vaccine? Yes   Zostavax completed No   Shingrix Completed?: No.  Education has been provided regarding the importance of this vaccine. Patient has been advised to call insurance company to determine out of pocket expense if they have not yet received this vaccine. Advised may also receive vaccine at local pharmacy or Health Dept. Verbalized acceptance and understanding.  Screening Tests Health Maintenance  Topic Date Due   DTaP/Tdap/Td (1 - Tdap) Never done   Zoster Vaccines- Shingrix (1 of 2) Never done   PAP SMEAR-Modifier  09/05/2008   Colonoscopy  05/14/2016   COVID-19 Vaccine (5 - 2023-24 season) 10/11/2021   Diabetic kidney evaluation - Urine ACR  10/16/2021   INFLUENZA VACCINE  09/11/2022   HEMOGLOBIN A1C  09/26/2022   Diabetic kidney evaluation - eGFR measurement  11/06/2022   FOOT EXAM  11/07/2022   OPHTHALMOLOGY EXAM  02/15/2023   Medicare Annual Wellness (AWV)  08/04/2023   MAMMOGRAM  01/07/2024   Hepatitis C Screening  Completed   HIV Screening  Completed   HPV VACCINES  Aged Out    Health Maintenance  Health Maintenance Due  Topic Date Due   DTaP/Tdap/Td (1 - Tdap) Never done   Zoster Vaccines- Shingrix (1 of 2) Never done   PAP SMEAR-Modifier  09/05/2008   Colonoscopy  05/14/2016   COVID-19 Vaccine (5 - 2023-24 season) 10/11/2021   Diabetic kidney evaluation - Urine ACR  10/16/2021    Colorectal cancer screening: Type of  screening: Colonoscopy. Completed 05/14/2016. Repeat every 010 years  Mammogram status: Completed MARCH 2024. Repeat every year  Bone Density status: Ordered DECLINES AT THIS TIME. Pt provided with contact info and advised to call to schedule appt.  Lung Cancer Screening: (Low Dose CT Chest recommended if Age 72-80 years, 20 pack-year currently smoking OR have quit w/in 15years.) does not qualify.   Lung Cancer Screening Referral: NA  Additional Screening:  Hepatitis C Screening: does qualify; Completed YES  Vision Screening: Recommended annual ophthalmology exams for early detection of glaucoma and other disorders of the eye. Is the patient up to date with their annual eye exam?  Yes  Who is the provider or what is the name of the office in which the patient attends annual eye exams? [facility] If pt is not established with a provider, would they like to be referred to a provider to establish care?  [facility] .   Dental Screening: Recommended annual dental exams for proper oral hygiene  Diabetic Foot Exam: Diabetic Foot Exam: Completed    Community Resource Referral / Chronic Care Management: CRR required this visit?  No   CCM required this visit?  No     Plan:     I have personally reviewed and noted the following in the patient's chart:   Medical and social history Use of alcohol, tobacco or illicit drugs  Current medications and supplements including opioid prescriptions. Patient is currently taking opioid prescriptions. Information provided to patient regarding non-opioid alternatives. Patient advised to discuss non-opioid treatment plan with their provider. Functional ability and status Nutritional status Physical activity Advanced directives List of other physicians Hospitalizations, surgeries, and ER visits in previous 12 months Vitals Screenings to include cognitive, depression, and falls Referrals and appointments  In addition, I have reviewed and discussed with  patient certain preventive protocols, quality metrics, and best practice recommendations. A written personalized care plan for preventive services as well as general preventive health recommendations were provided to patient.     Delana Meyer   08/04/2022   After Visit Summary: (MyChart) Due to this being a  telephonic visit, the after visit summary with patients personalized plan was offered to patient via MyChart   Nurse Notes:    Ms. Turck , Thank you for taking time to come for your Medicare Wellness Visit. I appreciate your ongoing commitment to your health goals. Please review the following plan we discussed and let me know if I can assist you in the future.   These are the goals we discussed:  Goals       Diabetes Patient stated goal (pt-stated)      My goal is to keep from fallen by being very careful.      Patient Stated      Increase the amount physical activity by continuing to walk and go to the gym once a week.      To get blood sugars under control (pt-stated)        This is a list of the screening recommended for you and due dates:  Health Maintenance  Topic Date Due   DTaP/Tdap/Td vaccine (1 - Tdap) Never done   Zoster (Shingles) Vaccine (1 of 2) Never done   Pap Smear  09/05/2008   Colon Cancer Screening  05/14/2016   COVID-19 Vaccine (5 - 2023-24 season) 10/11/2021   Yearly kidney health urinalysis for diabetes  10/16/2021   Flu Shot  09/11/2022   Hemoglobin A1C  09/26/2022   Yearly kidney function blood test for diabetes  11/06/2022   Complete foot exam   11/07/2022   Eye exam for diabetics  02/15/2023   Medicare Annual Wellness Visit  08/04/2023   Mammogram  01/07/2024   Hepatitis C Screening  Completed   HIV Screening  Completed   HPV Vaccine  Aged Out

## 2022-08-08 ENCOUNTER — Other Ambulatory Visit: Payer: Self-pay | Admitting: Internal Medicine

## 2022-10-01 ENCOUNTER — Ambulatory Visit: Payer: Medicare PPO | Admitting: Internal Medicine

## 2022-10-01 DIAGNOSIS — I10 Essential (primary) hypertension: Secondary | ICD-10-CM | POA: Diagnosis not present

## 2022-10-09 DIAGNOSIS — I10 Essential (primary) hypertension: Secondary | ICD-10-CM | POA: Diagnosis not present

## 2022-10-09 DIAGNOSIS — F3189 Other bipolar disorder: Secondary | ICD-10-CM | POA: Diagnosis not present

## 2022-10-09 DIAGNOSIS — F5105 Insomnia due to other mental disorder: Secondary | ICD-10-CM | POA: Diagnosis not present

## 2022-10-10 ENCOUNTER — Ambulatory Visit: Payer: Medicare PPO | Admitting: Internal Medicine

## 2022-10-10 ENCOUNTER — Encounter: Payer: Self-pay | Admitting: Internal Medicine

## 2022-10-10 VITALS — BP 132/80 | HR 75 | Temp 98.7°F | Ht 67.0 in | Wt 154.0 lb

## 2022-10-10 DIAGNOSIS — E1042 Type 1 diabetes mellitus with diabetic polyneuropathy: Secondary | ICD-10-CM

## 2022-10-10 DIAGNOSIS — E1043 Type 1 diabetes mellitus with diabetic autonomic (poly)neuropathy: Secondary | ICD-10-CM

## 2022-10-10 DIAGNOSIS — E7800 Pure hypercholesterolemia, unspecified: Secondary | ICD-10-CM

## 2022-10-10 DIAGNOSIS — E559 Vitamin D deficiency, unspecified: Secondary | ICD-10-CM

## 2022-10-10 DIAGNOSIS — I10 Essential (primary) hypertension: Secondary | ICD-10-CM

## 2022-10-10 DIAGNOSIS — E538 Deficiency of other specified B group vitamins: Secondary | ICD-10-CM

## 2022-10-10 DIAGNOSIS — Z23 Encounter for immunization: Secondary | ICD-10-CM | POA: Diagnosis not present

## 2022-10-10 DIAGNOSIS — M21969 Unspecified acquired deformity of unspecified lower leg: Secondary | ICD-10-CM | POA: Insufficient documentation

## 2022-10-10 LAB — URINALYSIS, ROUTINE W REFLEX MICROSCOPIC
Bilirubin Urine: NEGATIVE
Hgb urine dipstick: NEGATIVE
Ketones, ur: NEGATIVE
Leukocytes,Ua: NEGATIVE
Nitrite: NEGATIVE
Specific Gravity, Urine: 1.01 (ref 1.000–1.030)
Total Protein, Urine: NEGATIVE
Urine Glucose: NEGATIVE
Urobilinogen, UA: 0.2 (ref 0.0–1.0)
pH: 8 (ref 5.0–8.0)

## 2022-10-10 LAB — HEPATIC FUNCTION PANEL
ALT: 20 U/L (ref 0–35)
AST: 26 U/L (ref 0–37)
Albumin: 3.9 g/dL (ref 3.5–5.2)
Alkaline Phosphatase: 73 U/L (ref 39–117)
Bilirubin, Direct: 0.1 mg/dL (ref 0.0–0.3)
Total Bilirubin: 0.4 mg/dL (ref 0.2–1.2)
Total Protein: 6.5 g/dL (ref 6.0–8.3)

## 2022-10-10 LAB — MICROALBUMIN / CREATININE URINE RATIO
Creatinine,U: 23.5 mg/dL
Microalb Creat Ratio: 3 mg/g (ref 0.0–30.0)
Microalb, Ur: <0.7 mg/dL (ref 0.0–1.9)

## 2022-10-10 LAB — HEMOGLOBIN A1C: Hgb A1c MFr Bld: 7.9 % — ABNORMAL HIGH (ref 4.6–6.5)

## 2022-10-10 LAB — LIPID PANEL
Cholesterol: 126 mg/dL (ref 0–200)
HDL: 46.6 mg/dL (ref >39.00)
LDL Cholesterol: 66 mg/dL (ref 0–99)
NonHDL: 79.66
Total CHOL/HDL Ratio: 3
Triglycerides: 70 mg/dL (ref 0.0–149.0)
VLDL: 14 mg/dL (ref 0.0–40.0)

## 2022-10-10 LAB — CBC WITH DIFFERENTIAL/PLATELET
Basophils Absolute: 0 10*3/uL (ref 0.0–0.1)
Basophils Relative: 0.4 % (ref 0.0–3.0)
Eosinophils Absolute: 0 10*3/uL (ref 0.0–0.7)
Eosinophils Relative: 0.7 % (ref 0.0–5.0)
HCT: 39.8 % (ref 36.0–46.0)
Hemoglobin: 13.4 g/dL (ref 12.0–15.0)
Lymphocytes Relative: 30.9 % (ref 12.0–46.0)
Lymphs Abs: 2 10*3/uL (ref 0.7–4.0)
MCHC: 33.6 g/dL (ref 30.0–36.0)
MCV: 96.9 fl (ref 78.0–100.0)
Monocytes Absolute: 0.5 10*3/uL (ref 0.1–1.0)
Monocytes Relative: 7.7 % (ref 3.0–12.0)
Neutro Abs: 3.8 10*3/uL (ref 1.4–7.7)
Neutrophils Relative %: 60.3 % (ref 43.0–77.0)
Platelets: 315 10*3/uL (ref 150.0–400.0)
RBC: 4.11 Mil/uL (ref 3.87–5.11)
RDW: 11.9 % (ref 11.5–15.5)
WBC: 6.4 10*3/uL (ref 4.0–10.5)

## 2022-10-10 LAB — BASIC METABOLIC PANEL
BUN: 18 mg/dL (ref 6–23)
CO2: 28 mEq/L (ref 19–32)
Calcium: 9.3 mg/dL (ref 8.4–10.5)
Chloride: 98 mEq/L (ref 96–112)
Creatinine, Ser: 0.84 mg/dL (ref 0.40–1.20)
GFR: 74.18 mL/min (ref >60.00)
Glucose, Bld: 131 mg/dL — ABNORMAL HIGH (ref 70–99)
Potassium: 4.1 mEq/L (ref 3.5–5.1)
Sodium: 134 mEq/L — ABNORMAL LOW (ref 135–145)

## 2022-10-10 LAB — TSH: TSH: 1.42 u[IU]/mL (ref 0.35–5.50)

## 2022-10-10 LAB — VITAMIN D 25 HYDROXY (VIT D DEFICIENCY, FRACTURES): VITD: 59.07 ng/mL (ref 30.00–100.00)

## 2022-10-10 LAB — VITAMIN B12: Vitamin B-12: >1501 pg/mL — ABNORMAL HIGH (ref 211–911)

## 2022-10-10 MED ORDER — LISINOPRIL 10 MG PO TABS: 10.0000 mg | ORAL_TABLET | Freq: Every day | ORAL | 3 refills | Status: DC

## 2022-10-10 MED ORDER — TRAMADOL HCL 50 MG PO TABS: ORAL_TABLET | ORAL | 5 refills | Status: DC

## 2022-10-10 NOTE — Assessment & Plan Note (Signed)
Lab Results  Component Value Date   LDLCALC 64 06/05/2022   Stable, pt to continue current statin lipitor 40 mg every day, zetia 10 mg qd

## 2022-10-10 NOTE — Progress Notes (Signed)
Patient ID: [name], female   DOB: 1960-03-12, 62 y.o.   MRN: [id number]        Chief Complaint: follow up HTN, HLD and Type I DM, neuropathy       HPI:  Lynn Barnes is a 62 y.o. female here overall doing well; next appt with endo is mar 2025 so asking for routine f/u and lab today;  Pt denies chest pain, increased sob or doe, wheezing, orthopnea, PND, increased LE swelling, palpitations, dizziness or syncope.   Pt denies polydipsia, polyuria, or new focal neuro s/s.    Pt denies fever, wt loss, night sweats, loss of appetite, or other constitutional symptoms  Needs handicapped parking application signed.  Had colonoscopy earlier this yr, for f/u at 10 yrs per pt.  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)    Bipolar disorder (HCC) 02/15/2011   Cervical spondylosis without myelopathy    Depression    Depression    Diabetes mellitus    sees Dr Altheimer/endo   Dysautonomia (HCC) 10/24/2011   Dyskinesia     subacute,due to drugs   Dyslipidemia    History of nuclear stress test    Myoview 11/16: EF 81%, no ischemia; Low Risk   Hyperlipidemia    IBS (irritable bowel syndrome) 02/15/2011   Insomnia 02/21/2011   Multinodular thyroid    with large dominant solid nodule with calcifications in the lower left pole benign by FNA in 1/12 and stable by repeat ulltrasound in 4/13   Neuropathy    with paresthesias in feet   Orthostatic hypotension    Orthostatic hypotension    Parkinsonism    due to prolonged prozac/risperdl for yrs/Dr Dohmeir   Parkinsonism    POTS (postural orthostatic  tachycardia syndrome)    Radiculopathy    cervical   Resting tremor    Rotator cuff syndrome of left shoulder    S/P partial hysterectomy    Spondylosis without myelopathy    Subacute dyskinesia due to drugs(333.85)    dr dohmeier/neuro   Tachycardia    syndrome   Therapy    on therapy by psychiatrist using sebutex    Thyroid nodule    s/p biopsy - benign approx jan 2012   Tremor    Past Surgical History:  Procedure Laterality Date   ACROMIOPLASTY Left 2002   Dr Darrelyn Hillock   BREAST EXCISIONAL BIOPSY Right    benign   BREAST LUMPECTOMY Right    herniated disc cervical spine     lower back  1992   OOPHORECTOMY     PARTIAL HYSTERECTOMY     SHOULDER SURGERY Left    SPINE SURGERY  feb 2012   c5-6 fusion, Dr Shon Baton   THYROID LOBECTOMY  11/11   nodule biopsy, benign    reports that she has never smoked. She has never used smokeless tobacco. She reports that she does not drink alcohol and does  not use drugs. family history includes ALS in her paternal grandmother; Heart disease in her father and paternal grandmother; Hypertension in her father and sister; Lung cancer in her mother; Pneumonia in her father. Allergies  Allergen Reactions   Klonopin [Clonazepam] Other (See Comments)    Severe weakness   Nsaids Other (See Comments)    Irritates stomach   Sulfonamide Derivatives Other (See Comments)   Current Outpatient Medications on File Prior to Visit  Medication Sig Dispense Refill   atorvastatin (LIPITOR) 40 MG tablet TAKE 1 TABLET(40 MG) BY MOUTH DAILY 90 tablet 3   busPIRone (BUSPAR) 10 MG tablet Take 20 mg by mouth 2 (two) times daily.     carbidopa-levodopa (SINEMET CR) 50-200 MG tablet TAKE 1 TABLET BY MOUTH TWICE DAILY. 270 tablet 3   ezetimibe (ZETIA) 10 MG tablet Take 1 tablet (10 mg total) by mouth daily. 90 tablet 3   fludrocortisone (FLORINEF) 0.1 MG tablet TAKE 1/2 TABLET(0.05 MG) BY MOUTH TWICE DAILY 30 tablet 11   gabapentin (NEURONTIN) 400 MG capsule TAKE 1  CAPSULE BY MOUTH TWICE DAILY 60 capsule 3   Glucagon 3 MG/DOSE POWD Place into the nose.     glucose blood test strip 1 each by Other route. 7 times per day   - as instructed     HUMALOG KWIKPEN 100 UNIT/ML KwikPen INJECT BEFORE EACH MEAL WITH 1 UNIT PER 10 GM CARBS PLUS CORRECTIN OF 1 UNIT PER 50 MG     insulin degludec (TRESIBA) 100 UNIT/ML FlexTouch Pen Inject 27 Units into the skin daily.      LATUDA 120 MG TABS Take 1 tablet by mouth at bedtime.     pindolol (VISKEN) 5 MG tablet TAKE 1/2 TABLET(2.5 MG) BY MOUTH TWICE DAILY 90 tablet 3   POTASSIUM CITRATE PO Take 1 capsule by mouth every other day.     risperidone (RISPERDAL) 4 MG tablet Take 8 mg by mouth at bedtime.  0   scopolamine (TRANSDERM SCOP, 1.5 MG,) 1 MG/3DAYS Place 1 patch (1.5 mg total) onto the skin every 3 (three) days. 10 patch 12   trihexyphenidyl (ARTANE) 2 MG tablet Take 1 tablet (2 mg total) by mouth 3 (three) times daily with meals. 90 tablet 11   zolpidem (AMBIEN) 10 MG tablet Take 10 mg by mouth at bedtime.  1   No current facility-administered medications on file prior to visit.        ROS:  All others reviewed and negative.  Objective        PE:  BP 132/80 (BP Location: Right Arm, Patient Position: Sitting, Cuff Size: Normal)   Pulse 75   Temp 98.7 F (37.1 C) (Oral)   Ht 5\' 7"  (1.702 m)   Wt 154 lb (69.9 kg)   SpO2 99%   BMI 24.12 kg/m                 Constitutional: Pt appears in NAD               HENT: Head: NCAT.                Right Ear: External ear normal.                 Left Ear: External ear normal.                Eyes: . Pupils are equal, round, and reactive to light. Conjunctivae and EOM are normal  Nose: without d/c or deformity               Neck: Neck supple. Gross normal ROM               Cardiovascular: Normal rate and regular rhythm.                 Pulmonary/Chest: Effort normal and breath sounds without rales or wheezing.                Abd:  Soft, NT, ND, + BS, no  organomegaly               Neurological: Pt is alert. At baseline orientation, motor grossly intact               Skin: Skin is warm. No rashes, no other new lesions, LE edema - none               Psychiatric: Pt behavior is normal without agitation   Micro: none  Cardiac tracings I have personally interpreted today:  none  Pertinent Radiological findings (summarize): none   Lab Results  Component Value Date   WBC 7.7 11/05/2021   HGB 14.9 11/05/2021   HCT 42.0 11/05/2021   PLT 297 11/05/2021   GLUCOSE 371 (H) 11/05/2021   CHOL 131 06/05/2022   TRIG 52 06/05/2022   HDL 55 06/05/2022   LDLCALC 64 06/05/2022   ALT 17 06/05/2022   AST 21 06/05/2022   NA 136 11/05/2021   K 4.1 11/05/2021   CL 103 11/05/2021   CREATININE 0.87 11/05/2021   BUN 24 (H) 11/05/2021   CO2 23 11/05/2021   TSH 0.737 11/05/2021   HGBA1C 8.8 (H) 03/28/2022   MICROALBUR <0.7 10/16/2020   Assessment/Plan:  Lynn Barnes is a 62 y.o. White or Caucasian [1] female with  has a past medical history of 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 [facility]), Addiction, opium (HCC), Allergy, Anxiety, 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 [facility]), Bipolar disorder (HCC) (02/15/2011), Cervical spondylosis without myelopathy, Depression, Depression, Diabetes mellitus, Dysautonomia (HCC) (10/24/2011), Dyskinesia, Dyslipidemia, History of nuclear stress test, Hyperlipidemia, IBS (irritable bowel syndrome) (02/15/2011), Insomnia (02/21/2011), Multinodular thyroid, Neuropathy, Orthostatic hypotension, Orthostatic hypotension, Parkinsonism, Parkinsonism, POTS (postural orthostatic tachycardia syndrome), Radiculopathy, Resting tremor, Rotator cuff syndrome of left shoulder, S/P partial hysterectomy, Spondylosis without myelopathy, Subacute dyskinesia due to drugs(333.85), Tachycardia, Therapy, Thyroid nodule, and Tremor.  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) Ok for handicapped parking application signed, cont florinef  Type 1  diabetes mellitus (HCC) Lab Results  Component Value Date   HGBA1C 8.8 (H) 03/28/2022   Uncontrolled, goal A1c < 7, pt to continue current medical treatment humalog SSI, and tresiba 27 u qd   Hyperlipidemia Lab Results  Component Value Date   LDLCALC 64 06/05/2022   Stable, pt to continue current statin lipitor 40 mg every day, zetia 10 mg qd   HTN (hypertension) BP Readings from Last 3 Encounters:  10/10/22 132/80  03/28/22 110/72  11/14/21 134/74   Stable, pt to continue medical treatment lisinopril 10 mg qd  Followup: Return in about 6 months (around 04/10/2023).  Oliver Barre, MD 10/10/2022 3:22 PM [facility]

## 2022-10-10 NOTE — Assessment & Plan Note (Signed)
BP Readings from Last 3 Encounters:  10/10/22 132/80  03/28/22 110/72  11/14/21 134/74   Stable, pt to continue medical treatment lisinopril 10 mg qd

## 2022-10-10 NOTE — Assessment & Plan Note (Addendum)
Ok for handicapped parking application signed, cont florinef

## 2022-10-10 NOTE — Assessment & Plan Note (Signed)
Lab Results  Component Value Date   HGBA1C 8.8 (H) 03/28/2022   Uncontrolled, goal A1c < 7, pt to continue current medical treatment humalog SSI, and tresiba 27 u qd

## 2022-10-10 NOTE — Patient Instructions (Signed)
You had the flu shot today  Please continue all other medications as before, and refills have been done if requested.  Please have the pharmacy call with any other refills you may need.  Please continue your efforts at being more active, low cholesterol diet, and weight control.  Please keep your appointments with your specialists as you may have planned  Please go to the LAB at the blood drawing area for the tests to be done  You will be contacted by phone if any changes need to be made immediately.  Otherwise, you will receive a letter about your results with an explanation, but please check with MyChart first.  Please remember to sign up for MyChart if you have not done so, as this will be important to you in the future with finding out test results, communicating by private email, and scheduling acute appointments online when needed.  Please make an Appointment to return in 6 months, or sooner if needed 

## 2022-10-11 NOTE — Progress Notes (Signed)
The test results show that your current treatment is OK, as the tests are stable.  Please continue the same plan.  There is no other need for change of treatment or further evaluation based on these results, at this time.  thanks 

## 2022-10-14 NOTE — Progress Notes (Signed)
PATIENT: Lynn Barnes DOB: January 26, 1961  REASON FOR VISIT: follow up HISTORY FROM: patient  Chief Complaint  Patient presents with   Follow-up    Rm 18 alone Pt is well, reports her tremor has worsen since last visit. She also mentions she looses her train of thought easily.     HISTORY OF PRESENT ILLNESS: Today 10/14/22:  Lynn Barnes is a 62 y.o. female with a history of drug-induced parkinsonism. Returns today for follow-up.  Overall she feels that she is doing well.  She does notice at times she tends to lose her train of thought.  She feels that this is just age-related.  Reports that she has 2 sisters that report the same complaints.  She states that when she comes to the doctor's office her tremor seems to get worse.  She states that being here makes her very nervous.  She remains on Sinemet CR twice a day.  No new symptoms.   HISTORY:   11/14/21 Rv with Melvyn Novas, MD, Interval history:    Dr Marjory Lies saw her 02-2021 with normal EMG, neuropathic changes in lower extremity nerve conductions,  11-05-2021:  Resent Hospitalization for hyperglycemia. She is a DM type 1.  Has had labs indicating dehydration. She had felt well earlier that day, neither vomiting nor diarrhea nor excessive diaphoresis.  Ct normal, reviewed and discussed with patient here    Lynn Barnes has been started on a trihexyphenidyl medication to treat tardive movements and she is currently taking tablets of 2 mg total by mouth 3 times a day with meals.  My limited observational time today tells me that it does reduce her tremor.  She has remained on Ultram as needed on scopolamine as needed when she travels she is still on Risperdal at bedtime 8 mg pindolol half tablet which is 2.5 mg by mouth twice daily she is on Latuda 120 mg by mouth at bedtime, she is taking Humalog insulin and Tresiba, gabapentin has been reduced significantly to 400 mg twice daily Florinef for low blood pressure is at 1.1 mg  and she takes only half tablet by mouth twice daily she still takes carbidopa levodopa 50 over 200 mg 1 tablet by mouth twice daily and she is also on Lipitor 40 mg.   Provided through this office are Sinemet, Artane.     08/08/19: [name] is a 62 year old female with a history of drug-induced parkinsonism.  She returns today for follow-up.  She remains on Sinemet 1 tablet 3 times a day.  Reports that this controls her tremor.  Denies any significant changes with her gait or balance.  Reports that she did have a fall approximately 6 weeks ago but fortunately no injuries.  She denies falling frequently.  She does not use an assistive device when ambulating.  No trouble chewing or swallowing.  She returns today for an evaluation.  HISTORY  10/06/18 Rv with Melvyn Novas, MD, routine Rv.  No further falls, no panic attacks, anxiety reportedly well controlled.   DM type 1 childhood onset- with a dysautonomia and neuropathy. POTS followed by Dietrich Pates, MD  Tremors have responded very well to Sinemet( start in 2017)  and after she took a "break' from carbidopa levodopa she noted how severe the tremor was when untreated.  12-2017 DAT scan negative for primary parkinson's disease.    2-20202 Shawnie Dapper, NP  Lynn Barnes is a 62 y.o. female here today for follow up for parkinsonism thought to  be drug-induced.  She was started on Sinemet about 2 years ago.  Sinemet helps but she still have some trouble walking and a tremor. She feels tremor is worse with activity like writing or drinking out of a cup.  She is able to perform all ADLs.  She does feel that she still has some trouble walking.  She has not had any falls.  She does not use any assistive devices.  DaTscan in November did not reveal any primary Parkinson's disease.   She is seen every three months by her psychiatrist, Dr Coral Else.  She continues Latuda, Risperdal, Valium and Ambien prescribed.  She reports that she continues to struggle with some  anxiety but feels that she is stable overall.   She is seeing Dietrich Pates for POTS and reports that symptoms are controlled. She is taking Pindolol 5mg  BID.     REVIEW OF SYSTEMS: Out of a complete 14 system review of symptoms, the patient complains only of the following symptoms, and all other reviewed systems are negative.  See HPI  ALLERGIES: Allergies  Allergen Reactions   Klonopin [Clonazepam] Other (See Comments)    Severe weakness   Nsaids Other (See Comments)    Irritates stomach   Sulfonamide Derivatives Other (See Comments)    HOME MEDICATIONS: Outpatient Medications Prior to Visit  Medication Sig Dispense Refill   atorvastatin (LIPITOR) 40 MG tablet TAKE 1 TABLET(40 MG) BY MOUTH DAILY 90 tablet 3   busPIRone (BUSPAR) 10 MG tablet Take 20 mg by mouth 2 (two) times daily.     carbidopa-levodopa (SINEMET CR) 50-200 MG tablet TAKE 1 TABLET BY MOUTH TWICE DAILY. 270 tablet 3   ezetimibe (ZETIA) 10 MG tablet Take 1 tablet (10 mg total) by mouth daily. 90 tablet 3   fludrocortisone (FLORINEF) 0.1 MG tablet TAKE 1/2 TABLET(0.05 MG) BY MOUTH TWICE DAILY 30 tablet 11   gabapentin (NEURONTIN) 400 MG capsule TAKE 1 CAPSULE BY MOUTH TWICE DAILY 60 capsule 3   Glucagon 3 MG/DOSE POWD Place into the nose.     glucose blood test strip 1 each by Other route. 7 times per day   - as instructed     HUMALOG KWIKPEN 100 UNIT/ML KwikPen INJECT BEFORE EACH MEAL WITH 1 UNIT PER 10 GM CARBS PLUS CORRECTIN OF 1 UNIT PER 50 MG     insulin degludec (TRESIBA) 100 UNIT/ML FlexTouch Pen Inject 27 Units into the skin daily.      LATUDA 120 MG TABS Take 1 tablet by mouth at bedtime.     lisinopril (ZESTRIL) 10 MG tablet Take 1 tablet (10 mg total) by mouth daily. 90 tablet 3   pindolol (VISKEN) 5 MG tablet TAKE 1/2 TABLET(2.5 MG) BY MOUTH TWICE DAILY 90 tablet 3   POTASSIUM CITRATE PO Take 1 capsule by mouth every other day.     risperidone (RISPERDAL) 4 MG tablet Take 8 mg by mouth at bedtime.  0    scopolamine (TRANSDERM SCOP, 1.5 MG,) 1 MG/3DAYS Place 1 patch (1.5 mg total) onto the skin every 3 (three) days. 10 patch 12   traMADol (ULTRAM) 50 MG tablet TAKE 1 TABLET(50 MG) BY MOUTH EVERY 6 HOURS AS NEEDED FOR MODERATE PAIN 120 tablet 5   trihexyphenidyl (ARTANE) 2 MG tablet Take 1 tablet (2 mg total) by mouth 3 (three) times daily with meals. 90 tablet 11   zolpidem (AMBIEN) 10 MG tablet Take 10 mg by mouth at bedtime.  1   No facility-administered medications prior to  visit.    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)    Bipolar disorder (HCC) 02/15/2011   Cervical spondylosis without myelopathy    Depression    Depression    Diabetes mellitus    sees Dr Altheimer/endo   Dysautonomia (HCC) 10/24/2011   Dyskinesia     subacute,due to drugs   Dyslipidemia    History of nuclear stress test    Myoview 11/16: EF 81%, no ischemia; Low Risk   Hyperlipidemia    IBS (irritable bowel syndrome) 02/15/2011   Insomnia 02/21/2011   Multinodular thyroid    with large dominant solid nodule with calcifications in the lower left pole benign by FNA in 1/12 and stable by repeat ulltrasound in 4/13   Neuropathy    with paresthesias in feet   Orthostatic hypotension    Orthostatic hypotension    Parkinsonism    due to prolonged prozac/risperdl for yrs/Dr Dohmeir   Parkinsonism    POTS (postural orthostatic tachycardia syndrome)    Radiculopathy    cervical   Resting tremor    Rotator cuff syndrome of left shoulder    S/P partial hysterectomy    Spondylosis without myelopathy    Subacute dyskinesia due to drugs(333.85)    dr dohmeier/neuro   Tachycardia    syndrome   Therapy    on therapy by psychiatrist using sebutex    Thyroid nodule    s/p biopsy - benign approx jan 2012   Tremor     PAST SURGICAL  HISTORY: Past Surgical History:  Procedure Laterality Date   ACROMIOPLASTY Left 2002   Dr Darrelyn Hillock   BREAST EXCISIONAL BIOPSY Right    benign   BREAST LUMPECTOMY Right    herniated disc cervical spine     lower back  1992   OOPHORECTOMY     PARTIAL HYSTERECTOMY     SHOULDER SURGERY Left    SPINE SURGERY  feb 2012   c5-6 fusion, Dr Shon Baton   THYROID LOBECTOMY  11/11   nodule biopsy, benign    FAMILY HISTORY: Family History  Problem Relation Age of Onset   Lung cancer Mother    Heart disease Father    Hypertension Father    Pneumonia Father    Heart disease Paternal Grandmother    ALS Paternal Grandmother    Hypertension Sister     SOCIAL HISTORY: Social History   Socioeconomic History   Marital status: Married    Spouse name: Not on file   Number of children: 0   Years of education: BA   Highest education level: Not on file  Occupational History   Occupation: TEACHER    Employer: [employer]  Tobacco Use   Smoking status: Never   Smokeless tobacco: Never  Vaping Use   Vaping status: Never Used  Substance and Sexual Activity   Alcohol use: No    Alcohol/week: 0.0 standard drinks of alcohol   Drug use: No   Sexual activity: Never  Other Topics Concern   Not on file  Social History Narrative   Patient is single and lives with my partner Drinda Butts [place]).   Patient is disabled.   Patient is right-handed.   Patient has a Probation officer.   Patient drinks one soda  every other day.   Social Determinants of Health   Financial Resource Strain: Low Risk  (08/04/2022)   Overall Financial Resource Strain (CARDIA)    Difficulty of Paying Living Expenses: Not hard at all  Food Insecurity: No Food Insecurity (08/04/2022)   Hunger Vital Sign    Worried About Running Out of Food in the Last Year: Never true    Ran Out of Food in the Last Year: Never true  Transportation Needs: No Transportation Needs (08/04/2022)   PRAPARE - Scientist, research (physical sciences) (Medical): No    Lack of Transportation (Non-Medical): No  Physical Activity: Sufficiently Active (08/04/2022)   Exercise Vital Sign    Days of Exercise per Week: 3 days    Minutes of Exercise per Session: 60 min  Stress: No Stress Concern Present (08/04/2022)   [employer] of Occupational Health - Occupational Stress Questionnaire    Feeling of Stress : Only a little  Social Connections: Moderately Integrated (08/04/2022)   Social Connection and Isolation Panel [NHANES]    Frequency of Communication with Friends and Family: More than three times a week    Frequency of Social Gatherings with Friends and Family: Twice a week    Attends Religious Services: Never    Database administrator or Organizations: Yes    Attends Banker Meetings: Never    Marital Status: Married  Catering manager Violence: Not At Risk (08/04/2022)   Humiliation, Afraid, Rape, and Kick questionnaire    Fear of Current or Ex-Partner: No    Emotionally Abused: No    Physically Abused: No    Sexually Abused: No      PHYSICAL EXAM  Vitals:   10/15/22 1347 10/15/22 1359  BP: (!) 144/97 (!) 165/103  Pulse: 94 100  Weight: 150 lb (68 kg)   Height: 5\' 7"  (1.702 m)     Body mass index is 23.49 kg/m.     10/15/2022    1:48 PM 04/02/2016    3:45 PM 02/21/2015    3:37 PM  Montreal Cognitive Assessment   Visuospatial/ Executive (0/5) 5 5 5   Naming (0/3) 3 3 3   Attention: Read list of digits (0/2) 1 2 2   Attention: Read list of letters (0/1) 1 1 1   Attention: Serial 7 subtraction starting at 100 (0/3) 3 3 3   Language: Repeat phrase (0/2) 2 2 2   Language : Fluency (0/1) 0 0 0  Abstraction (0/2) 2 2 2   Delayed Recall (0/5) 1 2 3   Orientation (0/6) 6 5 5   Total 24 25 26   Adjusted Score (based on education)  25 26     Generalized: Well developed, in no acute distress   Neurological examination  Mentation: Alert oriented to time, place, history taking. Follows all commands  speech and language fluent Cranial nerve II-XII: Pupils were equal round reactive to light. Extraocular movements were full, visual field were full on confrontational test. Head turning and shoulder shrug  were normal and symmetric. Motor: The motor testing reveals 5 over 5 strength of all 4 extremities. Good symmetric motor tone is noted throughout.  Sensory: Sensory testing is intact to soft touch on all 4 extremities. No evidence of extinction is noted.  Coordination: Cerebellar testing reveals good finger-nose-finger and heel-to-shin bilaterally.  Gait and station: Gait is normal.  Reflexes: Deep tendon reflexes are symmetric and normal bilaterally.   DIAGNOSTIC DATA (LABS, IMAGING, TESTING) - I reviewed patient records, labs, notes, testing and imaging myself  where available.  Lab Results  Component Value Date   WBC 6.4 10/10/2022   HGB 13.4 10/10/2022   HCT 39.8 10/10/2022   MCV 96.9 10/10/2022   PLT 315.0 10/10/2022      Component Value Date/Time   NA 134 (L) 10/10/2022 1523   NA 143 12/22/2019 1506   K 4.1 10/10/2022 1523   CL 98 10/10/2022 1523   CO2 28 10/10/2022 1523   GLUCOSE 131 (H) 10/10/2022 1523   BUN 18 10/10/2022 1523   BUN 17 12/22/2019 1506   CREATININE 0.84 10/10/2022 1523   CREATININE 0.87 07/06/2015 1523   CALCIUM 9.3 10/10/2022 1523   PROT 6.5 10/10/2022 1523   PROT 6.3 06/05/2022 1251   ALBUMIN 3.9 10/10/2022 1523   ALBUMIN 4.1 06/05/2022 1251   AST 26 10/10/2022 1523   ALT 20 10/10/2022 1523   ALKPHOS 73 10/10/2022 1523   BILITOT 0.4 10/10/2022 1523   BILITOT 0.4 06/05/2022 1251   GFRNONAA >60 11/05/2021 0712   GFRAA 77 12/22/2019 1506   Lab Results  Component Value Date   CHOL 126 10/10/2022   HDL 46.60 10/10/2022   LDLCALC 66 10/10/2022   TRIG 70.0 10/10/2022   CHOLHDL 3 10/10/2022   Lab Results  Component Value Date   HGBA1C 7.9 (H) 10/10/2022   Lab Results  Component Value Date   VITAMINB12 >1501 (H) 10/10/2022   Lab Results   Component Value Date   TSH 1.42 10/10/2022      ASSESSMENT AND PLAN 62 y.o. 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), Bipolar disorder (HCC) (02/15/2011), Cervical spondylosis without myelopathy, Depression, Depression, Diabetes mellitus, Dysautonomia (HCC) (10/24/2011), Dyskinesia, Dyslipidemia, History of nuclear stress test, Hyperlipidemia, IBS (irritable bowel syndrome) (02/15/2011), Insomnia (02/21/2011), Multinodular thyroid, Neuropathy, Orthostatic hypotension, Orthostatic hypotension, Parkinsonism, Parkinsonism, POTS (postural orthostatic tachycardia syndrome), Radiculopathy, Resting tremor, Rotator cuff syndrome of left shoulder, S/P partial hysterectomy, Spondylosis without myelopathy, Subacute dyskinesia due to drugs(333.85), Tachycardia, Therapy, Thyroid nodule, and Tremor. here with :  Drug-induced parkinsonism  Continue Sinemet CR 50-200 mg BID Advised if symptoms worsen or she develops new symptoms she was advised to let us know Follow-up in 6 months or sooner if needed   Butch Penny, MSN, NP-C 10/14/2022, 4:29 PM [facility] [address] [place], [place] [address] [phone]

## 2022-10-15 ENCOUNTER — Ambulatory Visit: Payer: Medicare PPO | Admitting: Adult Health

## 2022-10-15 ENCOUNTER — Encounter: Payer: Self-pay | Admitting: Adult Health

## 2022-10-15 VITALS — BP 165/103 | HR 100 | Ht 67.0 in | Wt 150.0 lb

## 2022-10-15 DIAGNOSIS — G2119 Other drug induced secondary parkinsonism: Secondary | ICD-10-CM | POA: Diagnosis not present

## 2022-10-15 NOTE — Patient Instructions (Addendum)
Your Plan:  Continue Sinemet CR If your symptoms worsen or you develop new symptoms please let us know.   Thank you for coming to see [place] at [facility]. I hope we have been able to provide you high quality care today.  You may receive a patient satisfaction survey over the next few weeks. We would appreciate your feedback and comments so that we may continue to improve ourselves and the health of our patients.

## 2022-10-18 DIAGNOSIS — E1065 Type 1 diabetes mellitus with hyperglycemia: Secondary | ICD-10-CM | POA: Diagnosis not present

## 2022-10-21 ENCOUNTER — Encounter: Payer: Self-pay | Admitting: Internal Medicine

## 2022-10-21 ENCOUNTER — Ambulatory Visit: Payer: Medicare PPO | Admitting: Internal Medicine

## 2022-10-21 VITALS — BP 140/80 | HR 91 | Temp 98.3°F | Ht 67.0 in | Wt 147.0 lb

## 2022-10-21 DIAGNOSIS — I10 Essential (primary) hypertension: Secondary | ICD-10-CM

## 2022-10-21 MED ORDER — LISINOPRIL 20 MG PO TABS: 20.0000 mg | ORAL_TABLET | Freq: Every day | ORAL | 0 refills | Status: DC

## 2022-10-21 NOTE — Progress Notes (Unsigned)
   Subjective:   Patient ID: Lynn Barnes, female    DOB: [birth year], 62 y.o.   MRN: [id number]  HPI The patient is a 62 YO female coming in for high BP at neurology visit. Recent BP with PCP normal about 1-2 weeks ago.  Review of Systems  Objective:  Physical Exam  Vitals:   10/21/22 1606 10/21/22 1608  BP: (!) 140/80 (!) 140/80  Pulse: 91   Temp: 98.3 F (36.8 C)   TempSrc: Oral   SpO2: 99%   Weight: 147 lb (66.7 kg)   Height: 5\' 7"  (1.702 m)     Assessment & Plan:

## 2022-10-21 NOTE — Patient Instructions (Addendum)
We have sent in lisinopril 20 mg to increase. You can take 2 pills of what you have until it runs out.

## 2022-10-23 ENCOUNTER — Encounter: Payer: Self-pay | Admitting: Internal Medicine

## 2022-10-23 NOTE — Assessment & Plan Note (Signed)
BP high and labile. Increase lisinopril to 20 mg daily. Follow up 2-4 weeks for BP check and BMP. New rx done today.

## 2022-11-04 ENCOUNTER — Encounter: Payer: Self-pay | Admitting: Internal Medicine

## 2022-11-04 ENCOUNTER — Ambulatory Visit: Payer: Medicare PPO | Admitting: Internal Medicine

## 2022-11-04 VITALS — BP 132/70 | HR 78 | Temp 99.4°F | Ht 67.0 in | Wt 149.0 lb

## 2022-11-04 DIAGNOSIS — E1042 Type 1 diabetes mellitus with diabetic polyneuropathy: Secondary | ICD-10-CM | POA: Diagnosis not present

## 2022-11-04 DIAGNOSIS — I10 Essential (primary) hypertension: Secondary | ICD-10-CM | POA: Diagnosis not present

## 2022-11-04 DIAGNOSIS — E7800 Pure hypercholesterolemia, unspecified: Secondary | ICD-10-CM | POA: Diagnosis not present

## 2022-11-04 DIAGNOSIS — J069 Acute upper respiratory infection, unspecified: Secondary | ICD-10-CM

## 2022-11-04 MED ORDER — AZITHROMYCIN 250 MG PO TABS: ORAL_TABLET | ORAL | 1 refills | Status: AC

## 2022-11-04 NOTE — Patient Instructions (Signed)
Please take all new medication as prescribed - the antibiotic  Please call for cough med if your cough is worse, or try Delsym OTC as needed  Please continue all other medications as before, and refills have been done if requested.  Please have the pharmacy call with any other refills you may need.  Please keep your appointments with your specialists as you may have planned

## 2022-11-06 ENCOUNTER — Encounter: Payer: Self-pay | Admitting: Internal Medicine

## 2022-11-06 DIAGNOSIS — J069 Acute upper respiratory infection, unspecified: Secondary | ICD-10-CM | POA: Insufficient documentation

## 2022-11-06 NOTE — Assessment & Plan Note (Addendum)
Lab Results  Component Value Date   HGBA1C 7.9 (H) 10/10/2022   Mild uncontrolled, pt to continue current medical treatment humalog asd, f/u endo as planned, delcines other change today

## 2022-11-06 NOTE — Assessment & Plan Note (Signed)
Lab Results  Component Value Date   LDLCALC 66 10/10/2022   Stable, pt to continue current statin lipitor 40 every day, zetia 10 qd

## 2022-11-06 NOTE — Assessment & Plan Note (Signed)
BP Readings from Last 3 Encounters:  11/04/22 132/70  10/21/22 (!) 140/80  10/15/22 (!) 165/103   Stable, pt to continue medical treatment lisinopril 20 qd

## 2022-11-06 NOTE — Progress Notes (Signed)
Patient ID: Lynn Barnes, female   DOB: [birth year], 62 y.o.   MRN: [id number]        Chief Complaint: follow up URI, htn, dm, hld       HPI:  Lynn Barnes is a 62 y.o. female  Here with 2-3 days acute onset fever, facial pain, pressure, headache, general weakness and malaise, and greenish d/c, with mild ST and cough, but pt denies chest pain, wheezing, increased sob or doe, orthopnea, PND, increased LE swelling, palpitations, dizziness or syncope.   Pt denies polydipsia, polyuria, or new focal neuro s/s.    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)    Bipolar disorder (HCC) 02/15/2011   Cervical spondylosis without myelopathy    Depression    Depression    Diabetes mellitus    sees Dr Altheimer/endo   Dysautonomia (HCC) 10/24/2011   Dyskinesia     subacute,due to drugs   Dyslipidemia    History of nuclear stress test    Myoview 11/16: EF 81%, no ischemia; Low Risk   Hyperlipidemia    IBS (irritable bowel syndrome) 02/15/2011   Insomnia 02/21/2011   Multinodular thyroid    with large dominant solid nodule with calcifications in the lower left pole benign by FNA in 1/12 and stable by repeat ulltrasound in 4/13   Neuropathy    with paresthesias in feet   Orthostatic hypotension    Orthostatic hypotension    Parkinsonism    due to prolonged prozac/risperdl for yrs/Dr Dohmeir   Parkinsonism    POTS (postural orthostatic tachycardia syndrome)    Radiculopathy    cervical   Resting tremor    Rotator  cuff syndrome of left shoulder    S/P partial hysterectomy    Spondylosis without myelopathy    Subacute dyskinesia due to drugs(333.85)    dr dohmeier/neuro   Tachycardia    syndrome   Therapy    on therapy by psychiatrist using sebutex    Thyroid nodule    s/p biopsy - benign approx jan 2012   Tremor    Past Surgical History:  Procedure Laterality Date   ACROMIOPLASTY Left 2002   Dr Darrelyn Hillock   BREAST EXCISIONAL BIOPSY Right    benign   BREAST LUMPECTOMY Right    herniated disc cervical spine     lower back  1992   OOPHORECTOMY     PARTIAL HYSTERECTOMY     SHOULDER SURGERY Left    SPINE SURGERY  feb 2012   c5-6 fusion, Dr Shon Baton   THYROID LOBECTOMY  11/11   nodule biopsy, benign    reports that she has never smoked. She has never used smokeless tobacco. She reports that she does not drink alcohol and does not use drugs. family history includes ALS in her paternal grandmother; Heart disease in her father and paternal grandmother;  Hypertension in her father and sister; Lung cancer in her mother; Pneumonia in her father. Allergies  Allergen Reactions   Klonopin [Clonazepam] Other (See Comments)    Severe weakness   Nsaids Other (See Comments)    Irritates stomach   Sulfonamide Derivatives Other (See Comments)   Sulfa Antibiotics Rash   Current Outpatient Medications on File Prior to Visit  Medication Sig Dispense Refill   atorvastatin (LIPITOR) 40 MG tablet TAKE 1 TABLET(40 MG) BY MOUTH DAILY 90 tablet 3   busPIRone (BUSPAR) 10 MG tablet Take 20 mg by mouth 2 (two) times daily.     carbidopa-levodopa (SINEMET CR) 50-200 MG tablet TAKE 1 TABLET BY MOUTH TWICE DAILY. 270 tablet 3   ezetimibe (ZETIA) 10 MG tablet Take 1 tablet (10 mg total) by mouth daily. 90 tablet 3   fludrocortisone (FLORINEF) 0.1 MG tablet TAKE 1/2 TABLET(0.05 MG) BY MOUTH TWICE DAILY 30 tablet 11   gabapentin (NEURONTIN) 400 MG capsule Take by mouth.     Glucagon 3 MG/DOSE POWD Place into the nose.      glucose blood test strip 1 each by Other route. 7 times per day   - as instructed     HUMALOG KWIKPEN 100 UNIT/ML KwikPen INJECT BEFORE EACH MEAL WITH 1 UNIT PER 10 GM CARBS PLUS CORRECTIN OF 1 UNIT PER 50 MG     insulin degludec (TRESIBA) 100 UNIT/ML FlexTouch Pen Inject 27 Units into the skin daily.      LATUDA 120 MG TABS Take 1 tablet by mouth at bedtime.     lisinopril (ZESTRIL) 20 MG tablet Take 1 tablet (20 mg total) by mouth daily. 90 tablet 0   pindolol (VISKEN) 5 MG tablet TAKE 1/2 TABLET(2.5 MG) BY MOUTH TWICE DAILY 90 tablet 3   POTASSIUM CITRATE PO Take 1 capsule by mouth every other day.     risperidone (RISPERDAL) 4 MG tablet Take 8 mg by mouth at bedtime.  0   scopolamine (TRANSDERM SCOP, 1.5 MG,) 1 MG/3DAYS Place 1 patch (1.5 mg total) onto the skin every 3 (three) days. 10 patch 12   traMADol (ULTRAM) 50 MG tablet TAKE 1 TABLET(50 MG) BY MOUTH EVERY 6 HOURS AS NEEDED FOR MODERATE PAIN 120 tablet 5   trihexyphenidyl (ARTANE) 2 MG tablet Take 1 tablet (2 mg total) by mouth 3 (three) times daily with meals. 90 tablet 11   zolpidem (AMBIEN) 10 MG tablet Take 10 mg by mouth at bedtime.  1   No current facility-administered medications on file prior to visit.        ROS:  All others reviewed and negative.  Objective        PE:  BP 132/70 (BP Location: Left Arm, Patient Position: Sitting, Cuff Size: Normal)   Pulse 78   Temp 99.4 F (37.4 C) (Oral)   Ht 5\' 7"  (1.702 m)   Wt 149 lb (67.6 kg)   SpO2 99%   BMI 23.34 kg/m                 Constitutional: Pt appears in NAD, mild ill               HENT: Head: NCAT.                Right Ear: External ear normal.                 Left Ear: External ear normal.  Bilat tm's with mild erythema.  Max sinus areas mild  tender.  Pharynx with mild erythema, no exudate               Eyes: . Pupils are equal, round, and reactive to light. Conjunctivae and EOM are normal               Nose: without d/c or deformity               Neck:  Neck supple. Gross normal ROM               Cardiovascular: Normal rate and regular rhythm.                 Pulmonary/Chest: Effort normal and breath sounds without rales or wheezing.                Abd:  Soft, NT, ND, + BS, no organomegaly               Neurological: Pt is alert. At baseline orientation, motor grossly intact               Skin: Skin is warm. No rashes, no other new lesions, LE edema - none               Psychiatric: Pt behavior is normal without agitation   Micro: none  Cardiac tracings I have personally interpreted today:  none  Pertinent Radiological findings (summarize): none   Lab Results  Component Value Date   WBC 6.4 10/10/2022   HGB 13.4 10/10/2022   HCT 39.8 10/10/2022   PLT 315.0 10/10/2022   GLUCOSE 131 (H) 10/10/2022   CHOL 126 10/10/2022   TRIG 70.0 10/10/2022   HDL 46.60 10/10/2022   LDLCALC 66 10/10/2022   ALT 20 10/10/2022   AST 26 10/10/2022   NA 134 (L) 10/10/2022   K 4.1 10/10/2022   CL 98 10/10/2022   CREATININE 0.84 10/10/2022   BUN 18 10/10/2022   CO2 28 10/10/2022   TSH 1.42 10/10/2022   HGBA1C 7.9 (H) 10/10/2022   MICROALBUR <0.7 10/10/2022   Assessment/Plan:  Lynn Barnes is a 62 y.o. White or Caucasian [1] female with  has a past medical history of 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 [facility]), Addiction, opium (HCC), Allergy, Anxiety, 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 [facility]), Bipolar disorder (HCC) (02/15/2011), Cervical spondylosis without myelopathy, Depression, Depression, Diabetes mellitus, Dysautonomia (HCC) (10/24/2011), Dyskinesia, Dyslipidemia, History of nuclear stress test, Hyperlipidemia, IBS (irritable bowel syndrome) (02/15/2011), Insomnia (02/21/2011), Multinodular thyroid, Neuropathy, Orthostatic hypotension, Orthostatic hypotension, Parkinsonism, Parkinsonism, POTS (postural orthostatic tachycardia syndrome), Radiculopathy, Resting tremor, Rotator cuff syndrome of left shoulder, S/P partial hysterectomy, Spondylosis without myelopathy,  Subacute dyskinesia due to drugs(333.85), Tachycardia, Therapy, Thyroid nodule, and Tremor.  Acute URI Mild to mod, for antibx course zpack,  to f/u any worsening symptoms or concerns  HTN (hypertension) BP Readings from Last 3 Encounters:  11/04/22 132/70  10/21/22 (!) 140/80  10/15/22 (!) 165/103   Stable, pt to continue medical treatment lisinopril 20 qd   Hyperlipidemia Lab Results  Component Value Date   LDLCALC 66 10/10/2022   Stable, pt to continue current statin lipitor 40 every day, zetia 10 qd   Type 1 diabetes mellitus (HCC) Lab Results  Component Value Date   HGBA1C 7.9 (H) 10/10/2022   Mild uncontrolled, pt to continue current medical treatment humalog asd, f/u endo as planned, delcines other change today  Followup: Return if symptoms worsen or fail to improve.  Oliver Barre, MD 11/06/2022 9:29 PM [facility] [place]  Primary Care - [facility]

## 2022-11-06 NOTE — Assessment & Plan Note (Signed)
Mild to mod, for antibx course zpack,  to f/u any worsening symptoms or concerns

## 2022-11-13 DIAGNOSIS — E109 Type 1 diabetes mellitus without complications: Secondary | ICD-10-CM | POA: Diagnosis not present

## 2022-11-13 DIAGNOSIS — E042 Nontoxic multinodular goiter: Secondary | ICD-10-CM | POA: Diagnosis not present

## 2022-11-13 DIAGNOSIS — E041 Nontoxic single thyroid nodule: Secondary | ICD-10-CM | POA: Diagnosis not present

## 2022-11-14 ENCOUNTER — Telehealth: Payer: Self-pay | Admitting: Internal Medicine

## 2022-11-14 NOTE — Telephone Encounter (Signed)
Patient stated on Wednesday her BP jumped up to 201/115. Patient took an extra dose of her lisinopril and her BP came down to 149/78. Patient stated on Thursday her BP was great and this morning her BP was 119/70. Asked patient if she had anything that might have stress her that day. Patient stated she was having a lot of Orthostatic Hypotension issues that day, and that could have been her stressor. Informed patient that we will see if Dr. Tenny Craw will be okay with her taking an extra dose of lisinopril if this happens again. Patient agreed to plan.

## 2022-11-14 NOTE — Telephone Encounter (Signed)
Pt c/o BP issue: STAT if pt c/o blurred vision, one-sided weakness or slurred speech  1. What are your last 5 BP readings? 201/115; 149/78 (after taking bp pill)  2. Are you having any other symptoms (ex. Dizziness, headache, blurred vision, passed out)? no  3. What is your BP issue? Bp is running high.

## 2022-11-17 NOTE — Telephone Encounter (Signed)
Yes, it is ok to take extra lisinopril if needed for BP over 165/ Keep log if continues to happen  May need to adjust meds

## 2022-11-18 NOTE — Telephone Encounter (Signed)
Pt advised.... she has been keeping a log over the past week... she has not had any readings near 165 systolic but will continue to monitor.[name] a few readings:   112/62, 125/63, 119/70, 134/65. 132/82, 141/68 (evening)... she has been feeling well... at this point she will not make any changes and let us know if she has any problems.

## 2022-12-02 ENCOUNTER — Telehealth: Payer: Self-pay | Admitting: Internal Medicine

## 2022-12-02 DIAGNOSIS — F3189 Other bipolar disorder: Secondary | ICD-10-CM | POA: Diagnosis not present

## 2022-12-02 DIAGNOSIS — E2839 Other primary ovarian failure: Secondary | ICD-10-CM

## 2022-12-02 DIAGNOSIS — F5105 Insomnia due to other mental disorder: Secondary | ICD-10-CM | POA: Diagnosis not present

## 2022-12-02 NOTE — Telephone Encounter (Signed)
Pt called inquiring about a  bone density test wondering do she need to have one. Please advise.

## 2022-12-02 NOTE — Telephone Encounter (Signed)
Ok this is ordered  Please to assist pt with making an appt at the [facility] bone density testing, thanks

## 2022-12-04 ENCOUNTER — Telehealth: Payer: Self-pay | Admitting: Internal Medicine

## 2022-12-04 NOTE — Telephone Encounter (Signed)
Spoke with the patient who states that her blood pressure has continued to be elevated. She is averaging 150/90. She states that she has been taking an extra lisinopril almost daily. Will send to Dr. Tenny Craw for recommendations as per last note she may need medication adjustment if blood pressure remains elevated.

## 2022-12-04 NOTE — Telephone Encounter (Signed)
Pt returning call, requesting cb

## 2022-12-04 NOTE — Telephone Encounter (Signed)
Left voicemail to return call to office.

## 2022-12-04 NOTE — Telephone Encounter (Signed)
Pt c/o medication issue:  1. Name of Medication:   pindolol (VISKEN) 5 MG tablet   fludrocortisone (FLORINEF) 0.1 MG tablet  2. How are you currently taking this medication (dosage and times per day)?   3. Are you having a reaction (difficulty breathing--STAT)? No  4. What is your medication issue? Pt states that she has had increase lisinopril and wants to know if she still needs to be taking this medications above. Please advise

## 2022-12-04 NOTE — Telephone Encounter (Signed)
I would try 1.  Back off on florinef to 1x per day 2.  INcrease pindolol to 5 mg 2x per day Keep following BP

## 2022-12-05 DIAGNOSIS — E042 Nontoxic multinodular goiter: Secondary | ICD-10-CM | POA: Diagnosis not present

## 2022-12-05 MED ORDER — PINDOLOL 5 MG PO TABS: ORAL_TABLET | ORAL | Status: DC

## 2022-12-05 MED ORDER — FLUDROCORTISONE ACETATE 0.1 MG PO TABS: 0.0500 mg | ORAL_TABLET | Freq: Every day | ORAL | Status: DC

## 2022-12-05 NOTE — Telephone Encounter (Signed)
Pt advised and will keep a log of her BP.

## 2022-12-08 ENCOUNTER — Telehealth: Payer: Self-pay | Admitting: Internal Medicine

## 2022-12-08 MED ORDER — LISINOPRIL 20 MG PO TABS: 20.0000 mg | ORAL_TABLET | Freq: Every day | ORAL | 1 refills | Status: DC

## 2022-12-08 MED ORDER — PINDOLOL 5 MG PO TABS: ORAL_TABLET | ORAL | 1 refills | Status: DC

## 2022-12-08 NOTE — Telephone Encounter (Signed)
Pt c/o medication issue:  1. Name of Medication:  pindolol (VISKEN) 5 MG tablet  lisinopril (ZESTRIL) 20 MG tablet   2. How are you currently taking this medication (dosage and times per day)? Both medications twice daily  3. Are you having a reaction (difficulty breathing--STAT)? No   4. What is your medication issue? Is wanting to confirm if she should be taking the pindolol once in the morning and evening or two tablets in the morning. She also has been taking the lisinopril twice daily so she would like to know if Dr. Tenny Craw will fill the medication with anew prescription of twice daily even though she is not the original prescriber.

## 2022-12-08 NOTE — Telephone Encounter (Signed)
Called patient back to discuss refills. Patient stated he BP has been doing okay and she is not having to take an extra lisinopril often, but will need refills since she has taken more than her previous prescription had (see phone note from 11/14/22). Patient also needs refills on  Pindolol. Sent both in for patient with one refill. Since patient will need to follow up with Dr. Tenny Craw in February.

## 2022-12-08 NOTE — Telephone Encounter (Signed)
Left message for patient to call back  

## 2022-12-08 NOTE — Telephone Encounter (Signed)
Pt is returning call.  

## 2022-12-08 NOTE — Telephone Encounter (Signed)
  Pt c/o BP issue: STAT if pt c/o blurred vision, one-sided weakness or slurred speech  1. What are your last 5 BP readings?  10/25 - Am 125/70             PM 150/88 10/26 - AM 128/70             PM 131/72 10/27 - AM 141/95             PM 154/83  2. Are you having any other symptoms (ex. Dizziness, headache, blurred vision, passed out)? None   3. What is your BP issue? Pt said, her BP has been elevated. She's been taking her medications regularly. She would like to check in with Dr. Tenny Craw if she need to make changes on her medications or need to see her on the office

## 2022-12-12 ENCOUNTER — Other Ambulatory Visit: Payer: Self-pay

## 2022-12-12 MED ORDER — ATORVASTATIN CALCIUM 40 MG PO TABS: 40.0000 mg | ORAL_TABLET | Freq: Every day | ORAL | 3 refills | Status: DC

## 2022-12-16 ENCOUNTER — Telehealth: Payer: Self-pay | Admitting: Internal Medicine

## 2022-12-16 ENCOUNTER — Inpatient Hospital Stay: Admission: RE | Admit: 2022-12-16 | Payer: Medicare PPO | Source: Ambulatory Visit

## 2022-12-16 NOTE — Telephone Encounter (Signed)
Patient called to report BP readings as requested by Dr. Tenny Craw.  10/31 am 135/81           pm 127/72 11/1 am 131/78         pm 142/74 11/2 am 131/74         pm 142/76 11/3 am 120/67         pm 147/76 11/4 am 129/77         pm 142/78 11/5 am 130/72  Patient noted she is taking 2 pindolol (VISKEN) 5 MG tablet and 1lisinopril (ZESTRIL) 20 MG tablet daily.

## 2022-12-17 ENCOUNTER — Other Ambulatory Visit: Payer: Self-pay

## 2022-12-17 ENCOUNTER — Ambulatory Visit: Payer: Medicare PPO | Admitting: Family Medicine

## 2022-12-17 ENCOUNTER — Ambulatory Visit: Payer: Medicare PPO

## 2022-12-17 VITALS — BP 162/92 | HR 80 | Ht 67.0 in | Wt 150.0 lb

## 2022-12-17 DIAGNOSIS — M19011 Primary osteoarthritis, right shoulder: Secondary | ICD-10-CM | POA: Diagnosis not present

## 2022-12-17 DIAGNOSIS — G8929 Other chronic pain: Secondary | ICD-10-CM | POA: Diagnosis not present

## 2022-12-17 DIAGNOSIS — M25511 Pain in right shoulder: Secondary | ICD-10-CM

## 2022-12-17 NOTE — Progress Notes (Signed)
   Rubin Payor, PhD, LAT, ATC acting as a scribe for [name], MD.  Lynn Barnes is a 62 y.o. female who presents to [facility] today for R shoulder pain. Pt was previously seen by Dr. Denyse Amass on 05/29/21 for LBP.  Today, pt c/o R shoulder pain x 1 month. No injury. Pt locates pain to the anterior aspect of her R shoulder and into the proximal upper arm.   Radiates: yes- slightly Aggravates: any shoulder AROM Treatments tried: tramadol, IBU  Pertinent review of systems: No fevers or chills  Relevant historical information: Medical history significant for diabetes and Parkinson's disease.   Exam:  BP (!) 162/92   Pulse 80   Ht 5\' 7"  (1.702 m)   Wt 150 lb (68 kg)   SpO2 97%   BMI 23.49 kg/m  General: Well Developed, well nourished, and in no acute distress.   MSK: Right shoulder normal-appearing Range of motion is intact.  Patient has intact abduction.  Functional internal rotation present to the lumbar spine external rotation is intact. Strength is intact although patient does experience pain with abduction. Positive Hawkins and Neer's test. Negative Yergason's and speeds test.    Lab and Radiology Results  Diagnostic Limited MSK Ultrasound of: Right shoulder Biceps tendon is intact and normal. Subscapularis tendon is intact. Supraspinatus tendon intact without visible tear. Infraspinatus tendon is intact Impression: No acute large retracted rotator cuff tear.  X-ray images right shoulder obtained today personally and independently interpreted Mild glenohumeral and AC DJD.  No acute fractures are visible.. Await formal radiology review    Assessment and Plan: 62 y.o. female with right shoulder pain.  Despite having multiple risk factors for adhesive capsulitis her pain is more consistent today with impingement or subacromial bursitis.  Plan for trial of physical therapy.  Recheck in 6 weeks.  If not improved consider  injection.  I would like to avoid an injection if possible given her diabetes.  It is well-controlled but she is still at higher risk for hyperglycemia.   PDMP not reviewed this encounter. Orders Placed This Encounter  Procedures   [place] LIMITED JOINT SPACE STRUCTURES UP RIGHT(NO LINKED CHARGES)    Order Specific Question:   Reason for Exam (SYMPTOM  OR DIAGNOSIS REQUIRED)    Answer:   right shoulder pain    Order Specific Question:   Preferred imaging location?    Answer:   [facility] Shoulder Right    Standing Status:   Future    Number of Occurrences:   1    Standing Expiration Date:   12/17/2023    Order Specific Question:   Reason for Exam (SYMPTOM  OR DIAGNOSIS REQUIRED)    Answer:   right shoulder pain    Order Specific Question:   Preferred imaging location?    Answer:   [facility]   Ambulatory referral to Physical Therapy    Referral Priority:   Routine    Referral Type:   Physical Medicine    Referral Reason:   Specialty Services Required    Requested Specialty:   Physical Therapy    Number of Visits Requested:   1   No orders of the defined types were placed in this encounter.    Discussed warning signs or symptoms. Please see discharge instructions. Patient expresses understanding.   The above documentation has been reviewed and is accurate and complete [name], M.D.

## 2022-12-17 NOTE — Patient Instructions (Addendum)
Thank you for coming in today.   Please get an Xray today before you leave   I've referred you to Physical Therapy.  Let us know if you don't hear from them in one week.   Recheck in about 6 weeks.   Let me know if this is not working.    

## 2022-12-17 NOTE — Telephone Encounter (Signed)
MyChart message sent to the pt.

## 2022-12-17 NOTE — Telephone Encounter (Signed)
Continue to track BP   Still a little high at times

## 2022-12-29 NOTE — Therapy (Signed)
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   Addiction, opium (HCC)    Allergy    spring   Anxiety    ADHD CHRONIC ANXIETY   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 (HCC)    Bipolar disorder (HCC) 02/15/2011   Cervical spondylosis without myelopathy    Depression    Depression    Diabetes mellitus    sees Dr Altheimer/endo   Dysautonomia (HCC) 10/24/2011   Dyskinesia     subacute,due to drugs   Dyslipidemia    History of nuclear stress test    Myoview 11/16: EF 81%, no ischemia; Low Risk   Hyperlipidemia    IBS (irritable bowel syndrome) 02/15/2011   Insomnia 02/21/2011   Multinodular thyroid    with large dominant solid nodule with calcifications in the lower left pole benign by FNA in 1/12 and stable by repeat ulltrasound in 4/13   Neuropathy    with paresthesias in feet   Orthostatic hypotension    Orthostatic hypotension    Parkinsonism (HCC)    due to prolonged prozac/risperdl for yrs/Dr Dohmeir   Parkinsonism (HCC)    POTS (postural orthostatic tachycardia syndrome)    Radiculopathy    cervical   Resting tremor    Rotator cuff syndrome of left shoulder    S/P partial hysterectomy    Spondylosis without myelopathy    Subacute dyskinesia due to drugs(333.85)    dr dohmeier/neuro   Tachycardia    syndrome   Therapy    on therapy by psychiatrist using sebutex    Thyroid nodule    s/p biopsy - benign approx jan 2012   Tremor     Past Surgical History:  Procedure Laterality Date   ACROMIOPLASTY Left 2002   Dr Darrelyn Hillock   BREAST EXCISIONAL BIOPSY Right    benign   BREAST LUMPECTOMY Right    herniated disc cervical spine     lower back  1992   OOPHORECTOMY     PARTIAL HYSTERECTOMY     SHOULDER SURGERY Left    SPINE SURGERY  feb 2012   c5-6 fusion, Dr Shon Baton   THYROID LOBECTOMY  11/11   nodule biopsy, benign   Patient Active Problem List   Diagnosis Date Noted   Acute URI 11/06/2022   Acquired deformity of lower leg 10/10/2022   HTN (hypertension) 11/09/2021   Hypoglycemia unawareness associated with type 1 diabetes mellitus (HCC) 05/24/2021   Blister of right heel 09/07/2020   Uses self-applied continuous glucose monitoring device 04/05/2020   Posterior neck pain 09/23/2018   Parkinsonism with orthostatic hypotension (HCC) 01/05/2018   Type 1 diabetes mellitus with hyperglycemia (HCC) 06/03/2016   Insulin pump status 03/03/2016   Mixed dyslipidemia 03/03/2016   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) 04/26/2015   Severe recurrent major depressive disorder  with psychotic features (HCC) 04/26/2015   Parkinsonian syndrome associated with symptomatic orthostatic hypotension (HCC) 04/26/2015   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) 02/21/2015   Allergic rhinitis 02/20/2015   Regurgitation of food 08/17/2014   Memory dysfunction 08/17/2014   Lumbar degenerative disc disease 12/20/2013   Greater trochanteric bursitis of right hip 03/23/2013   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 (HCC)    POTS (postural orthostatic tachycardia syndrome) 03/06/2012   Subacute dyskinesia due to drug 10/24/2011   Subacute dyskinesia due to drug 10/24/2011   ADD (attention deficit disorder) 02/23/2011   Insomnia 02/21/2011   Syncope 02/19/2011   Parkinsonism (HCC)    Thyroid nodule    Encounter for well adult exam with abnormal findings 02/15/2011   IBS (irritable bowel syndrome) 02/15/2011   Bipolar  disorder (HCC) 02/15/2011   Brachial neuritis or radiculitis 02/08/2010   Neuropathy 03/20/2009   DRUG DEPENDENCE 03/20/2009   UNSPECIFIED DRUG DEPENDENCE IN REMISSION 07/18/2008   Anxiety state 11/11/2007   Type 1 diabetes mellitus (HCC) 12/02/2006   Hyperlipidemia 12/02/2006   DEPRESSIVE DISORDER, RCR, MODERATE 12/02/2006    PCP: Oliver Barre  REFERRING PROVIDER: [name]  REFERRING DIAG:  [phone] (ICD-10-CM) - Chronic right shoulder pain      THERAPY DIAG:  No diagnosis found.  Rationale for Evaluation and Treatment: Rehabilitation  ONSET DATE: "1 month"  SUBJECTIVE:                                                                                                                                                                                      SUBJECTIVE STATEMENT: I took 4 ibuprofen and 2 tylenols before coming. Pain started about 6 weeks ago and progressively got worse.   Hand dominance: Right  PERTINENT HISTORY: Lynn Barnes is a 62 y.o. female who presents to [facility] at [facility] today for R shoulder pain. Pt was previously seen by Dr. Denyse Amass on 05/29/21 for LBP.   Today, pt c/o R shoulder pain x 1 month. No injury. Pt locates pain to the anterior aspect of her R shoulder and into the proximal upper arm  Medical history significant for diabetes and Parkinson's disease.   PAIN:  Are you having pain? Yes: NPRS scale: 6-7/10 Pain location: r shoulder radiates down into deltoid and bicep Pain description: sharp  Aggravating factors: overhead, reaching out Relieving factors: just pain meds  PRECAUTIONS: None  RED FLAGS: None   WEIGHT BEARING RESTRICTIONS: No  FALLS:  Has patient fallen in last 6 months? No  LIVING ENVIRONMENT: Lives with: lives with their family and lives with their spouse Lives in: House/apartment Stairs: No Has following equipment at home:  None  OCCUPATION: Retired  PLOF: Independent and Independent  with basic ADLs  PATIENT GOALS: to be able to reach and not have pain   NEXT MD VISIT:   OBJECTIVE:  Note: Objective measures were completed at Evaluation unless otherwise noted.  DIAGNOSTIC FINDINGS:  Right shoulder Biceps tendon is intact and normal. Subscapularis tendon is intact. Supraspinatus tendon intact without visible tear. Infraspinatus tendon is intact Impression: No acute large retracted rotator cuff tear.   X-ray images right shoulder obtained today personally and independently interpreted Mild glenohumeral and AC DJD.  No acute fractures are visible.  PATIENT SURVEYS:  FOTO 43  COGNITION: Overall cognitive status: Within functional limits for tasks assessed     SENSATION: WFL  POSTURE: Rounded shoulders  UPPER EXTREMITY ROM:   Active ROM Right eval Left eval  Shoulder flexion 160 w/end range pain WNL  Shoulder extension    Shoulder abduction 160 w end range /pain   Shoulder adduction    Shoulder internal rotation WNL WNL  Shoulder external rotation WNL WNL  Elbow flexion    Elbow extension    Wrist flexion    Wrist extension    Wrist ulnar deviation    Wrist radial deviation    Wrist pronation    Wrist supination    (Blank rows = not tested)  UPPER EXTREMITY MMT:  MMT Right eval Left eval  Shoulder flexion 4 with pain 5  Shoulder extension    Shoulder abduction 4 with pain 5  Shoulder adduction    Shoulder internal rotation 5 5  Shoulder external rotation 5 5  Middle trapezius    Lower trapezius    Elbow flexion 5 5  Elbow extension 5 5  Wrist flexion    Wrist extension    Wrist ulnar deviation    Wrist radial deviation    Wrist pronation    Wrist supination    Grip strength (lbs)    (Blank rows = not tested)  SHOULDER SPECIAL TESTS: Impingement tests: Neer impingement test: positive , Hawkins/Kennedy impingement test: positive , and Painful arc test: positive   Rotator cuff assessment: Empty can test: positive   JOINT  MOBILITY TESTING:  Able to achieve full PROM with some end range pain  PALPATION:  No TTP   TODAY'S TREATMENT:                                                                                                                                         DATE: EVAL 12/31/22   PATIENT EDUCATION: Education details: POC, HEP, shoulder anatomy, education about subacromial impingement and bursitis  Person educated: Patient Education method: Explanation Education comprehension: verbalized understanding  HOME EXERCISE PROGRAM: Access Code: [email or web address] URL: [email or web address] Date: 12/31/2022 Prepared by: Cassie Freer  Exercises - Shoulder External Rotation and Scapular Retraction with Resistance  - 1 x daily - 7 x weekly - 2 sets - 10 reps -  Standing Shoulder Row with Anchored Resistance  - 1 x daily - 7 x weekly - 2 sets - 10 reps - Shoulder extension with resistance - Neutral  - 1 x daily - 7 x weekly - 2 sets - 10 reps - Doorway Pec Stretch at 90 Degrees Abduction  - 1 x daily - 7 x weekly - 2 reps - 15 hold  ASSESSMENT:  CLINICAL IMPRESSION: Patient is a 62 y.o. female who was seen today for physical therapy evaluation and treatment for R shoulder pain. She does having multiple risk factors for adhesive capsulitis however due to good ROM, her pain is more consistent with impingement or subacromial bursitis. She has difficulty holding her arm up and the pain is more intense when bringing in back down. She is able to get [facility] but has pain at end ranges. She demonstrates decent strength overall. Patient will benefit from skilled PT to address her R shoulder pain to be able to complete ADLs, overhead activities, and reach without difficulty.   OBJECTIVE IMPAIRMENTS: decreased ROM, decreased strength, impaired UE functional use, and pain.   ACTIVITY LIMITATIONS: carrying, lifting, and reach over head  PARTICIPATION LIMITATIONS: cleaning, laundry, community activity, and yard  work  [facility] POTENTIAL: Good  CLINICAL DECISION MAKING: Stable/uncomplicated  EVALUATION COMPLEXITY: Low   GOALS: Goals reviewed with patient? Yes  SHORT TERM GOALS: Target date: 02/11/23  Patient will be independent with initial HEP.  Baseline: given 12/31/22 Goal status: INITIAL  LONG TERM GOALS: Target date: 03/25/23  Patient will be independent with advanced/ongoing HEP to improve outcomes and carryover.  Goal status: INITIAL  2.  Patient will report 75% improvement in R shoulder pain to improve QOL.  Baseline: 6-7/10  Goal status: INITIAL  3.  Patient will be able to complete overhead activities and reach without pain  Baseline: + test between 60-120d, pain with overhead tasks Goal status: INITIAL  4.  Patient will report 42 or better on FOTO to demonstrate improved functional ability.  Baseline: 43 Goal status: INITIAL  PLAN:  PT FREQUENCY: 1x/week  PT DURATION: 12 weeks  PLANNED INTERVENTIONS: 97110-Therapeutic exercises, 97530- Therapeutic activity, 97112- Neuromuscular re-education, 97535- Self Care, 02725- Manual therapy, 97014- Electrical stimulation (unattended), 97016- Vasopneumatic device, [id number]- Ultrasound, 36644- Ionotophoresis 4mg /ml Dexamethasone, Patient/Family education, Taping, Dry Needling, Joint mobilization, Cryotherapy, and Moist heat  PLAN FOR NEXT SESSION: shoulder and postural strengthening, STW/manual therapy and can try taping or ionto    Cassie Freer, PT 12/31/2022, 2:38 PM

## 2022-12-31 ENCOUNTER — Ambulatory Visit: Payer: Medicare PPO | Attending: Family Medicine

## 2022-12-31 DIAGNOSIS — M7541 Impingement syndrome of right shoulder: Secondary | ICD-10-CM | POA: Diagnosis not present

## 2022-12-31 DIAGNOSIS — G8929 Other chronic pain: Secondary | ICD-10-CM | POA: Insufficient documentation

## 2022-12-31 DIAGNOSIS — M7551 Bursitis of right shoulder: Secondary | ICD-10-CM | POA: Diagnosis not present

## 2022-12-31 DIAGNOSIS — M25511 Pain in right shoulder: Secondary | ICD-10-CM | POA: Diagnosis not present

## 2022-12-31 DIAGNOSIS — M25611 Stiffness of right shoulder, not elsewhere classified: Secondary | ICD-10-CM | POA: Insufficient documentation

## 2023-01-05 NOTE — Progress Notes (Signed)
Right shoulder x-ray shows medium arthritis

## 2023-01-08 ENCOUNTER — Other Ambulatory Visit: Payer: Self-pay | Admitting: Internal Medicine

## 2023-01-13 NOTE — Therapy (Signed)
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   Addiction, opium (HCC)    Allergy    spring   Anxiety    ADHD CHRONIC ANXIETY   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 (HCC)    Bipolar disorder (HCC) 02/15/2011   Cervical spondylosis without myelopathy    Depression    Depression    Diabetes mellitus    sees Dr Altheimer/endo   Dysautonomia (HCC) 10/24/2011   Dyskinesia     subacute,due to drugs   Dyslipidemia    History of nuclear stress test    Myoview 11/16: EF 81%, no ischemia; Low Risk   Hyperlipidemia    IBS (irritable bowel syndrome) 02/15/2011   Insomnia 02/21/2011   Multinodular thyroid    with large dominant solid nodule with calcifications in the lower left pole benign by FNA in 1/12 and stable by repeat ulltrasound in 4/13   Neuropathy    with paresthesias in feet   Orthostatic hypotension    Orthostatic hypotension    Parkinsonism (HCC)    due to prolonged prozac/risperdl for yrs/Dr Dohmeir   Parkinsonism (HCC)    POTS (postural orthostatic tachycardia syndrome)    Radiculopathy    cervical   Resting tremor    Rotator cuff syndrome of left shoulder    S/P partial hysterectomy    Spondylosis without myelopathy    Subacute dyskinesia due to drugs(333.85)    dr dohmeier/neuro   Tachycardia    syndrome   Therapy    on therapy by psychiatrist using sebutex    Thyroid nodule    s/p biopsy - benign approx jan 2012   Tremor     Past Surgical History:  Procedure Laterality Date   ACROMIOPLASTY Left 2002   Dr Darrelyn Hillock   BREAST EXCISIONAL BIOPSY Right    benign   BREAST LUMPECTOMY Right    herniated disc cervical spine     lower back  1992   OOPHORECTOMY     PARTIAL HYSTERECTOMY     SHOULDER SURGERY Left    SPINE SURGERY  feb 2012   c5-6 fusion, Dr Shon Baton   THYROID LOBECTOMY  11/11   nodule biopsy, benign   Patient Active Problem List   Diagnosis Date Noted   Acute URI 11/06/2022   Acquired deformity of lower leg 10/10/2022   HTN (hypertension) 11/09/2021   Hypoglycemia unawareness associated with type 1 diabetes mellitus (HCC) 05/24/2021   Blister of right heel 09/07/2020   Uses self-applied continuous glucose monitoring device 04/05/2020   Posterior neck pain 09/23/2018   Parkinsonism with orthostatic hypotension (HCC) 01/05/2018   Type 1 diabetes mellitus with hyperglycemia (HCC) 06/03/2016   Insulin pump status 03/03/2016   Mixed dyslipidemia 03/03/2016   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) 04/26/2015   Severe recurrent major depressive  disorder with psychotic features (HCC) 04/26/2015   Parkinsonian syndrome associated with symptomatic orthostatic hypotension (HCC) 04/26/2015   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) 02/21/2015   Allergic rhinitis 02/20/2015   Regurgitation of food 08/17/2014   Memory dysfunction 08/17/2014   Lumbar degenerative disc disease 12/20/2013   Greater trochanteric bursitis of right hip 03/23/2013   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 (HCC)    POTS (postural orthostatic tachycardia syndrome) 03/06/2012   Subacute dyskinesia due to drug 10/24/2011   Subacute dyskinesia due to drug 10/24/2011   ADD (attention deficit disorder) 02/23/2011   Insomnia 02/21/2011   Syncope 02/19/2011   Parkinsonism (HCC)    Thyroid nodule    Encounter for well adult exam with abnormal findings 02/15/2011   IBS (irritable bowel syndrome) 02/15/2011   Bipolar  disorder (HCC) 02/15/2011   Brachial neuritis or radiculitis 02/08/2010   Neuropathy 03/20/2009   DRUG DEPENDENCE 03/20/2009   UNSPECIFIED DRUG DEPENDENCE IN REMISSION 07/18/2008   Anxiety state 11/11/2007   Type 1 diabetes mellitus (HCC) 12/02/2006   Hyperlipidemia 12/02/2006   DEPRESSIVE DISORDER, RCR, MODERATE 12/02/2006    PCP: Oliver Barre  REFERRING PROVIDER: [name]  REFERRING DIAG:  [phone] (ICD-10-CM) - Chronic right shoulder pain      THERAPY DIAG:  Acute pain of right shoulder  Stiffness of right shoulder, not elsewhere classified  Subacromial impingement of right shoulder  Subacromial bursitis of right shoulder joint  Rationale for Evaluation and Treatment: Rehabilitation  ONSET DATE: "1 month"  SUBJECTIVE:                                                                                                                                                                                      SUBJECTIVE STATEMENT: Pain is worse but I took my pain killer cocktail so I could get through PT. Nothing in particular making it worse. Exercises do hurt.   Hand dominance: Right  PERTINENT HISTORY: LADERRICKA MOLAND is a 62 y.o. female who presents to [facility] at [facility] today for R shoulder pain. Pt was previously seen by Dr. Denyse Amass on 05/29/21 for LBP.   Today, pt c/o R shoulder pain x 1 month. No injury. Pt locates pain to the anterior aspect of her R shoulder and into the proximal upper arm  Medical history significant for diabetes and Parkinson's disease.   PAIN:  Are you having pain? Yes: NPRS scale: 4/10 Pain location: r shoulder radiates down into deltoid and bicep Pain description: sharp  Aggravating factors: overhead, reaching out Relieving factors: just pain meds  PRECAUTIONS: None  RED FLAGS: None   WEIGHT BEARING RESTRICTIONS: No  FALLS:  Has patient  fallen in last 6 months? No  LIVING ENVIRONMENT: Lives with: lives with  their family and lives with their spouse Lives in: House/apartment Stairs: No Has following equipment at home: None  OCCUPATION: Retired  PLOF: Independent and Independent with basic ADLs  PATIENT GOALS: to be able to reach and not have pain   NEXT MD VISIT:   OBJECTIVE:  Note: Objective measures were completed at Evaluation unless otherwise noted.  DIAGNOSTIC FINDINGS:  Right shoulder Biceps tendon is intact and normal. Subscapularis tendon is intact. Supraspinatus tendon intact without visible tear. Infraspinatus tendon is intact Impression: No acute large retracted rotator cuff tear.   X-ray images right shoulder obtained today personally and independently interpreted Mild glenohumeral and AC DJD.  No acute fractures are visible.  PATIENT SURVEYS:  FOTO 43  COGNITION: Overall cognitive status: Within functional limits for tasks assessed     SENSATION: WFL  POSTURE: Rounded shoulders  UPPER EXTREMITY ROM:   Active ROM Right eval Left eval  Shoulder flexion 160 w/end range pain WNL  Shoulder extension    Shoulder abduction 160 w end range /pain   Shoulder adduction    Shoulder internal rotation WNL WNL  Shoulder external rotation WNL WNL  Elbow flexion    Elbow extension    Wrist flexion    Wrist extension    Wrist ulnar deviation    Wrist radial deviation    Wrist pronation    Wrist supination    (Blank rows = not tested)  UPPER EXTREMITY MMT:  MMT Right eval Left eval  Shoulder flexion 4 with pain 5  Shoulder extension    Shoulder abduction 4 with pain 5  Shoulder adduction    Shoulder internal rotation 5 5  Shoulder external rotation 5 5  Middle trapezius    Lower trapezius    Elbow flexion 5 5  Elbow extension 5 5  Wrist flexion    Wrist extension    Wrist ulnar deviation    Wrist radial deviation    Wrist pronation    Wrist supination    Grip strength (lbs)    (Blank rows = not tested)  SHOULDER SPECIAL TESTS: Impingement  tests: Neer impingement test: positive , Hawkins/Kennedy impingement test: positive , and Painful arc test: positive   Rotator cuff assessment: Empty can test: positive   JOINT MOBILITY TESTING:  Able to achieve full PROM with some end range pain  PALPATION:  No TTP   TODAY'S TREATMENT:                                                                                                                                         DATE: 01/14/23 UBE L1x74min each way  Rows and ext red 2x10 ER/R with red band 2x10  AAROM with 1# WaTE bar flexion, extension, IR 2x10 Ionto patch to R shoulder  KT tape to R shoulder   EVAL  12/31/22   PATIENT EDUCATION: Education details: POC, HEP, shoulder anatomy, education about subacromial impingement and bursitis  Person educated: Patient Education method: Explanation Education comprehension: verbalized understanding  HOME EXERCISE PROGRAM: Access Code: [email or web address] URL: [email or web address] Date: 12/31/2022 Prepared by: Cassie Freer  Exercises - Shoulder External Rotation and Scapular Retraction with Resistance  - 1 x daily - 7 x weekly - 2 sets - 10 reps - Standing Shoulder Row with Anchored Resistance  - 1 x daily - 7 x weekly - 2 sets - 10 reps - Shoulder extension with resistance - Neutral  - 1 x daily - 7 x weekly - 2 sets - 10 reps - Doorway Pec Stretch at 90 Degrees Abduction  - 1 x daily - 7 x weekly - 2 reps - 15 hold  ASSESSMENT:  CLINICAL IMPRESSION: Patient is a 62 y.o. female who was seen today for physical therapy treatment for R shoulder pain. She reports her pain is worse and some exercises have been painful. Was advised to modify and take out the ones that increase her pain. Ended session with ionto patch as well as some KT taping to help with possible impingement or subacromial bursitis. She has difficulty holding her arm up and the pain is more intense when bringing it back down to neutral position in a controlled  fashion. She demonstrates decent strength overall. Patient will benefit from skilled PT to address her R shoulder pain to be able to complete ADLs, overhead activities, and reach without difficulty.   OBJECTIVE IMPAIRMENTS: decreased ROM, decreased strength, impaired UE functional use, and pain.   ACTIVITY LIMITATIONS: carrying, lifting, and reach over head  PARTICIPATION LIMITATIONS: cleaning, laundry, community activity, and yard work  [facility] POTENTIAL: Good  CLINICAL DECISION MAKING: Stable/uncomplicated  EVALUATION COMPLEXITY: Low   GOALS: Goals reviewed with patient? Yes  SHORT TERM GOALS: Target date: 02/11/23  Patient will be independent with initial HEP.  Baseline: given 12/31/22 Goal status: INITIAL  LONG TERM GOALS: Target date: 03/25/23  Patient will be independent with advanced/ongoing HEP to improve outcomes and carryover.  Goal status: INITIAL  2.  Patient will report 75% improvement in R shoulder pain to improve QOL.  Baseline: 6-7/10  Goal status: INITIAL  3.  Patient will be able to complete overhead activities and reach without pain  Baseline: + test between 60-120d, pain with overhead tasks Goal status: INITIAL  4.  Patient will report 46 or better on FOTO to demonstrate improved functional ability.  Baseline: 43 Goal status: INITIAL  PLAN:  PT FREQUENCY: 1x/week  PT DURATION: 12 weeks  PLANNED INTERVENTIONS: 97110-Therapeutic exercises, 97530- Therapeutic activity, 97112- Neuromuscular re-education, 97535- Self Care, 54098- Manual therapy, 97014- Electrical stimulation (unattended), 97016- Vasopneumatic device, [id number]- Ultrasound, 11914- Ionotophoresis 4mg /ml Dexamethasone, Patient/Family education, Taping, Dry Needling, Joint mobilization, Cryotherapy, and Moist heat  PLAN FOR NEXT SESSION: shoulder and postural strengthening, STW/manual therapy and can try taping or ionto    [employer], PT 01/14/2023, 3:20 PM

## 2023-01-14 ENCOUNTER — Ambulatory Visit: Payer: Medicare PPO | Attending: Family Medicine

## 2023-01-14 DIAGNOSIS — M25611 Stiffness of right shoulder, not elsewhere classified: Secondary | ICD-10-CM | POA: Insufficient documentation

## 2023-01-14 DIAGNOSIS — M7541 Impingement syndrome of right shoulder: Secondary | ICD-10-CM | POA: Diagnosis not present

## 2023-01-14 DIAGNOSIS — M7551 Bursitis of right shoulder: Secondary | ICD-10-CM | POA: Insufficient documentation

## 2023-01-14 DIAGNOSIS — M25511 Pain in right shoulder: Secondary | ICD-10-CM | POA: Diagnosis not present

## 2023-01-16 DIAGNOSIS — E103293 Type 1 diabetes mellitus with mild nonproliferative diabetic retinopathy without macular edema, bilateral: Secondary | ICD-10-CM | POA: Diagnosis not present

## 2023-01-17 DIAGNOSIS — Z1231 Encounter for screening mammogram for malignant neoplasm of breast: Secondary | ICD-10-CM | POA: Diagnosis not present

## 2023-01-17 LAB — HM MAMMOGRAPHY

## 2023-01-19 NOTE — Therapy (Signed)
OUTPATIENT PHYSICAL THERAPY SHOULDER TREATMENT   Patient Name: Lynn Barnes MRN: [id number] DOB:[birth year], 62 y.o., female Today's Date: 01/19/2023  END OF SESSION:     Past Medical History:  Diagnosis Date   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 [facility])    opium   Addiction, opium (HCC)    Allergy    spring   Anxiety    ADHD CHRONIC ANXIETY   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 (HCC)    Bipolar disorder (HCC) 02/15/2011   Cervical spondylosis without myelopathy    Depression    Depression    Diabetes mellitus    sees Dr Altheimer/endo   Dysautonomia (HCC) 10/24/2011   Dyskinesia     subacute,due to drugs   Dyslipidemia    History of nuclear stress test    Myoview 11/16: EF 81%, no ischemia; Low Risk   Hyperlipidemia    IBS (irritable bowel syndrome) 02/15/2011   Insomnia 02/21/2011   Multinodular thyroid    with large dominant solid nodule with calcifications in the lower left pole benign by FNA in 1/12 and stable by repeat ulltrasound in 4/13   Neuropathy    with paresthesias in feet   Orthostatic hypotension    Orthostatic hypotension    Parkinsonism (HCC)    due to prolonged prozac/risperdl for yrs/Dr Dohmeir   Parkinsonism (HCC)    POTS (postural orthostatic tachycardia syndrome)    Radiculopathy    cervical   Resting tremor    Rotator cuff syndrome of left shoulder    S/P partial hysterectomy    Spondylosis without myelopathy    Subacute dyskinesia due to drugs(333.85)    dr dohmeier/neuro   Tachycardia    syndrome   Therapy    on therapy by psychiatrist using sebutex    Thyroid nodule    s/p biopsy - benign approx jan 2012   Tremor    Past Surgical History:  Procedure Laterality Date   ACROMIOPLASTY Left 2002   Dr Darrelyn Hillock   BREAST EXCISIONAL BIOPSY Right    benign   BREAST LUMPECTOMY Right    herniated disc cervical spine     lower back  1992   OOPHORECTOMY     PARTIAL HYSTERECTOMY     SHOULDER SURGERY Left    SPINE SURGERY  feb 2012   c5-6 fusion, Dr  Shon Baton   THYROID LOBECTOMY  11/11   nodule biopsy, benign   Patient Active Problem List   Diagnosis Date Noted   Acute URI 11/06/2022   Acquired deformity of lower leg 10/10/2022   HTN (hypertension) 11/09/2021   Hypoglycemia unawareness associated with type 1 diabetes mellitus (HCC) 05/24/2021   Blister of right heel 09/07/2020   Uses self-applied continuous glucose monitoring device 04/05/2020   Posterior neck pain 09/23/2018   Parkinsonism with orthostatic hypotension (HCC) 01/05/2018   Type 1 diabetes mellitus with hyperglycemia (HCC) 06/03/2016   Insulin pump status 03/03/2016   Mixed dyslipidemia 03/03/2016   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) 04/26/2015   Severe recurrent major depressive disorder with psychotic features (HCC) 04/26/2015   Parkinsonian syndrome associated with symptomatic orthostatic hypotension (HCC) 04/26/2015   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) 02/21/2015   Allergic rhinitis 02/20/2015   Regurgitation of food 08/17/2014   Memory dysfunction 08/17/2014   Lumbar degenerative disc disease 12/20/2013   Greater trochanteric bursitis of right hip 03/23/2013   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 (HCC)    POTS (postural orthostatic tachycardia syndrome) 03/06/2012   Subacute dyskinesia due to  drug 10/24/2011   Subacute dyskinesia due to drug 10/24/2011   ADD (attention deficit disorder) 02/23/2011   Insomnia 02/21/2011   Syncope 02/19/2011   Parkinsonism (HCC)    Thyroid nodule    Encounter for well adult exam with abnormal findings 02/15/2011   IBS (irritable bowel syndrome) 02/15/2011   Bipolar disorder (HCC) 02/15/2011   Brachial neuritis or radiculitis 02/08/2010   Neuropathy 03/20/2009   DRUG DEPENDENCE 03/20/2009   UNSPECIFIED DRUG DEPENDENCE IN REMISSION 07/18/2008   Anxiety state 11/11/2007   Type 1 diabetes mellitus (HCC) 12/02/2006   Hyperlipidemia 12/02/2006   DEPRESSIVE DISORDER, RCR, MODERATE 12/02/2006     PCP: Oliver Barre  REFERRING PROVIDER: [name]  REFERRING DIAG:  [phone] (ICD-10-CM) - Chronic right shoulder pain      THERAPY DIAG:  No diagnosis found.  Rationale for Evaluation and Treatment: Rehabilitation  ONSET DATE: "1 month"  SUBJECTIVE:                                                                                                                                                                                      SUBJECTIVE STATEMENT: Pain is worse but I took my pain killer cocktail so I could get through PT. Nothing in particular making it worse. Exercises do hurt.   Hand dominance: Right  PERTINENT HISTORY: Lynn Barnes is a 62 y.o. female who presents to [facility] at [facility] today for R shoulder pain. Pt was previously seen by Dr. Denyse Amass on 05/29/21 for LBP.   Today, pt c/o R shoulder pain x 1 month. No injury. Pt locates pain to the anterior aspect of her R shoulder and into the proximal upper arm  Medical history significant for diabetes and Parkinson's disease.   PAIN:  Are you having pain? Yes: NPRS scale: 4/10 Pain location: r shoulder radiates down into deltoid and bicep Pain description: sharp  Aggravating factors: overhead, reaching out Relieving factors: just pain meds  PRECAUTIONS: None  RED FLAGS: None   WEIGHT BEARING RESTRICTIONS: No  FALLS:  Has patient fallen in last 6 months? No  LIVING ENVIRONMENT: Lives with: lives with their family and lives with their spouse Lives in: House/apartment Stairs: No Has following equipment at home: None  OCCUPATION: Retired  PLOF: Independent and Independent with basic ADLs  PATIENT GOALS: to be able to reach and not have pain   NEXT MD VISIT:   OBJECTIVE:  Note: Objective measures were completed at Evaluation unless otherwise noted.  DIAGNOSTIC FINDINGS:  Right shoulder Biceps tendon is intact and normal. Subscapularis tendon is intact. Supraspinatus  tendon intact without visible tear. Infraspinatus tendon is intact Impression: No acute  large retracted rotator cuff tear.   X-ray images right shoulder obtained today personally and independently interpreted Mild glenohumeral and AC DJD.  No acute fractures are visible.  PATIENT SURVEYS:  FOTO 43  COGNITION: Overall cognitive status: Within functional limits for tasks assessed     SENSATION: WFL  POSTURE: Rounded shoulders  UPPER EXTREMITY ROM:   Active ROM Right eval Left eval  Shoulder flexion 160 w/end range pain WNL  Shoulder extension    Shoulder abduction 160 w end range /pain   Shoulder adduction    Shoulder internal rotation WNL WNL  Shoulder external rotation WNL WNL  Elbow flexion    Elbow extension    Wrist flexion    Wrist extension    Wrist ulnar deviation    Wrist radial deviation    Wrist pronation    Wrist supination    (Blank rows = not tested)  UPPER EXTREMITY MMT:  MMT Right eval Left eval  Shoulder flexion 4 with pain 5  Shoulder extension    Shoulder abduction 4 with pain 5  Shoulder adduction    Shoulder internal rotation 5 5  Shoulder external rotation 5 5  Middle trapezius    Lower trapezius    Elbow flexion 5 5  Elbow extension 5 5  Wrist flexion    Wrist extension    Wrist ulnar deviation    Wrist radial deviation    Wrist pronation    Wrist supination    Grip strength (lbs)    (Blank rows = not tested)  SHOULDER SPECIAL TESTS: Impingement tests: Neer impingement test: positive , Hawkins/Kennedy impingement test: positive , and Painful arc test: positive   Rotator cuff assessment: Empty can test: positive   JOINT MOBILITY TESTING:  Able to achieve full PROM with some end range pain  PALPATION:  No TTP   TODAY'S TREATMENT:                                                                                                                                         DATE: 01/21/23 UBE Wall slides  Shoulder ext 5#  Seated  row Lat pull down  Scapular retraction  Diagonals  PROM to R shoulder   01/14/23 UBE L1x79min each way  Rows and ext red 2x10 ER/R with red band 2x10  AAROM with 1# WaTE bar flexion, extension, IR 2x10 Ionto patch to R shoulder  KT tape to R shoulder   EVAL 12/31/22   PATIENT EDUCATION: Education details: POC, HEP, shoulder anatomy, education about subacromial impingement and bursitis  Person educated: Patient Education method: Explanation Education comprehension: verbalized understanding  HOME EXERCISE PROGRAM: Access Code: [email or web address] URL: [email or web address] Date: 12/31/2022 Prepared by: Cassie Freer  Exercises - Shoulder External Rotation and Scapular Retraction with Resistance  - 1 x daily - 7 x weekly - 2 sets - 10 reps - Standing Shoulder Row with Anchored Resistance  -  1 x daily - 7 x weekly - 2 sets - 10 reps - Shoulder extension with resistance - Neutral  - 1 x daily - 7 x weekly - 2 sets - 10 reps - Doorway Pec Stretch at 90 Degrees Abduction  - 1 x daily - 7 x weekly - 2 reps - 15 hold  ASSESSMENT:  CLINICAL IMPRESSION: Patient is a 62 y.o. female who was seen today for physical therapy treatment for R shoulder pain. She reports her pain is worse and some exercises have been painful. Was advised to modify and take out the ones that increase her pain. Ended session with ionto patch as well as some KT taping to help with possible impingement or subacromial bursitis. She has difficulty holding her arm up and the pain is more intense when bringing it back down to neutral position in a controlled fashion. She demonstrates decent strength overall. Patient will benefit from skilled PT to address her R shoulder pain to be able to complete ADLs, overhead activities, and reach without difficulty.   OBJECTIVE IMPAIRMENTS: decreased ROM, decreased strength, impaired UE functional use, and pain.   ACTIVITY LIMITATIONS: carrying, lifting, and reach over  head  PARTICIPATION LIMITATIONS: cleaning, laundry, community activity, and yard work  [facility] POTENTIAL: Good  CLINICAL DECISION MAKING: Stable/uncomplicated  EVALUATION COMPLEXITY: Low   GOALS: Goals reviewed with patient? Yes  SHORT TERM GOALS: Target date: 02/11/23  Patient will be independent with initial HEP.  Baseline: given 12/31/22 Goal status: INITIAL  LONG TERM GOALS: Target date: 03/25/23  Patient will be independent with advanced/ongoing HEP to improve outcomes and carryover.  Goal status: INITIAL  2.  Patient will report 75% improvement in R shoulder pain to improve QOL.  Baseline: 6-7/10  Goal status: INITIAL  3.  Patient will be able to complete overhead activities and reach without pain  Baseline: + test between 60-120d, pain with overhead tasks Goal status: INITIAL  4.  Patient will report 71 or better on FOTO to demonstrate improved functional ability.  Baseline: 43 Goal status: INITIAL  PLAN:  PT FREQUENCY: 1x/week  PT DURATION: 12 weeks  PLANNED INTERVENTIONS: 97110-Therapeutic exercises, 97530- Therapeutic activity, 97112- Neuromuscular re-education, 97535- Self Care, 16109- Manual therapy, 97014- Electrical stimulation (unattended), 97016- Vasopneumatic device, 97035- Ultrasound, 60454- Ionotophoresis 4mg /ml Dexamethasone, Patient/Family education, Taping, Dry Needling, Joint mobilization, Cryotherapy, and Moist heat  PLAN FOR NEXT SESSION: shoulder and postural strengthening, STW/manual therapy and can try taping or ionto    Cassie Freer, PT 01/19/2023, 7:56 AM

## 2023-01-21 ENCOUNTER — Ambulatory Visit: Payer: Medicare PPO

## 2023-01-21 DIAGNOSIS — M7551 Bursitis of right shoulder: Secondary | ICD-10-CM

## 2023-01-21 DIAGNOSIS — M25611 Stiffness of right shoulder, not elsewhere classified: Secondary | ICD-10-CM

## 2023-01-21 DIAGNOSIS — M7541 Impingement syndrome of right shoulder: Secondary | ICD-10-CM

## 2023-01-21 DIAGNOSIS — M25511 Pain in right shoulder: Secondary | ICD-10-CM | POA: Diagnosis not present

## 2023-01-26 ENCOUNTER — Telehealth: Payer: Self-pay | Admitting: Adult Health

## 2023-01-26 MED ORDER — CARBIDOPA-LEVODOPA ER 50-200 MG PO TBCR: 1.0000 | EXTENDED_RELEASE_TABLET | Freq: Two times a day (BID) | ORAL | 0 refills | Status: DC

## 2023-01-26 NOTE — Telephone Encounter (Signed)
Pt requesting refill of carbidopa-levodopa (SINEMET CR) 50-200 MG tablet  Send to  [facility] [phone]

## 2023-01-26 NOTE — Telephone Encounter (Signed)
done

## 2023-01-27 DIAGNOSIS — F3189 Other bipolar disorder: Secondary | ICD-10-CM | POA: Diagnosis not present

## 2023-01-27 DIAGNOSIS — F5105 Insomnia due to other mental disorder: Secondary | ICD-10-CM | POA: Diagnosis not present

## 2023-01-28 ENCOUNTER — Ambulatory Visit: Payer: Medicare PPO | Admitting: Family Medicine

## 2023-01-28 ENCOUNTER — Ambulatory Visit: Payer: Medicare PPO | Admitting: Physical Therapy

## 2023-01-28 ENCOUNTER — Other Ambulatory Visit: Payer: Self-pay

## 2023-01-28 ENCOUNTER — Encounter: Payer: Self-pay | Admitting: Family Medicine

## 2023-01-28 VITALS — BP 150/92 | HR 75 | Wt 157.0 lb

## 2023-01-28 DIAGNOSIS — M25511 Pain in right shoulder: Secondary | ICD-10-CM

## 2023-01-28 DIAGNOSIS — E1065 Type 1 diabetes mellitus with hyperglycemia: Secondary | ICD-10-CM

## 2023-01-28 DIAGNOSIS — G8929 Other chronic pain: Secondary | ICD-10-CM | POA: Diagnosis not present

## 2023-01-28 NOTE — Patient Instructions (Addendum)
Thank you for coming in today.  You received an injection today. Seek immediate medical attention if the joint becomes red, extremely painful, or is oozing fluid.  Keep an eye on your blood sugar, OK to take a little bit of extra insulin if needed.   Continue with physical therapy.  Check back as needed.

## 2023-01-28 NOTE — Progress Notes (Signed)
   I, Stevenson Clinch, CMA acting as a scribe for Lynn Graham, MD.  Lynn Barnes is a 62 y.o. female who presents to [facility] at [facility] today for f/u R shoulder pain. Pt was last seen by Dr. Denyse Amass on 12/17/22 and was referred to PT, completing 3 visits.  Today, pt reports minimal change in sx with PT so far. Compliant with HEP provided by PT. Denies new or worsening sx since last visit.   Dx imaging: 12/17/22 R shoulder XR  Pertinent review of systems: No fevers or chills  Relevant historical information: Type 1 diabetes   Exam:  BP (!) 150/92   Pulse 75   Wt 157 lb (71.2 kg)   SpO2 99%   BMI 24.59 kg/m  General: Well Developed, well nourished, and in no acute distress.   MSK: Right shoulder normal-appearing normal motion pain with abduction.    Lab and Radiology Results  Procedure: Real-time Ultrasound Guided Injection of right shoulder glenohumeral joint posterior approach Device: Philips Affiniti 50G/GE Logiq Images permanently stored and available for review in PACS Verbal informed consent obtained.  Discussed risks and benefits of procedure. Warned about infection, bleeding, hyperglycemia damage to structures among others. Patient expresses understanding and agreement Time-out conducted.   Noted no overlying erythema, induration, or other signs of local infection.   Skin prepped in a sterile fashion.   Local anesthesia: Topical Ethyl chloride.   With sterile technique and under real time ultrasound guidance: Right shoulder glenohumeral joint posterior approach injected into shoulder joint. Fluid seen entering the joint capsule.   Completed without difficulty   Pain immediately resolved suggesting accurate placement of the medication.   Advised to call if fevers/chills, erythema, induration, drainage, or persistent bleeding.   Images permanently stored and available for review in the ultrasound unit.  Impression: Technically successful  ultrasound guided injection.         Assessment and Plan: 62 y.o. female with right shoulder pain due to DJD and bursitis/tendinitis.  Glenohumeral joint injection today were quite well immediately indicating that the source of her pain.  Plan to continue PT.  Check back as needed.  We talked about expected blood sugar following today's injection.  She does adjust her insulin based on her blood sugar.  She could consider increasing her basal insulin by 3 units if she is notices elevated blood sugar and taking more short acting insulin as well.  PDMP not reviewed this encounter. Orders Placed This Encounter  Procedures   [place] LIMITED JOINT SPACE STRUCTURES UP RIGHT(NO LINKED CHARGES)    Reason for Exam (SYMPTOM  OR DIAGNOSIS REQUIRED):   rt shoulder pain    Preferred imaging location?:   [facility]   No orders of the defined types were placed in this encounter.    Discussed warning signs or symptoms. Please see discharge instructions. Patient expresses understanding.   The above documentation has been reviewed and is accurate and complete Lynn Barnes, M.D.

## 2023-02-09 ENCOUNTER — Ambulatory Visit: Payer: Medicare PPO

## 2023-02-12 DIAGNOSIS — E042 Nontoxic multinodular goiter: Secondary | ICD-10-CM | POA: Diagnosis not present

## 2023-03-31 ENCOUNTER — Other Ambulatory Visit: Payer: Self-pay | Admitting: Adult Health

## 2023-03-31 ENCOUNTER — Ambulatory Visit: Payer: Medicare PPO | Admitting: Internal Medicine

## 2023-04-08 ENCOUNTER — Other Ambulatory Visit: Payer: Self-pay | Admitting: Internal Medicine

## 2023-04-08 DIAGNOSIS — E1065 Type 1 diabetes mellitus with hyperglycemia: Secondary | ICD-10-CM | POA: Diagnosis not present

## 2023-04-15 ENCOUNTER — Other Ambulatory Visit: Payer: Self-pay | Admitting: Internal Medicine

## 2023-04-16 ENCOUNTER — Ambulatory Visit: Payer: Medicare PPO | Admitting: Neurology

## 2023-04-16 DIAGNOSIS — E103293 Type 1 diabetes mellitus with mild nonproliferative diabetic retinopathy without macular edema, bilateral: Secondary | ICD-10-CM | POA: Diagnosis not present

## 2023-04-17 ENCOUNTER — Other Ambulatory Visit: Payer: Self-pay | Admitting: Internal Medicine

## 2023-04-17 NOTE — Telephone Encounter (Signed)
 Copied from CRM [id number]. Topic: Clinical - Medication Refill >> Apr 17, 2023 11:49 AM Aletta Edouard wrote: Most Recent Primary Care Visit:  Provider: Corwin Levins  Department: [facility]  Visit Type: OFFICE VISIT  Date: 11/04/2022  Medication: traMADol (ULTRAM) 50 MG tablet   Has the patient contacted their pharmacy? No (Agent: If no, request that the patient contact the pharmacy for the refill. If patient does not wish to contact the pharmacy document the reason why and proceed with request.) (Agent: If yes, when and what did the pharmacy advise?)  Is this the correct pharmacy for this prescription? Yes If no, delete pharmacy and type the correct one.  This is the patient's preferred pharmacy:  [facility] [address] [place], [place] - [address] [place] [facility] [address] [place]. [place] [address] Phone: [phone] Fax: [phone]   Has the prescription been filled recently? No  Is the patient out of the medication? Yes  Has the patient been seen for an appointment in the last year OR does the patient have an upcoming appointment? Yes  Can we respond through MyChart? No  Agent: Please be advised that Rx refills may take up to 3 business days. We ask that you follow-up with your pharmacy.

## 2023-04-19 ENCOUNTER — Other Ambulatory Visit: Payer: Self-pay | Admitting: Internal Medicine

## 2023-04-20 ENCOUNTER — Other Ambulatory Visit: Payer: Self-pay | Admitting: Adult Health

## 2023-04-21 DIAGNOSIS — F5105 Insomnia due to other mental disorder: Secondary | ICD-10-CM | POA: Diagnosis not present

## 2023-04-21 DIAGNOSIS — F3189 Other bipolar disorder: Secondary | ICD-10-CM | POA: Diagnosis not present

## 2023-04-24 ENCOUNTER — Ambulatory Visit: Payer: Medicare PPO | Admitting: Physician Assistant

## 2023-05-11 NOTE — Progress Notes (Unsigned)
 Cardiology Office Note    Patient Name: Lynn Barnes Date of Encounter: 05/11/2023  Primary Care Provider:  Corwin Levins, MD Primary Cardiologist:  Dietrich Pates, MD Primary Electrophysiologist: None   Past Medical History    Past Medical History:  Diagnosis Date   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 [facility])    opium   Addiction, opium (HCC)    Allergy    spring   Anxiety    ADHD CHRONIC ANXIETY   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 [facility])    Bipolar disorder (HCC) 02/15/2011   Cervical spondylosis without myelopathy    Depression    Depression    Diabetes mellitus    sees Dr Altheimer/endo   Dysautonomia (HCC) 10/24/2011   Dyskinesia     subacute,due to drugs   Dyslipidemia    History of nuclear stress test    Myoview 11/16: EF 81%, no ischemia; Low Risk   Hyperlipidemia    IBS (irritable bowel syndrome) 02/15/2011   Insomnia 02/21/2011   Multinodular thyroid    with large dominant solid nodule with calcifications in the lower left pole benign by FNA in 1/12 and stable by repeat ulltrasound in 4/13   Neuropathy    with paresthesias in feet   Orthostatic hypotension    Orthostatic hypotension    Parkinsonism (HCC)    due to prolonged prozac/risperdl for yrs/Dr Dohmeir   Parkinsonism (HCC)    POTS (postural orthostatic tachycardia syndrome)    Radiculopathy    cervical   Resting tremor    Rotator cuff syndrome of left shoulder    S/P partial hysterectomy    Spondylosis without myelopathy    Subacute dyskinesia due to drugs(333.85)    dr dohmeier/neuro   Tachycardia    syndrome   Therapy    on therapy by psychiatrist using sebutex    Thyroid nodule    s/p biopsy - benign approx jan 2012   Tremor     History of Present Illness  Lynn Barnes is a 63 y.o. female with a PMH of hyperlipidemia, DM type II, Parkinson's and autonomic dysfunction who presents today for annual follow-up.  Lynn Barnes was seen initially by Dr. Tenny Craw in 2013 for evaluation of orthostatic hypotension.   She was started on midodrine and was treated with pindolol but is currently on Florinef 0.5 daily.  2D echo on 3/214 that showed EF of 60% with no RWMA and no significant valve abnormalities.  She underwent a Lexiscan Myoview and 2016 plaint of shortness of breath that was normal.  She was last seen by Dr. Tenny Craw on 03/28/2022 and reported doing well with no syncope or severe dizziness.  She also denied any significant palpitations and breathing was good.  She was started on ezetimibe due to elevated LDL.  Lynn Barnes presents today for annual follow-up.  She reports since her previous visit experiencing orthostatic symptoms a couple of times a week, particularly after sitting for long periods. She takes Florinef at a dose of 0.1 mg daily, which helps manage her symptoms. She also uses compression stockings frequently, which alleviate her orthostatic symptoms. She monitors her blood pressure at home, with recent readings of 111/68 mmHg. Her blood pressure was previously noted to be on the higher side during a visit in November, but no changes were made to her medication regimen at that time. She experiences 'white coat hypertension' during medical visits, with a recent in-office reading of 142/84 mmHg. She takes pindolol and lisinopril as part of her medication  regimen. She is currently taking ezetimibe for cholesterol management, which was initiated due to elevated LDL levels. Her cholesterol levels were last checked in August and were reported to be good. No new cardiac complaints, palpitations, skipped beats, chest pain, or shortness of breath. No swelling in her legs. She has a history of a benign thyroid biopsy performed in October. Her blood sugars have been stable, although she occasionally uses sweet tea to manage low levels. She is on insulin therapy. She is also under the care of a sports medicine doctor for a shoulder issue. Patient denies chest pain, palpitations, dyspnea, PND, orthopnea, nausea,  vomiting, dizziness, syncope, edema, weight gain, or early satiety.  Discussed the use of AI scribe software for clinical note transcription with the patient, who gave verbal consent to proceed.  History of Present Illness   Review of Systems  Please see the history of present illness.    All other systems reviewed and are otherwise negative except as noted above.  Physical Exam    Wt Readings from Last 3 Encounters:  01/28/23 157 lb (71.2 kg)  12/17/22 150 lb (68 kg)  11/04/22 149 lb (67.6 kg)   NW:GNFAO were no vitals filed for this visit.,There is no height or weight on file to calculate BMI. GEN: Well nourished, well developed in no acute distress Neck: No JVD; No carotid bruits Pulmonary: Clear to auscultation without rales, wheezing or rhonchi  Cardiovascular: Normal rate. Regular rhythm. Normal S1. Normal S2.   Murmurs: There is no murmur.  ABDOMEN: Soft, non-tender, non-distended EXTREMITIES:  No edema; No deformity   EKG/LABS/ Recent Cardiac Studies   ECG personally reviewed by me today -sinus rhythm with left axis deviation and rate of 72 bpm with no acute changes consistent with previous EKG.  Risk Assessment/Calculations:          Lab Results  Component Value Date   WBC 6.4 10/10/2022   HGB 13.4 10/10/2022   HCT 39.8 10/10/2022   MCV 96.9 10/10/2022   PLT 315.0 10/10/2022   Lab Results  Component Value Date   CREATININE 0.84 10/10/2022   BUN 18 10/10/2022   NA 134 (L) 10/10/2022   K 4.1 10/10/2022   CL 98 10/10/2022   CO2 28 10/10/2022   Lab Results  Component Value Date   CHOL 126 10/10/2022   HDL 46.60 10/10/2022   LDLCALC 66 10/10/2022   TRIG 70.0 10/10/2022   CHOLHDL 3 10/10/2022    Lab Results  Component Value Date   HGBA1C 7.9 (H) 10/10/2022   Assessment & Plan    1.  Autonomic dysfunction: -Symptoms have decreased compared to previous evaluations. - Continue Florinef 0.5 mg daily  - Use compression stockings regularly - Perform  heel pumps before standing after sitting for long periods  2.  History of Parkinson's: -Currently followed by neurology -Continue current treatment plan per neurology.  3.  Hyperlipidemia: -Patient's last LDL cholesterol was improved at 66 -Continue ezetimibe 10 mg daily and Lipitor 40 mg daily  4.  DM type II: -Patient's last A1c was 7.9 -Continue current treatment plan per PCP.  Disposition: Follow-up with Dietrich Pates, MD or APP in 12 months   Signed, Napoleon Form, Leodis Rains, NP 05/11/2023, 7:44 AM [facility]

## 2023-05-12 ENCOUNTER — Ambulatory Visit: Attending: Nurse Practitioner | Admitting: Nurse Practitioner

## 2023-05-12 ENCOUNTER — Encounter: Payer: Self-pay | Admitting: Nurse Practitioner

## 2023-05-12 VITALS — BP 142/82 | HR 72 | Ht 67.0 in | Wt 161.4 lb

## 2023-05-12 DIAGNOSIS — I951 Orthostatic hypotension: Secondary | ICD-10-CM | POA: Diagnosis not present

## 2023-05-12 DIAGNOSIS — G909 Disorder of the autonomic nervous system, unspecified: Secondary | ICD-10-CM | POA: Diagnosis not present

## 2023-05-12 DIAGNOSIS — E785 Hyperlipidemia, unspecified: Secondary | ICD-10-CM

## 2023-05-12 DIAGNOSIS — Z131 Encounter for screening for diabetes mellitus: Secondary | ICD-10-CM

## 2023-05-12 DIAGNOSIS — G20C Parkinsonism, unspecified: Secondary | ICD-10-CM

## 2023-05-13 NOTE — Patient Instructions (Signed)
 Medication Instructions:  Your physician recommends that you continue on your current medications as directed. Please refer to the Current Medication list given to you today. *If you need a refill on your cardiac medications before your next appointment, please call your pharmacy*  Lab Work: None ordered If you have labs (blood work) drawn today and your tests are completely normal, you will receive your results only by: MyChart Message (if you have MyChart) OR A paper copy in the mail If you have any lab test that is abnormal or we need to change your treatment, we will call you to review the results.  Testing/Procedures: None ordered  Follow-Up: At [facility], you and your health needs are our priority.  As part of our continuing mission to provide you with exceptional heart care, our providers are all part of one team.  This team includes your primary Cardiologist (physician) and Advanced Practice Providers or APPs (Physician Assistants and Nurse Practitioners) who all work together to provide you with the care you need, when you need it.  Your next appointment:   12 month(s)  Provider:   Dietrich Pates, MD     We recommend signing up for the patient portal called "MyChart".  Sign up information is provided on this After Visit Summary.  MyChart is used to connect with patients for Virtual Visits (Telemedicine).  Patients are able to view lab/test results, encounter notes, upcoming appointments, etc.  Non-urgent messages can be sent to your provider as well.   To learn more about what you can do with MyChart, go to [email or web address].   Other Instructions       1st Floor: - Lobby - Registration  - Pharmacy  - Lab - Cafe  2nd Floor: - PV Lab - Diagnostic Testing (echo, CT, nuclear med)  3rd Floor: - Vacant  4th Floor: - TCTS (cardiothoracic surgery) - AFib Clinic - Structural Heart Clinic - Vascular Surgery  - Vascular Ultrasound  5th Floor: -  HeartCare Cardiology (general and EP) - Clinical Pharmacy for coumadin, hypertension, lipid, weight-loss medications, and med management appointments    Valet parking services will be available as well.

## 2023-05-16 DIAGNOSIS — E103293 Type 1 diabetes mellitus with mild nonproliferative diabetic retinopathy without macular edema, bilateral: Secondary | ICD-10-CM | POA: Diagnosis not present

## 2023-05-18 DIAGNOSIS — H25813 Combined forms of age-related cataract, bilateral: Secondary | ICD-10-CM | POA: Diagnosis not present

## 2023-05-18 DIAGNOSIS — E103293 Type 1 diabetes mellitus with mild nonproliferative diabetic retinopathy without macular edema, bilateral: Secondary | ICD-10-CM | POA: Diagnosis not present

## 2023-05-18 DIAGNOSIS — H40013 Open angle with borderline findings, low risk, bilateral: Secondary | ICD-10-CM | POA: Diagnosis not present

## 2023-05-18 DIAGNOSIS — H04123 Dry eye syndrome of bilateral lacrimal glands: Secondary | ICD-10-CM | POA: Diagnosis not present

## 2023-05-18 LAB — HM DIABETES EYE EXAM

## 2023-06-04 ENCOUNTER — Other Ambulatory Visit: Payer: Self-pay | Admitting: Internal Medicine

## 2023-06-07 ENCOUNTER — Other Ambulatory Visit: Payer: Self-pay | Admitting: Internal Medicine

## 2023-06-15 DIAGNOSIS — E103293 Type 1 diabetes mellitus with mild nonproliferative diabetic retinopathy without macular edema, bilateral: Secondary | ICD-10-CM | POA: Diagnosis not present

## 2023-06-17 ENCOUNTER — Ambulatory Visit

## 2023-06-17 VITALS — Ht 67.0 in | Wt 161.0 lb

## 2023-06-17 DIAGNOSIS — Z0000 Encounter for general adult medical examination without abnormal findings: Secondary | ICD-10-CM

## 2023-06-17 DIAGNOSIS — E1065 Type 1 diabetes mellitus with hyperglycemia: Secondary | ICD-10-CM

## 2023-06-17 NOTE — Progress Notes (Signed)
 Subjective:   Lynn Barnes is a 63 y.o. who presents for a Medicare Wellness preventive visit.  Visit Complete: Virtual I connected with  Lynn Barnes on 06/17/23 by a audio enabled telemedicine application and verified that I am speaking with the correct person using two identifiers.  Patient Location: Home  Provider Location: Home Office  I discussed the limitations of evaluation and management by telemedicine. The patient expressed understanding and agreed to proceed.  Vital Signs: Because this visit was a virtual/telehealth visit, some criteria may be missing or patient reported. Any vitals not documented were not able to be obtained and vitals that have been documented are patient reported.  VideoDeclined- This patient declined Librarian, academic. Therefore the visit was completed with audio only.  Persons Participating in Visit: Patient.  AWV Questionnaire: No: Patient Medicare AWV questionnaire was not completed prior to this visit.  Cardiac Risk Factors include: advanced age (>58men, >17 women);diabetes mellitus;hypertension;Other (see comment);dyslipidemia     Objective:    Today's Vitals   06/17/23 1548 06/17/23 1549  Weight: 161 lb (73 kg)   Height: 5\' 7"  (1.702 m)   PainSc:  5    Body mass index is 25.22 kg/m.     06/17/2023    3:55 PM 08/04/2022   12:46 PM 08/22/2021   11:32 AM 08/21/2020    2:10 PM 06/10/2019   12:16 PM 05/06/2019    3:46 PM 03/23/2018    2:38 PM  Advanced Directives  Does Patient Have a Medical Advance Directive? Yes No Yes Yes No No Yes  Type of Estate agent of [place];Living will  Living will;Healthcare Power of Attorney Living will;Healthcare Power of [employer] Power of [place];Living will  Does patient want to make changes to medical advance directive?   No - Patient declined No - Patient declined     Copy of Healthcare Power of Attorney in Chart? No - copy requested   No - copy requested No - copy requested   No - copy requested  Would patient like information on creating a medical advance directive?  Yes (MAU/Ambulatory/Procedural Areas - Information given)    No - Patient declined     Current Medications (verified) Outpatient Encounter Medications as of 06/17/2023  Medication Sig   atorvastatin  (LIPITOR) 40 MG tablet Take 1 tablet (40 mg total) by mouth daily.   busPIRone (BUSPAR) 10 MG tablet Take 20 mg by mouth 2 (two) times daily.   carbidopa -levodopa  (SINEMET  CR) 50-200 MG tablet TAKE ONE TABLET BY MOUTH TWICE A DAY   ezetimibe  (ZETIA ) 10 MG tablet TAKE ONE TABLET BY MOUTH ONE TIME DAILY   fludrocortisone  (FLORINEF ) 0.1 MG tablet Take 0.5 tablets (50 mcg total) by mouth daily.   gabapentin  (NEURONTIN ) 400 MG capsule Take by mouth.   Glucagon 3 MG/DOSE POWD Place into the nose.   glucose blood test strip 1 each by Other route. 7 times per day   - as instructed   HUMALOG KWIKPEN 100 UNIT/ML KwikPen INJECT BEFORE EACH MEAL WITH 1 UNIT PER 10 GM CARBS PLUS CORRECTIN OF 1 UNIT PER 50 MG   insulin degludec (TRESIBA) 100 UNIT/ML FlexTouch Pen Inject 27 Units into the skin daily.    LATUDA 120 MG TABS Take 1 tablet by mouth at bedtime.   lisinopril  (ZESTRIL ) 20 MG tablet TAKE ONE TABLET BY MOUTH ONE TIME DAILY - TAKE AN EXTRA TABLET BY MOUTH DAILY IF FOR SYSTOLIC BLOOD PRESSURE OVER 165  pindolol  (VISKEN ) 5 MG tablet TAKE ONE TABLET BY MOUTH TWICE A DAY   POTASSIUM CITRATE PO Take 1 capsule by mouth every other day.   risperidone  (RISPERDAL ) 4 MG tablet Take 8 mg by mouth at bedtime.   scopolamine  (TRANSDERM SCOP , 1.5 MG,) 1 MG/3DAYS Place 1 patch (1.5 mg total) onto the skin every 3 (three) days.   traMADol  (ULTRAM ) 50 MG tablet TAKE ONE TABLET BY MOUTH EVERY 6 HOURS AS NEEDED FOR MODERATE PAIN   trihexyphenidyl  (ARTANE ) 2 MG tablet Take 1 tablet (2 mg total) by mouth 3 (three) times daily with meals.   zolpidem  (AMBIEN ) 10 MG tablet Take 10 mg by mouth at  bedtime.   No facility-administered encounter medications on file as of 06/17/2023.    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)    Bipolar disorder (HCC) 02/15/2011   Cervical spondylosis without myelopathy    Depression    Depression    Diabetes mellitus    sees Dr Altheimer/endo   Dysautonomia (HCC) 10/24/2011   Dyskinesia     subacute,due to drugs   Dyslipidemia    History of nuclear stress test    Myoview 11/16: EF 81%, no ischemia; Low Risk   Hyperlipidemia    IBS (irritable bowel syndrome) 02/15/2011   Insomnia 02/21/2011   Multinodular thyroid     with large dominant solid nodule with calcifications in the lower left pole benign by FNA in 1/12 and stable by repeat ulltrasound in 4/13   Neuropathy    with paresthesias in feet   Orthostatic hypotension    Orthostatic hypotension    Parkinsonism (HCC)    due to prolonged prozac/risperdl for yrs/Dr Dohmeir   Parkinsonism (HCC)    POTS (postural orthostatic tachycardia syndrome)    Radiculopathy    cervical   Resting tremor    Rotator cuff syndrome of left shoulder    S/P partial hysterectomy    Spondylosis without myelopathy    Subacute dyskinesia due to drugs(333.85)    dr dohmeier/neuro   Tachycardia    syndrome   Therapy    on therapy by psychiatrist using sebutex    Thyroid  nodule    s/p biopsy - benign approx jan 2012   Tremor    Past Surgical History:  Procedure Laterality Date   ACROMIOPLASTY Left 2002   Dr Dante Dyer   BREAST EXCISIONAL BIOPSY Right    benign   BREAST LUMPECTOMY Right    herniated disc cervical spine     lower back  1992   OOPHORECTOMY     PARTIAL HYSTERECTOMY     SHOULDER SURGERY Left    SPINE SURGERY  03/2010    c5-6 fusion, Dr Vaughn Georges   THYROID  LOBECTOMY  12/2009   nodule biopsy, benign   Family History  Problem Relation Age of Onset   Lung cancer Mother    Heart disease Father    Hypertension Father    Pneumonia Father    Heart disease Paternal Grandmother    ALS Paternal Grandmother    Hypertension Sister    Social History   Socioeconomic History   Marital status: Married    Spouse name: Not on file   Number of  children: 0   Years of education: BA   Highest education level: Not on file  Occupational History   Occupation: RETIRED/TEACHER    Employer: [employer]  Tobacco Use   Smoking status: Never   Smokeless tobacco: Never  Vaping Use   Vaping status: Never Used  Substance and Sexual Activity   Alcohol use: No    Alcohol/week: 0.0 standard drinks of alcohol   Drug use: No   Sexual activity: Never  Other Topics Concern   Not on file  Social History Narrative   Patient is single and lives with my partner Murlean Armour [place]) and also father-in-law.   Patient is disabled.   Patient is right-handed.   Patient has a Probation officer.   Patient drinks one soda every other day.   Social Drivers of Corporate investment banker Strain: Low Risk  (06/17/2023)   Overall Financial Resource Strain (CARDIA)    Difficulty of Paying Living Expenses: Not very hard  Food Insecurity: No Food Insecurity (06/17/2023)   Hunger Vital Sign    Worried About Running Out of Food in the Last Year: Never true    Ran Out of Food in the Last Year: Never true  Transportation Needs: No Transportation Needs (06/17/2023)   PRAPARE - Administrator, Civil Service (Medical): No    Lack of Transportation (Non-Medical): No  Physical Activity: Sufficiently Active (06/17/2023)   Exercise Vital Sign    Days of Exercise per Week: 5 days    Minutes of Exercise per Session: 40 min  Stress: No Stress Concern Present (06/17/2023)   [employer] of Occupational Health - Occupational Stress  Questionnaire    Feeling of Stress : Only a little  Social Connections: Moderately Isolated (06/17/2023)   Social Connection and Isolation Panel [NHANES]    Frequency of Communication with Friends and Family: More than three times a week    Frequency of Social Gatherings with Friends and Family: Twice a week    Attends Religious Services: Never    Database administrator or Organizations: No    Attends Engineer, structural: Never    Marital Status: Married    Tobacco Counseling Counseling given: Not Answered    Clinical Intake:  Pre-visit preparation completed: Yes  Pain : 0-10 Pain Score: 5  Pain Type: Chronic pain Pain Location: Neck ([place]) Pain Onset: More than a month ago Pain Frequency: Intermittent Pain Relieving Factors: Tramadol / Ibprofern  Pain Relieving Factors: Tramadol / Ibprofern  BMI - recorded: 25.22 Nutritional Status: BMI 25 -29 Overweight Nutritional Risks: None Diabetes: Yes CBG done?: Yes CBG resulted in Enter/ Edit results?: No Did pt. bring in CBG monitor from home?: No  Lab Results  Component Value Date   HGBA1C 7.9 (H) 10/10/2022   HGBA1C 8.8 (H) 03/28/2022   HGBA1C 8.0 (H) 10/16/2020     How often do you need to have someone help you when you read instructions, pamphlets, or other written materials from your doctor or pharmacy?: 1 - Never  Interpreter Needed?: No  Information entered by :: Dorrene Bently, RMA   Activities of Daily Living     06/17/2023    3:52 PM 08/04/2022   12:40 PM  In your present state of health, do you have any difficulty performing the following activities:  Hearing? 0 0  Vision? 0 0  Difficulty concentrating or making decisions? 0 1  Walking or climbing stairs? 0 1  Dressing or bathing? 0 0  Doing errands,  shopping? 0 0  Comment Brother in law takes her around   Quarry manager and eating ? N N  Using the Toilet? N N  In the past six months, have you accidently leaked urine? Y N  Comment  sometimes when asleep-per pt   Do you have problems with loss of bowel control? N N  Managing your Medications? N N  Managing your Finances? N N  Housekeeping or managing your Housekeeping? N N    Patient Care Team: Roslyn Coombe, MD as PCP - General (Internal Medicine) Elmyra Haggard, MD as PCP - Cardiology (Cardiology) Dohmeier, Raoul Byes, MD as Consulting Physician (Neurology) Altheimer, Bambi Lever, MD as Referring Physician (Endocrinology) Efren Grapes (Nurse Practitioner) Amedeo Jupiter, MD as Consulting Physician (Ophthalmology)  Indicate any recent Medical Services you may have received from other than [facility] providers in the past year (date may be approximate).     Assessment:   This is a routine wellness examination for [place].  Hearing/Vision screen Hearing Screening - Comments:: Denies hearing difficulties   Vision Screening - Comments:: Wears eyeglasses/    Goals Addressed   None    Depression Screen     06/17/2023    4:00 PM 11/04/2022    3:54 PM 10/10/2022    2:44 PM 08/04/2022   12:45 PM 11/06/2021    1:27 PM 11/06/2021    1:18 PM 08/22/2021   11:35 AM  PHQ 2/9 Scores  PHQ - 2 Score 0 0 0 0 1 2 1   PHQ- 9 Score 2   0  10     Fall Risk     06/17/2023    3:57 PM 11/04/2022    3:53 PM 10/21/2022    4:08 PM 10/10/2022    2:44 PM 08/04/2022   12:47 PM  Fall Risk   Falls in the past year? 0 0 0 0 1  Number falls in past yr: 0 0 0 0 1  Injury with Fall? 0 0 0 0 0  Risk for fall due to : No Fall Risks No Fall Risks  No Fall Risks Impaired balance/gait  Follow up Falls prevention discussed;Falls evaluation completed Falls evaluation completed Falls evaluation completed Falls evaluation completed Falls evaluation completed    MEDICARE RISK AT HOME:  Medicare Risk at Home Any stairs in or around the home?: No If so, are there any without handrails?: No Home free of loose throw rugs in walkways, pet beds, electrical cords, etc?: Yes Adequate lighting in your home to  reduce risk of falls?: Yes Life alert?: No Use of a cane, walker or w/c?: No Grab bars in the bathroom?: Yes Shower chair or bench in shower?: Yes Elevated toilet seat or a handicapped toilet?: Yes  TIMED UP AND GO:  Was the test performed?  No  Cognitive Function: Declined/Normal: No cognitive concerns noted by patient or family. Patient alert, oriented, able to answer questions appropriately and recall recent events. No signs of memory loss or confusion.    08/21/2014    3:26 PM  MMSE - Mini Mental State Exam  Orientation to time 5  Orientation to Place 5  Registration 3  Attention/ Calculation 5  Recall 3  Language- name 2 objects 2  Language- repeat 1  Language- follow 3 step command 2  Language- read & follow direction 1  Write a sentence 1  Copy design 1  Total score 29      10/15/2022    1:48 PM 04/02/2016    3:45  PM 02/21/2015    3:37 PM  Montreal Cognitive Assessment   Visuospatial/ Executive (0/5) 5 5 5   Naming (0/3) 3 3 3   Attention: Read list of digits (0/2) 1 2 2   Attention: Read list of letters (0/1) 1 1 1   Attention: Serial 7 subtraction starting at 100 (0/3) 3 3 3   Language: Repeat phrase (0/2) 2 2 2   Language : Fluency (0/1) 0 0 0  Abstraction (0/2) 2 2 2   Delayed Recall (0/5) 1 2 3   Orientation (0/6) 6 5 5   Total 24 25 26   Adjusted Score (based on education)  25 26      08/04/2022   12:49 PM 08/22/2021   11:39 AM 05/06/2019    3:57 PM  6CIT Screen  What Year? 0 points 0 points 0 points  What month? 0 points 0 points 0 points  What time? 0 points 0 points 0 points  Count back from 20 0 points 0 points 0 points  Months in reverse 0 points 0 points 0 points  Repeat phrase 2 points 0 points 0 points  Total Score 2 points 0 points 0 points    Immunizations Immunization History  Administered Date(s) Administered   Influenza Inj Mdck Quad Pf 10/29/2018, 10/29/2018   Influenza Split 11/11/2007, 04/24/2008, 11/05/2015, 11/13/2017   Influenza Whole  11/11/2007, 04/24/2008   Influenza, Seasonal, Injecte, Preservative Fre 11/10/2012, 10/10/2022   Influenza,inj,Quad PF,6+ Mos 09/27/2013, 10/11/2019, 10/16/2020, 11/06/2021   Influenza-Unspecified 11/10/2012, 09/27/2013, 11/13/2017, 10/29/2018, 10/12/2019   Moderna Sars-Covid-2 Vaccination 05/09/2019, 06/11/2019, 11/11/2019, 08/31/2020   Pneumococcal Conjugate-13 08/21/2016   Pneumococcal Polysaccharide-23 08/24/2017   Pneumococcal-Unspecified 08/24/2017    Screening Tests Health Maintenance  Topic Date Due   DTaP/Tdap/Td (1 - Tdap) Never done   Zoster Vaccines- Shingrix (1 of 2) Never done   Cervical Cancer Screening (HPV/Pap Cotest)  09/06/2010   Pneumococcal Vaccine 20-58 Years old (3 of 3 - PCV20 or PCV21) 08/25/2022   COVID-19 Vaccine (5 - 2024-25 season) 10/12/2022   FOOT EXAM  11/07/2022   HEMOGLOBIN A1C  04/10/2023   Medicare Annual Wellness (AWV)  08/04/2023   INFLUENZA VACCINE  09/11/2023   Diabetic kidney evaluation - eGFR measurement  10/10/2023   Diabetic kidney evaluation - Urine ACR  10/10/2023   OPHTHALMOLOGY EXAM  05/17/2024   MAMMOGRAM  01/16/2025   Colonoscopy  04/14/2027   Hepatitis C Screening  Completed   HIV Screening  Completed   HPV VACCINES  Aged Out   Meningococcal B Vaccine  Aged Out    Health Maintenance  Health Maintenance Due  Topic Date Due   DTaP/Tdap/Td (1 - Tdap) Never done   Zoster Vaccines- Shingrix (1 of 2) Never done   Cervical Cancer Screening (HPV/Pap Cotest)  09/06/2010   Pneumococcal Vaccine 58-64 Years old (3 of 3 - PCV20 or PCV21) 08/25/2022   COVID-19 Vaccine (5 - 2024-25 season) 10/12/2022   FOOT EXAM  11/07/2022   HEMOGLOBIN A1C  04/10/2023   Medicare Annual Wellness (AWV)  08/04/2023   Health Maintenance Items Addressed: See Nurse Notes  Additional Screening:  Vision Screening: Recommended annual ophthalmology exams for early detection of glaucoma and other disorders of the eye.  Dental Screening: Recommended annual  dental exams for proper oral hygiene  Community Resource Referral / Chronic Care Management: CRR required this visit?  No   CCM required this visit?  No     Plan:     I have personally reviewed and noted the following in the patient's chart:  Medical and social history Use of alcohol, tobacco or illicit drugs  Current medications and supplements including opioid prescriptions. Patient is not currently taking opioid prescriptions. Functional ability and status Nutritional status Physical activity Advanced directives List of other physicians Hospitalizations, surgeries, and ER visits in previous 12 months Vitals Screenings to include cognitive, depression, and falls Referrals and appointments  In addition, I have reviewed and discussed with patient certain preventive protocols, quality metrics, and best practice recommendations. A written personalized care plan for preventive services as well as general preventive health recommendations were provided to patient.     Demauri Advincula L Bernyce Brimley, CMA   06/17/2023   After Visit Summary: (MyChart) Due to this being a telephonic visit, the after visit summary with patients personalized plan was offered to patient via MyChart   Notes: Please refer to Routing Comments.

## 2023-06-17 NOTE — Patient Instructions (Signed)
 Lynn Barnes , Thank you for taking time to come for your Medicare Wellness Visit. I appreciate your ongoing commitment to your health goals. Please review the following plan we discussed and let me know if I can assist you in the future.   Referrals/Orders/Follow-Ups/Clinician Recommendations: It was nice talking with you today.  Aim for 30 minutes of exercise or brisk walking, 6-8 glasses of water, and 5 servings of fruits and vegetables each day. You are due for a tetanus vaccine and a pneumonia vaccine.  You are also due for a foot exam and a A1C check, which will be done during your next office visit.  This is a list of the screening recommended for you and due dates:  Health Maintenance  Topic Date Due   DTaP/Tdap/Td vaccine (1 - Tdap) Never done   Zoster (Shingles) Vaccine (1 of 2) Never done   Pap with HPV screening  09/06/2010   Pneumococcal Vaccination (3 of 3 - PCV20 or PCV21) 08/25/2022   COVID-19 Vaccine (5 - 2024-25 season) 10/12/2022   Complete foot exam   11/07/2022   Hemoglobin A1C  04/10/2023   Medicare Annual Wellness Visit  08/04/2023   Flu Shot  09/11/2023   Yearly kidney function blood test for diabetes  10/10/2023   Yearly kidney health urinalysis for diabetes  10/10/2023   Eye exam for diabetics  05/17/2024   Mammogram  01/16/2025   Colon Cancer Screening  04/14/2027   Hepatitis C Screening  Completed   HIV Screening  Completed   HPV Vaccine  Aged Out   Meningitis B Vaccine  Aged Out    Advanced directives: (Copy Requested) Please bring a copy of your health care power of attorney and living will to the office to be added to your chart at your convenience. You can mail to [facility] [address] [place], [place] [address] or email to [email or web address]  Next Medicare Annual Wellness Visit scheduled for next year: Yes  Have you seen your provider in the last 6 months (3 months if uncontrolled diabetes)? No.

## 2023-06-19 ENCOUNTER — Telehealth: Payer: Self-pay | Admitting: Adult Health

## 2023-06-19 NOTE — Telephone Encounter (Signed)
 r/s appointment for work

## 2023-06-22 ENCOUNTER — Ambulatory Visit: Admitting: Neurology

## 2023-06-24 ENCOUNTER — Telehealth: Payer: Self-pay

## 2023-06-24 NOTE — Telephone Encounter (Signed)
 This patient is appearing on a report for being at risk of failing the adherence measure for hypertension (ACEi/ARB) medications this calendar year.   Medication: lisinopril  20 mg Last fill date: 04/20/13 for 15 day supply  Patient reports having a good supply of lisinopril  at home from previous fills. She states her directions are to take one lisinopril  daily and to take another if her BP is > 135/80. Patient reports taking 2 lisinopril  tablets per day only once or twice a week, therefore it's reasonable for the patient to have excess lisinopril  at home. No further action needed.    Abelina Abide, PharmD PGY1 Pharmacy Resident 06/24/2023 8:44 AM

## 2023-07-13 ENCOUNTER — Other Ambulatory Visit: Payer: Self-pay | Admitting: Internal Medicine

## 2023-07-13 ENCOUNTER — Telehealth: Payer: Self-pay | Admitting: Internal Medicine

## 2023-07-13 MED ORDER — FLUDROCORTISONE ACETATE 0.1 MG PO TABS: 50.0000 ug | ORAL_TABLET | Freq: Every day | ORAL | 3 refills | Status: AC

## 2023-07-13 NOTE — Telephone Encounter (Signed)
 Pt's medication was sent to pt's pharmacy as requested. Confirmation received.

## 2023-07-13 NOTE — Telephone Encounter (Signed)
*  STAT* If patient is at the pharmacy, call can be transferred to refill team.   1. Which medications need to be refilled? (please list name of each medication and dose if known)   fludrocortisone  (FLORINEF ) 0.1 MG tablet     4. Which pharmacy/location (including street and city if local pharmacy) is medication to be sent to?  [id number], [address] - [address] AT [facility] [address]     5. Do they need a 30 day or 90 day supply? 90

## 2023-07-14 ENCOUNTER — Other Ambulatory Visit: Payer: Self-pay | Admitting: Adult Health

## 2023-07-14 DIAGNOSIS — E042 Nontoxic multinodular goiter: Secondary | ICD-10-CM | POA: Diagnosis not present

## 2023-07-14 DIAGNOSIS — E1065 Type 1 diabetes mellitus with hyperglycemia: Secondary | ICD-10-CM | POA: Diagnosis not present

## 2023-07-15 DIAGNOSIS — F3189 Other bipolar disorder: Secondary | ICD-10-CM | POA: Diagnosis not present

## 2023-07-15 DIAGNOSIS — E103293 Type 1 diabetes mellitus with mild nonproliferative diabetic retinopathy without macular edema, bilateral: Secondary | ICD-10-CM | POA: Diagnosis not present

## 2023-07-15 DIAGNOSIS — F5105 Insomnia due to other mental disorder: Secondary | ICD-10-CM | POA: Diagnosis not present

## 2023-07-29 ENCOUNTER — Other Ambulatory Visit: Payer: Self-pay | Admitting: Adult Health

## 2023-07-30 ENCOUNTER — Other Ambulatory Visit: Payer: Self-pay | Admitting: Adult Health

## 2023-07-30 ENCOUNTER — Telehealth: Payer: Self-pay | Admitting: Adult Health

## 2023-07-30 NOTE — Telephone Encounter (Signed)
 I called [facility]. I was told the patient picked up the last 180 ct refill on 07/16/23. I called the patient. She doesn't believe she picked it up. She will try to look for it and she cannot locate the refill she will speak with the pharmacy. We may be able to send in an early refill but this requires provider approval. Patient thanked me for the call.

## 2023-07-30 NOTE — Telephone Encounter (Signed)
 Pt called back stating that Pharmacy will call to see if they can get medication sent out and pt will pay out of pocket  for medication

## 2023-07-30 NOTE — Telephone Encounter (Signed)
 Pt asking that RN calls pharamcay on her behalf to explain that they already have a 90 day supply that was sent back on June 5th

## 2023-07-31 ENCOUNTER — Other Ambulatory Visit: Payer: Self-pay | Admitting: Adult Health

## 2023-07-31 NOTE — Telephone Encounter (Addendum)
 See other phone note. Pt did not think she had the recent refill from 07/16/23. She was told to contact pharmacy to discuss further. May need sooner refill now. Pharmacy has requested another refill from us  (sooner). Needs provider approval. Jacqlyn Matas NP is out of office today. Will request approval from work-in.

## 2023-08-28 ENCOUNTER — Other Ambulatory Visit: Payer: Self-pay | Admitting: Internal Medicine

## 2023-08-31 ENCOUNTER — Ambulatory Visit: Admitting: Neurology

## 2023-08-31 ENCOUNTER — Encounter: Payer: Self-pay | Admitting: Neurology

## 2023-08-31 VITALS — BP 164/95 | HR 90 | Ht 67.0 in | Wt 165.4 lb

## 2023-08-31 DIAGNOSIS — G20C Parkinsonism, unspecified: Secondary | ICD-10-CM

## 2023-08-31 DIAGNOSIS — I951 Orthostatic hypotension: Secondary | ICD-10-CM

## 2023-08-31 DIAGNOSIS — G629 Polyneuropathy, unspecified: Secondary | ICD-10-CM | POA: Diagnosis not present

## 2023-08-31 DIAGNOSIS — E1043 Type 1 diabetes mellitus with diabetic autonomic (poly)neuropathy: Secondary | ICD-10-CM | POA: Diagnosis not present

## 2023-08-31 DIAGNOSIS — G2401 Drug induced subacute dyskinesia: Secondary | ICD-10-CM | POA: Diagnosis not present

## 2023-08-31 DIAGNOSIS — Z9641 Presence of insulin pump (external) (internal): Secondary | ICD-10-CM | POA: Diagnosis not present

## 2023-08-31 DIAGNOSIS — G90A Postural orthostatic tachycardia syndrome (POTS): Secondary | ICD-10-CM | POA: Diagnosis not present

## 2023-08-31 NOTE — Progress Notes (Signed)
 Provider:  Dedra Gores, MD  Primary Care Physician:  Norleen Lynwood ORN, MD [address] [place] [address]     Referring Provider: Norleen Lynwood ORN, Md [address] [place],  [place] [address]          Chief Complaint according to patient   Patient presents with:                HISTORY OF PRESENT ILLNESS:  Lynn Barnes is a 63 y.o. female patient with long standing  induced parkinsonisms and DM 1 dysautonomia,  who is here for revisit 08/31/2023.  She is not depressed and very satisfied with her life.  No syncope, Orthostatic hypotension-  But in Summer unable to tolerate compression stockings and abdominal binder.   No worsening of parkinsonism.  No akathesia today.   Dry mouth in AM, snoring.    Chief concern according to patient :    My memory is impaired, I think its just age   54 month follow up , she started that she having issues with memory, she would go to retrieve something and will forget what she needed , may look for words - but no problems with names.      08/20/20 Rv with Dedra Gores, MD, routine Rv.  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  Over the years tremors have increased and at times she had problems with orthostatic hypotension.  She does have a resting tremor which affects both upper extremities, she has noticed it with action as well as at rest.   Even after DaTscan  in November 2019 did not confirm Parkinson pathology .I have wanted her to see Dr. Elayne Meeter [facility] for differentiation between parkinsonism which could be drug-induced and a secondary disorder of true  Parkinson disease which can sometimes still occur.   I also think that a deep brain stimulator sometimes treats our essential tremor patients better than high-dose medication.   The patient responded very well to Sinemet  which is carbidopa  levodopa  and without making a diagnosis of Parkinson's disease -I do still think she has parkinsonism.    She is seen every three months by her psychiatrist, Dr Dominick.  She continues Latuda, Risperdal , Valium and Ambien  prescribed.  She reports that she continues to struggle with some anxiety but feels that she is stable overall.   She is seeing Vina Gull for POTS and reports that symptoms are controlled. She is taking Pindolol  5mg  BID.   Review of Systems: Out of a complete 14 system review, the patient complains of only the following symptoms, and all other reviewed systems are negative.:    Low amplitude  fine tremor.     Social History   Socioeconomic History   Marital status: Married    Spouse name: Lynn Barnes   Number of children: 0   Years of education: BA   Highest education level: Not on file  Occupational History   Occupation: RETIRED/TEACHER    Employer: [employer]  Tobacco Use   Smoking status: Never   Smokeless tobacco: Never  Vaping Use   Vaping status: Never Used  Substance and Sexual Activity   Alcohol use: No    Alcohol/week: 0.0 standard drinks of alcohol   Drug use: No   Sexual activity: Never  Other Topics Concern   Not on file  Social History Narrative   Patient is single and lives with my partner Lynn Barnes) and also father-in-law.   Patient is disabled.   Patient is right-handed.   Patient has a Probation officer.   Patient drinks one soda every other day.   Social Drivers of Corporate investment banker Strain: Low Risk  (06/17/2023)   Overall Financial Resource Strain (CARDIA)    Difficulty of Paying Living Expenses: Not very hard  Food Insecurity: No Food Insecurity (06/17/2023)   Hunger Vital  Sign    Worried About Running Out of Food in the Last Year: Never true    Ran Out of Food in the Last Year: Never true  Transportation Needs: No Transportation Needs (06/17/2023)   PRAPARE - Administrator, Civil Service (Medical): No    Lack of Transportation (Non-Medical): No  Physical Activity: Sufficiently Active (06/17/2023)   Exercise Vital Sign    Days of Exercise per Week: 5 days    Minutes of Exercise per Session: 40 min  Stress: No Stress Concern Present (06/17/2023)   [employer] of Occupational Health - Occupational Stress Questionnaire    Feeling of Stress : Only a little  Social Connections: Moderately Isolated (06/17/2023)   Social Connection and Isolation Panel    Frequency of Communication with Friends and Family: More than three times a week    Frequency of Social Gatherings with Friends and Family: Twice a week    Attends Religious Services: Never    Database administrator or Organizations: No    Attends Engineer, structural: Never    Marital Status: Married    Family History  Problem Relation Age of Onset   Lung cancer Mother    Heart disease Father    Hypertension Father    Pneumonia Father    Heart disease Paternal Grandmother    ALS Paternal Grandmother    Hypertension Sister     Past Medical History:  Diagnosis Date   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   Addiction, opium (HCC)    Allergy    spring   Anxiety    ADHD CHRONIC ANXIETY   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 (HCC)    Bipolar disorder (HCC) 02/15/2011   Cervical spondylosis without myelopathy    Depression    Depression    Diabetes mellitus    sees Dr Altheimer/endo   Dysautonomia (HCC) 10/24/2011   Dyskinesia     subacute,due to drugs   Dyslipidemia    History of nuclear stress test    Myoview 11/16: EF 81%, no ischemia; Low Risk   Hyperlipidemia    IBS (irritable bowel syndrome) 02/15/2011   Insomnia 02/21/2011   Multinodular thyroid     with large dominant  solid nodule with calcifications in the lower left pole benign by FNA in 1/12 and stable by repeat ulltrasound in 4/13   Neuropathy    with paresthesias in feet   Orthostatic hypotension    Orthostatic hypotension    Parkinsonism (HCC)    due to prolonged prozac/risperdl for yrs/Dr Dohmeir   Parkinsonism (HCC)    POTS (postural orthostatic tachycardia syndrome)    Radiculopathy    cervical   Resting tremor    Rotator cuff syndrome of left  shoulder    S/P partial hysterectomy    Spondylosis without myelopathy    Subacute dyskinesia due to drugs(333.85)    dr Evy Lutterman/neuro   Tachycardia    syndrome   Therapy    on therapy by psychiatrist using sebutex    Thyroid  nodule    s/p biopsy - benign approx jan 2012   Tremor     Past Surgical History:  Procedure Laterality Date   ACROMIOPLASTY Left 2002   Dr Heide   BREAST EXCISIONAL BIOPSY Right    benign   BREAST LUMPECTOMY Right    herniated disc cervical spine     lower back  1992   OOPHORECTOMY     PARTIAL HYSTERECTOMY     SHOULDER SURGERY Left    SPINE SURGERY  03/2010   c5-6 fusion, Dr Burnetta   THYROID  LOBECTOMY  12/2009   nodule biopsy, benign     Current Outpatient Medications on File Prior to Visit  Medication Sig Dispense Refill   atorvastatin  (LIPITOR) 40 MG tablet Take 1 tablet (40 mg total) by mouth daily. 90 tablet 3   busPIRone (BUSPAR) 10 MG tablet Take 20 mg by mouth 2 (two) times daily.     carbidopa -levodopa  (SINEMET  CR) 50-200 MG tablet TAKE ONE TABLET BY MOUTH TWICE A DAY 120 tablet 0   ezetimibe  (ZETIA ) 10 MG tablet TAKE ONE TABLET BY MOUTH ONE TIME DAILY 90 tablet 3   fludrocortisone  (FLORINEF ) 0.1 MG tablet Take 0.5 tablets (50 mcg total) by mouth daily. 45 tablet 3   gabapentin  (NEURONTIN ) 400 MG capsule Take by mouth.     Glucagon 3 MG/DOSE POWD Place into the nose.     glucose blood test strip 1 each by Other route. 7 times per day   - as instructed     HUMALOG KWIKPEN 100 UNIT/ML KwikPen  INJECT BEFORE EACH MEAL WITH 1 UNIT PER 10 GM CARBS PLUS CORRECTIN OF 1 UNIT PER 50 MG     insulin degludec (TRESIBA) 100 UNIT/ML FlexTouch Pen Inject 27 Units into the skin daily.      LATUDA 120 MG TABS Take 1 tablet by mouth at bedtime.     lisinopril  (ZESTRIL ) 20 MG tablet TAKE ONE TABLET BY MOUTH ONE TIME DAILY, TAKE AN EXTRA TABLET BY MOUTH DAILY IF FOR SYSTOLIC BLOOD PRESSURE OVER 165 90 tablet 2   pindolol  (VISKEN ) 5 MG tablet TAKE ONE TABLET BY MOUTH TWICE A DAY 180 tablet 1   POTASSIUM CITRATE PO Take 1 capsule by mouth every other day.     risperidone  (RISPERDAL ) 4 MG tablet Take 8 mg by mouth at bedtime.  0   scopolamine  (TRANSDERM SCOP , 1.5 MG,) 1 MG/3DAYS Place 1 patch (1.5 mg total) onto the skin every 3 (three) days. 10 patch 12   traMADol  (ULTRAM ) 50 MG tablet TAKE ONE TABLET BY MOUTH EVERY 6 HOURS AS NEEDED FOR MODERATE PAIN 120 tablet 5   trihexyphenidyl  (ARTANE ) 2 MG tablet Take 1 tablet (2 mg total) by mouth 3 (three) times daily with meals. 90 tablet 11   zolpidem  (AMBIEN ) 10 MG tablet Take 10 mg by mouth at bedtime.  1   No current facility-administered medications on file prior to visit.    Allergies  Allergen Reactions   Klonopin [Clonazepam] Other (See Comments)    Severe weakness   Nsaids Other (See Comments)    Irritates stomach   Sulfonamide Derivatives Other (See Comments)   Sulfa Antibiotics Rash     DIAGNOSTIC DATA (  LABS, IMAGING, TESTING) - I reviewed patient records, labs, notes, testing and imaging myself where available.  Lab Results  Component Value Date   WBC 6.4 10/10/2022   HGB 13.4 10/10/2022   HCT 39.8 10/10/2022   MCV 96.9 10/10/2022   PLT 315.0 10/10/2022      Component Value Date/Time   NA 134 (L) 10/10/2022 1523   NA 143 12/22/2019 1506   K 4.1 10/10/2022 1523   CL 98 10/10/2022 1523   CO2 28 10/10/2022 1523   GLUCOSE 131 (H) 10/10/2022 1523   BUN 18 10/10/2022 1523   BUN 17 12/22/2019 1506   CREATININE 0.84 10/10/2022 1523    CREATININE 0.87 07/06/2015 1523   CALCIUM  9.3 10/10/2022 1523   PROT 6.5 10/10/2022 1523   PROT 6.3 06/05/2022 1251   ALBUMIN 3.9 10/10/2022 1523   ALBUMIN 4.1 06/05/2022 1251   AST 26 10/10/2022 1523   ALT 20 10/10/2022 1523   ALKPHOS 73 10/10/2022 1523   BILITOT 0.4 10/10/2022 1523   BILITOT 0.4 06/05/2022 1251   GFRNONAA >60 11/05/2021 0712   GFRAA 77 12/22/2019 1506   Lab Results  Component Value Date   CHOL 126 10/10/2022   HDL 46.60 10/10/2022   LDLCALC 66 10/10/2022   TRIG 70.0 10/10/2022   CHOLHDL 3 10/10/2022   Lab Results  Component Value Date   HGBA1C 7.9 (H) 10/10/2022   Lab Results  Component Value Date   VITAMINB12 >1501 (H) 10/10/2022   Lab Results  Component Value Date   TSH 1.42 10/10/2022    PHYSICAL EXAM:  Vitals:   08/31/23 1507  BP: (!) 164/95  Pulse: 90   No data found. Body mass index is 25.91 kg/m.   Wt Readings from Last 3 Encounters:  08/31/23 165 lb 6.4 oz (75 kg)  06/17/23 161 lb (73 kg)  05/12/23 161 lb 6.4 oz (73.2 kg)     Ht Readings from Last 3 Encounters:  08/31/23 5' 7 (1.702 m)  06/17/23 5' 7 (1.702 m)  05/12/23 5' 7 (1.702 m)      General: The patient is awake, alert and appears not in acute distress and groomed. Head: Normocephalic, atraumatic.  Neck is supple. Body mass index is 25.76 kg/m.   Generalized: Well developed, in no acute distress  Cardiology: normal rate and rhythm, no murmur noted Neurological examination  Mentation: Alert oriented to time, place, history taking.  Follows all commands, and her  speech and language are fluent.   We obtained a repeat MMSE  today:  using the spelling for WORLD instead of serial 7 th patient scored 30/ 30 points.     Cranial nerve : MASKED FACE: Pupils were equal in size and shape and reactive to light. Extraocular movements were full, visual field were full on confrontational test.  Facial sensation and strength were normal. Uvula and tongue midline. Tongue  tremor.  Head turning  with titubation. Motor: Full strength and bulk with ROM of bilateral upper extremities.   4/5 of bilateral lower extremities.   No bradykinesia noted today.  She does have tremor of her jaw that is worse with action. No cogwheeling.  Sensory: deferred.  Coordination: Cerebellar testing reveals good finger-nose-finger bilaterally.  There is tremor at at action and with outstretched hands , fine amplitude. . Equal grip strength bilaterally but lower strength.   Gait and station: Gait is stable.  The patient reports that she is sometimes furniture surfing or looking for wall to hold onto and that  her turns are fragmented.  Her highest risk of falling is with turning bending forwards or getting something from a higher shelf, stretching upwards.    Deep tendon reflexes: in the  upper and lower extremities are symmetric and intact.  Babinski response was deferred/    ASSESSMENT AND PLAN :   63 y.o. 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  Tremor.  Stay on Sinemet  .   Dry mouth and dry eyes, Sensory and autonomic neuropathy / POTS.  No recent falls .   Hydrate well and control DM, and keep up swimming regularly .  The patient declined an offer for a HST.   Some STM loss reported ( subjective) but MMSE 30/ 30/ no need to test further this year.     08/21/2014    3:26 PM  MMSE - Mini Mental State Exam  Orientation to time 5   Orientation to Place 5   Registration 3   Attention/ Calculation 5   Recall 3   Language- name 2 objects 2   Language- repeat 1  Language- follow 3 step command 2   Language- read & follow direction 1   Write a sentence 1   Copy design 1   Total score 29      Data saved with a previous flowsheet row definition       Rv in 12 month for refills with Np     I would like to thank Norleen Lynwood ORN, MD and Norleen Lynwood ORN, Md [address] [place],  [place] [address] for allowing me  to meet with this pleasant patient.    The patient's condition requires frequent monitoring and adjustments in the treatment plan, reflecting the ongoing complexity of care.  This provider is the continuing focal point for all needed services for this condition.  After spending a total time of  29  minutes face to face and time for  history taking, physical and neurologic examination, review of laboratory studies,  personal review of imaging studies, reports and results of other testing and review of referral information / records as far as provided in visit,   Electronically signed by: Dedra Gores, MD 08/31/2023 3:39 PM  [facility] and [facility] Sleep Board certified by The [employer] of Sleep Medicine and Diplomate of the [employer] of Sleep Medicine. Board certified In Neurology through the ABPN, Fellow of the [employer] of Neurology.

## 2023-08-31 NOTE — Patient Instructions (Addendum)
 Tardive Dyskinesia Tardive dyskinesia is a disorder that causes uncontrollable body movements. It occurs in some people who are taking certain medicines to treat a mental illness (neuroleptic medicine) or have taken this type of medicine in the past. These medicines block the effects of a specific brain chemical called dopamine.  Sometimes, tardive dyskinesia starts months or years after someone took the medicine. Not everyone who takes a neuroleptic medicine will get tardive dyskinesia. What are the causes? This condition is caused by changes in your brain that are associated with taking a neuroleptic medicine. What increases the risk? If you are taking a neuroleptic medicine, your risk for tardive dyskinesia may be higher if: You are taking an older type of neuroleptic medicine. You have been taking the medicine for a long time at a high dose. You are a woman past the age of menopause. You are older than 60 years. You have a history of alcohol or drug abuse. What are the signs or symptoms? Abnormal, uncontrollable movements are the main symptom of tardive dyskinesia. These types of movements may include: Grimacing. Sticking out or twisting your tongue. Making chewing or sucking sounds. Blinking your eyes. Twisting, swaying, or thrusting your body. Foot tapping or finger waving. Rapid movements of your arms or legs. How is this diagnosed? Your health care provider may suspect that you have tardive dyskinesia if: You have been taking neuroleptic medicines. You have abnormal movements that you cannot control. If you are taking a medicine that can cause tardive dyskinesia, your health care provider may screen you for early signs of the condition. This may include: Observing your body movements. Using a specific rating scale called the Abnormal Involuntary Movement Scale (AIMS). You may also have tests to rule out other conditions that cause abnormal body movements,  including: Parkinsonia Huntington's disease. Stroke. How is this treated? The best treatment for tardive dyskinesia is to lower the dose of your medicine or to switch to a different medicine at the first sign of abnormal and uncontrolled movements. There is no cure for long-term (chronic) tardive dyskinesia. Some medicines may help control the movements. These include: Medicines called VMAT2 inhibitors that control chemicals in the brain. Clozapine, a medicine used to treat mental illness (antipsychotic). Some muscle relaxants. Some anti-seizure medicines. Some medicines used to treat high blood pressure. Some tranquilizers (sedatives). Follow these instructions at home:     Take over-the-counter and prescription medicines only as told by your health care provider. Do not stop or start taking any medicines without talking to your health care provider first. Do not abuse drugs or alcohol. Keep all follow-up visits. This is important. Contact a health care provider if: You are unable to eat or drink. You have had a fall. Your symptoms get worse. This information is not intended to replace advice given to you by your health care provider. Make sure you discuss any questions you have with your health care provider. Document Revised: 06/18/2022 Document Reviewed: 12/23/2020 Elsevier Patient Education  2024 Elsevier Inc.  ASSESSMENT AND PLAN :    63 y.o. 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  Tremor.  Stay on Sinemet  .    Dry mouth and dry eyes, Sensory and autonomic neuropathy / POTS.  No recent falls .   Hydrate well and control DM, and keep up swimming regularly .  The  patient declined an offer for a HST.    Some STM loss reported ( subjective) but MMSE 30/ 30/ no need to test further this year.

## 2023-09-01 ENCOUNTER — Other Ambulatory Visit: Payer: Self-pay | Admitting: Internal Medicine

## 2023-09-02 DIAGNOSIS — D485 Neoplasm of uncertain behavior of skin: Secondary | ICD-10-CM | POA: Diagnosis not present

## 2023-09-02 DIAGNOSIS — L821 Other seborrheic keratosis: Secondary | ICD-10-CM | POA: Diagnosis not present

## 2023-09-11 ENCOUNTER — Other Ambulatory Visit: Payer: Self-pay | Admitting: Internal Medicine

## 2023-09-16 DIAGNOSIS — F5105 Insomnia due to other mental disorder: Secondary | ICD-10-CM | POA: Diagnosis not present

## 2023-09-16 DIAGNOSIS — F3189 Other bipolar disorder: Secondary | ICD-10-CM | POA: Diagnosis not present

## 2023-09-20 ENCOUNTER — Emergency Department (HOSPITAL_COMMUNITY)

## 2023-09-20 ENCOUNTER — Emergency Department (HOSPITAL_COMMUNITY)
Admission: EM | Admit: 2023-09-20 | Discharge: 2023-09-20 | Disposition: A | Discharge status: Home / Self Care | Attending: Emergency Medicine | Admitting: Emergency Medicine

## 2023-09-20 DIAGNOSIS — S82851A Displaced trimalleolar fracture of right lower leg, initial encounter for closed fracture: Secondary | ICD-10-CM | POA: Diagnosis not present

## 2023-09-20 DIAGNOSIS — E109 Type 1 diabetes mellitus without complications: Secondary | ICD-10-CM | POA: Diagnosis not present

## 2023-09-20 DIAGNOSIS — Y92196 Pool of other specified residential institution as the place of occurrence of the external cause: Secondary | ICD-10-CM | POA: Insufficient documentation

## 2023-09-20 DIAGNOSIS — S99911A Unspecified injury of right ankle, initial encounter: Secondary | ICD-10-CM | POA: Diagnosis present

## 2023-09-20 DIAGNOSIS — R0902 Hypoxemia: Secondary | ICD-10-CM | POA: Diagnosis not present

## 2023-09-20 DIAGNOSIS — W19XXXA Unspecified fall, initial encounter: Secondary | ICD-10-CM | POA: Diagnosis not present

## 2023-09-20 DIAGNOSIS — W1830XA Fall on same level, unspecified, initial encounter: Secondary | ICD-10-CM | POA: Diagnosis not present

## 2023-09-20 DIAGNOSIS — Z794 Long term (current) use of insulin: Secondary | ICD-10-CM | POA: Diagnosis not present

## 2023-09-20 DIAGNOSIS — G20C Parkinsonism, unspecified: Secondary | ICD-10-CM | POA: Insufficient documentation

## 2023-09-20 DIAGNOSIS — S99921A Unspecified injury of right foot, initial encounter: Secondary | ICD-10-CM | POA: Diagnosis not present

## 2023-09-20 DIAGNOSIS — S8251XA Displaced fracture of medial malleolus of right tibia, initial encounter for closed fracture: Secondary | ICD-10-CM | POA: Diagnosis not present

## 2023-09-20 DIAGNOSIS — Y9301 Activity, walking, marching and hiking: Secondary | ICD-10-CM | POA: Insufficient documentation

## 2023-09-20 DIAGNOSIS — S82451A Displaced comminuted fracture of shaft of right fibula, initial encounter for closed fracture: Secondary | ICD-10-CM | POA: Diagnosis not present

## 2023-09-20 DIAGNOSIS — S82841A Displaced bimalleolar fracture of right lower leg, initial encounter for closed fracture: Secondary | ICD-10-CM | POA: Diagnosis not present

## 2023-09-20 LAB — CBG MONITORING, ED
Glucose-Capillary: 153 mg/dL — ABNORMAL HIGH (ref 70–99)
Glucose-Capillary: 53 mg/dL — ABNORMAL LOW (ref 70–99)

## 2023-09-20 MED ORDER — MORPHINE SULFATE (PF) 4 MG/ML IV SOLN
4.0000 mg | Freq: Once | INTRAVENOUS | Status: AC
  Administered 2023-09-20: 4 mg via INTRAVENOUS
  Filled 2023-09-20: qty 1

## 2023-09-20 MED ORDER — OXYCODONE-ACETAMINOPHEN 5-325 MG PO TABS
1.0000 | ORAL_TABLET | Freq: Four times a day (QID) | ORAL | 0 refills | Status: AC | PRN
Start: 2023-09-20 — End: ?

## 2023-09-20 MED ORDER — PROPOFOL 10 MG/ML IV BOLUS
INTRAVENOUS | Status: AC | PRN
  Administered 2023-09-20: 70 mg via INTRAVENOUS

## 2023-09-20 MED ORDER — PROPOFOL 10 MG/ML IV BOLUS
INTRAVENOUS | Status: AC
  Filled 2023-09-20: qty 20

## 2023-09-20 MED ORDER — PROPOFOL 10 MG/ML IV BOLUS: 0.5000 mg/kg | Freq: Once | INTRAVENOUS | Status: DC

## 2023-09-20 MED ORDER — ONDANSETRON HCL 4 MG/2ML IJ SOLN
4.0000 mg | Freq: Once | INTRAMUSCULAR | Status: AC
  Administered 2023-09-20: 4 mg via INTRAVENOUS
  Filled 2023-09-20: qty 2

## 2023-09-20 NOTE — Progress Notes (Signed)
 Orthopedic Tech Progress Note Patient Details:  Lynn Barnes 04-Oct-1960 [id number]  Ortho Devices Type of Ortho Device: Post splint, Stirrup splint Splint Material: Fiberglass Ortho Device/Splint Location: RLE/Conscious sedation reduction Ortho Device/Splint Interventions: Ordered, Application, Adjustment   Post Interventions Patient Tolerated: Well Instructions Provided: Care of device  Adine MARLA Blush 09/20/2023, 3:35 PM

## 2023-09-20 NOTE — ED Notes (Signed)
 Pt given graham crackers and peanut butter w/ a coke. Per Doretha, MD pt able to eat due to low blood sugar (53).

## 2023-09-20 NOTE — ED Notes (Signed)
 PT tolerated sedation with no untoward effects.

## 2023-09-20 NOTE — ED Provider Notes (Signed)
 Lynn Barnes Provider Note   CSN: [id number] Arrival date & time: 09/20/23  1224     Patient presents with: Ankle Injury   Lynn Barnes is a 63 y.o. female.   Patient is a 63 year old female with a history of type 1 diabetes on insulin pump, parkinsonism, subacute dyskinesia related to drugs, dysautonomia who is presenting today after falling.  She reports she was walking at the pool when her foot got caught in a metal chair wire causing her to fall forward and twist her ankle.  She had immediate pain and deformity of her right ankle.  Has been unable to stand.  She denies any injury to her arms, head injury or loss of consciousness.  She is having significant pain in her ankle but denies any other areas of pain.  She has normal movement of her foot but reports it feels a little bit numb.  The history is provided by the patient.  Ankle Injury       Prior to Admission medications   Medication Sig Start Date End Date Taking? Authorizing Provider  atorvastatin  (LIPITOR) 40 MG tablet Take 1 tablet (40 mg total) by mouth daily. 12/12/22   Norleen Lynwood ORN, MD  busPIRone (BUSPAR) 10 MG tablet Take 20 mg by mouth 2 (two) times daily. 03/13/22   [provider]  carbidopa -levodopa  (SINEMET  CR) 50-200 MG tablet TAKE ONE TABLET BY MOUTH TWICE A DAY 07/31/23   Athar, Saima, MD  ezetimibe  (ZETIA ) 10 MG tablet TAKE ONE TABLET BY MOUTH ONE TIME DAILY 06/08/23   Okey Vina GAILS, MD  fludrocortisone  (FLORINEF ) 0.1 MG tablet Take 0.5 tablets (50 mcg total) by mouth daily. 07/13/23   Okey Vina GAILS, MD  gabapentin  (NEURONTIN ) 400 MG capsule Take by mouth. 10/21/22   [provider]  Glucagon 3 MG/DOSE POWD Place into the nose. 01/28/18   [provider]  glucose blood test strip 1 each by Other route. 7 times per day   - as instructed    [provider]  HUMALOG KWIKPEN 100 UNIT/ML KwikPen INJECT BEFORE EACH MEAL WITH 1 UNIT PER 10  GM CARBS PLUS CORRECTIN OF 1 UNIT PER 50 MG 02/25/18   [provider]  insulin degludec (TRESIBA) 100 UNIT/ML FlexTouch Pen Inject 27 Units into the skin daily.  01/28/18   [provider]  LATUDA 120 MG TABS Take 1 tablet by mouth at bedtime. 03/24/19   [provider]  lisinopril  (ZESTRIL ) 20 MG tablet TAKE ONE TABLET BY MOUTH ONE TIME DAILY, TAKE AN EXTRA TABLET BY MOUTH DAILY IF FOR SYSTOLIC BLOOD PRESSURE OVER 165 08/28/23   Wyn Jackee VEAR Mickey., NP  pindolol  (VISKEN ) 5 MG tablet TAKE ONE TABLET BY MOUTH TWICE A DAY 06/05/23   Okey Vina GAILS, MD  POTASSIUM CITRATE PO Take 1 capsule by mouth every other day.    [provider]  risperidone  (RISPERDAL ) 4 MG tablet Take 8 mg by mouth at bedtime. 01/15/15   [provider]  scopolamine  (TRANSDERM SCOP , 1.5 MG,) 1 MG/3DAYS Place 1 patch (1.5 mg total) onto the skin every 3 (three) days. 09/03/21   Norleen Lynwood ORN, MD  traMADol  (ULTRAM ) 50 MG tablet TAKE ONE TABLET BY MOUTH EVERY 6 HOURS AS NEEDED FOR MODERATE PAIN 04/16/23   Norleen Lynwood ORN, MD  trihexyphenidyl  (ARTANE ) 2 MG tablet Take 1 tablet (2 mg total) by mouth 3 (three) times daily with meals. 11/06/21   Norleen Lynwood  W, MD  zolpidem  (AMBIEN ) 10 MG tablet Take 10 mg by mouth at bedtime. 04/06/15   [provider]    Allergies: Klonopin [clonazepam], Nsaids, Sulfonamide derivatives, and Sulfa antibiotics    Review of Systems  Updated Vital Signs BP (!) 145/88 (BP Location: Right Arm)   Pulse 76   Temp 98.8 F (37.1 C) (Oral)   Resp 12   Ht 5' 7 (1.702 m)   Wt 73.9 kg   SpO2 100%   BMI 25.53 kg/m   Physical Exam Vitals and nursing note reviewed.  HENT:     Head: Normocephalic.  Eyes:     Extraocular Movements: Extraocular movements intact.     Pupils: Pupils are equal, round, and reactive to light.  Cardiovascular:     Rate and Rhythm: Normal rate.     Pulses: Normal pulses.  Pulmonary:     Effort: Pulmonary effort is normal.   Musculoskeletal:        General: Tenderness and deformity present.     Right ankle: Deformity present. Tenderness present over the lateral malleolus and medial malleolus. Decreased range of motion.  Neurological:     Mental Status: She is alert. Mental status is at baseline.     (all labs ordered are listed, but only abnormal results are displayed) Labs Reviewed  CBG MONITORING, ED - Abnormal; Notable for the following components:      Result Value   Glucose-Capillary 153 (*)    All other components within normal limits  CBG MONITORING, ED - Abnormal; Notable for the following components:   Glucose-Capillary 53 (*)    All other components within normal limits    EKG: None  Radiology: DG Ankle Complete Right Result Date: 09/20/2023 CLINICAL DATA:  Post reduction is EXAM: RIGHT ANKLE - COMPLETE 3+ VIEW COMPARISON:  Right ankle x-ray 09/20/2023 FINDINGS: There is an acute transverse fracture through the medial malleolus. There is 4 mm of lateral distraction of the proximal tibia. There is an acute comminuted oblique fracture through the distal fibula at and above the level of the ankle mortise with mild apex medial angulation. Fracture fragments are distracted up to 6 mm. There is an acute posterior malleolar fracture with fracture fragments distracted 5 mm. There is trace anteromedial subluxation of the distal tibia in relation to the talar dome. There is diffuse soft tissue swelling. Orthopedic screws are partially visualized in the first metatarsal. IMPRESSION: 1.  Acute trimalleolar fracture. 2. Trace anteromedial subluxation of the distal tibia in relation to the talar dome. Electronically Signed   By: Greig Pique M.D.   On: 09/20/2023 15:48   DG Ankle 2 Views Right Result Date: 09/20/2023 CLINICAL DATA:  Fall with ankle deformity. EXAM: RIGHT ANKLE - 2 VIEW COMPARISON:  None Available. FINDINGS: Markedly displaced fractures of the distal fibula and medial malleolus with at least 1  tibial shaft width lateral subluxation of the talar dome. Talar dome is intact. Assessment of the posterior malleolus is challenging due to bony overlap. IMPRESSION: Bimalleolar ankle fracture dislocation. Assessment of the posterior malleolus is challenging due to bony overlap. Electronically Signed   By: Newell Eke M.D.   On: 09/20/2023 14:48     .Sedation  Date/Time: 09/20/2023 4:01 PM  Performed by: Doretha Folks, MD Authorized by: Doretha Folks, MD   Consent:    Consent obtained:  Verbal   Consent given by:  Patient   Risks discussed:  Allergic reaction, dysrhythmia, inadequate sedation, nausea, prolonged hypoxia resulting in organ damage,  prolonged sedation necessitating reversal, respiratory compromise necessitating ventilatory assistance and intubation and vomiting   Alternatives discussed:  Analgesia without sedation, anxiolysis and regional anesthesia Universal protocol:    Procedure explained and questions answered to patient or proxy's satisfaction: yes     Relevant documents present and verified: yes     Test results available: yes     Imaging studies available: yes     Required blood products, implants, devices, and special equipment available: yes     Site/side marked: yes     Immediately prior to procedure, a time out was called: yes     Patient identity confirmed:  Verbally with patient Indications:    Procedure necessitating sedation performed by:  Physician performing sedation Pre-sedation assessment:    Time since last food or drink:  8 hours   ASA classification: class 2 - patient with mild systemic disease     Mouth opening:  3 or more finger widths   Thyromental distance:  4 finger widths   Mallampati score:  I - soft palate, uvula, fauces, pillars visible   Neck mobility: normal     Pre-sedation assessments completed and reviewed: airway patency, cardiovascular function, hydration status, mental status, nausea/vomiting, pain level, respiratory  function and temperature   A pre-sedation assessment was completed prior to the start of the procedure Immediate pre-procedure details:    Reassessment: Patient reassessed immediately prior to procedure     Reviewed: vital signs, relevant labs/tests and NPO status     Verified: bag valve mask available, emergency equipment available, intubation equipment available, IV patency confirmed, oxygen available and suction available   Procedure details (see MAR for exact dosages):    Preoxygenation:  Nasal cannula   Sedation:  Propofol    Intended level of sedation: deep   Analgesia:  Morphine    Intra-procedure monitoring:  Blood pressure monitoring, cardiac monitor, continuous pulse oximetry, frequent LOC assessments, frequent vital sign checks and continuous capnometry   Intra-procedure events: none     Total Provider sedation time (minutes):  10 Post-procedure details:   A post-sedation assessment was completed following the completion of the procedure.   Attendance: Constant attendance by certified staff until patient recovered     Recovery: Patient returned to pre-procedure baseline     Post-sedation assessments completed and reviewed: airway patency, cardiovascular function, hydration status, mental status, nausea/vomiting, pain level, respiratory function and temperature     Patient is stable for discharge or admission: yes     Procedure completion:  Tolerated well, no immediate complications .Reduction of dislocation  Date/Time: 09/20/2023 4:02 PM  Performed by: Doretha Folks, MD Authorized by: Doretha Folks, MD  Consent: Verbal consent obtained Consent given by: patient Patient understanding: patient states understanding of the procedure being performed Imaging studies: imaging studies available Patient identity confirmed: verbally with patient Time out: Immediately prior to procedure a time out was called to verify the correct patient, procedure, equipment, support staff and  site/side marked as required. Local anesthesia used: no  Anesthesia: Local anesthesia used: no  Sedation: Patient sedated: yes Sedation type: moderate (conscious) sedation Sedatives: propofol  Analgesia: morphine   Patient tolerance: patient tolerated the procedure well with no immediate complications Comments: Pt placed in posterior and stirrup splint with reduction of ankle fracture with gentle traction and foot eversion      Medications Ordered in the ED  propofol  (DIPRIVAN ) 10 mg/mL bolus/IV push 0.5 mg/kg ( Intravenous Canceled Entry 09/20/23 1526)  morphine  (PF) 4 MG/ML injection 4 mg (4 mg Intravenous Given  09/20/23 1440)  ondansetron  (ZOFRAN ) injection 4 mg (4 mg Intravenous Given 09/20/23 1440)  propofol  (DIPRIVAN ) 10 mg/mL bolus/IV push (70 mg Intravenous Given 09/20/23 1510)                                    Medical Decision Making Amount and/or Complexity of Data Reviewed Radiology: ordered and independent interpretation performed. Decision-making details documented in ED Course.  Risk Prescription drug management.   Pt with multiple medical problems and comorbidities and presenting today with a complaint that caries a high risk for morbidity and mortality.  You are today after a fall with obvious deformity of the ankle concerning for a trimalleolar fracture and dislocation.  Currently has sensation and pulse noted in the foot.  No other areas of injury.  Patient's blood sugar is 153 and she is currently on her pump.  She had been feeling fine and seems that this fall was a trip and fall and not related to syncope or cardiac causes. 4:03 PM I have independently visualized and interpreted pt's images today.  X-ray with fracture or dislocation.  Radiology reports a bimalleolar fracture with dislocation.  Patient was sedated and reduction was done and on reduction image actually shows a trimalleolar fracture.  Radiology reports trace anteromedial subluxation of the distal  tibia in relation to the talar dome.  Will consult orthopedics for recommendations and follow-up.  Patient's pain is controlled at this time.       Final diagnoses:  Closed trimalleolar fracture of right ankle, initial encounter    ED Discharge Orders     None          Doretha Folks, MD 09/20/23 [phone]

## 2023-09-20 NOTE — ED Triage Notes (Signed)
 Patient in today reporting fall with right ankle deformity.

## 2023-09-20 NOTE — Progress Notes (Signed)
 Respiratory Therapist at [facility]  in room number __15__ during procedure.  Suction with [place] at [facility] set up and ready to use. Ambu bag at [facility] and ready to use.  Patient placed on ETCO2 Nasal Cannula at __2__ LPM.  Vitals at conclusion of procedure:  ETCO2 __26__ mmHg HR 80 RR 13 SPO2 98   Patient awake and able to verbalize name.

## 2023-09-20 NOTE — ED Provider Notes (Signed)
 Case was discussed with Dr. Beverley.  He requested a CT scan to help with operative planning.  Patient states she has crutches and a scooter at home so she can avoid putting any weight on her leg.  I have written for a prescription of Percocet.  Patient will follow-up with Dr. Hannah office tomorrow at 830    Randol Simmonds, MD 09/20/23 1747

## 2023-09-20 NOTE — Discharge Instructions (Signed)
 Take the medications for pain as needed.  Try to keep your leg elevated.  Apply ice to help with the swelling.  Follow-up with Dr. Beverley tomorrow.  He can see you in his office tomorrow morning at 830.

## 2023-09-21 ENCOUNTER — Encounter (HOSPITAL_BASED_OUTPATIENT_CLINIC_OR_DEPARTMENT_OTHER): Payer: Self-pay | Admitting: Orthopedic Surgery

## 2023-09-21 ENCOUNTER — Telehealth: Payer: Self-pay

## 2023-09-21 ENCOUNTER — Other Ambulatory Visit: Payer: Self-pay

## 2023-09-21 ENCOUNTER — Encounter (HOSPITAL_BASED_OUTPATIENT_CLINIC_OR_DEPARTMENT_OTHER)
Admission: RE | Admit: 2023-09-21 | Discharge: 2023-09-21 | Disposition: A | Discharge status: Home / Self Care | Source: Ambulatory Visit | Attending: Orthopedic Surgery | Admitting: Orthopedic Surgery

## 2023-09-21 DIAGNOSIS — I10 Essential (primary) hypertension: Secondary | ICD-10-CM | POA: Diagnosis not present

## 2023-09-21 DIAGNOSIS — S82891A Other fracture of right lower leg, initial encounter for closed fracture: Secondary | ICD-10-CM | POA: Diagnosis not present

## 2023-09-21 DIAGNOSIS — E1043 Type 1 diabetes mellitus with diabetic autonomic (poly)neuropathy: Secondary | ICD-10-CM | POA: Diagnosis not present

## 2023-09-21 DIAGNOSIS — X58XXXA Exposure to other specified factors, initial encounter: Secondary | ICD-10-CM | POA: Diagnosis not present

## 2023-09-21 DIAGNOSIS — S82854A Nondisplaced trimalleolar fracture of right lower leg, initial encounter for closed fracture: Secondary | ICD-10-CM | POA: Diagnosis not present

## 2023-09-21 DIAGNOSIS — Z794 Long term (current) use of insulin: Secondary | ICD-10-CM | POA: Diagnosis not present

## 2023-09-21 DIAGNOSIS — Z79899 Other long term (current) drug therapy: Secondary | ICD-10-CM | POA: Diagnosis not present

## 2023-09-21 DIAGNOSIS — S82871A Displaced pilon fracture of right tibia, initial encounter for closed fracture: Secondary | ICD-10-CM | POA: Diagnosis not present

## 2023-09-21 DIAGNOSIS — F319 Bipolar disorder, unspecified: Secondary | ICD-10-CM | POA: Diagnosis not present

## 2023-09-21 DIAGNOSIS — G20C Parkinsonism, unspecified: Secondary | ICD-10-CM | POA: Diagnosis not present

## 2023-09-21 DIAGNOSIS — F419 Anxiety disorder, unspecified: Secondary | ICD-10-CM | POA: Diagnosis not present

## 2023-09-21 LAB — BASIC METABOLIC PANEL WITH GFR
Anion gap: 9 (ref 5–15)
BUN: 22 mg/dL (ref 8–23)
CO2: 26 mmol/L (ref 22–32)
Calcium: 8.6 mg/dL — ABNORMAL LOW (ref 8.9–10.3)
Chloride: 102 mmol/L (ref 98–111)
Creatinine, Ser: 0.99 mg/dL (ref 0.44–1.00)
GFR, Estimated: >60 mL/min (ref >60)
Glucose, Bld: 373 mg/dL — ABNORMAL HIGH (ref 70–99)
Potassium: 4.9 mmol/L (ref 3.5–5.1)
Sodium: 137 mmol/L (ref 135–145)

## 2023-09-21 NOTE — Progress Notes (Signed)
 Glucose- 373, Dr. Jerrye aware. Notified patient that glucose should be under 300 for surgery in the morning per Dr. Kelsey instructions, pt verbalized understanding and states glucose is 199 currently.

## 2023-09-21 NOTE — H&P (Signed)
 PREOPERATIVE H&P  Chief Complaint: FRACTURE, RIGHT ANKLE  HPI: Lynn Barnes is a 63 y.o. female who presents with a diagnosis of FRACTURE, RIGHT ANKLE. Symptoms are rated as moderate to severe, and have been worsening.  This is significantly impairing activities of daily living.  She has elected for surgical management.   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)    Bipolar disorder (HCC) 02/15/2011   Cervical spondylosis without myelopathy    Depression    Depression    Diabetes mellitus    sees Dr Altheimer/endo   Dysautonomia (HCC) 10/24/2011   Dyskinesia     subacute,due to drugs   Dyslipidemia    History of nuclear stress test    Myoview 11/16: EF 81%, no ischemia; Low Risk   Hyperlipidemia    IBS (irritable bowel syndrome) 02/15/2011   Insomnia 02/21/2011   Multinodular thyroid     with large dominant solid nodule with calcifications in the lower left pole benign by FNA in 1/12 and stable by repeat ulltrasound in 4/13   Neuropathy    with paresthesias in feet   Orthostatic hypotension    Orthostatic hypotension    Parkinsonism (HCC)    due to prolonged prozac/risperdl for yrs/Dr Dohmeir   Parkinsonism (HCC)    PONV (postoperative nausea and vomiting)    POTS (postural orthostatic tachycardia syndrome)    Radiculopathy    cervical   Resting tremor    Rotator cuff syndrome of left shoulder    S/P partial hysterectomy    Spondylosis without myelopathy    Subacute dyskinesia due to drugs(333.85)    dr dohmeier/neuro   Tachycardia    syndrome   Therapy    on therapy by psychiatrist using sebutex    Thyroid  nodule    s/p biopsy - benign approx jan 2012   Tremor    Past Surgical History:  Procedure Laterality Date   ACROMIOPLASTY Left 2002   Dr Heide   BREAST EXCISIONAL BIOPSY Right    benign    BREAST LUMPECTOMY Right    herniated disc cervical spine     lower back  1992   OOPHORECTOMY     PARTIAL HYSTERECTOMY     SHOULDER SURGERY Left    SPINE SURGERY  03/2010   c5-6 fusion, Dr Burnetta   THYROID  LOBECTOMY  12/2009   nodule biopsy, benign   Social History   Socioeconomic History   Marital status: Married    Spouse name: Lenward   Number of children: 0   Years of education: BA   Highest education level: Not on file  Occupational History   Occupation: RETIRED/TEACHER    Employer: [employer]  Tobacco Use   Smoking status: Never   Smokeless tobacco: Never  Vaping Use   Vaping status: Never Used  Substance and Sexual Activity   Alcohol use: No    Alcohol/week: 0.0 standard drinks of alcohol   Drug use: No   Sexual activity: Never  Other Topics Concern   Not on file  Social History Narrative   Patient is single and lives with my partner Charlann Grampian) and also father-in-law.   Patient is disabled.   Patient is right-handed.   Patient has a Probation officer.   Patient drinks one soda every  other day.   Social Drivers of Corporate investment banker Strain: Low Risk  (06/17/2023)   Overall Financial Resource Strain (CARDIA)    Difficulty of Paying Living Expenses: Not very hard  Food Insecurity: No Food Insecurity (06/17/2023)   Hunger Vital Sign    Worried About Running Out of Food in the Last Year: Never true    Ran Out of Food in the Last Year: Never true  Transportation Needs: No Transportation Needs (06/17/2023)   PRAPARE - Administrator, Civil Service (Medical): No    Lack of Transportation (Non-Medical): No  Physical Activity: Sufficiently Active (06/17/2023)   Exercise Vital Sign    Days of Exercise per Week: 5 days    Minutes of Exercise per Session: 40 min  Stress: No Stress Concern Present (06/17/2023)   [employer] of Occupational Health - Occupational Stress Questionnaire    Feeling of Stress : Only a little  Social  Connections: Moderately Isolated (06/17/2023)   Social Connection and Isolation Panel    Frequency of Communication with Friends and Family: More than three times a week    Frequency of Social Gatherings with Friends and Family: Twice a week    Attends Religious Services: Never    Database administrator or Organizations: No    Attends Engineer, structural: Never    Marital Status: Married   Family History  Problem Relation Age of Onset   Lung cancer Mother    Heart disease Father    Hypertension Father    Pneumonia Father    Heart disease Paternal Grandmother    ALS Paternal Grandmother    Hypertension Sister    Allergies  Allergen Reactions   Klonopin [Clonazepam] Other (See Comments)    Severe weakness   Nsaids Other (See Comments)    Irritates stomach   Sulfonamide Derivatives Other (See Comments)   Sulfa Antibiotics Rash   Prior to Admission medications   Medication Sig Start Date End Date Taking? Authorizing Provider  atorvastatin  (LIPITOR) 40 MG tablet Take 1 tablet (40 mg total) by mouth daily. 12/12/22  Yes Norleen Lynwood ORN, MD  busPIRone (BUSPAR) 10 MG tablet Take 20 mg by mouth 2 (two) times daily. 03/13/22  Yes [provider]  carbidopa -levodopa  (SINEMET  CR) 50-200 MG tablet TAKE ONE TABLET BY MOUTH TWICE A DAY 07/31/23  Yes Athar, Saima, MD  ezetimibe  (ZETIA ) 10 MG tablet TAKE ONE TABLET BY MOUTH ONE TIME DAILY 06/08/23  Yes Okey Vina GAILS, MD  gabapentin  (NEURONTIN ) 400 MG capsule Take by mouth. 10/21/22  Yes [provider]  LATUDA 120 MG TABS Take 1 tablet by mouth at bedtime. 03/24/19  Yes [provider]  lisinopril  (ZESTRIL ) 20 MG tablet TAKE ONE TABLET BY MOUTH ONE TIME DAILY, TAKE AN EXTRA TABLET BY MOUTH DAILY IF FOR SYSTOLIC BLOOD PRESSURE OVER 165 08/28/23  Yes Wyn Jackee VEAR Mickey., NP  pindolol  (VISKEN ) 5 MG tablet TAKE ONE TABLET BY MOUTH TWICE A DAY 06/05/23  Yes Okey Vina GAILS, MD  risperidone  (RISPERDAL ) 4 MG tablet Take 8 mg by  mouth at bedtime. 01/15/15  Yes [provider]  trihexyphenidyl  (ARTANE ) 2 MG tablet Take 1 tablet (2 mg total) by mouth 3 (three) times daily with meals. 11/06/21  Yes Norleen Lynwood ORN, MD  fludrocortisone  (FLORINEF ) 0.1 MG tablet Take 0.5 tablets (50 mcg total) by mouth daily. 07/13/23   Okey Vina GAILS, MD  Glucagon 3 MG/DOSE POWD Place into the nose. 01/28/18  [provider]  glucose blood test strip 1 each by Other route. 7 times per day   - as instructed    [provider]  HUMALOG KWIKPEN 100 UNIT/ML KwikPen INJECT BEFORE EACH MEAL WITH 1 UNIT PER 10 GM CARBS PLUS CORRECTIN OF 1 UNIT PER 50 MG 02/25/18   [provider]  insulin degludec (TRESIBA) 100 UNIT/ML FlexTouch Pen Inject 27 Units into the skin daily.  01/28/18   [provider]  oxyCODONE -acetaminophen  (PERCOCET/ROXICET) 5-325 MG tablet Take 1 tablet by mouth every 6 (six) hours as needed for severe pain (pain score 7-10). 09/20/23   Randol Simmonds, MD  POTASSIUM CITRATE PO Take 1 capsule by mouth every other day.    [provider]  scopolamine  (TRANSDERM SCOP , 1.5 MG,) 1 MG/3DAYS Place 1 patch (1.5 mg total) onto the skin every 3 (three) days. 09/03/21   Norleen Lynwood ORN, MD  traMADol  (ULTRAM ) 50 MG tablet TAKE ONE TABLET BY MOUTH EVERY 6 HOURS AS NEEDED FOR MODERATE PAIN 04/16/23   Norleen Lynwood ORN, MD  zolpidem  (AMBIEN ) 10 MG tablet Take 10 mg by mouth at bedtime. 04/06/15   [provider]     Positive ROS: All other systems have been reviewed and were otherwise negative with the exception of those mentioned in the HPI and as above.  Physical Exam: General: Alert, no acute distress Cardiovascular: No pedal edema Respiratory: No cyanosis, no use of accessory musculature GI: No organomegaly, abdomen is soft and non-tender Skin: No lesions in the area of chief complaint Neurologic: Sensation intact distally Psychiatric: Patient is competent for consent with normal mood and  affect Lymphatic: No axillary or cervical lymphadenopathy  MUSCULOSKELETAL: in well fitted splint, can wiggle toes, NVI   Imaging: CT scan shows  an acute comminuted oblique fracture of the distal fibula. There is mild apex medial angulation. Fracture fragments are distracted 5 mm. Lateral malleolus articulates normally with the talar dome. 2.   an acute is mildly comminuted transverse and oblique fracture of the medial malleolus. The distal tibia is subluxed medially 8 mm and anterior 10 mm in relation to the talar dome. The distal medial malleolus articulates with the talar dome. 3.   an acute comminuted fracture of the posterior malleolus. Fracture fragments are distracted up to 1.4 cm   Assessment: FRACTURE, RIGHT ANKLE  Plan: Plan for Procedure(s): OPEN REDUCTION INTERNAL FIXATION (ORIF) ANKLE FRACTURE ARTHROSCOPY, ANKLE WITH DEBRIDEMENT  The risks benefits and alternatives were discussed with the patient including but not limited to the risks of nonoperative treatment, versus surgical intervention including infection, bleeding, nerve injury,  blood clots, cardiopulmonary complications, morbidity, mortality, among others, and they were willing to proceed.   Weightbearing: NWB RLE Orthopedic devices: splint Showering: POD 3 Dressing: reinforce PRN Medicines: already sent Oxy; ASA, Baclofen , Zofran   Discharge: home Follow up: 10/02/23 at 10:45am with me    Gerard CHRISTELLA Large, PA-C Office [phone] 09/21/2023 1:08 PM

## 2023-09-21 NOTE — Telephone Encounter (Signed)
   Pre-operative Risk Assessment    Patient Name: MARIANNA CID  DOB: 1960-02-27 MRN: [id number]   Date of last office visit: 05/12/23 JACKEE WYN RADDLE, NP Date of next office visit: NONE   Request for Surgical Clearance    Procedure:  ORIF RT ANKLE  Date of Surgery:  Clearance 09/22/23                                Surgeon:  EVALENE CHANCY, MD Surgeon's Group or Practice Name:  [facility] Phone number:  [phone]  EXT 3134 Fax number:  [phone]  ATTN: KELLY HIGH   Type of Clearance Requested:   - Medical    Type of Anesthesia:  CHOICE   Additional requests/questions:    Bonney Lucie DELENA Alvia   09/21/2023, 10:24 AM

## 2023-09-21 NOTE — Telephone Encounter (Signed)
   Patient Name: Lynn Barnes  DOB: 1960-05-03 MRN: [id number]  Primary Cardiologist: Vina Gull, MD  Chart reviewed as part of pre-operative protocol coverage.  Patient was last seen in clinic on 05/12/23 by Jackee Alberts, NP, she had remained stable from a cardiac standpoint.  Patient was contacted today regarding ORIF of the right ankle scheduled for 09/22/2023.  Patient reports that she has been doing well from a cardiac standpoint, she has history of dysautonomia with orthostatic hypotension.  She reports that this has been stable.  She denies chest pain, shortness of breath, lower extremity edema, fatigue, palpitations, melena, hematuria, hemoptysis, diaphoresis, weakness, presyncope, syncope, orthopnea, and PND.  She is able to achieve greater than 4 METS of activity.  Given past medical history and time since last visit, based on ACC/AHA guidelines, Lynn Barnes is at acceptable risk for the planned procedure without further cardiovascular testing.   I will route this recommendation to the requesting party via Epic fax function and remove from pre-op pool.  Please call with questions.  Laelia Angelo D Doni Widmer, NP 09/21/2023, 11:18 AM

## 2023-09-22 ENCOUNTER — Encounter (HOSPITAL_BASED_OUTPATIENT_CLINIC_OR_DEPARTMENT_OTHER)
Admission: RE | Disposition: A | Discharge status: Home / Self Care | Payer: Self-pay | Source: Home / Self Care | Attending: Orthopedic Surgery

## 2023-09-22 ENCOUNTER — Other Ambulatory Visit: Payer: Self-pay

## 2023-09-22 ENCOUNTER — Ambulatory Visit (HOSPITAL_BASED_OUTPATIENT_CLINIC_OR_DEPARTMENT_OTHER): Admitting: Anesthesiology

## 2023-09-22 ENCOUNTER — Ambulatory Visit (HOSPITAL_BASED_OUTPATIENT_CLINIC_OR_DEPARTMENT_OTHER)
Admission: RE | Admit: 2023-09-22 | Discharge: 2023-09-22 | Disposition: A | Discharge status: Home / Self Care | Attending: Orthopedic Surgery | Admitting: Orthopedic Surgery

## 2023-09-22 ENCOUNTER — Ambulatory Visit (HOSPITAL_BASED_OUTPATIENT_CLINIC_OR_DEPARTMENT_OTHER)

## 2023-09-22 ENCOUNTER — Encounter (HOSPITAL_BASED_OUTPATIENT_CLINIC_OR_DEPARTMENT_OTHER): Payer: Self-pay | Admitting: Orthopedic Surgery

## 2023-09-22 DIAGNOSIS — S82891A Other fracture of right lower leg, initial encounter for closed fracture: Secondary | ICD-10-CM | POA: Insufficient documentation

## 2023-09-22 DIAGNOSIS — F418 Other specified anxiety disorders: Secondary | ICD-10-CM

## 2023-09-22 DIAGNOSIS — S82871A Displaced pilon fracture of right tibia, initial encounter for closed fracture: Secondary | ICD-10-CM | POA: Diagnosis not present

## 2023-09-22 DIAGNOSIS — Z794 Long term (current) use of insulin: Secondary | ICD-10-CM | POA: Diagnosis not present

## 2023-09-22 DIAGNOSIS — I10 Essential (primary) hypertension: Secondary | ICD-10-CM | POA: Diagnosis not present

## 2023-09-22 DIAGNOSIS — X58XXXA Exposure to other specified factors, initial encounter: Secondary | ICD-10-CM | POA: Insufficient documentation

## 2023-09-22 DIAGNOSIS — Z79899 Other long term (current) drug therapy: Secondary | ICD-10-CM | POA: Insufficient documentation

## 2023-09-22 DIAGNOSIS — M24171 Other articular cartilage disorders, right ankle: Secondary | ICD-10-CM | POA: Diagnosis not present

## 2023-09-22 DIAGNOSIS — F319 Bipolar disorder, unspecified: Secondary | ICD-10-CM | POA: Diagnosis not present

## 2023-09-22 DIAGNOSIS — G8918 Other acute postprocedural pain: Secondary | ICD-10-CM | POA: Diagnosis not present

## 2023-09-22 DIAGNOSIS — S93431A Sprain of tibiofibular ligament of right ankle, initial encounter: Secondary | ICD-10-CM | POA: Diagnosis not present

## 2023-09-22 DIAGNOSIS — F419 Anxiety disorder, unspecified: Secondary | ICD-10-CM | POA: Insufficient documentation

## 2023-09-22 DIAGNOSIS — E1043 Type 1 diabetes mellitus with diabetic autonomic (poly)neuropathy: Secondary | ICD-10-CM | POA: Diagnosis not present

## 2023-09-22 DIAGNOSIS — Z01818 Encounter for other preprocedural examination: Secondary | ICD-10-CM

## 2023-09-22 DIAGNOSIS — G20C Parkinsonism, unspecified: Secondary | ICD-10-CM | POA: Insufficient documentation

## 2023-09-22 DIAGNOSIS — M24071 Loose body in right ankle: Secondary | ICD-10-CM | POA: Diagnosis not present

## 2023-09-22 HISTORY — PX: ORIF ANKLE FRACTURE: SHX5408

## 2023-09-22 HISTORY — PX: ARTHROSCOPY, ANKLE WITH DEBRIDEMENT: SHX7318

## 2023-09-22 HISTORY — DX: Other specified postprocedural states: Z98.890

## 2023-09-22 LAB — GLUCOSE, CAPILLARY
Glucose-Capillary: 241 mg/dL — ABNORMAL HIGH (ref 70–99)
Glucose-Capillary: 234 mg/dL — ABNORMAL HIGH (ref 70–99)

## 2023-09-22 SURGERY — OPEN REDUCTION INTERNAL FIXATION (ORIF) ANKLE FRACTURE
Anesthesia: General | Site: Ankle | Laterality: Right

## 2023-09-22 MED ORDER — 0.9 % SODIUM CHLORIDE (POUR BTL) OPTIME
TOPICAL | Status: DC | PRN
  Administered 2023-09-22 (×2): 1000 mL

## 2023-09-22 MED ORDER — FENTANYL CITRATE (PF) 100 MCG/2ML IJ SOLN
INTRAMUSCULAR | Status: AC
  Filled 2023-09-22: qty 2

## 2023-09-22 MED ORDER — DEXMEDETOMIDINE HCL IN NACL 80 MCG/20ML IV SOLN
INTRAVENOUS | Status: AC
  Filled 2023-09-22: qty 20

## 2023-09-22 MED ORDER — MIDAZOLAM HCL 2 MG/2ML IJ SOLN
INTRAMUSCULAR | Status: AC
  Filled 2023-09-22: qty 2

## 2023-09-22 MED ORDER — SODIUM CHLORIDE 0.9 % IV SOLN: 12.5000 mg | INTRAVENOUS | Status: DC | PRN

## 2023-09-22 MED ORDER — LACTATED RINGERS IV SOLN: INTRAVENOUS | Status: DC

## 2023-09-22 MED ORDER — FENTANYL CITRATE (PF) 100 MCG/2ML IJ SOLN: 25.0000 ug | INTRAMUSCULAR | Status: DC | PRN

## 2023-09-22 MED ORDER — DEXAMETHASONE SODIUM PHOSPHATE 10 MG/ML IJ SOLN
INTRAMUSCULAR | Status: AC
  Filled 2023-09-22: qty 1

## 2023-09-22 MED ORDER — PHENYLEPHRINE HCL-NACL 20-0.9 MG/250ML-% IV SOLN
INTRAVENOUS | Status: DC | PRN
  Administered 2023-09-22 (×2): 40 ug/min via INTRAVENOUS

## 2023-09-22 MED ORDER — TRANEXAMIC ACID-NACL 1000-0.7 MG/100ML-% IV SOLN
1000.0000 mg | INTRAVENOUS | Status: AC
  Administered 2023-09-22 (×2): 1000 mg via INTRAVENOUS

## 2023-09-22 MED ORDER — KETAMINE HCL 50 MG/5ML IJ SOSY
PREFILLED_SYRINGE | INTRAMUSCULAR | Status: AC
  Filled 2023-09-22: qty 5

## 2023-09-22 MED ORDER — ACETAMINOPHEN 500 MG PO TABS: 1000.0000 mg | ORAL_TABLET | Freq: Four times a day (QID) | ORAL | 0 refills | Status: AC | PRN

## 2023-09-22 MED ORDER — BACLOFEN 10 MG PO TABS: 10.0000 mg | ORAL_TABLET | Freq: Three times a day (TID) | ORAL | 0 refills | Status: AC | PRN

## 2023-09-22 MED ORDER — CEFAZOLIN SODIUM-DEXTROSE 2-4 GM/100ML-% IV SOLN
INTRAVENOUS | Status: AC
  Filled 2023-09-22: qty 100

## 2023-09-22 MED ORDER — LIDOCAINE 2% (20 MG/ML) 5 ML SYRINGE
INTRAMUSCULAR | Status: AC
  Filled 2023-09-22: qty 5

## 2023-09-22 MED ORDER — ACETAMINOPHEN 500 MG PO TABS: 1000.0000 mg | ORAL_TABLET | Freq: Once | ORAL | Status: DC

## 2023-09-22 MED ORDER — FENTANYL CITRATE (PF) 100 MCG/2ML IJ SOLN
50.0000 ug | Freq: Once | INTRAMUSCULAR | Status: AC
  Administered 2023-09-22 (×2): 50 ug via INTRAVENOUS

## 2023-09-22 MED ORDER — BUPIVACAINE-EPINEPHRINE (PF) 0.5% -1:200000 IJ SOLN
INTRAMUSCULAR | Status: DC | PRN
  Administered 2023-09-22: 15 mL via PERINEURAL
  Administered 2023-09-22: 25 mL via PERINEURAL
  Administered 2023-09-22: 15 mL via PERINEURAL
  Administered 2023-09-22: 25 mL via PERINEURAL

## 2023-09-22 MED ORDER — CEFAZOLIN SODIUM-DEXTROSE 2-4 GM/100ML-% IV SOLN
2.0000 g | INTRAVENOUS | Status: AC
  Administered 2023-09-22 (×2): 2 g via INTRAVENOUS

## 2023-09-22 MED ORDER — GLYCOPYRROLATE PF 0.2 MG/ML IJ SOSY
PREFILLED_SYRINGE | INTRAMUSCULAR | Status: AC
  Filled 2023-09-22: qty 1

## 2023-09-22 MED ORDER — ONDANSETRON HCL 4 MG/2ML IJ SOLN
INTRAMUSCULAR | Status: DC | PRN
  Administered 2023-09-22 (×4): 4 mg via INTRAVENOUS

## 2023-09-22 MED ORDER — MIDAZOLAM HCL 2 MG/2ML IJ SOLN
2.0000 mg | Freq: Once | INTRAMUSCULAR | Status: AC
  Administered 2023-09-22 (×2): 2 mg via INTRAVENOUS

## 2023-09-22 MED ORDER — PROPOFOL 10 MG/ML IV BOLUS
INTRAVENOUS | Status: DC | PRN
  Administered 2023-09-22 (×2): 200 ug via INTRAVENOUS

## 2023-09-22 MED ORDER — DEXMEDETOMIDINE HCL IN NACL 80 MCG/20ML IV SOLN
INTRAVENOUS | Status: DC | PRN
  Administered 2023-09-22 (×2): 4 ug via INTRAVENOUS
  Administered 2023-09-22 (×2): 10 ug via INTRAVENOUS

## 2023-09-22 MED ORDER — VANCOMYCIN HCL 500 MG IV SOLR
INTRAVENOUS | Status: DC | PRN
Start: 2023-09-22 — End: 2023-09-22
  Administered 2023-09-22 (×2): 500 mg via TOPICAL

## 2023-09-22 MED ORDER — PHENYLEPHRINE 80 MCG/ML (10ML) SYRINGE FOR IV PUSH (FOR BLOOD PRESSURE SUPPORT)
PREFILLED_SYRINGE | INTRAVENOUS | Status: AC
  Filled 2023-09-22: qty 10

## 2023-09-22 MED ORDER — AMISULPRIDE (ANTIEMETIC) 5 MG/2ML IV SOLN: 10.0000 mg | Freq: Once | INTRAVENOUS | Status: DC | PRN

## 2023-09-22 MED ORDER — KETAMINE HCL 10 MG/ML IJ SOLN
INTRAMUSCULAR | Status: DC | PRN
  Administered 2023-09-22 (×2): 30 mg via INTRAVENOUS
  Administered 2023-09-22 (×2): 20 mg via INTRAVENOUS

## 2023-09-22 MED ORDER — PHENYLEPHRINE HCL (PRESSORS) 10 MG/ML IV SOLN
INTRAVENOUS | Status: DC | PRN
  Administered 2023-09-22 (×2): 160 ug via INTRAVENOUS

## 2023-09-22 MED ORDER — OMEPRAZOLE MAGNESIUM 20 MG PO TBEC: 20.0000 mg | DELAYED_RELEASE_TABLET | Freq: Every day | ORAL | 0 refills | Status: AC

## 2023-09-22 MED ORDER — LIDOCAINE 2% (20 MG/ML) 5 ML SYRINGE
INTRAMUSCULAR | Status: DC | PRN
  Administered 2023-09-22 (×2): 60 mg via INTRAVENOUS

## 2023-09-22 MED ORDER — ONDANSETRON 4 MG PO TBDP: 4.0000 mg | ORAL_TABLET | Freq: Three times a day (TID) | ORAL | 0 refills | Status: AC | PRN

## 2023-09-22 MED ORDER — ONDANSETRON HCL 4 MG/2ML IJ SOLN
INTRAMUSCULAR | Status: AC
  Filled 2023-09-22: qty 2

## 2023-09-22 MED ORDER — POVIDONE-IODINE 10 % EX SWAB: 2.0000 | Freq: Once | CUTANEOUS | Status: DC

## 2023-09-22 MED ORDER — DEXAMETHASONE SODIUM PHOSPHATE 10 MG/ML IJ SOLN
INTRAMUSCULAR | Status: DC | PRN
  Administered 2023-09-22 (×2): 10 mg via INTRAVENOUS

## 2023-09-22 MED ORDER — TRANEXAMIC ACID-NACL 1000-0.7 MG/100ML-% IV SOLN
INTRAVENOUS | Status: AC
  Filled 2023-09-22: qty 100

## 2023-09-22 MED ORDER — PROPOFOL 500 MG/50ML IV EMUL
INTRAVENOUS | Status: DC | PRN
  Administered 2023-09-22 (×2): 125 ug/kg/min via INTRAVENOUS

## 2023-09-22 MED ORDER — PROPOFOL 1000 MG/100ML IV EMUL
INTRAVENOUS | Status: AC
  Filled 2023-09-22: qty 100

## 2023-09-22 MED ORDER — APIXABAN 2.5 MG PO TABS: 2.5000 mg | ORAL_TABLET | Freq: Two times a day (BID) | ORAL | 0 refills | Status: AC

## 2023-09-22 SURGICAL SUPPLY — 76 items
BIT DRILL 2.6X VARIAX 2 (BIT) IMPLANT
BIT DRILL SRG 2.7XCANN AO CPLG (BIT) IMPLANT
BLADE EXCALIBUR 4.0X13 (MISCELLANEOUS) IMPLANT
BLADE SURG 15 STRL LF DISP TIS (BLADE) ×4 IMPLANT
BNDG COHESIVE 4X5 TAN STRL LF (GAUZE/BANDAGES/DRESSINGS) ×2 IMPLANT
BNDG COMPR ESMARK 4X3 LF (GAUZE/BANDAGES/DRESSINGS) ×2 IMPLANT
BNDG COMPR ESMARK 6X3 LF (GAUZE/BANDAGES/DRESSINGS) ×2 IMPLANT
BNDG ELASTIC 4INX 5YD STR LF (GAUZE/BANDAGES/DRESSINGS) ×2 IMPLANT
BNDG ELASTIC 6INX 5YD STR LF (GAUZE/BANDAGES/DRESSINGS) ×2 IMPLANT
BURR OVAL 8 FLU 5.0X13 (MISCELLANEOUS) IMPLANT
CHLORAPREP W/TINT 26 (MISCELLANEOUS) ×2 IMPLANT
CLSR STERI-STRIP ANTIMIC 1/2X4 (GAUZE/BANDAGES/DRESSINGS) ×2 IMPLANT
COVER BACK TABLE 60X90IN (DRAPES) ×2 IMPLANT
CUFF TRNQT CYL 24X4X16.5-23 (TOURNIQUET CUFF) IMPLANT
CUFF TRNQT CYL 34X4.125X (TOURNIQUET CUFF) IMPLANT
DISSECTOR 3.8MM X 13CM (MISCELLANEOUS) IMPLANT
DISSECTOR 4.0MM X 13CM (MISCELLANEOUS) IMPLANT
DRAPE ARTHROSCOPY W/POUCH 90 (DRAPES) ×2 IMPLANT
DRAPE EXTREMITY T 121X128X90 (DISPOSABLE) ×2 IMPLANT
DRAPE IMP U-DRAPE 54X76 (DRAPES) ×2 IMPLANT
DRAPE OEC MINIVIEW 54X84 (DRAPES) ×2 IMPLANT
DRAPE SURG 17X23 STRL (DRAPES) ×2 IMPLANT
DRAPE U-SHAPE 47X51 STRL (DRAPES) ×2 IMPLANT
DRSG EMULSION OIL 3X3 NADH (GAUZE/BANDAGES/DRESSINGS) ×2 IMPLANT
ELECTRODE REM PT RTRN 9FT ADLT (ELECTROSURGICAL) ×2 IMPLANT
EXCALIBUR 3.8MM X 13CM (MISCELLANEOUS) IMPLANT
GAUZE PAD ABD 8X10 STRL (GAUZE/BANDAGES/DRESSINGS) ×2 IMPLANT
GAUZE SPONGE 4X4 12PLY STRL (GAUZE/BANDAGES/DRESSINGS) ×4 IMPLANT
GLOVE BIO SURGEON STRL SZ7.5 (GLOVE) ×2 IMPLANT
GLOVE BIOGEL PI IND STRL 7.0 (GLOVE) ×2 IMPLANT
GLOVE BIOGEL PI IND STRL 8 (GLOVE) ×2 IMPLANT
GLOVE SURG SYN 7.0 PF PI (GLOVE) ×2 IMPLANT
GOWN STRL REUS W/ TWL LRG LVL3 (GOWN DISPOSABLE) ×4 IMPLANT
GOWN STRL REUS W/ TWL XL LVL3 (GOWN DISPOSABLE) ×2 IMPLANT
KWIRE FX150X1.25XNS LF SS (WIRE) IMPLANT
MANIFOLD NEPTUNE II (INSTRUMENTS) IMPLANT
NDL HYPO 22X1.5 SAFETY MO (MISCELLANEOUS) IMPLANT
NEEDLE HYPO 22X1.5 SAFETY MO (MISCELLANEOUS) IMPLANT
NS IRRIG 1000ML POUR BTL (IV SOLUTION) ×2 IMPLANT
PACK ARTHROSCOPY DSU (CUSTOM PROCEDURE TRAY) ×2 IMPLANT
PACK BASIN DAY SURGERY FS (CUSTOM PROCEDURE TRAY) ×2 IMPLANT
PAD CAST 4YDX4 CTTN HI CHSV (CAST SUPPLIES) ×2 IMPLANT
PADDING CAST ABS COTTON 4X4 ST (CAST SUPPLIES) ×4 IMPLANT
PADDING CAST COTTON 6X4 STRL (CAST SUPPLIES) ×2 IMPLANT
PENCIL SMOKE EVACUATOR (MISCELLANEOUS) ×2 IMPLANT
PLATE FIBULA 5H (Plate) IMPLANT
SCREW BN T10 FT 14X3.5XSTRDR (Screw) IMPLANT
SCREW BONE CANN 4.0X50MM (Screw) IMPLANT
SCREW BONE THRD T10 3.5X12 (Screw) IMPLANT
SCREW CANNULATED 4.0 (Screw) IMPLANT
SCREW LOCK HEX T10 3.5X12 (Screw) IMPLANT
SHAVER DISSECTOR 3.0 (BURR) IMPLANT
SLEEVE SCD COMPRESS KNEE MED (STOCKING) IMPLANT
SOL .9 NS 3000ML IRR UROMATIC (IV SOLUTION) IMPLANT
SPIKE FLUID TRANSFER (MISCELLANEOUS) IMPLANT
SPLINT PLASTER CAST FAST 5X30 (CAST SUPPLIES) ×40 IMPLANT
SPONGE T-LAP 4X18 ~~LOC~~+RFID (SPONGE) ×2 IMPLANT
STOCKINETTE 4X48 STRL (DRAPES) ×2 IMPLANT
STRAP ANKLE FOOT DISTRACTOR (ORTHOPEDIC SUPPLIES) IMPLANT
SUCTION TUBE FRAZIER 10FR DISP (SUCTIONS) ×2 IMPLANT
SUT ETHILON 3 0 PS 1 (SUTURE) ×2 IMPLANT
SUT MNCRL AB 4-0 PS2 18 (SUTURE) IMPLANT
SUT MON AB 2-0 CT1 36 (SUTURE) IMPLANT
SUT MON AB 3-0 SH27 (SUTURE) IMPLANT
SUT VIC AB 2-0 SH 27XBRD (SUTURE) IMPLANT
SUT VIC AB 3-0 SH 27X BRD (SUTURE) IMPLANT
SUT VICRYL 0 SH 27 (SUTURE) ×2 IMPLANT
SYNDESMOSIS TIGHTROPE XP (Orthopedic Implant) IMPLANT
SYR BULB EAR ULCER 3OZ GRN STR (SYRINGE) ×2 IMPLANT
SYR CONTROL 10ML LL (SYRINGE) IMPLANT
TOWEL GREEN STERILE FF (TOWEL DISPOSABLE) ×4 IMPLANT
TUBE CONNECTING 20X1/4 (TUBING) ×2 IMPLANT
TUBING ARTHROSCOPY IRRIG 16FT (MISCELLANEOUS) ×2 IMPLANT
UNDERPAD 30X36 HEAVY ABSORB (UNDERPADS AND DIAPERS) ×2 IMPLANT
WAND ABLATOR APOLLO I90 (BUR) IMPLANT
WATER STERILE IRR 1000ML POUR (IV SOLUTION) ×2 IMPLANT

## 2023-09-22 NOTE — Anesthesia Postprocedure Evaluation (Signed)
 Anesthesia Post Note  Patient: Lynn Barnes  Procedure(s) Performed: OPEN REDUCTION INTERNAL FIXATION (ORIF) ANKLE FRACTURE (Right: Ankle) ARTHROSCOPY, ANKLE WITH DEBRIDEMENT (Right: Ankle)     Patient location during evaluation: PACU Anesthesia Type: General Level of consciousness: awake and alert Pain management: pain level controlled Vital Signs Assessment: post-procedure vital signs reviewed and stable Respiratory status: spontaneous breathing, nonlabored ventilation and respiratory function stable Cardiovascular status: stable and blood pressure returned to baseline Anesthetic complications: no   No notable events documented.  Last Vitals:  Vitals:   09/22/23 1330 09/22/23 1345  BP: (!) 163/74 (!) 158/82  Pulse: (!) 101 (!) 101  Resp: 15 20  Temp:    SpO2: 100% 95%    Last Pain:  Vitals:   09/22/23 1345  TempSrc:   PainSc: 0-No pain                 Debby FORBES Like

## 2023-09-22 NOTE — Transfer of Care (Signed)
 Immediate Anesthesia Transfer of Care Note  Patient: Lynn Barnes  Procedure(s) Performed: OPEN REDUCTION INTERNAL FIXATION (ORIF) ANKLE FRACTURE (Right: Ankle) ARTHROSCOPY, ANKLE WITH DEBRIDEMENT (Right: Ankle)  Patient Location: PACU  Anesthesia Type:GA combined with regional for post-op pain  Level of Consciousness: drowsy, patient cooperative, and responds to stimulation  Airway & Oxygen Therapy: Patient Spontanous Breathing and Patient connected to face mask oxygen  Post-op Assessment: Report given to RN and Post -op Vital signs reviewed and stable  Post vital signs: Reviewed and stable  Last Vitals:  Vitals Value Taken Time  BP 144/59 09/22/23 13:15  Temp 36.8 C 09/22/23 13:15  Pulse 101 09/22/23 13:30  Resp 15 09/22/23 13:30  SpO2 100 % 09/22/23 13:30  Vitals shown include unfiled device data.  Last Pain:  Vitals:   09/22/23 0935  TempSrc: Temporal  PainSc: 7       Patients Stated Pain Goal: 3 (09/22/23 0935)  Complications: No notable events documented.

## 2023-09-22 NOTE — Discharge Instructions (Addendum)

## 2023-09-22 NOTE — Progress Notes (Signed)
 Assisted Dr. Lucious with right, adductor canal, popliteal, ultrasound guided block. Side rails up, monitors on throughout procedure. See vital signs in flow sheet. Tolerated Procedure well.

## 2023-09-22 NOTE — Op Note (Signed)
 09/22/2023  12:48 PM  PATIENT:  Lynn Barnes    PRE-OPERATIVE DIAGNOSIS:  FRACTURE, RIGHT ANKLE  POST-OPERATIVE DIAGNOSIS:  Same  PROCEDURE:  OPEN REDUCTION INTERNAL FIXATION (ORIF) ANKLE FRACTURE, ARTHROSCOPY, ANKLE WITH DEBRIDEMENT  SURGEON:  Evalene JONETTA Chancy, MD  ASSISTANT: Gerard Large, PA-C, he was present and scrubbed throughout the case, critical for completion in a timely fashion, and for retraction, instrumentation, and closure.   ANESTHESIA:   General Plus block  PREOPERATIVE INDICATIONS:  Lynn Barnes is a  63 y.o. female with a diagnosis of FRACTURE, RIGHT ANKLE who failed conservative measures and elected for surgical management.    The risks benefits and alternatives were discussed with the patient preoperatively including but not limited to the risks of infection, bleeding, nerve injury, cardiopulmonary complications, the need for revision surgery, among others, and the patient was willing to proceed.  OPERATIVE IMPLANTS: Stryker plate screws Arthrex tight rope  OPERATIVE FINDINGS: Unstable fracture loose body in the ankle  BLOOD LOSS: Minimal  COMPLICATIONS: None  TOURNIQUET TIME: Roughly 75 minutes  OPERATIVE PROCEDURE:  Patient was identified in the preoperative holding area and site was marked by me She was transported to the operating theater and placed on the table in supine position taking care to pad all bony prominences. After a preincinduction time out anesthesia was induced. The right lower extremity was prepped and draped in normal sterile fashion and a pre-incision timeout was performed. She received Ancef  for preoperative antibiotics.   I first made 2 arthroscopic portals at the anterior ankle protecting all neurovascular structures I made an anterior medial and anterior lateral portal and inserted the arthroscope.  I performed an extensive debridement of synovium chondral damage and hematoma identified the posterior piece of the pilon fracture  there was a chondral piece with minimal bone that was not stable I removed this as it was on the posterior lateral aspect of the articular margin I was able to then reduce the posterior aspect of the pilon well and stabilize that with a K wire and later a cannulated screw  Next I turned my attention to the fibula I placed a lateral incision there was significant comminution I was able to get a locking plate distally and secured length confirmed with fluoroscopy I then fixed it proximally as well with multiple screws  Next I made a medial incision removed soft tissue from within the medial mall fracture reduced this placed a K wire and a cannulated screw.  I stressed to the syndesmosis on fluoroscopy it was not stable so I placed a tight rope across the syndesmosis  I took multiple x-rays of the pilon reduction the medial and lateral mall fixation I was happy with all of the hardware placement and fracture reduction incisions were all then thoroughly irrigated and closed sterile dressing and a splint placed  POST OPERATIVE PLAN: Nonweightbearing elevate

## 2023-09-22 NOTE — Interval H&P Note (Signed)
 History and Physical Interval Note:  09/22/2023 9:28 AM  Lynn Barnes  has presented today for surgery, with the diagnosis of FRACTURE, RIGHT ANKLE.  The various methods of treatment have been discussed with the patient and family. After consideration of risks, benefits and other options for treatment, the patient has consented to  Procedure(s): OPEN REDUCTION INTERNAL FIXATION (ORIF) ANKLE FRACTURE (Right) ARTHROSCOPY, ANKLE WITH DEBRIDEMENT (Right) as a surgical intervention.  The patient's history has been reviewed, patient examined, no change in status, stable for surgery.  I have reviewed the patient's chart and labs.  Questions were answered to the patient's satisfaction.     Lynn Barnes

## 2023-09-22 NOTE — Anesthesia Procedure Notes (Signed)
 Anesthesia Regional Block: Adductor canal block   Pre-Anesthetic Checklist: , timeout performed,  Correct Patient, Correct Site, Correct Laterality,  Correct Procedure, Correct Position, site marked,  Risks and benefits discussed,  Surgical consent,  Pre-op evaluation,  At surgeon's request and post-op pain management  Laterality: Right  Prep: chloraprep       Needles:  Injection technique: Single-shot  Needle Type: Echogenic Needle     Needle Length: 10cm  Needle Gauge: 21     Additional Needles:   Narrative:  Start time: 09/22/2023 10:16 AM End time: 09/22/2023 10:19 AM Injection made incrementally with aspirations every 5 mL.  Performed by: Personally  Anesthesiologist: Lucious Debby BRAVO, MD  Additional Notes: No pain on injection. No increased resistance to injection. Injection made in 5cc increments. Good needle visualization. Patient tolerated the procedure well.

## 2023-09-22 NOTE — Anesthesia Preprocedure Evaluation (Addendum)
 Anesthesia Evaluation  Patient identified by MRN, date of birth, ID band Patient awake    Reviewed: Allergy & Precautions, NPO status , Patient's Chart, lab work & pertinent test results, reviewed documented beta blocker date and time   History of Anesthesia Complications (+) PONV and history of anesthetic complications  Airway Mallampati: II  TM Distance: >3 FB Neck ROM: Full    Dental  (+) Dental Advisory Given, Teeth Intact   Pulmonary neg pulmonary ROS   Pulmonary exam normal        Cardiovascular hypertension, Pt. on medications and Pt. on home beta blockers Normal cardiovascular exam   POTS    Neuro/Psych  PSYCHIATRIC DISORDERS Anxiety Depression Bipolar Disorder    Hx subacute dyskinesia Resting tremor Parkinsonism   Neuromuscular disease    GI/Hepatic negative GI ROS,,,(+)     substance abuse    Endo/Other  diabetes, Type 1, Insulin Dependent    Renal/GU negative Renal ROS     Musculoskeletal negative musculoskeletal ROS (+)  narcotic dependent  Abdominal   Peds  Hematology negative hematology ROS (+)   Anesthesia Other Findings Subutex use   Reproductive/Obstetrics                              Anesthesia Physical Anesthesia Plan  ASA: 3  Anesthesia Plan: General   Post-op Pain Management: Tylenol  PO (pre-op)* and Regional block*   Induction: Intravenous  PONV Risk Score and Plan: 4 or greater and Treatment may vary due to age or medical condition, Ondansetron , Dexamethasone  and Midazolam   Airway Management Planned: LMA  Additional Equipment: None  Intra-op Plan:   Post-operative Plan: Extubation in OR  Informed Consent: I have reviewed the patients History and Physical, chart, labs and discussed the procedure including the risks, benefits and alternatives for the proposed anesthesia with the patient or authorized representative who has indicated his/her  understanding and acceptance.     Dental advisory given  Plan Discussed with: CRNA and Anesthesiologist  Anesthesia Plan Comments:          Anesthesia Quick Evaluation

## 2023-09-22 NOTE — Anesthesia Procedure Notes (Signed)
 Anesthesia Regional Block: Popliteal block   Pre-Anesthetic Checklist: , timeout performed,  Correct Patient, Correct Site, Correct Laterality,  Correct Procedure, Correct Position, site marked,  Risks and benefits discussed,  Surgical consent,  Pre-op evaluation,  At surgeon's request and post-op pain management  Laterality: Right  Prep: chloraprep       Needles:  Injection technique: Single-shot  Needle Type: Echogenic Needle     Needle Length: 10cm  Needle Gauge: 21     Additional Needles:   Narrative:  Start time: 09/22/2023 10:20 AM End time: 09/22/2023 10:23 AM Injection made incrementally with aspirations every 5 mL.  Performed by: Personally  Anesthesiologist: Lucious Debby BRAVO, MD  Additional Notes: No pain on injection. No increased resistance to injection. Injection made in 5cc increments. Good needle visualization. Patient tolerated the procedure well.

## 2023-09-23 ENCOUNTER — Encounter (HOSPITAL_BASED_OUTPATIENT_CLINIC_OR_DEPARTMENT_OTHER): Payer: Self-pay | Admitting: Orthopedic Surgery

## 2023-10-02 DIAGNOSIS — M24071 Loose body in right ankle: Secondary | ICD-10-CM | POA: Diagnosis not present

## 2023-10-03 ENCOUNTER — Other Ambulatory Visit: Payer: Self-pay | Admitting: Internal Medicine

## 2023-10-09 ENCOUNTER — Other Ambulatory Visit: Payer: Self-pay | Admitting: Neurology

## 2023-10-16 DIAGNOSIS — S82871D Displaced pilon fracture of right tibia, subsequent encounter for closed fracture with routine healing: Secondary | ICD-10-CM | POA: Diagnosis not present

## 2023-10-30 DIAGNOSIS — S82871D Displaced pilon fracture of right tibia, subsequent encounter for closed fracture with routine healing: Secondary | ICD-10-CM | POA: Diagnosis not present

## 2023-11-06 ENCOUNTER — Telehealth: Payer: Self-pay | Admitting: Radiology

## 2023-11-06 DIAGNOSIS — E2839 Other primary ovarian failure: Secondary | ICD-10-CM

## 2023-11-06 NOTE — Telephone Encounter (Signed)
 Copied from CRM [id number]. Topic: Clinical - Request for Lab/Test Order >> Nov 05, 2023  3:00 PM Chiquita SQUIBB wrote: Reason for CRM: Patient is calling in stating that Dr. Norleen recommended a bone density scan but the patient has not heard from anyone regarding the test, no active orders in the chart. Please advise the patient.

## 2023-11-09 NOTE — Telephone Encounter (Unsigned)
 Copied from CRM [id number]. Topic: Referral - Question >> Nov 09, 2023  2:32 PM Aisha D wrote: Reason for CRM:  Patient is calling in stating that Dr. Norleen recommended a bone density scan but the patient has not heard from anyone regarding the test, no active orders in the chart. Please advise the patient.  Update: Pt is calling back to confirm if a referral for a bone density scan will be submitted. Pt stated that she has been calling but has not received a response and would like a callback with an update.

## 2023-11-10 NOTE — Telephone Encounter (Signed)
 Ok this is ordered again

## 2023-11-10 NOTE — Addendum Note (Signed)
 Addended by: NORLEEN LYNWOOD ORN on: 11/10/2023 11:09 AM   Modules accepted: Orders

## 2023-11-16 ENCOUNTER — Ambulatory Visit (INDEPENDENT_AMBULATORY_CARE_PROVIDER_SITE_OTHER)
Admission: RE | Admit: 2023-11-16 | Discharge: 2023-11-16 | Disposition: A | Discharge status: Home / Self Care | Source: Ambulatory Visit | Attending: Internal Medicine | Admitting: Internal Medicine

## 2023-11-16 DIAGNOSIS — E2839 Other primary ovarian failure: Secondary | ICD-10-CM | POA: Diagnosis not present

## 2023-11-17 ENCOUNTER — Ambulatory Visit: Payer: Self-pay | Admitting: Internal Medicine

## 2023-11-18 NOTE — Therapy (Signed)
 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    Addiction, opium  (HCC)    Allergy    spring   Anxiety    ADHD CHRONIC ANXIETY   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 (HCC)    Bipolar disorder (HCC) 02/15/2011   Cervical spondylosis without myelopathy    Depression    Depression    Diabetes mellitus    sees Dr Altheimer/endo   Dysautonomia (HCC) 10/24/2011   Dyskinesia     subacute,due to drugs   Dyslipidemia    History of nuclear stress test    Myoview 11/16: EF 81%, no ischemia; Low Risk   Hyperlipidemia    IBS (irritable bowel syndrome) 02/15/2011   Insomnia 02/21/2011   Multinodular thyroid     with large dominant solid nodule with calcifications in the lower left pole benign by FNA in 1/12 and stable by repeat ulltrasound in 4/13   Neuropathy    with paresthesias in feet   Orthostatic hypotension    Orthostatic hypotension    Parkinsonism (HCC)    due to prolonged prozac/risperdl for yrs/Dr Dohmeir   Parkinsonism (HCC)    PONV (postoperative nausea and vomiting)    POTS (postural orthostatic tachycardia syndrome)    Radiculopathy    cervical   Resting tremor    Rotator cuff syndrome of left shoulder    S/P partial hysterectomy    Spondylosis without myelopathy    Subacute dyskinesia due to drugs(333.85)    dr dohmeier/neuro   Tachycardia    syndrome   Therapy    on therapy by psychiatrist using sebutex     Thyroid  nodule    s/p biopsy - benign approx jan 2012   Tremor    Past Surgical History:  Procedure Laterality Date   ACROMIOPLASTY Left 2002   Dr Heide   ARTHROSCOPY, ANKLE WITH DEBRIDEMENT Right 09/22/2023   Procedure: ARTHROSCOPY, ANKLE WITH DEBRIDEMENT;  Surgeon: Beverley Evalene BIRCH, MD;  Location: [facility];  Service: Orthopedics;  Laterality: Right;   BREAST EXCISIONAL BIOPSY Right    benign   BREAST LUMPECTOMY Right    herniated disc cervical spine     lower back  1992   OOPHORECTOMY     ORIF ANKLE FRACTURE Right 09/22/2023   Procedure: OPEN REDUCTION INTERNAL FIXATION (ORIF) ANKLE FRACTURE;  Surgeon: Beverley Evalene BIRCH, MD;  Location: [facility];  Service: Orthopedics;  Laterality: Right;   PARTIAL HYSTERECTOMY     SHOULDER SURGERY Left    SPINE SURGERY  03/2010   c5-6 fusion, Dr Burnetta   THYROID  LOBECTOMY  12/2009   nodule biopsy, benign   Patient Active Problem List   Diagnosis Date Noted   Acute URI 11/06/2022   Acquired deformity of lower leg 10/10/2022   HTN (hypertension) 11/09/2021  Hypoglycemia unawareness associated with type 1 diabetes mellitus (HCC) 05/24/2021   Blister of right heel 09/07/2020   Uses self-applied continuous glucose monitoring device 04/05/2020   Posterior neck pain 09/23/2018   Parkinsonism with orthostatic hypotension (HCC) 01/05/2018   Type 1 diabetes mellitus with hyperglycemia (HCC) 06/03/2016   Insulin pump status 03/03/2016   Mixed dyslipidemia 03/03/2016   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) 04/26/2015   Severe recurrent major depressive disorder with psychotic features (HCC) 04/26/2015   Parkinsonian syndrome associated with symptomatic orthostatic hypotension (HCC) 04/26/2015   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 02/21/2015   Allergic rhinitis 02/20/2015   Regurgitation of food 08/17/2014   Memory dysfunction 08/17/2014   Lumbar degenerative disc disease 12/20/2013    Greater trochanteric bursitis of right hip 03/23/2013   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 (HCC)    POTS (postural orthostatic tachycardia syndrome) 03/06/2012   Subacute dyskinesia due to drug 10/24/2011   Subacute dyskinesia due to drug 10/24/2011   ADD (attention deficit disorder) 02/23/2011   Insomnia 02/21/2011   Syncope 02/19/2011   Parkinsonism (HCC)    Thyroid  nodule    Encounter for well adult exam with abnormal findings 02/15/2011   IBS (irritable bowel syndrome) 02/15/2011   Bipolar disorder (HCC) 02/15/2011   Brachial neuritis or radiculitis 02/08/2010   Neuropathy 03/20/2009   DRUG DEPENDENCE 03/20/2009   UNSPECIFIED DRUG DEPENDENCE IN REMISSION 07/18/2008   Anxiety state 11/11/2007   Type 1 diabetes mellitus (HCC) 12/02/2006   Hyperlipidemia 12/02/2006   DEPRESSIVE DISORDER, RCR, MODERATE 12/02/2006    PCP: [name] , MD  REFERRING PROVIDER: [name], [name]  REFERRING DIAG: S/P ORIF right ankle  THERAPY DIAG:  Pain in right ankle and joints of right foot  Stiffness of right ankle, not elsewhere classified  Localized edema  Difficulty in walking, not elsewhere classified  Rationale for Evaluation and Treatment: Rehabilitation  ONSET DATE: 09/20/23  SUBJECTIVE:   SUBJECTIVE STATEMENT: Patient fell 09/20/23 and sustained a right tri malleolar fracture, underwent right ankle ORIF on 09/22/23.  She was NWB for 6 weeks, used a scooter, now FWB in the boot with a SBQC.  PERTINENT HISTORY: Anxiety, bipolar, cervical spondylosis, DM, depression, IBS, neuropathy, PD, POTS, spine surgery, shoulder surgery PAIN:  Are you having pain? Yes: NPRS scale: 0/10 Pain location: right ankle Pain description: ache Aggravating factors: walking, being on the foot 10/10 Relieving factors: rest, ice, aleve pain can be 0/10  PRECAUTIONS: None  RED FLAGS: None   WEIGHT BEARING RESTRICTIONS: No  FALLS:  Has patient fallen in last 6 months? Yes. Number of falls 1  LIVING  ENVIRONMENT: Lives with: lives with their family Lives in: House/apartment Stairs: No Has following equipment at home: Counselling psychologist and scooter  OCCUPATION: retired  PLOF: Independent and Leisure: recumbent bike, walking 3-4x/week 30 minutes  PATIENT GOALS: no pain, walk normally in a tennis shoe  NEXT MD VISIT: October 2?  OBJECTIVE:  Note: Objective measures were completed at Evaluation unless otherwise noted.  DIAGNOSTIC FINDINGS: ORIF right ankle  COGNITION: Overall cognitive status: Within functional limits for tasks assessed     SENSATION: WFL  EDEMA:  Circumferential: not tested but had edema over the medial and lateral malleolous  MUSCLE LENGTH: Tight calf  POSTURE: rounded shoulders and forward head  PALPATION: Tender medial right ankle, mm atrophy of the right calf, scars are healed with some tenderness, very tender achilles and calcaneus, to put the foot down heel down on bed hurt,  she has peeling skin and a few old blood blisters  LOWER EXTREMITY ROM:  Active ROM Right AROM Eval 11/19/23 Right PROM Eval 11/19/23  Hip flexion    Hip extension    Hip abduction    Hip adduction    Hip internal rotation    Hip external rotation    Knee flexion    Knee extension    Ankle dorsiflexion 0 4  Ankle plantarflexion 30 35  Ankle inversion 15 18  Ankle eversion 3 5   (Blank rows = not tested) Toes are very stiff has old scars from past surgeries  LOWER EXTREMITY MMT:  in the available ROM  MMT Right eval Left eval  Hip flexion    Hip extension    Hip abduction    Hip adduction    Hip internal rotation    Hip external rotation    Knee flexion    Knee extension    Ankle dorsiflexion 3+   Ankle plantarflexion 3+   Ankle inversion 3+   Ankle eversion 3+    (Blank rows = not tested)  FUNCTIONAL TESTS:  5 times sit to stand: 20 seconds fatigue at 3 Timed up and go (TUG): 20 seconds with SBQC  GAIT: Distance walked: 100 feet   Assistive device utilized: Quad cane small base Level of assistance: Modified independence Comments: slow, tends to carry the cane, in boot                                                                                                                                TREATMENT DATE:  11/19/23 Evaluation HEP    PATIENT EDUCATION:  Education details: POC/HEP Person educated: Patient Education method: Programmer, multimedia, Facilities manager, Actor cues, Verbal cues, and Handouts Education comprehension: verbalized understanding and returned demonstration  HOME EXERCISE PROGRAM: Access Code: [email or web address] URL: [email or web address] Date: 11/19/2023 Prepared by: Ozell Mainland  Exercises - Supine Ankle Pumps  - 2 x daily - 7 x weekly - 2 sets - 10 reps - 3 hold - Seated Ankle Circles  - 2 x daily - 7 x weekly - 2 sets - 10 reps - 3 hold - Long Sitting Calf Stretch with Strap  - 2 x daily - 7 x weekly - 2 sets - 10 reps - 30 hold - Seated Heel Raise  - 2 x daily - 7 x weekly - 2 sets - 10 reps - 3 hold - Towel Scrunches  - 2 x daily - 7 x weekly - 2 sets - 10 reps - 1 hold  ASSESSMENT:  CLINICAL IMPRESSION: Patient is a 63 y.o. female who was seen today for physical therapy evaluation and treatment for s/p right ORIF ankle.  She was NWB for 6 weeks, now in boot and WBAT walking with a SBQC and at times without.  The skin around the ankle and foot is peeling off, the skin is also very pink/red, it is not warm to touch,  there are two old blood blisters, she was very tender over the calcaneus and achilles.  Hsa limited ROM, we are to work on control and ROM, she is to be WBing in the boot until October 20 something when she sees the MD again  OBJECTIVE IMPAIRMENTS: Abnormal gait, cardiopulmonary status limiting activity, decreased activity tolerance, decreased balance, decreased coordination, decreased endurance, decreased mobility, difficulty walking, decreased ROM, decreased strength,  hypomobility, increased edema, increased muscle spasms, impaired flexibility, improper body mechanics, and pain.   ACTIVITY LIMITATIONS: carrying, lifting, bending, standing, squatting, stairs, transfers, toileting, dressing, and locomotion level  PARTICIPATION LIMITATIONS: meal prep, cleaning, laundry, driving, shopping, community activity, and occupation  PERSONAL FACTORS: Fitness, Past/current experiences, and 3+ comorbidities: as noted above are also affecting patient's functional outcome.   REHAB POTENTIAL: Good  CLINICAL DECISION MAKING: Evolving/moderate complexity  EVALUATION COMPLEXITY: Moderate   GOALS: Goals reviewed with patient? Yes  SHORT TERM GOALS: Target date: 12/12/23 Independent with initial HEP Baseline: Goal status: INITIAL  LONG TERM GOALS: Target date: 02/19/23  Independent with advanced HEP Baseline:  Goal status: INITIAL  2.  Walk without device and without boot all community distances with minimal deviation Baseline:  Goal status: INITIAL  3.  Increase AROM of DF to 12 degrees Baseline:  Goal status: INITIAL  4.  Increase strength of the right ankle to 4+/5 Baseline:  Goal status: INITIAL  5.  Decrease TUG time to 12 seconds Baseline:  Goal status: INITIAL  6.  Decrease 5XSTS to 13 seconds Baseline:  Goal status: INITIAL   PLAN:  PT FREQUENCY: 1-2x/week  PT DURATION: 12 weeks  PLANNED INTERVENTIONS: 97164- PT Re-evaluation, 97110-Therapeutic exercises, 97530- Therapeutic activity, 97112- Neuromuscular re-education, 97535- Self Care, 02859- Manual therapy, [phone]- Gait training, [phone]- Electrical stimulation (unattended), 97016- Vasopneumatic device, Patient/Family education, Balance training, Stair training, Taping, Joint mobilization, and Cryotherapy  PLAN FOR NEXT SESSION: will start out at 1x/week to work on ROM and functional gait with the boot, until she sees the MD and we will add days if needed once she gets the boot  off   Vandora Jaskulski W, PT 11/19/2023, 4:52 PM

## 2023-11-19 ENCOUNTER — Ambulatory Visit: Attending: Orthopedic Surgery | Admitting: Physical Therapy

## 2023-11-19 ENCOUNTER — Other Ambulatory Visit: Payer: Self-pay

## 2023-11-19 ENCOUNTER — Encounter: Payer: Self-pay | Admitting: Physical Therapy

## 2023-11-19 DIAGNOSIS — M25571 Pain in right ankle and joints of right foot: Secondary | ICD-10-CM | POA: Insufficient documentation

## 2023-11-19 DIAGNOSIS — M25671 Stiffness of right ankle, not elsewhere classified: Secondary | ICD-10-CM | POA: Insufficient documentation

## 2023-11-19 DIAGNOSIS — R262 Difficulty in walking, not elsewhere classified: Secondary | ICD-10-CM | POA: Insufficient documentation

## 2023-11-19 DIAGNOSIS — R600 Localized edema: Secondary | ICD-10-CM | POA: Diagnosis not present

## 2023-11-26 ENCOUNTER — Other Ambulatory Visit: Payer: Self-pay | Admitting: Internal Medicine

## 2023-11-26 ENCOUNTER — Ambulatory Visit: Admitting: Physical Therapy

## 2023-11-27 ENCOUNTER — Other Ambulatory Visit: Payer: Self-pay

## 2023-12-03 ENCOUNTER — Ambulatory Visit: Admitting: Physical Therapy

## 2023-12-03 DIAGNOSIS — M25571 Pain in right ankle and joints of right foot: Secondary | ICD-10-CM | POA: Diagnosis not present

## 2023-12-03 DIAGNOSIS — M25671 Stiffness of right ankle, not elsewhere classified: Secondary | ICD-10-CM | POA: Diagnosis not present

## 2023-12-03 DIAGNOSIS — R262 Difficulty in walking, not elsewhere classified: Secondary | ICD-10-CM | POA: Diagnosis not present

## 2023-12-03 DIAGNOSIS — R600 Localized edema: Secondary | ICD-10-CM | POA: Diagnosis not present

## 2023-12-03 NOTE — Therapy (Signed)
 OUTPATIENT PHYSICAL THERAPY LOWER EXTREMITY   Patient Name: Lynn Barnes MRN: [id number] DOB:[birth year], 63 y.o., female Today's Date: 12/03/2023  END OF SESSION:  PT End of Session - 12/03/23 1716     Visit Number 3    Number of Visits 13    Date for Recertification  02/10/24    Authorization Type [employer]          Past Medical History:  Diagnosis Date   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 [facility])    opium    Addiction, opium  (HCC)    Allergy    spring   Anxiety    ADHD CHRONIC ANXIETY   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 (HCC)    Bipolar disorder (HCC) 02/15/2011   Cervical spondylosis without myelopathy    Depression    Depression    Diabetes mellitus    sees Dr Altheimer/endo   Dysautonomia (HCC) 10/24/2011   Dyskinesia     subacute,due to drugs   Dyslipidemia    History of nuclear stress test    Myoview 11/16: EF 81%, no ischemia; Low Risk   Hyperlipidemia    IBS (irritable bowel syndrome) 02/15/2011   Insomnia 02/21/2011   Multinodular thyroid     with large dominant solid nodule with calcifications in the lower left pole benign by FNA in 1/12 and stable by repeat ulltrasound in 4/13   Neuropathy    with paresthesias in feet   Orthostatic hypotension    Orthostatic hypotension    Parkinsonism (HCC)    due to prolonged prozac/risperdl for yrs/Dr Dohmeir   Parkinsonism (HCC)    PONV (postoperative nausea and vomiting)    POTS (postural orthostatic tachycardia syndrome)    Radiculopathy    cervical   Resting tremor    Rotator cuff syndrome of left shoulder    S/P partial hysterectomy    Spondylosis without myelopathy    Subacute dyskinesia due to drugs(333.85)    dr dohmeier/neuro   Tachycardia    syndrome   Therapy    on therapy by psychiatrist using sebutex    Thyroid  nodule    s/p biopsy - benign approx jan 2012   Tremor    Past Surgical History:  Procedure Laterality Date   ACROMIOPLASTY Left 2002   Dr Heide   ARTHROSCOPY, ANKLE WITH DEBRIDEMENT Right  09/22/2023   Procedure: ARTHROSCOPY, ANKLE WITH DEBRIDEMENT;  Surgeon: Beverley Evalene BIRCH, MD;  Location: [facility];  Service: Orthopedics;  Laterality: Right;   BREAST EXCISIONAL BIOPSY Right    benign   BREAST LUMPECTOMY Right    herniated disc cervical spine     lower back  1992   OOPHORECTOMY     ORIF ANKLE FRACTURE Right 09/22/2023   Procedure: OPEN REDUCTION INTERNAL FIXATION (ORIF) ANKLE FRACTURE;  Surgeon: Beverley Evalene BIRCH, MD;  Location: [facility];  Service: Orthopedics;  Laterality: Right;   PARTIAL HYSTERECTOMY     SHOULDER SURGERY Left    SPINE SURGERY  03/2010   c5-6 fusion, Dr Burnetta   THYROID  LOBECTOMY  12/2009   nodule biopsy, benign   Patient Active Problem List   Diagnosis Date Noted   Acute URI 11/06/2022   Acquired deformity of lower leg 10/10/2022   HTN (hypertension) 11/09/2021   Hypoglycemia unawareness associated with type 1 diabetes mellitus (HCC) 05/24/2021   Blister of right heel 09/07/2020   Uses self-applied continuous glucose monitoring device 04/05/2020   Posterior neck pain 09/23/2018   Parkinsonism with orthostatic hypotension (HCC) 01/05/2018   Type  1 diabetes mellitus with hyperglycemia (HCC) 06/03/2016   Insulin pump status 03/03/2016   Mixed dyslipidemia 03/03/2016   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) 04/26/2015   Severe recurrent major depressive disorder with psychotic features (HCC) 04/26/2015   Parkinsonian syndrome associated with symptomatic orthostatic hypotension (HCC) 04/26/2015   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 02/21/2015   Allergic rhinitis 02/20/2015   Regurgitation of food 08/17/2014   Memory dysfunction 08/17/2014   Lumbar degenerative disc disease 12/20/2013   Greater trochanteric bursitis of right hip 03/23/2013   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 (HCC)    POTS (postural orthostatic tachycardia syndrome) 03/06/2012   Subacute dyskinesia due to drug 10/24/2011    Subacute dyskinesia due to drug 10/24/2011   ADD (attention deficit disorder) 02/23/2011   Insomnia 02/21/2011   Syncope 02/19/2011   Parkinsonism (HCC)    Thyroid  nodule    Encounter for well adult exam with abnormal findings 02/15/2011   IBS (irritable bowel syndrome) 02/15/2011   Bipolar disorder (HCC) 02/15/2011   Brachial neuritis or radiculitis 02/08/2010   Neuropathy 03/20/2009   DRUG DEPENDENCE 03/20/2009   UNSPECIFIED DRUG DEPENDENCE IN REMISSION 07/18/2008   Anxiety state 11/11/2007   Type 1 diabetes mellitus (HCC) 12/02/2006   Hyperlipidemia 12/02/2006   DEPRESSIVE DISORDER, RCR, MODERATE 12/02/2006    PCP: [name] , MD  REFERRING PROVIDER: [name], [name]  REFERRING DIAG: S/P ORIF right ankle  THERAPY DIAG:  Pain in right ankle and joints of right foot  Stiffness of right ankle, not elsewhere classified  Localized edema  Difficulty in walking, not elsewhere classified  Rationale for Evaluation and Treatment: Rehabilitation  ONSET DATE: 09/20/23  SUBJECTIVE:   SUBJECTIVE STATEMENT:  Pt arrives walking well in walking boot FWB. No pain with walking Patient fell 09/20/23 and sustained a right tri malleolar fracture, underwent right ankle ORIF on 09/22/23.  She was NWB for 6 weeks, used a scooter, now FWB in the boot with a SBQC.  PERTINENT HISTORY: Anxiety, bipolar, cervical spondylosis, DM, depression, IBS, neuropathy, PD, POTS, spine surgery, shoulder surgery PAIN:  Are you having pain? Yes: NPRS scale: 0/10 Pain location: right ankle Pain description: ache Aggravating factors: walking, being on the foot 10/10 Relieving factors: rest, ice, aleve pain can be 0/10  PRECAUTIONS: None  RED FLAGS: None   WEIGHT BEARING RESTRICTIONS: No  FALLS:  Has patient fallen in last 6 months? Yes. Number of falls 1  LIVING ENVIRONMENT: Lives with: lives with their family Lives in: House/apartment Stairs: No Has following equipment at home: Counselling psychologist  and scooter  OCCUPATION: retired  PLOF: Independent and Leisure: recumbent bike, walking 3-4x/week 30 minutes  PATIENT GOALS: no pain, walk normally in a tennis shoe  NEXT MD VISIT: October 2?  OBJECTIVE:  Note: Objective measures were completed at Evaluation unless otherwise noted.  DIAGNOSTIC FINDINGS: ORIF right ankle  COGNITION: Overall cognitive status: Within functional limits for tasks assessed     SENSATION: WFL  EDEMA:  Circumferential: not tested but had edema over the medial and lateral malleolous  MUSCLE LENGTH: Tight calf  POSTURE: rounded shoulders and forward head  PALPATION: Tender medial right ankle, mm atrophy of the right calf, scars are healed with some tenderness, very tender achilles and calcaneus, to put the foot down heel down on bed hurt, she has peeling skin and a few old blood blisters  LOWER EXTREMITY ROM:  Active ROM Right AROM Eval 11/19/23 Right PROM Eval 11/19/23 RT  12/03/23 AROM  Hip flexion     Hip extension     Hip abduction     Hip adduction     Hip internal rotation     Hip external rotation     Knee flexion     Knee extension     Ankle dorsiflexion 0 4 7  Ankle plantarflexion 30 35 60  Ankle inversion 15 18 25   Ankle eversion 3 5 15    (Blank rows = not tested) Toes are very stiff has old scars from past surgeries  LOWER EXTREMITY MMT:  in the available ROM  MMT Right eval Left eval  Hip flexion    Hip extension    Hip abduction    Hip adduction    Hip internal rotation    Hip external rotation    Knee flexion    Knee extension    Ankle dorsiflexion 3+   Ankle plantarflexion 3+   Ankle inversion 3+   Ankle eversion 3+    (Blank rows = not tested)  FUNCTIONAL TESTS:  5 times sit to stand: 20 seconds fatigue at 3 Timed up and go (TUG): 20 seconds with SBQC  GAIT: Distance walked: 100 feet  Assistive device utilized: Quad cane small base Level of assistance: Modified independence Comments: slow,  tends to carry the cane, in boot                                                                                                                                TREATMENT DATE:  12/03/23 Red tband DF,PF,INV,EV 2 sets 10 issued for HEP PROM RT ankle with measurements NUstep L 2 6 min Seated sit fit 4 ways 10 x each  Standing sit fit WBAT 4 way 10 x each Gentle standing calf stretch   11/19/23 Evaluation HEP    PATIENT EDUCATION:  Education details: POC/HEP Person educated: Patient Education method: Programmer, multimedia, Demonstration, Actor cues, Verbal cues, and Handouts Education comprehension: verbalized understanding and returned demonstration  HOME EXERCISE PROGRAM: Access Code: [email or web address] URL: [email or web address] Date: 12/03/2023 Prepared by: Joy Haegele  Exercises - Ankle Dorsiflexion with Resistance  - 2 x daily - 7 x weekly - 2 sets - 10 reps - Ankle and Toe Plantarflexion with Resistance  - 2 x daily - 7 x weekly - 2 sets - 10 reps - Ankle Inversion with Resistance  - 2 x daily - 7 x weekly - 2 sets - 10 reps - Ankle Eversion with Resistance  - 2 x daily - 7 x weekly - 2 sets - 10 reps Access Code: [email or web address] URL: [email or web address] Date: 11/19/2023 Prepared by: Ozell Mainland  Exercises - Supine Ankle Pumps  - 2 x daily - 7 x weekly - 2 sets - 10 reps - 3 hold - Seated Ankle Circles  - 2 x daily - 7 x weekly - 2 sets - 10 reps - 3 hold - Long Sitting Calf Stretch with Strap  - 2 x daily -  7 x weekly - 2 sets - 10 reps - 30 hold - Seated Heel Raise  - 2 x daily - 7 x weekly - 2 sets - 10 reps - 3 hold - Towel Scrunches  - 2 x daily - 7 x weekly - 2 sets - 10 reps - 1 hold  ASSESSMENT:  CLINICAL IMPRESSION: pt arrives walking without AD with boot with good tech. States she sees MD tomorrow and suppose to get out of boot into brace. Educ on weaning out of boot. Educ on swelling may increase and elevate above heart and she VU.  Progressed ex and issued tband for HEP. Good improvement in AROM    Patient is a 63 y.o. female who was seen today for physical therapy evaluation and treatment for s/p right ORIF ankle.  She was NWB for 6 weeks, now in boot and WBAT walking with a SBQC and at times without.  The skin around the ankle and foot is peeling off, the skin is also very pink/red, it is not warm to touch, there are two old blood blisters, she was very tender over the calcaneus and achilles.  Hsa limited ROM, we are to work on control and ROM, she is to be WBing in the boot until October 20 something when she sees the MD again  OBJECTIVE IMPAIRMENTS: Abnormal gait, cardiopulmonary status limiting activity, decreased activity tolerance, decreased balance, decreased coordination, decreased endurance, decreased mobility, difficulty walking, decreased ROM, decreased strength, hypomobility, increased edema, increased muscle spasms, impaired flexibility, improper body mechanics, and pain.   ACTIVITY LIMITATIONS: carrying, lifting, bending, standing, squatting, stairs, transfers, toileting, dressing, and locomotion level  PARTICIPATION LIMITATIONS: meal prep, cleaning, laundry, driving, shopping, community activity, and occupation  PERSONAL FACTORS: Fitness, Past/current experiences, and 3+ comorbidities: as noted above are also affecting patient's functional outcome.   REHAB POTENTIAL: Good  CLINICAL DECISION MAKING: Evolving/moderate complexity  EVALUATION COMPLEXITY: Moderate   GOALS: Goals reviewed with patient? Yes  SHORT TERM GOALS: Target date: 12/12/23 Independent with initial HEP Baseline: Goal status: 12/03/23 MET  LONG TERM GOALS: Target date: 02/19/23  Independent with advanced HEP Baseline:  Goal status: INITIAL  2.  Walk without device and without boot all community distances with minimal deviation Baseline:  Goal status: INITIAL  3.  Increase AROM of DF to 12 degrees Baseline:  Goal status:  INITIAL  4.  Increase strength of the right ankle to 4+/5 Baseline:  Goal status: INITIAL  5.  Decrease TUG time to 12 seconds Baseline:  Goal status: INITIAL  6.  Decrease 5XSTS to 13 seconds Baseline:  Goal status: INITIAL   PLAN:  PT FREQUENCY: 1-2x/week  PT DURATION: 12 weeks  PLANNED INTERVENTIONS: 97164- PT Re-evaluation, 97110-Therapeutic exercises, 97530- Therapeutic activity, 97112- Neuromuscular re-education, 97535- Self Care, 02859- Manual therapy, [phone]- Gait training, [phone]- Electrical stimulation (unattended), 97016- Vasopneumatic device, Patient/Family education, Balance training, Stair training, Taping, Joint mobilization, and Cryotherapy  PLAN FOR NEXT SESSION: assess MD appt and progress  Reed Dady,ANGIE, PTA 12/03/2023, 5:18 PM

## 2023-12-03 NOTE — Therapy (Signed)
 OUTPATIENT PHYSICAL THERAPY LOWER EXTREMITY EVALUATION   Patient Name: Lynn Barnes MRN: [id number] DOB:Apr 19, 1960, 63 y.o., female Today's Date: 12/03/2023  END OF SESSION:  PT End of Session - 12/03/23 1656     Visit Number 2    Number of Visits 13    Date for Recertification  02/10/24    Authorization Type [employer]    PT Start Time 1700    PT Stop Time 1745    PT Time Calculation (min) 45 min          Past Medical History:  Diagnosis Date   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    Addiction, opium  (HCC)    Allergy    spring   Anxiety    ADHD CHRONIC ANXIETY   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 (HCC)    Bipolar disorder (HCC) 02/15/2011   Cervical spondylosis without myelopathy    Depression    Depression    Diabetes mellitus    sees Dr Altheimer/endo   Dysautonomia (HCC) 10/24/2011   Dyskinesia     subacute,due to drugs   Dyslipidemia    History of nuclear stress test    Myoview 11/16: EF 81%, no ischemia; Low Risk   Hyperlipidemia    IBS (irritable bowel syndrome) 02/15/2011   Insomnia 02/21/2011   Multinodular thyroid     with large dominant solid nodule with calcifications in the lower left pole benign by FNA in 1/12 and stable by repeat ulltrasound in 4/13   Neuropathy    with paresthesias in feet   Orthostatic hypotension    Orthostatic hypotension    Parkinsonism (HCC)    due to prolonged prozac/risperdl for yrs/Dr Dohmeir   Parkinsonism (HCC)    PONV (postoperative nausea and vomiting)    POTS (postural orthostatic tachycardia syndrome)    Radiculopathy    cervical   Resting tremor    Rotator cuff syndrome of left shoulder    S/P partial hysterectomy    Spondylosis without myelopathy    Subacute dyskinesia due to drugs(333.85)    dr dohmeier/neuro   Tachycardia    syndrome   Therapy    on therapy by psychiatrist using sebutex    Thyroid  nodule    s/p biopsy - benign approx jan 2012   Tremor    Past Surgical History:  Procedure  Laterality Date   ACROMIOPLASTY Left 2002   Dr Heide   ARTHROSCOPY, ANKLE WITH DEBRIDEMENT Right 09/22/2023   Procedure: ARTHROSCOPY, ANKLE WITH DEBRIDEMENT;  Surgeon: Beverley Evalene BIRCH, MD;  Location: [facility];  Service: Orthopedics;  Laterality: Right;   BREAST EXCISIONAL BIOPSY Right    benign   BREAST LUMPECTOMY Right    herniated disc cervical spine     lower back  1992   OOPHORECTOMY     ORIF ANKLE FRACTURE Right 09/22/2023   Procedure: OPEN REDUCTION INTERNAL FIXATION (ORIF) ANKLE FRACTURE;  Surgeon: Beverley Evalene BIRCH, MD;  Location: [facility];  Service: Orthopedics;  Laterality: Right;   PARTIAL HYSTERECTOMY     SHOULDER SURGERY Left    SPINE SURGERY  03/2010   c5-6 fusion, Dr Burnetta   THYROID  LOBECTOMY  12/2009   nodule biopsy, benign   Patient Active Problem List   Diagnosis Date Noted   Acute URI 11/06/2022   Acquired deformity of lower leg 10/10/2022   HTN (hypertension) 11/09/2021   Hypoglycemia unawareness associated with type 1 diabetes mellitus (HCC) 05/24/2021   Blister of right heel 09/07/2020  Uses self-applied continuous glucose monitoring device 04/05/2020   Posterior neck pain 09/23/2018   Parkinsonism with orthostatic hypotension (HCC) 01/05/2018   Type 1 diabetes mellitus with hyperglycemia (HCC) 06/03/2016   Insulin pump status 03/03/2016   Mixed dyslipidemia 03/03/2016   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) 04/26/2015   Severe recurrent major depressive disorder with psychotic features (HCC) 04/26/2015   Parkinsonian syndrome associated with symptomatic orthostatic hypotension (HCC) 04/26/2015   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 02/21/2015   Allergic rhinitis 02/20/2015   Regurgitation of food 08/17/2014   Memory dysfunction 08/17/2014   Lumbar degenerative disc disease 12/20/2013   Greater trochanteric bursitis of right hip 03/23/2013   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 (HCC)    POTS  (postural orthostatic tachycardia syndrome) 03/06/2012   Subacute dyskinesia due to drug 10/24/2011   Subacute dyskinesia due to drug 10/24/2011   ADD (attention deficit disorder) 02/23/2011   Insomnia 02/21/2011   Syncope 02/19/2011   Parkinsonism (HCC)    Thyroid  nodule    Encounter for well adult exam with abnormal findings 02/15/2011   IBS (irritable bowel syndrome) 02/15/2011   Bipolar disorder (HCC) 02/15/2011   Brachial neuritis or radiculitis 02/08/2010   Neuropathy 03/20/2009   DRUG DEPENDENCE 03/20/2009   UNSPECIFIED DRUG DEPENDENCE IN REMISSION 07/18/2008   Anxiety state 11/11/2007   Type 1 diabetes mellitus (HCC) 12/02/2006   Hyperlipidemia 12/02/2006   DEPRESSIVE DISORDER, RCR, MODERATE 12/02/2006    PCP: [name] , MD  REFERRING PROVIDER: [name], [name]  REFERRING DIAG: S/P ORIF right ankle  THERAPY DIAG:  Pain in right ankle and joints of right foot  Stiffness of right ankle, not elsewhere classified  Localized edema  Difficulty in walking, not elsewhere classified  Rationale for Evaluation and Treatment: Rehabilitation  ONSET DATE: 09/20/23  SUBJECTIVE:   SUBJECTIVE STATEMENT:   Patient fell 09/20/23 and sustained a right tri malleolar fracture, underwent right ankle ORIF on 09/22/23.  She was NWB for 6 weeks, used a scooter, now FWB in the boot with a SBQC.  PERTINENT HISTORY: Anxiety, bipolar, cervical spondylosis, DM, depression, IBS, neuropathy, PD, POTS, spine surgery, shoulder surgery PAIN:  Are you having pain? Yes: NPRS scale: 0/10 Pain location: right ankle Pain description: ache Aggravating factors: walking, being on the foot 10/10 Relieving factors: rest, ice, aleve pain can be 0/10  PRECAUTIONS: None  RED FLAGS: None   WEIGHT BEARING RESTRICTIONS: No  FALLS:  Has patient fallen in last 6 months? Yes. Number of falls 1  LIVING ENVIRONMENT: Lives with: lives with their family Lives in: House/apartment Stairs: No Has following  equipment at home: Counselling psychologist and scooter  OCCUPATION: retired  PLOF: Independent and Leisure: recumbent bike, walking 3-4x/week 30 minutes  PATIENT GOALS: no pain, walk normally in a tennis shoe  NEXT MD VISIT: October 2?  OBJECTIVE:  Note: Objective measures were completed at Evaluation unless otherwise noted.  DIAGNOSTIC FINDINGS: ORIF right ankle  COGNITION: Overall cognitive status: Within functional limits for tasks assessed     SENSATION: WFL  EDEMA:  Circumferential: not tested but had edema over the medial and lateral malleolous  MUSCLE LENGTH: Tight calf  POSTURE: rounded shoulders and forward head  PALPATION: Tender medial right ankle, mm atrophy of the right calf, scars are healed with some tenderness, very tender achilles and calcaneus, to put the foot down heel down on bed hurt, she has peeling skin and a few old blood blisters  LOWER EXTREMITY ROM:  Active ROM  Right AROM Eval 11/19/23 Right PROM Eval 11/19/23 Rt ACT 12/03/23  Hip flexion     Hip extension     Hip abduction     Hip adduction     Hip internal rotation     Hip external rotation     Knee flexion     Knee extension     Ankle dorsiflexion 0 4 7  Ankle plantarflexion 30 35 60  Ankle inversion 15 18 25   Ankle eversion 3 5 15    (Blank rows = not tested) Toes are very stiff has old scars from past surgeries  LOWER EXTREMITY MMT:  in the available ROM  MMT Right eval Left eval  Hip flexion    Hip extension    Hip abduction    Hip adduction    Hip internal rotation    Hip external rotation    Knee flexion    Knee extension    Ankle dorsiflexion 3+   Ankle plantarflexion 3+   Ankle inversion 3+   Ankle eversion 3+    (Blank rows = not tested)  FUNCTIONAL TESTS:  5 times sit to stand: 20 seconds fatigue at 3 Timed up and go (TUG): 20 seconds with SBQC  GAIT: Distance walked: 100 feet  Assistive device utilized: Quad cane small base Level of assistance:  Modified independence Comments: slow, tends to carry the cane, in boot                                                                                                                                TREATMENT DATE:  11/19/23 Evaluation HEP    PATIENT EDUCATION:  Education details: POC/HEP Person educated: Patient Education method: Programmer, multimedia, Facilities manager, Actor cues, Verbal cues, and Handouts Education comprehension: verbalized understanding and returned demonstration  HOME EXERCISE PROGRAM: Access Code: [email or web address] URL: [email or web address] Date: 11/19/2023 Prepared by: Ozell Mainland  Exercises - Supine Ankle Pumps  - 2 x daily - 7 x weekly - 2 sets - 10 reps - 3 hold - Seated Ankle Circles  - 2 x daily - 7 x weekly - 2 sets - 10 reps - 3 hold - Long Sitting Calf Stretch with Strap  - 2 x daily - 7 x weekly - 2 sets - 10 reps - 30 hold - Seated Heel Raise  - 2 x daily - 7 x weekly - 2 sets - 10 reps - 3 hold - Towel Scrunches  - 2 x daily - 7 x weekly - 2 sets - 10 reps - 1 hold  ASSESSMENT:  CLINICAL IMPRESSION: Patient is a 63 y.o. female who was seen today for physical therapy evaluation and treatment for s/p right ORIF ankle.  She was NWB for 6 weeks, now in boot and WBAT walking with a SBQC and at times without.  The skin around the ankle and foot is peeling off, the skin is also very pink/red, it is not warm to touch, there are  two old blood blisters, she was very tender over the calcaneus and achilles.  Hsa limited ROM, we are to work on control and ROM, she is to be WBing in the boot until October 20 something when she sees the MD again  OBJECTIVE IMPAIRMENTS: Abnormal gait, cardiopulmonary status limiting activity, decreased activity tolerance, decreased balance, decreased coordination, decreased endurance, decreased mobility, difficulty walking, decreased ROM, decreased strength, hypomobility, increased edema, increased muscle spasms, impaired  flexibility, improper body mechanics, and pain.   ACTIVITY LIMITATIONS: carrying, lifting, bending, standing, squatting, stairs, transfers, toileting, dressing, and locomotion level  PARTICIPATION LIMITATIONS: meal prep, cleaning, laundry, driving, shopping, community activity, and occupation  PERSONAL FACTORS: Fitness, Past/current experiences, and 3+ comorbidities: as noted above are also affecting patient's functional outcome.   REHAB POTENTIAL: Good  CLINICAL DECISION MAKING: Evolving/moderate complexity  EVALUATION COMPLEXITY: Moderate   GOALS: Goals reviewed with patient? Yes  SHORT TERM GOALS: Target date: 12/12/23 Independent with initial HEP Baseline: Goal status: INITIAL  LONG TERM GOALS: Target date: 02/19/23  Independent with advanced HEP Baseline:  Goal status: INITIAL  2.  Walk without device and without boot all community distances with minimal deviation Baseline:  Goal status: INITIAL  3.  Increase AROM of DF to 12 degrees Baseline:  Goal status: INITIAL  4.  Increase strength of the right ankle to 4+/5 Baseline:  Goal status: INITIAL  5.  Decrease TUG time to 12 seconds Baseline:  Goal status: INITIAL  6.  Decrease 5XSTS to 13 seconds Baseline:  Goal status: INITIAL   PLAN:  PT FREQUENCY: 1-2x/week  PT DURATION: 12 weeks  PLANNED INTERVENTIONS: 97164- PT Re-evaluation, 97110-Therapeutic exercises, 97530- Therapeutic activity, 97112- Neuromuscular re-education, 97535- Self Care, 02859- Manual therapy, [phone]- Gait training, [phone]- Electrical stimulation (unattended), 97016- Vasopneumatic device, Patient/Family education, Balance training, Stair training, Taping, Joint mobilization, and Cryotherapy  PLAN FOR NEXT SESSION: will start out at 1x/week to work on ROM and functional gait with the boot, until she sees the MD and we will add days if needed once she gets the boot off   Jalayna Josten W, PT 12/03/2023, 4:57 PM

## 2023-12-04 DIAGNOSIS — M25571 Pain in right ankle and joints of right foot: Secondary | ICD-10-CM | POA: Diagnosis not present

## 2023-12-04 DIAGNOSIS — L539 Erythematous condition, unspecified: Secondary | ICD-10-CM | POA: Diagnosis not present

## 2023-12-04 DIAGNOSIS — L03115 Cellulitis of right lower limb: Secondary | ICD-10-CM | POA: Insufficient documentation

## 2023-12-04 DIAGNOSIS — S82851D Displaced trimalleolar fracture of right lower leg, subsequent encounter for closed fracture with routine healing: Secondary | ICD-10-CM | POA: Diagnosis not present

## 2023-12-05 DIAGNOSIS — S82853A Displaced trimalleolar fracture of unspecified lower leg, initial encounter for closed fracture: Secondary | ICD-10-CM | POA: Insufficient documentation

## 2023-12-05 DIAGNOSIS — L539 Erythematous condition, unspecified: Secondary | ICD-10-CM | POA: Insufficient documentation

## 2023-12-08 DIAGNOSIS — F5105 Insomnia due to other mental disorder: Secondary | ICD-10-CM | POA: Diagnosis not present

## 2023-12-08 DIAGNOSIS — F3189 Other bipolar disorder: Secondary | ICD-10-CM | POA: Diagnosis not present

## 2023-12-10 ENCOUNTER — Ambulatory Visit: Admitting: Physical Therapy

## 2023-12-10 DIAGNOSIS — M25671 Stiffness of right ankle, not elsewhere classified: Secondary | ICD-10-CM | POA: Diagnosis not present

## 2023-12-10 DIAGNOSIS — M25571 Pain in right ankle and joints of right foot: Secondary | ICD-10-CM

## 2023-12-10 DIAGNOSIS — R262 Difficulty in walking, not elsewhere classified: Secondary | ICD-10-CM | POA: Diagnosis not present

## 2023-12-10 DIAGNOSIS — R600 Localized edema: Secondary | ICD-10-CM | POA: Diagnosis not present

## 2023-12-10 NOTE — Therapy (Signed)
 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    Addiction, opium  (HCC)    Allergy    spring   Anxiety    ADHD CHRONIC ANXIETY   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 (HCC)    Bipolar disorder (HCC) 02/15/2011   Cervical spondylosis without myelopathy    Depression    Depression    Diabetes mellitus    sees Dr Altheimer/endo   Dysautonomia (HCC) 10/24/2011   Dyskinesia     subacute,due to drugs   Dyslipidemia    History of nuclear stress test    Myoview 11/16: EF 81%, no ischemia; Low Risk   Hyperlipidemia    IBS (irritable bowel syndrome) 02/15/2011   Insomnia 02/21/2011   Multinodular thyroid     with large dominant solid nodule with calcifications in the lower left pole benign by FNA in 1/12 and stable by repeat ulltrasound in 4/13   Neuropathy    with paresthesias in feet   Orthostatic hypotension    Orthostatic hypotension    Parkinsonism (HCC)    due to prolonged prozac/risperdl for yrs/Dr Dohmeir   Parkinsonism (HCC)    PONV (postoperative nausea and vomiting)    POTS (postural orthostatic tachycardia syndrome)    Radiculopathy    cervical   Resting tremor    Rotator cuff syndrome of left shoulder    S/P partial hysterectomy    Spondylosis without myelopathy    Subacute dyskinesia due to drugs(333.85)    dr dohmeier/neuro   Tachycardia    syndrome   Therapy    on therapy by psychiatrist using sebutex    Thyroid  nodule    s/p biopsy - benign approx jan 2012   Tremor    Past Surgical History:  Procedure Laterality Date    ACROMIOPLASTY Left 2002   Dr Heide   ARTHROSCOPY, ANKLE WITH DEBRIDEMENT Right 09/22/2023   Procedure: ARTHROSCOPY, ANKLE WITH DEBRIDEMENT;  Surgeon: Beverley Evalene BIRCH, MD;  Location: [facility];  Service: Orthopedics;  Laterality: Right;   BREAST EXCISIONAL BIOPSY Right    benign   BREAST LUMPECTOMY Right    herniated disc cervical spine     lower back  1992   OOPHORECTOMY     ORIF ANKLE FRACTURE Right 09/22/2023   Procedure: OPEN REDUCTION INTERNAL FIXATION (ORIF) ANKLE FRACTURE;  Surgeon: Beverley Evalene BIRCH, MD;  Location: [facility];  Service: Orthopedics;  Laterality: Right;   PARTIAL HYSTERECTOMY     SHOULDER SURGERY Left    SPINE SURGERY  03/2010   c5-6 fusion, Dr Burnetta   THYROID  LOBECTOMY  12/2009   nodule biopsy, benign   Patient Active Problem List   Diagnosis Date Noted   Acute URI 11/06/2022   Acquired deformity of lower leg 10/10/2022   HTN (hypertension) 11/09/2021   Hypoglycemia unawareness associated with type 1 diabetes mellitus (HCC) 05/24/2021   Blister of right heel 09/07/2020   Uses  self-applied continuous glucose monitoring device 04/05/2020   Posterior neck pain 09/23/2018   Parkinsonism with orthostatic hypotension (HCC) 01/05/2018   Type 1 diabetes mellitus with hyperglycemia (HCC) 06/03/2016   Insulin pump status 03/03/2016   Mixed dyslipidemia 03/03/2016   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) 04/26/2015   Severe recurrent major depressive disorder with psychotic features (HCC) 04/26/2015   Parkinsonian syndrome associated with symptomatic orthostatic hypotension (HCC) 04/26/2015   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 02/21/2015   Allergic rhinitis 02/20/2015   Regurgitation of food 08/17/2014   Memory dysfunction 08/17/2014   Lumbar degenerative disc disease 12/20/2013   Greater trochanteric bursitis of right hip 03/23/2013   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 (HCC)    POTS (postural orthostatic  tachycardia syndrome) 03/06/2012   Subacute dyskinesia due to drug 10/24/2011   Subacute dyskinesia due to drug 10/24/2011   ADD (attention deficit disorder) 02/23/2011   Insomnia 02/21/2011   Syncope 02/19/2011   Parkinsonism (HCC)    Thyroid  nodule    Encounter for well adult exam with abnormal findings 02/15/2011   IBS (irritable bowel syndrome) 02/15/2011   Bipolar disorder (HCC) 02/15/2011   Brachial neuritis or radiculitis 02/08/2010   Neuropathy 03/20/2009   DRUG DEPENDENCE 03/20/2009   UNSPECIFIED DRUG DEPENDENCE IN REMISSION 07/18/2008   Anxiety state 11/11/2007   Type 1 diabetes mellitus (HCC) 12/02/2006   Hyperlipidemia 12/02/2006   DEPRESSIVE DISORDER, RCR, MODERATE 12/02/2006    PCP: [name] , MD  REFERRING PROVIDER: [name], [name]  REFERRING DIAG: S/P ORIF right ankle  THERAPY DIAG:  Pain in right ankle and joints of right foot  Stiffness of right ankle, not elsewhere classified  Localized edema  Rationale for Evaluation and Treatment: Rehabilitation  ONSET DATE: 09/20/23  SUBJECTIVE:   SUBJECTIVE STATEMENT:  Pt arrives walking slightly antalgic with ASO. MD pleased. Told her no limitations but wear boot in therapy with all activities. Patient fell 09/20/23 and sustained a right tri malleolar fracture, underwent right ankle ORIF on 09/22/23.  She was NWB for 6 weeks, used a scooter, now FWB in the boot with a SBQC.  PERTINENT HISTORY: Anxiety, bipolar, cervical spondylosis, DM, depression, IBS, neuropathy, PD, POTS, spine surgery, shoulder surgery PAIN:  Are you having pain? Yes: NPRS scale: 0/10 Pain location: right ankle Pain description: ache Aggravating factors: walking, being on the foot 10/10 Relieving factors: rest, ice, aleve pain can be 0/10  PRECAUTIONS: None  RED FLAGS: None   WEIGHT BEARING RESTRICTIONS: No  FALLS:  Has patient fallen in last 6 months? Yes. Number of falls 1  LIVING ENVIRONMENT: Lives with: lives with their  family Lives in: House/apartment Stairs: No Has following equipment at home: Counselling psychologist and scooter  OCCUPATION: retired  PLOF: Independent and Leisure: recumbent bike, walking 3-4x/week 30 minutes  PATIENT GOALS: no pain, walk normally in a tennis shoe  NEXT MD VISIT: October 2?  OBJECTIVE:  Note: Objective measures were completed at Evaluation unless otherwise noted.  DIAGNOSTIC FINDINGS: ORIF right ankle  COGNITION: Overall cognitive status: Within functional limits for tasks assessed     SENSATION: WFL  EDEMA:  Circumferential: not tested but had edema over the medial and lateral malleolous  MUSCLE LENGTH: Tight calf  POSTURE: rounded shoulders and forward head  PALPATION: Tender medial right ankle, mm atrophy of the right calf, scars are healed with some tenderness, very tender achilles and calcaneus, to put the foot down heel down on bed hurt, she has peeling skin and  a few old blood blisters  LOWER EXTREMITY ROM:  Active ROM Right AROM Eval 11/19/23 Right PROM Eval 11/19/23 RT  12/03/23 AROM RT 12/10/23 AROM  Hip flexion      Hip extension      Hip abduction      Hip adduction      Hip internal rotation      Hip external rotation      Knee flexion      Knee extension      Ankle dorsiflexion 0 4 7 12   Ankle plantarflexion 30 35 60 60  Ankle inversion 15 18 25  32  Ankle eversion 3 5 15 22    (Blank rows = not tested) Toes are very stiff has old scars from past surgeries  LOWER EXTREMITY MMT:  in the available ROM  MMT Right eval Left eval  Hip flexion    Hip extension    Hip abduction    Hip adduction    Hip internal rotation    Hip external rotation    Knee flexion    Knee extension    Ankle dorsiflexion 3+   Ankle plantarflexion 3+   Ankle inversion 3+   Ankle eversion 3+    (Blank rows = not tested)  FUNCTIONAL TESTS:  5 times sit to stand: 20 seconds fatigue at 3 Timed up and go (TUG): 20 seconds with  SBQC  GAIT: Distance walked: 100 feet  Assistive device utilized: Quad cane small base Level of assistance: Modified independence Comments: slow, tends to carry the cane, in boot                                                                                                                                TREATMENT DATE:  12/10/23 Nustep L 5 6 min LE only Resisted gait fwd and back 5 x each CGA 40# Resisted gait laterally 3 x each min A 40# PROM left ankle and foot with ROM taken Leg press 40# BIL 10 x, then 20# RT LE only 10 x Black bar heel raise and toe raises  10x 6 inch step up with UE 10 x fwd and 10 x laterally 4 inch step down 10 x Standing WBAT ankle 4 ways 10 x each  12/03/23 Red tband DF,PF,INV,EV 2 sets 10 issued for HEP PROM RT ankle with measurements NUstep L 2 6 min Seated sit fit 4 ways 10 x each  Standing sit fit WBAT 4 way 10 x each Gentle standing calf stretch   11/19/23 Evaluation HEP    PATIENT EDUCATION:  Education details: POC/HEP Person educated: Patient Education method: Programmer, Multimedia, Demonstration, Actor cues, Verbal cues, and Handouts Education comprehension: verbalized understanding and returned demonstration  HOME EXERCISE PROGRAM: Access Code: [email or web address] URL: [email or web address] Date: 12/03/2023 Prepared by: Sherle Mello  Exercises - Ankle Dorsiflexion with Resistance  - 2 x daily - 7 x weekly - 2 sets - 10 reps - Ankle and Toe Plantarflexion with  Resistance  - 2 x daily - 7 x weekly - 2 sets - 10 reps - Ankle Inversion with Resistance  - 2 x daily - 7 x weekly - 2 sets - 10 reps - Ankle Eversion with Resistance  - 2 x daily - 7 x weekly - 2 sets - 10 reps Access Code: [email or web address] URL: [email or web address] Date: 11/19/2023 Prepared by: Ozell Mainland  Exercises - Supine Ankle Pumps  - 2 x daily - 7 x weekly - 2 sets - 10 reps - 3 hold - Seated Ankle Circles  - 2 x daily - 7 x weekly - 2 sets - 10  reps - 3 hold - Long Sitting Calf Stretch with Strap  - 2 x daily - 7 x weekly - 2 sets - 10 reps - 30 hold - Seated Heel Raise  - 2 x daily - 7 x weekly - 2 sets - 10 reps - 3 hold - Towel Scrunches  - 2 x daily - 7 x weekly - 2 sets - 10 reps - 1 hold  ASSESSMENT:  CLINICAL IMPRESSION: Pt arrives walking slightly antalgic with ASO. MD pleased. Told her no limitations but wear boot in therapy with all activities.. Progressed ex and she tolerated well but notable weakness and issues with sugar dropping. ROM and goals assessed and documented  Patient is a 62 y.o. female who was seen today for physical therapy evaluation and treatment for s/p right ORIF ankle.  She was NWB for 6 weeks, now in boot and WBAT walking with a SBQC and at times without.  The skin around the ankle and foot is peeling off, the skin is also very pink/red, it is not warm to touch, there are two old blood blisters, she was very tender over the calcaneus and achilles.  Hsa limited ROM, we are to work on control and ROM, she is to be WBing in the boot until October 20 something when she sees the MD again  OBJECTIVE IMPAIRMENTS: Abnormal gait, cardiopulmonary status limiting activity, decreased activity tolerance, decreased balance, decreased coordination, decreased endurance, decreased mobility, difficulty walking, decreased ROM, decreased strength, hypomobility, increased edema, increased muscle spasms, impaired flexibility, improper body mechanics, and pain.   ACTIVITY LIMITATIONS: carrying, lifting, bending, standing, squatting, stairs, transfers, toileting, dressing, and locomotion level  PARTICIPATION LIMITATIONS: meal prep, cleaning, laundry, driving, shopping, community activity, and occupation  PERSONAL FACTORS: Fitness, Past/current experiences, and 3+ comorbidities: as noted above are also affecting patient's functional outcome.   REHAB POTENTIAL: Good  CLINICAL DECISION MAKING: Evolving/moderate  complexity  EVALUATION COMPLEXITY: Moderate   GOALS: Goals reviewed with patient? Yes  SHORT TERM GOALS: Target date: 12/12/23 Independent with initial HEP Baseline: Goal status: 12/03/23 MET  LONG TERM GOALS: Target date: 02/19/23  Independent with advanced HEP Baseline:  Goal status: INITIAL  2.  Walk without device and without boot all community distances with minimal deviation Baseline:  Goal status: progressing 12/10/23  3.  Increase AROM of DF to 12 degrees Baseline:  Goal status: INITIAL  4.  Increase strength of the right ankle to 4+/5 Baseline:  Goal status: progressing 12/10/23  5.  Decrease TUG time to 12 seconds Baseline:  Goal status: MET  12/10/23 7.97 sec  6.  Decrease 5XSTS to 13 seconds Baseline:  Goal status: MET 12/10/23 8.5 sec   PLAN:  PT FREQUENCY: 1-2x/week  PT DURATION: 12 weeks  PLANNED INTERVENTIONS: 97164- PT Re-evaluation, 97110-Therapeutic exercises, 97530- Therapeutic activity, 97112- Neuromuscular re-education, 97535- Self Care, 02859- Manual therapy,  02883- Gait training, [phone]- Electrical stimulation (unattended), [phone]- Vasopneumatic device, Patient/Family education, Balance training, Stair training, Taping, Joint mobilization, and Cryotherapy  PLAN FOR NEXT SESSION: assess and progress  Vayda Dungee,ANGIE, PTA 12/10/2023, 4:51 PM

## 2023-12-16 ENCOUNTER — Other Ambulatory Visit: Payer: Self-pay | Admitting: *Deleted

## 2023-12-16 ENCOUNTER — Telehealth: Payer: Self-pay | Admitting: Adult Health

## 2023-12-16 ENCOUNTER — Ambulatory Visit: Attending: Orthopedic Surgery | Admitting: Physical Therapy

## 2023-12-16 MED ORDER — CARBIDOPA-LEVODOPA ER 50-200 MG PO TBCR: 1.0000 | EXTENDED_RELEASE_TABLET | Freq: Two times a day (BID) | ORAL | 1 refills | Status: AC

## 2023-12-16 NOTE — Telephone Encounter (Signed)
 Resent refill for 6 month.  Has appt in 08/2024.

## 2023-12-16 NOTE — Telephone Encounter (Signed)
 Pt called to  request medication refill carbidopa -levodopa  (SINEMET  CR) 50-200 MG tablet  Pt is out of medication and stated Pharmacy will call as well for refill  Pt medication is to be sent to [facility] [address] - [place],  - [address] AT [address] (Ph: [phone])

## 2023-12-18 DIAGNOSIS — M25471 Effusion, right ankle: Secondary | ICD-10-CM | POA: Diagnosis not present

## 2023-12-18 DIAGNOSIS — M25671 Stiffness of right ankle, not elsewhere classified: Secondary | ICD-10-CM | POA: Diagnosis not present

## 2023-12-18 DIAGNOSIS — Z472 Encounter for removal of internal fixation device: Secondary | ICD-10-CM | POA: Diagnosis not present

## 2023-12-18 DIAGNOSIS — Z4789 Encounter for other orthopedic aftercare: Secondary | ICD-10-CM | POA: Diagnosis not present

## 2023-12-31 ENCOUNTER — Ambulatory Visit: Admitting: Physical Therapy

## 2024-01-06 ENCOUNTER — Ambulatory Visit

## 2024-01-08 ENCOUNTER — Other Ambulatory Visit: Payer: Self-pay | Admitting: Nurse Practitioner

## 2024-01-11 ENCOUNTER — Telehealth: Payer: Self-pay | Admitting: Internal Medicine

## 2024-01-11 MED ORDER — LISINOPRIL 20 MG PO TABS: ORAL_TABLET | ORAL | 1 refills | Status: DC

## 2024-01-11 NOTE — Telephone Encounter (Signed)
*  STAT* If patient is at the pharmacy, call can be transferred to refill team.   1. Which medications need to be refilled? (please list name of each medication and dose if known)   lisinopril  (ZESTRIL ) 20 MG tablet     2. Would you like to learn more about the convenience, safety, & potential cost savings by using the [facility]? No      3. Are you open to using the [facility] (Type [facility]. ). No    4. Which pharmacy/location (including street and city if local pharmacy) is medication to be sent to? [facility] [address] [place], [place] - [address] AT [facility] [address]     5. Do they need a 30 day or 90 day supply? 90 days    Patient will be going out of town tomorrow, need refill today

## 2024-01-11 NOTE — Telephone Encounter (Signed)
 Sent in refill

## 2024-02-04 DIAGNOSIS — M19171 Post-traumatic osteoarthritis, right ankle and foot: Secondary | ICD-10-CM | POA: Insufficient documentation

## 2024-02-04 DIAGNOSIS — M7661 Achilles tendinitis, right leg: Secondary | ICD-10-CM | POA: Insufficient documentation

## 2024-02-15 ENCOUNTER — Ambulatory Visit: Payer: Medicare (Managed Care) | Admitting: Internal Medicine

## 2024-02-15 ENCOUNTER — Encounter: Payer: Self-pay | Admitting: Internal Medicine

## 2024-02-15 VITALS — BP 122/84 | HR 80 | Temp 98.1°F | Ht 67.0 in | Wt 170.0 lb

## 2024-02-15 DIAGNOSIS — M5430 Sciatica, unspecified side: Secondary | ICD-10-CM | POA: Insufficient documentation

## 2024-02-15 DIAGNOSIS — Z23 Encounter for immunization: Secondary | ICD-10-CM | POA: Diagnosis not present

## 2024-02-15 DIAGNOSIS — I10 Essential (primary) hypertension: Secondary | ICD-10-CM

## 2024-02-15 DIAGNOSIS — E7800 Pure hypercholesterolemia, unspecified: Secondary | ICD-10-CM | POA: Diagnosis not present

## 2024-02-15 DIAGNOSIS — Z0000 Encounter for general adult medical examination without abnormal findings: Secondary | ICD-10-CM

## 2024-02-15 DIAGNOSIS — L509 Urticaria, unspecified: Secondary | ICD-10-CM | POA: Insufficient documentation

## 2024-02-15 MED ORDER — TRAMADOL HCL 50 MG PO TABS: ORAL_TABLET | ORAL | 3 refills | Status: AC

## 2024-02-15 NOTE — Progress Notes (Signed)
 Patient ID: Lynn Barnes, female   DOB: 1960-05-12, 64 y.o.   MRN: [id number]         Chief Complaint:: wellness exam and Annual Exam   Chronic pain, left ear cerumen impaction, htn, hld       HPI:  Lynn Barnes is a 64 y.o. female here for wellness exam; declines all vaccinations except due for prevnar 20, o/w up to date                        Also Pt denies chest pain, increased sob or doe, wheezing, orthopnea, PND, increased LE swelling, palpitations, dizziness or syncope.   Pt denies polydipsia, polyuria, or new focal neuro s/s.    Pt denies fever, wt loss, night sweats, loss of appetite, or other constitutional symptoms  Does have mild reduced hearing left ear and plans to use debrox at home for wax removal..  Pt continues to have recurring LBP  without change in severity, bowel or bladder change, fever, wt loss,  worsening LE pain/numbness/weakness, gait change or falls.  Also now with chronic distal RLE pain 6 mo after unfortunate trip and fall with Tib Fib fracture severe s/p ORIF.  Gained 10 lbs with being less active.  Sees Endo for DM, declines other lab as has A1c scheduled soon.      Wt Readings from Last 3 Encounters:  02/15/24 170 lb (77.1 kg)  09/21/23 160 lb 15 oz (73 kg)  09/20/23 163 lb (73.9 kg)   BP Readings from Last 3 Encounters:  02/15/24 122/84  09/22/23 (!) 150/69  09/20/23 (!) 153/81   Immunization History  Administered Date(s) Administered   Fluzone Influenza virus vaccine,trivalent (IIV3), split virus 12/05/2011   Influenza Inj Mdck Quad Pf 10/29/2018, 10/29/2018   Influenza Split 11/11/2007, 04/24/2008, 11/05/2015, 11/13/2017   Influenza Whole 11/11/2007, 04/24/2008   Influenza, Seasonal, Injecte, Preservative Fre 11/10/2012, 10/10/2022   Influenza,inj,Quad PF,6+ Mos 09/27/2013, 10/11/2019, 10/16/2020, 11/06/2021, 11/21/2023   Influenza-Unspecified 11/10/2012, 09/27/2013, 11/13/2017, 10/29/2018, 10/12/2019   Moderna Sars-Covid-2 Vaccination  05/09/2019, 06/11/2019, 11/11/2019, 08/31/2020   PNEUMOCOCCAL CONJUGATE-20 02/15/2024   Pneumococcal Conjugate-13 08/21/2016   Pneumococcal Polysaccharide-23 08/24/2017   Pneumococcal-Unspecified 08/24/2017   Health Maintenance Due  Topic Date Due   DTaP/Tdap/Td (1 - Tdap) Never done   Zoster Vaccines- Shingrix (1 of 2) Never done   Cervical Cancer Screening (HPV/Pap Cotest)  09/06/2010   Diabetic kidney evaluation - Urine ACR  02/28/2017   HEMOGLOBIN A1C  04/10/2023   COVID-19 Vaccine (5 - 2025-26 season) 10/12/2023      Past Medical History:  Diagnosis Date   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    Addiction, opium  (HCC)    Allergy    spring   Anxiety    ADHD CHRONIC ANXIETY   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 (HCC)    Bipolar disorder (HCC) 02/15/2011   Cervical spondylosis without myelopathy    Depression    Depression    Diabetes mellitus    sees Dr Altheimer/endo   Dysautonomia (HCC) 10/24/2011   Dyskinesia     subacute,due to drugs   Dyslipidemia    History of nuclear stress test    Myoview 11/16: EF 81%, no ischemia; Low Risk   Hyperlipidemia    IBS (irritable bowel syndrome) 02/15/2011   Insomnia 02/21/2011   Multinodular thyroid     with large dominant solid nodule with calcifications in the lower left pole benign by FNA in 1/12 and stable  by repeat ulltrasound in 4/13   Neuropathy    with paresthesias in feet   Orthostatic hypotension    Orthostatic hypotension    Parkinsonism (HCC)    due to prolonged prozac/risperdl for yrs/Dr Dohmeir   Parkinsonism (HCC)    PONV (postoperative nausea and vomiting)    POTS (postural orthostatic tachycardia syndrome)    Radiculopathy    cervical   Resting tremor    Rotator cuff syndrome of left shoulder    S/P partial hysterectomy    Spondylosis without myelopathy    Subacute dyskinesia due to drugs(333.85)    dr dohmeier/neuro   Tachycardia    syndrome   Therapy    on therapy by psychiatrist using sebutex     Thyroid  nodule    s/p biopsy - benign approx jan 2012   Tremor    Past Surgical History:  Procedure Laterality Date   ACROMIOPLASTY Left 2002   Dr Heide   ARTHROSCOPY, ANKLE WITH DEBRIDEMENT Right 09/22/2023   Procedure: ARTHROSCOPY, ANKLE WITH DEBRIDEMENT;  Surgeon: Beverley Evalene BIRCH, MD;  Location: [facility];  Service: Orthopedics;  Laterality: Right;   BREAST EXCISIONAL BIOPSY Right    benign   BREAST LUMPECTOMY Right    herniated disc cervical spine     lower back  1992   OOPHORECTOMY     ORIF ANKLE FRACTURE Right 09/22/2023   Procedure: OPEN REDUCTION INTERNAL FIXATION (ORIF) ANKLE FRACTURE;  Surgeon: Beverley Evalene BIRCH, MD;  Location: [facility];  Service: Orthopedics;  Laterality: Right;   PARTIAL HYSTERECTOMY     SHOULDER SURGERY Left    SPINE SURGERY  03/2010   c5-6 fusion, Dr Burnetta   THYROID  LOBECTOMY  12/2009   nodule biopsy, benign    reports that she has never smoked. She has never used smokeless tobacco. She reports that she does not drink alcohol and does not use drugs. family history includes ALS in her paternal grandmother; Heart disease in her father and paternal grandmother; Hypertension in her father and sister; Lung cancer in her mother; Pneumonia in her father. Allergies[1] Medications Ordered Prior to Encounter[2]      ROS:  All others reviewed and negative.  Objective        PE:  BP 122/84 (BP Location: Left Arm, Patient Position: Sitting, Cuff Size: Normal)   Pulse 80   Temp 98.1 F (36.7 C) (Oral)   Ht 5' 7 (1.702 m)   Wt 170 lb (77.1 kg)   SpO2 98%   BMI 26.63 kg/m                 Constitutional: Pt appears in NAD               HENT: Head: NCAT.                Right Ear: External ear normal.                 Left Ear: External ear normal.                Eyes: . Pupils are equal, round, and reactive to light. Conjunctivae and EOM are normal               Nose: without d/c or deformity               Neck: Neck  supple. Gross normal ROM               Cardiovascular: Normal  rate and regular rhythm.                 Pulmonary/Chest: Effort normal and breath sounds without rales or wheezing.                Abd:  Soft, NT, ND, + BS, no organomegaly               Neurological: Pt is alert. At baseline orientation, motor grossly intact               Skin: Skin is warm. No rashes, no other new lesions, LE edema - trace RLE               Psychiatric: Pt behavior is normal without agitation   Micro: none  Cardiac tracings I have personally interpreted today:  none  Pertinent Radiological findings (summarize): none   Lab Results  Component Value Date   WBC 6.4 10/10/2022   HGB 13.4 10/10/2022   HCT 39.8 10/10/2022   PLT 315.0 10/10/2022   GLUCOSE 373 (H) 09/21/2023   CHOL 126 10/10/2022   TRIG 70.0 10/10/2022   HDL 46.60 10/10/2022   LDLCALC 66 10/10/2022   ALT 20 10/10/2022   AST 26 10/10/2022   NA 137 09/21/2023   K 4.9 09/21/2023   CL 102 09/21/2023   CREATININE 0.99 09/21/2023   BUN 22 09/21/2023   CO2 26 09/21/2023   TSH 1.42 10/10/2022   HGBA1C 7.9 (H) 10/10/2022   Assessment/Plan:  Lynn Barnes is a 65 y.o. 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), Bipolar disorder (HCC) (02/15/2011), Cervical spondylosis without myelopathy, Depression, Depression, Diabetes mellitus, Dysautonomia (HCC) (10/24/2011), Dyskinesia, Dyslipidemia, History of nuclear stress test, Hyperlipidemia, IBS (irritable bowel syndrome) (02/15/2011), Insomnia (02/21/2011), Multinodular thyroid , Neuropathy, Orthostatic hypotension, Orthostatic hypotension, Parkinsonism (HCC), Parkinsonism (HCC), PONV (postoperative nausea and vomiting), POTS (postural orthostatic tachycardia syndrome), Radiculopathy, Resting tremor, Rotator cuff syndrome of left shoulder, S/P partial hysterectomy,  Spondylosis without myelopathy, Subacute dyskinesia due to drugs(333.85), Tachycardia, Therapy, Thyroid  nodule, and Tremor.  Preventative health care Age and sex appropriate education and counseling updated with regular exercise and diet Referrals for preventative services - none needed Immunizations addressed - for prevnar 20 today Smoking counseling  - none needed Evidence for depression or other mood disorder - stable anxiety bipolar Most recent labs reviewed. I have personally reviewed and have noted: 1) the patient's medical and social history 2) The patient's current medications and supplements 3) The patient's height, weight, and BMI have been recorded in the chart   Hyperlipidemia Lab Results  Component Value Date   LDLCALC 66 10/10/2022   Stable, pt to continue current statin lipitro 40 mg every day, zetia  10 mg qd   HTN (hypertension) BP Readings from Last 3 Encounters:  02/15/24 122/84  09/22/23 (!) 150/69  09/20/23 (!) 153/81   Stable, pt to continue medical treatment lisinopril  20 qd  Followup: Return in about 6 months (around 08/14/2024).  [name], MD 02/15/2024 8:47 PM [facility]     [1]  Allergies Allergen Reactions   Klonopin [Clonazepam] Other (See Comments)    Severe weakness   Nsaids Other (See Comments)    Irritates stomach   Sulfonamide Derivatives Other (See Comments)   Sulfa Antibiotics Rash  [2]  Current Outpatient Medications on File Prior  to Visit  Medication Sig Dispense Refill   acetaminophen  (TYLENOL ) 500 MG tablet Take 2 tablets (1,000 mg total) by mouth every 6 (six) hours as needed for mild pain (pain score 1-3) or moderate pain (pain score 4-6). 60 tablet 0   apixaban  (ELIQUIS ) 2.5 MG TABS tablet Take 1 tablet (2.5 mg total) by mouth 2 (two) times daily. To prevent blood clots after surgery 60 tablet 0   atorvastatin  (LIPITOR) 40 MG tablet TAKE ONE TABLET BY  MOUTH ONE TIME DAILY 90 tablet 3   baclofen  (LIORESAL ) 10 MG tablet Take 1 tablet (10 mg total) by mouth every 8 (eight) hours as needed for muscle spasms. MAY CAUSE DROWSINESS! 21 tablet 0   busPIRone (BUSPAR) 10 MG tablet Take 20 mg by mouth 2 (two) times daily.     carbidopa -levodopa  (SINEMET  CR) 50-200 MG tablet Take 1 tablet by mouth 2 (two) times daily. 180 tablet 1   ezetimibe  (ZETIA ) 10 MG tablet TAKE ONE TABLET BY MOUTH ONE TIME DAILY 90 tablet 3   fludrocortisone  (FLORINEF ) 0.1 MG tablet Take 0.5 tablets (50 mcg total) by mouth daily. 45 tablet 3   gabapentin  (NEURONTIN ) 400 MG capsule Take by mouth.     Glucagon 3 MG/DOSE POWD Place into the nose.     glucose blood test strip 1 each by Other route. 7 times per day   - as instructed     HUMALOG KWIKPEN 100 UNIT/ML KwikPen INJECT BEFORE EACH MEAL WITH 1 UNIT PER 10 GM CARBS PLUS CORRECTIN OF 1 UNIT PER 50 MG     insulin degludec (TRESIBA) 100 UNIT/ML FlexTouch Pen Inject 27 Units into the skin daily.      LATUDA 120 MG TABS Take 1 tablet by mouth at bedtime.     lisinopril  (ZESTRIL ) 20 MG tablet TAKE ONE TABLET BY MOUTH ONE TIME DAILY, TAKE AN EXTRA TABLET BY MOUTH DAILY IF FOR SYSTOLIC BLOOD PRESSURE OVER 165 95 tablet 1   ondansetron  (ZOFRAN -ODT) 4 MG disintegrating tablet Take 1 tablet (4 mg total) by mouth every 8 (eight) hours as needed for nausea or vomiting. 15 tablet 0   oxyCODONE -acetaminophen  (PERCOCET/ROXICET) 5-325 MG tablet Take 1 tablet by mouth every 6 (six) hours as needed for severe pain (pain score 7-10). 15 tablet 0   pindolol  (VISKEN ) 5 MG tablet TAKE ONE TABLET BY MOUTH TWICE A DAY 180 tablet 1   POTASSIUM CITRATE PO Take 1 capsule by mouth every other day.     risperidone  (RISPERDAL ) 4 MG tablet Take 8 mg by mouth at bedtime.  0   scopolamine  (TRANSDERM SCOP , 1.5 MG,) 1 MG/3DAYS Place 1 patch (1.5 mg total) onto the skin every 3 (three) days. 10 patch 12   trihexyphenidyl  (ARTANE ) 2 MG tablet Take 1 tablet (2 mg  total) by mouth 3 (three) times daily with meals. 90 tablet 11   zolpidem  (AMBIEN ) 10 MG tablet Take 10 mg by mouth at bedtime.  1   omeprazole  (PRILOSEC  OTC) 20 MG tablet Take 1 tablet (20 mg total) by mouth daily. For gastric protection 30 tablet 0   No current facility-administered medications on file prior to visit.

## 2024-02-15 NOTE — Patient Instructions (Signed)
 You had the prevnar 20 pneumonia shot today  Please continue all other medications as before, and refills have been done for the tramadol   Please have the pharmacy call with any other refills you may need.  Please continue your efforts at being more active, low cholesterol diet, and weight control.  You are otherwise up to date with prevention measures today.  Please keep your appointments with your specialists as you may have planned  Please make an Appointment to return in 6 months, or sooner if needed

## 2024-02-15 NOTE — Assessment & Plan Note (Signed)
 Lab Results  Component Value Date   LDLCALC 66 10/10/2022   Stable, pt to continue current statin lipitro 40 mg every day, zetia  10 mg qd

## 2024-02-15 NOTE — Assessment & Plan Note (Signed)
 Age and sex appropriate education and counseling updated with regular exercise and diet Referrals for preventative services - none needed Immunizations addressed - for prevnar 20 today Smoking counseling  - none needed Evidence for depression or other mood disorder - stable anxiety bipolar Most recent labs reviewed. I have personally reviewed and have noted: 1) the patient's medical and social history 2) The patient's current medications and supplements 3) The patient's height, weight, and BMI have been recorded in the chart

## 2024-02-15 NOTE — Assessment & Plan Note (Signed)
 BP Readings from Last 3 Encounters:  02/15/24 122/84  09/22/23 (!) 150/69  09/20/23 (!) 153/81   Stable, pt to continue medical treatment lisinopril  20 qd

## 2024-02-17 ENCOUNTER — Telehealth: Payer: Self-pay

## 2024-02-17 MED ORDER — LISINOPRIL 40 MG PO TABS: 40.0000 mg | ORAL_TABLET | Freq: Every day | ORAL | 3 refills | Status: AC

## 2024-02-17 NOTE — Telephone Encounter (Signed)
 Ok I have sent this script to [facility] pharmacy

## 2024-02-17 NOTE — Progress Notes (Signed)
 Pharmacy Quality Measure Review  This patient is appearing on a report for being at risk of failing the adherence measure for hypertension (ACEi/ARB) medications this calendar year.   Medication: Lisinopril  20mg  tablet: Take one tablet by mouth one time daily, take an extra tablet by mouth daily for SBP over 165.  Patient reports she has been taking two (2) tablets of Lisinopril  20mg  tablet and has been taking this medication this way for the past year. Monitors BP periodically with readings 120s/70s.   Last fill date: 01/12/24 for 95 day supply  Reviewed medication indication, dosing, and goals of therapy.  and Will collaborate with embedded pharmacist to adjust prescription to match how patient is taking

## 2024-03-04 LAB — HM MAMMOGRAPHY

## 2024-03-07 ENCOUNTER — Encounter: Payer: Self-pay | Admitting: Internal Medicine

## 2024-03-17 ENCOUNTER — Encounter: Payer: Self-pay | Admitting: *Deleted

## 2024-03-17 NOTE — Progress Notes (Signed)
 Lynn Barnes                                          MRN: [id number]   03/17/2024   The [facility] Quality Team Specialist reviewed this patient medical record for the purposes of chart review for care gap closure. The following were reviewed: chart review for care gap closure-controlling blood pressure.    [facility] Quality Team

## 2024-04-26 ENCOUNTER — Ambulatory Visit: Admitting: Internal Medicine

## 2024-09-05 ENCOUNTER — Ambulatory Visit: Admitting: Adult Health
